# Patient Record
Sex: Female | Born: 1960 | Race: White | Hispanic: No | Marital: Married | Smoking: Never smoker
Health system: Southern US, Community
[De-identification: ages and names within clinical notes are randomized; demographics above are authoritative.]

## PROBLEM LIST (undated history)

## (undated) DIAGNOSIS — C50919 Malignant neoplasm of unspecified site of unspecified female breast: Secondary | ICD-10-CM

## (undated) DIAGNOSIS — D649 Anemia, unspecified: Secondary | ICD-10-CM

## (undated) DIAGNOSIS — U071 COVID-19: Secondary | ICD-10-CM

## (undated) DIAGNOSIS — N6001 Solitary cyst of right breast: Secondary | ICD-10-CM

## (undated) DIAGNOSIS — R7303 Prediabetes: Secondary | ICD-10-CM

## (undated) DIAGNOSIS — E785 Hyperlipidemia, unspecified: Secondary | ICD-10-CM

## (undated) DIAGNOSIS — Z9221 Personal history of antineoplastic chemotherapy: Secondary | ICD-10-CM

## (undated) DIAGNOSIS — C50911 Malignant neoplasm of unspecified site of right female breast: Secondary | ICD-10-CM

## (undated) HISTORY — PX: DILATION AND CURETTAGE OF UTERUS: SHX78

## (undated) HISTORY — DX: Malignant neoplasm of unspecified site of unspecified female breast: C50.919

## (undated) HISTORY — DX: Anemia, unspecified: D64.9

## (undated) HISTORY — PX: ENDOMETRIAL ABLATION: SHX621

## (undated) HISTORY — DX: Solitary cyst of right breast: N60.01

## (undated) HISTORY — DX: Hyperlipidemia, unspecified: E78.5

## (undated) HISTORY — DX: Prediabetes: R73.03

---
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
## 2009-06-05 HISTORY — PX: COLONOSCOPY: SHX174

## 2009-09-03 HISTORY — PX: ENDOMETRIAL BIOPSY: SHX622

## 2009-10-15 ENCOUNTER — Ambulatory Visit: Payer: Self-pay | Admitting: Unknown Physician Specialty

## 2009-11-03 HISTORY — PX: BREAST CYST ASPIRATION: SHX578

## 2009-11-16 ENCOUNTER — Ambulatory Visit: Payer: Self-pay | Admitting: Unknown Physician Specialty

## 2009-11-23 ENCOUNTER — Ambulatory Visit: Payer: Self-pay | Admitting: Unknown Physician Specialty

## 2011-06-16 ENCOUNTER — Ambulatory Visit: Payer: Self-pay | Admitting: Unknown Physician Specialty

## 2015-01-08 ENCOUNTER — Other Ambulatory Visit: Payer: Self-pay | Admitting: Obstetrics and Gynecology

## 2015-01-08 DIAGNOSIS — N63 Unspecified lump in unspecified breast: Secondary | ICD-10-CM

## 2015-01-19 ENCOUNTER — Ambulatory Visit: Admission: RE | Admit: 2015-01-19 | Payer: Self-pay | Source: Ambulatory Visit

## 2015-01-19 ENCOUNTER — Ambulatory Visit
Admission: RE | Admit: 2015-01-19 | Discharge: 2015-01-19 | Disposition: A | Discharge status: Home / Self Care | Payer: 59 | Source: Ambulatory Visit | Attending: Obstetrics and Gynecology | Admitting: Obstetrics and Gynecology

## 2015-01-19 DIAGNOSIS — N6001 Solitary cyst of right breast: Secondary | ICD-10-CM | POA: Insufficient documentation

## 2015-01-19 DIAGNOSIS — N63 Unspecified lump in unspecified breast: Secondary | ICD-10-CM

## 2017-03-14 ENCOUNTER — Ambulatory Visit (INDEPENDENT_AMBULATORY_CARE_PROVIDER_SITE_OTHER): Payer: 59 | Admitting: Obstetrics and Gynecology

## 2017-03-14 ENCOUNTER — Encounter: Payer: Self-pay | Admitting: Obstetrics and Gynecology

## 2017-03-14 VITALS — BP 100/62 | HR 72 | Ht 63.5 in | Wt 194.0 lb

## 2017-03-14 DIAGNOSIS — E7800 Pure hypercholesterolemia, unspecified: Secondary | ICD-10-CM | POA: Insufficient documentation

## 2017-03-14 DIAGNOSIS — Z713 Dietary counseling and surveillance: Secondary | ICD-10-CM | POA: Diagnosis not present

## 2017-03-14 DIAGNOSIS — N816 Rectocele: Secondary | ICD-10-CM | POA: Diagnosis not present

## 2017-03-14 DIAGNOSIS — N6311 Unspecified lump in the right breast, upper outer quadrant: Secondary | ICD-10-CM

## 2017-03-14 DIAGNOSIS — N6001 Solitary cyst of right breast: Secondary | ICD-10-CM

## 2017-03-14 DIAGNOSIS — Z01419 Encounter for gynecological examination (general) (routine) without abnormal findings: Secondary | ICD-10-CM

## 2017-03-14 DIAGNOSIS — R7303 Prediabetes: Secondary | ICD-10-CM | POA: Insufficient documentation

## 2017-03-14 DIAGNOSIS — N8110 Cystocele, unspecified: Secondary | ICD-10-CM

## 2017-03-14 DIAGNOSIS — D649 Anemia, unspecified: Secondary | ICD-10-CM | POA: Insufficient documentation

## 2017-03-14 NOTE — Progress Notes (Signed)
PCP: Patient, No Pcp Per   Chief Complaint  Patient presents with  . Gynecologic Exam    R Breast cyst has grown/Wellness Screening appeal form  . Gynecologic Exam    Last pap 02/22/16 Normal    HPI:      Ms. Ashley Molina is a 56 y.o. No obstetric history on file. who LMP was No LMP recorded. Patient is postmenopausal., presents today for her annual examination.  Her menses are absent due to menopause. She does not have intermenstrual bleeding. She does not have vasomotor sx.   Sex activity: not active. She does not have vaginal dryness.  Last Pap: February 22, 2016  Results were: no abnormalities /neg HPV DNA.   Last mammogram: January 19, 2015  Results were: normal--routine follow-up in 12 months. She has a hx of RT breast cyst that has been aspirated in the past with Dr. Bary Castilla. Cyst has gotten bigger in the past yr and sometimes aches.   There is no FH of breast cancer. There is no FH of ovarian cancer. The patient does do self-breast exams.  Colonoscopy: colonoscopy 7 years ago without abnormalities. . Repeat due after 10 years.   Tobacco use: The patient denies current or previous tobacco use. Alcohol use: none Exercise: moderately active  She does get adequate calcium and Vitamin D in her diet.  She has gained 12 # since last appt and needs [facility] BMI attestation form completed. Pt walks at least 10,000 steps daily and journals her food regularly with wt watchers. She travels all the time for work which makes it hard to eat healthy.   Labs through work.    Past Medical History:  Diagnosis Date  . Anemia   . Breast cyst, right    aspirated by Dr. Bary Castilla  . Hyperlipidemia   . Pre-diabetes     Past Surgical History:  Procedure Laterality Date  . BREAST CYST ASPIRATION Right 2011   Dr. Bary Castilla did FNA  . COLONOSCOPY  2011  . DILATION AND CURETTAGE OF UTERUS     X3  . ENDOMETRIAL ABLATION    . ENDOMETRIAL BIOPSY  09/2009    Family History    Problem Relation Age of Onset  . Melanoma Maternal Grandmother 30  . Brain cancer Maternal Grandfather 65  . Melanoma Other 82    Social History   Social History  . Marital status: Married    Spouse name: N/A  . Number of children: N/A  . Years of education: N/A   Occupational History  . Not on file.   Social History Main Topics  . Smoking status: Never Smoker  . Smokeless tobacco: Never Used  . Alcohol use 1.2 oz/week    2 Glasses of wine per week  . Drug use: No  . Sexual activity: Not Currently   Other Topics Concern  . Not on file   Social History Narrative  . No narrative on file    Current Meds  Medication Sig  . Cholecalciferol (VITAMIN D3) 10000 units TABS Take by mouth 1 day or 1 dose.  . Multiple Vitamin (MULTIVITAMIN) tablet Take 1 tablet by mouth daily.  Marland Kitchen omega-3 acid ethyl esters (LOVAZA) 1 g capsule Take by mouth 1 day or 1 dose.      ROS:  Review of Systems  Constitutional: Negative for fatigue, fever and unexpected weight change.  Respiratory: Negative for cough, shortness of breath and wheezing.   Cardiovascular: Negative for chest pain, palpitations and leg swelling.  Gastrointestinal: Negative for blood in stool, constipation, diarrhea, nausea and vomiting.  Endocrine: Negative for cold intolerance, heat intolerance and polyuria.  Genitourinary: Negative for dyspareunia, dysuria, flank pain, frequency, genital sores, hematuria, menstrual problem, pelvic pain, urgency, vaginal bleeding, vaginal discharge and vaginal pain.  Musculoskeletal: Negative for back pain, joint swelling and myalgias.  Skin: Negative for rash.  Neurological: Negative for dizziness, syncope, light-headedness, numbness and headaches.  Hematological: Negative for adenopathy.  Psychiatric/Behavioral: Negative for agitation, confusion, sleep disturbance and suicidal ideas. The patient is not nervous/anxious.      Objective: BP 100/62 (BP Location: Left Arm, Patient  Position: Sitting, Cuff Size: Normal)   Pulse 72   Ht 5' 3.5" (1.613 m)   Wt 194 lb (88 kg)   BMI 33.83 kg/m    Physical Exam  Constitutional: She is oriented to person, place, and time. She appears well-developed and well-nourished.  Genitourinary: Vagina normal and uterus normal. There is no rash or tenderness on the right labia. There is no rash or tenderness on the left labia. No erythema or tenderness in the vagina. No vaginal discharge found. Right adnexum does not display mass and does not display tenderness. Left adnexum does not display mass and does not display tenderness. Cervix does not exhibit motion tenderness or polyp. Uterus is not enlarged or tender.  Genitourinary Comments: GRADE 2 CYSTOCELE VALSALVA; GRADE 1 WITHOUT VALSALVA; GRADE 1 RECTOCELE  Neck: Normal range of motion. No thyromegaly present.  Cardiovascular: Normal rate, regular rhythm and normal heart sounds.   No murmur heard. Pulmonary/Chest: Effort normal and breath sounds normal. Right breast exhibits mass. Right breast exhibits no nipple discharge, no skin change and no tenderness. Left breast exhibits no mass, no nipple discharge, no skin change and no tenderness.    RT BREAST 9:00-1:00 WITH LARGE, FIRM, NT MASS (AREA OF BREAST CYST PER PT REPORT); ~6 X 8 CM  Abdominal: Soft. There is no tenderness. There is no guarding.  Musculoskeletal: Normal range of motion.  Neurological: She is alert and oriented to person, place, and time. No cranial nerve deficit.  Psychiatric: She has a normal mood and affect. Her behavior is normal.  Vitals reviewed.   Assessment/Plan:  Encounter for annual routine gynecological examination  Solitary cyst of breast, right - Given size and change per pt, check dx mammo and u/s. REfer back to Dr. Bary Castilla for further eval/mgmt. - Plan: US BREAST LTD UNI RIGHT INC AXILLA, MM DIAG BREAST TOMO BILATERAL, Ambulatory referral to General Surgery  Mass of upper outer quadrant of right  breast - Plan: US BREAST LTD UNI RIGHT INC AXILLA, MM DIAG BREAST TOMO BILATERAL, Ambulatory referral to General Surgery  Weight loss counseling, encounter for - Increase exercise intensity/MyFitness Pal/40 g carbs daily. [facility] form completed. F/u prn.   Cystocele with rectocele - No urin/rectal sx. Not bothersome to pt. F/u prn.          GYN counsel breast self exam, menopause, adequate intake of calcium and vitamin D, diet and exercise    F/U  Return in about 1 year (around 03/14/2018).  Alicia B. Copland, PA-C 03/14/2017 3:02 PM

## 2017-03-15 ENCOUNTER — Encounter: Payer: Self-pay | Admitting: *Deleted

## 2017-03-15 ENCOUNTER — Telehealth: Payer: Self-pay | Admitting: General Surgery

## 2017-03-15 ENCOUNTER — Telehealth: Payer: Self-pay | Admitting: Obstetrics and Gynecology

## 2017-03-15 NOTE — Telephone Encounter (Signed)
Patient is aware of appointment at [facility] on Tuesday, 03/20/17 @ 2:20pm but will be out of town and was given [place]'s phone# to reschedule. Patient is aware that Dr Dwyane Luo office will contact her directly to schedule the referral appointment.

## 2017-03-15 NOTE — Telephone Encounter (Signed)
I SPOKE TO PATIENT ABOUT SCHEDULING AN APPOINTMENT WITH DR BYRNETT (OLD PT 11-29-09)FOR RT BR CYST UOQ.CURRENTLY SCHEDULED FOR MAMMO& U/S [id number].(FRT'M BY ALICIA COPLAND/DR ROBERT HARRIS) THE PATIENT IS GOING OUT OF TOWN & NEEDS TO RESCHEDULE MAMMO.SHE WILL CALL us BACK TO SCHEDULE APPOINTMENT WITH DR BYRNETT.

## 2017-03-20 ENCOUNTER — Other Ambulatory Visit: Payer: 59

## 2017-03-23 ENCOUNTER — Ambulatory Visit
Admission: RE | Admit: 2017-03-23 | Discharge: 2017-03-23 | Disposition: A | Discharge status: Home / Self Care | Payer: 59 | Source: Ambulatory Visit | Attending: Obstetrics and Gynecology | Admitting: Obstetrics and Gynecology

## 2017-03-23 ENCOUNTER — Other Ambulatory Visit: Payer: Self-pay | Admitting: Obstetrics and Gynecology

## 2017-03-23 DIAGNOSIS — N6001 Solitary cyst of right breast: Secondary | ICD-10-CM

## 2017-03-23 DIAGNOSIS — N6311 Unspecified lump in the right breast, upper outer quadrant: Secondary | ICD-10-CM

## 2017-03-26 ENCOUNTER — Telehealth: Payer: Self-pay | Admitting: Obstetrics and Gynecology

## 2017-03-26 ENCOUNTER — Other Ambulatory Visit: Payer: Self-pay | Admitting: Obstetrics and Gynecology

## 2017-03-26 DIAGNOSIS — N6310 Unspecified lump in the right breast, unspecified quadrant: Secondary | ICD-10-CM

## 2017-03-26 DIAGNOSIS — R928 Other abnormal and inconclusive findings on diagnostic imaging of breast: Secondary | ICD-10-CM

## 2017-03-26 NOTE — Telephone Encounter (Signed)
Ok

## 2017-03-26 NOTE — Telephone Encounter (Signed)
Pt is calling back to confirm appointment with Dr. Bary Castilla tomorrow 03/27/17

## 2017-03-26 NOTE — Telephone Encounter (Signed)
[place]. Pt has Birads 5 mammo. Originally had appt with Dr. Bary Castilla 10/29 but can be seen 10/23 instead. Radiologist already told pt of need for bx.

## 2017-03-27 ENCOUNTER — Other Ambulatory Visit: Payer: Self-pay | Admitting: General Surgery

## 2017-03-27 ENCOUNTER — Inpatient Hospital Stay: Payer: Self-pay

## 2017-03-27 ENCOUNTER — Encounter: Payer: Self-pay | Admitting: General Surgery

## 2017-03-27 ENCOUNTER — Ambulatory Visit (INDEPENDENT_AMBULATORY_CARE_PROVIDER_SITE_OTHER): Payer: 59 | Admitting: General Surgery

## 2017-03-27 VITALS — BP 104/60 | HR 65 | Resp 12 | Ht 63.0 in | Wt 197.0 lb

## 2017-03-27 DIAGNOSIS — N6311 Unspecified lump in the right breast, upper outer quadrant: Secondary | ICD-10-CM

## 2017-03-27 HISTORY — PX: BREAST BIOPSY: SHX20

## 2017-03-27 NOTE — Progress Notes (Signed)
Patient ID: [name], female   DOB: 1960-07-14, 56 y.o.   MRN: [id number]  Chief Complaint  Patient presents with  . Other    HPI Ashley Molina is a 56 y.o. female who presents for a breast evaluation. The most recent mammogram was done on 03/23/2017 . She had bilateral breast ultrasound 03-23-17. Patient does perform regular self breast checks and gets regular mammograms done. She did not have her mammogram last year.  She states she had a known cyst in the right breast there for several years that Ashley Molina has been watching. She states it has gotten larger over the summer.Denies  discomfort. She states she can see the area where the lump is for about 2 months. Cyst aspiration right breast was June 2011. She works for [employer].  HPI  Past Medical History:  Diagnosis Date  . Anemia   . Breast cyst, right    aspirated by Dr. Bary Castilla  . Hyperlipidemia   . Pre-diabetes     Past Surgical History:  Procedure Laterality Date  . BREAST CYST ASPIRATION Right 11/2009   Dr. Bary Castilla did FNA  . COLONOSCOPY  2011  . DILATION AND CURETTAGE OF UTERUS     X3  . ENDOMETRIAL ABLATION    . ENDOMETRIAL BIOPSY  09/2009    Family History  Problem Relation Age of Onset  . Melanoma Maternal Grandmother 13  . Brain cancer Maternal Grandfather 39  . Melanoma Other 58    Social History Social History  Substance Use Topics  . Smoking status: Never Smoker  . Smokeless tobacco: Never Used  . Alcohol use 1.2 oz/week    2 Glasses of wine per week    Allergies  Allergen Reactions  . Penicillins Rash    Current Outpatient Prescriptions  Medication Sig Dispense Refill  . Cholecalciferol (VITAMIN D3) 10000 units TABS Take by mouth 1 day or 1 dose.    . omega-3 acid ethyl esters (LOVAZA) 1 g capsule Take by mouth 1 day or 1 dose.    . vitamin E 400 UNIT capsule Take 400 Units by mouth daily.     No current facility-administered medications for this visit.     Review  of Systems Review of Systems  Constitutional: Negative.   Cardiovascular: Negative.     Blood pressure 104/60, pulse 65, resp. rate 12, height 5' 3"  (1.6 m), weight 197 lb (89.4 kg).  Physical Exam Physical Exam  Constitutional: She is oriented to person, place, and time. She appears well-developed and well-nourished.  HENT:  Mouth/Throat: Oropharynx is clear and moist.  Eyes: Conjunctivae are normal. No scleral icterus.  Neck: Neck supple.  Cardiovascular: Normal rate, regular rhythm and normal heart sounds.   Pulmonary/Chest: Effort normal and breath sounds normal. Right breast exhibits mass. Right breast exhibits no inverted nipple, no nipple discharge, no skin change and no tenderness. Left breast exhibits no inverted nipple, no mass, no nipple discharge, no skin change and no tenderness.    6-7 cm mass right breast UOQ  Lymphadenopathy:    She has no cervical adenopathy.    She has axillary adenopathy.       Right: No supraclavicular adenopathy present.  Right axillary node  Neurological: She is alert and oriented to person, place, and time.  Skin: Skin is warm and dry.  Psychiatric: Her behavior is normal.    Data Reviewed 03/23/2017 mammogram and ultrasound reviewed. Comparison to 01/19/2015 studies completed.  New dominant mass in the upper-outer quadrant of  the right breast with a stable breast cyst. Lymphadenopathy. BIRAD-5.  Indications for biopsy of both the dominant mass in the right breast as well as the enlarged axillary lymph node were reviewed with the patient and she was amenable to proceed.  Examination of the axilla showed at least 1 dominant lymph node measuring up to 2.16 cm in diameter. The area was infiltrated with 10 mL of 0.5% Xylocaine with 0.25% Marcaine with 1 200,000 of epinephrine. A 14-gauge spring-loaded core biopsy device was used and 3 core samples were obtained. A postbiopsy clip was placed. This was completed in a transverse orientation from  posterior to anterior. No bleeding noted.  The right breast mass is a spiculated hypoechoic mass measuring at least 3.69 x 3.74 x 7.2 cm.  The patient received an additional 10 mL of 0.5% Xylocaine with 0.25% Marcaine with 1-200,000 epinephrine. Again using a 14-gauge core biopsy device, and using a lateral to medial approach 4 core samples were obtained after skin incision with an 11-gauge blade through various locations in the lesion. A postbiopsy clip was placed. There was about 10 mL of bleeding that was controlled with direct pressure. Both skin defects were closed with benzoin and Steri-Strips. Telfa and Tegaderm dressings applied.  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 Likely nodal disease.    Plan    Indication for medical oncology evaluation and likely recommendation for neoadjuvant chemotherapy reviewed.  Baseline laboratories will be obtained first thing in the morning.  The patient will be contacted by phone when pathology results are available.  Postbiopsy wound care was reviewed.     Follow up pending pathology. Appointment with medical oncology  CBC, Comp Met Panel and Ca 27.29 to be drawn today at [facility]   HPI, Physical Exam, Assessment and Plan have been scribed under the direction and in the presence of Robert Bellow, MD. Karie Fetch, RN  I have completed the exam and reviewed the above documentation for accuracy and completeness.  I agree with the above.  Haematologist has been used and any errors in dictation or transcription are unintentional.  Hervey Ard, M.D., F.A.C.S.  Robert Bellow 03/27/2017, 7:29 PM

## 2017-03-27 NOTE — Patient Instructions (Signed)

## 2017-03-29 LAB — PATHOLOGY

## 2017-03-29 LAB — CBC WITH DIFFERENTIAL/PLATELET
BASOS: 1 %
Basophils Absolute: 0.1 10*3/uL (ref 0.0–0.2)
EOS (ABSOLUTE): 0.1 10*3/uL (ref 0.0–0.4)
EOS: 2 %
HEMATOCRIT: 38.2 % (ref 34.0–46.6)
Hemoglobin: 12.9 g/dL (ref 11.1–15.9)
Immature Grans (Abs): 0 10*3/uL (ref 0.0–0.1)
Immature Granulocytes: 0 %
LYMPHS ABS: 1.1 10*3/uL (ref 0.7–3.1)
Lymphs: 24 %
MCH: 27.3 pg (ref 26.6–33.0)
MCHC: 33.8 g/dL (ref 31.5–35.7)
MCV: 81 fL (ref 79–97)
MONOS ABS: 0.4 10*3/uL (ref 0.1–0.9)
Monocytes: 10 %
Neutrophils Absolute: 2.7 10*3/uL (ref 1.4–7.0)
Neutrophils: 63 %
Platelets: 245 10*3/uL (ref 150–379)
RBC: 4.72 x10E6/uL (ref 3.77–5.28)
RDW: 14.3 % (ref 12.3–15.4)
WBC: 4.3 10*3/uL (ref 3.4–10.8)

## 2017-03-29 LAB — COMPREHENSIVE METABOLIC PANEL
A/G RATIO: 1.7 (ref 1.2–2.2)
ALK PHOS: 89 IU/L (ref 39–117)
ALT: 22 IU/L (ref 0–32)
AST: 19 IU/L (ref 0–40)
Albumin: 4.3 g/dL (ref 3.5–5.5)
BUN/Creatinine Ratio: 18 (ref 9–23)
BUN: 15 mg/dL (ref 6–24)
Bilirubin Total: 0.4 mg/dL (ref 0.0–1.2)
CO2: 22 mmol/L (ref 20–29)
Calcium: 9.3 mg/dL (ref 8.7–10.2)
Chloride: 106 mmol/L (ref 96–106)
Creatinine, Ser: 0.82 mg/dL (ref 0.57–1.00)
GFR calc Af Amer: 92 mL/min/{1.73_m2} (ref >59)
GFR calc non Af Amer: 80 mL/min/{1.73_m2} (ref >59)
GLOBULIN, TOTAL: 2.6 g/dL (ref 1.5–4.5)
Glucose: 102 mg/dL — ABNORMAL HIGH (ref 65–99)
POTASSIUM: 4.1 mmol/L (ref 3.5–5.2)
SODIUM: 140 mmol/L (ref 134–144)
Total Protein: 6.9 g/dL (ref 6.0–8.5)

## 2017-03-29 LAB — CANCER ANTIGEN 27.29: CAN 27.29: 15.8 U/mL (ref 0.0–38.6)

## 2017-03-30 ENCOUNTER — Telehealth: Payer: Self-pay | Admitting: *Deleted

## 2017-03-30 NOTE — Telephone Encounter (Signed)
Returning your phone call.

## 2017-04-01 ENCOUNTER — Telehealth: Payer: Self-pay | Admitting: General Surgery

## 2017-04-02 ENCOUNTER — Ambulatory Visit: Payer: 59 | Admitting: General Surgery

## 2017-04-02 NOTE — Telephone Encounter (Signed)
Message left with call back number.

## 2017-04-02 NOTE — Progress Notes (Signed)
Hematology/Oncology Consult note [facility] Telephone:([phone] [phone]  Patient Care Team: Patient, No Pcp Per as PCP - General (General Practice) Byrnett, Forest Gleason, MD (General Surgery) Gae Dry, MD as Referring Physician (Obstetrics and Gynecology)   Name of the patient: Ashley Molina  [id number]  10/28/1960    Reason for referral- breast mass   Referring physician- Dr. Bary Castilla  Date of visit: 04/02/17   History of presenting illness- 1.  Patient is a 56 year old postmenopausal female who had a routine screening mammogram on 03/23/2017 which showed a highly suspicious palpable solid mass in the 10 o'clock position.  Mass measures at least 5 cm in maximum diameter for right axillary lymph nodes have sonographic features suspicious for metastatic disease.  No evidence of malignancy was noted in the left breast.  This was followed by an ultrasound of the right breast including the right axilla which confirmed the same findings.Targeted ultrasound is performed, showing a markedly irregular and hypoechoic mass centered at 10 o'clock position approximately 7 cm from the nipple estimated to be at least 5.2 x 4.4 x 3.4 cm. Small areas of vascular flow are detected within the mass. At 10 o'clock position 9 cm from the nipple again noted is a simple cyst measuring approximately 2.4 cm greatest diameter. Skin thickness of the medial and lateral of right breast measures approximately 4 mm.  Ultrasound of the right axilla demonstrates 4 suspicious lymph nodes with hypoechoic thickened cortices. The largest of these lymph nodes measures 2.2 x 1.2 x 1.0 cm and has a cortical thickness of 4-5 mm.  Patient underwent core biopsy of the right breast mass.  2.  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  Right axillary lymph node biopsy was also consistent with metastatic carcinoma.  ER PR and HER-2 status is currently pending.  Patient has  been seen by Dr. Bary Castilla and has been referred to [place] for consideration of adjuvant chemotherapy  3. Patient is G3P3L3. Menarche at the age of 23. Menopause at 30. No priro h/o breast biopsies. No family h/o breast cancer. She was known to have right breast cyst being followed by Dr. Bary Castilla. She noticed this amss sometime over the summer and feels this has grown since then. She is healthy and has no significant comorbidities  ECOG PS- 0  Pain scale- 0   Review of systems- Review of Systems  Constitutional: Negative for chills, fever, malaise/fatigue and weight loss.  HENT: Negative for congestion, ear discharge and nosebleeds.   Eyes: Negative for blurred vision.  Respiratory: Negative for cough, hemoptysis, sputum production, shortness of breath and wheezing.   Cardiovascular: Negative for chest pain, palpitations, orthopnea and claudication.  Gastrointestinal: Negative for abdominal pain, blood in stool, constipation, diarrhea, heartburn, melena, nausea and vomiting.  Genitourinary: Negative for dysuria, flank pain, frequency, hematuria and urgency.  Musculoskeletal: Negative for back pain, joint pain and myalgias.  Skin: Negative for rash.  Neurological: Negative for dizziness, tingling, focal weakness, seizures, weakness and headaches.  Endo/Heme/Allergies: Does not bruise/bleed easily.  Psychiatric/Behavioral: Negative for depression and suicidal ideas. The patient does not have insomnia.     Allergies  Allergen Reactions  . Penicillins Rash    Patient Active Problem List   Diagnosis Date Noted  . Anemia 03/14/2017  . Solitary cyst of breast, right 03/14/2017  . Pre-diabetes 03/14/2017  . Hypercholesterolemia 03/14/2017  . Mass of upper outer quadrant of right breast 03/14/2017     Past Medical History:  Diagnosis  Date  . Anemia   . Breast cyst, right    aspirated by Dr. Bary Castilla  . Hyperlipidemia   . Pre-diabetes      Past Surgical History:  Procedure Laterality  Date  . BREAST CYST ASPIRATION Right 11/2009   Dr. Bary Castilla did FNA  . COLONOSCOPY  2011  . DILATION AND CURETTAGE OF UTERUS     X3  . ENDOMETRIAL ABLATION    . ENDOMETRIAL BIOPSY  09/2009    Social History   Social History  . Marital status: Married    Spouse name: N/A  . Number of children: N/A  . Years of education: N/A   Occupational History  . Not on file.   Social History Main Topics  . Smoking status: Never Smoker  . Smokeless tobacco: Never Used  . Alcohol use 1.2 oz/week    2 Glasses of wine per week  . Drug use: No  . Sexual activity: Not Currently   Other Topics Concern  . Not on file   Social History Narrative  . No narrative on file     Family History  Problem Relation Age of Onset  . Melanoma Maternal Grandmother 14  . Brain cancer Maternal Grandfather 27  . Melanoma Other 90     Current Outpatient Prescriptions:  .  Biotin 2500 MCG CAPS, Take 5,000 mcg/day by mouth daily., Disp: , Rfl:  .  Cholecalciferol (VITAMIN D3) 10000 units TABS, Take by mouth 1 day or 1 dose., Disp: , Rfl:  .  Multiple Vitamins-Minerals (EMERGEN-C IMMUNE PLUS PO), Take 1 packet by mouth 1 day or 1 dose. With energy, Disp: , Rfl:  .  omega-3 acid ethyl esters (LOVAZA) 1 g capsule, Take by mouth 1 day or 1 dose., Disp: , Rfl:  .  vitamin E 400 UNIT capsule, Take 400 Units by mouth daily., Disp: , Rfl:    Physical exam:  Vitals:   04/03/17 1435  BP: (!) 139/91  Pulse: 74  Resp: 18  Temp: 98.8 F (37.1 C)  TempSrc: Tympanic  Weight: 193 lb 3 oz (87.6 kg)   Physical Exam  Constitutional: She is oriented to person, place, and time and well-developed, well-nourished, and in no distress.  HENT:  Head: Normocephalic and atraumatic.  Eyes: Pupils are equal, round, and reactive to light. EOM are normal.  Neck: Normal range of motion.  Cardiovascular: Normal rate, regular rhythm and normal heart sounds.   Pulmonary/Chest: Effort normal and breath sounds normal.    Abdominal: Soft. Bowel sounds are normal.  Neurological: She is alert and oriented to person, place, and time.  Skin: Skin is warm and dry.   no palpable b/l axillary adenopathy. Palpable right breast mass in the upper outer quadrant about 5 cm in size    CMP Latest Ref Rng & Units 03/28/2017  Glucose 65 - 99 mg/dL 102(H)  BUN 6 - 24 mg/dL 15  Creatinine 0.57 - 1.00 mg/dL 0.82  Sodium 134 - 144 mmol/L 140  Potassium 3.5 - 5.2 mmol/L 4.1  Chloride 96 - 106 mmol/L 106  CO2 20 - 29 mmol/L 22  Calcium 8.7 - 10.2 mg/dL 9.3  Total Protein 6.0 - 8.5 g/dL 6.9  Total Bilirubin 0.0 - 1.2 mg/dL 0.4  Alkaline Phos 39 - 117 IU/L 89  AST 0 - 40 IU/L 19  ALT 0 - 32 IU/L 22   CBC Latest Ref Rng & Units 03/28/2017  WBC 3.4 - 10.8 x10E3/uL 4.3  Hemoglobin 11.1 - 15.9 g/dL  12.9  Hematocrit 34.0 - 46.6 % 38.2  Platelets 150 - 379 x10E3/uL 245    No images are attached to the encounter.  US Breast Complete Uni Right Inc Axilla  Result Date: 03/27/2017 Indications for biopsy of both the dominant mass in the right breast as well as the enlarged axillary lymph node were reviewed with the patient and she was amenable to proceed. Examination of the axilla showed at least 1 dominant lymph node measuring up to 2.16 cm in diameter. The area was infiltrated with 10 mL of 0.5% Xylocaine with 0.25% Marcaine with 1 200,000 of epinephrine. A 14-gauge spring-loaded core biopsy device was used and 3 core samples were obtained. A postbiopsy clip was placed. This was completed in a transverse orientation from posterior to anterior. No bleeding noted. The right breast mass is a spiculated hypoechoic mass measuring at least 3.69 x 3.74 x 7.2 cm. The patient received an additional 10 mL of 0.5% Xylocaine with 0.25% Marcaine with 1-200,000 epinephrine. Again using a 14-gauge core biopsy device, and using a lateral to medial approach 4 core samples were obtained after skin incision with an 11-gauge blade through various  locations in the lesion. A postbiopsy clip was placed. There was about 10 mL of bleeding that was controlled with direct pressure. Both skin defects were closed with benzoin and Steri-Strips. Telfa and Tegaderm dressings applied.   US Breast Ltd Uni Left Inc Axilla  Result Date: 03/23/2017 CLINICAL DATA:  56 year old patient presents for evaluation of an enlarging mass in the upper-outer quadrant of the right breast that she noticed began increasing in size this summer. She has a known palpable simple benign cyst in the upper-outer quadrant of the right breast previously evaluated by mammogram and ultrasound. This cyst was last evaluated in August of 2016. EXAM: 2D DIGITAL DIAGNOSTIC BILATERAL MAMMOGRAM WITH CAD AND ADJUNCT TOMO ULTRASOUND BILATERAL BREAST COMPARISON:  Previous exam(s). ACR Breast Density Category b: There are scattered areas of fibroglandular density. FINDINGS: Metallic skin marker was placed over the palpable area of concern in the right breast. There is a dominant spiculated mass in the upper-outer quadrant of the right breast that is adjacent to but separate from the stable circumscribed previously documented cyst in this region of the right breast. The spiculated mass measures at least 5 cm maximum transverse diameter and craniocaudal span. Anterior to posterior, the mass measures approximately 4.5 cm. There is new mild skin thickening of the right breast. No suspicious microcalcifications on the right. Question cortical thickening of a right axillary lymph node. A small asymmetry in the upper-outer quadrant of the left breast was evaluated with additional spot compression and 90 degree lateral view and has appearances consistent with normal fibroglandular tissue. No mass, distortion, or suspicious microcalcification is identified in the left breast. Mammographic images were processed with CAD. On physical exam, there is a mildly protuberant and very firm palpable mass in the upper-outer  quadrant of the right breast centered at 10 o'clock position approximately 7 cm from the nipple. On physical exam the mass measures approximately 6-7 cm. There is evidence of slight skin thickening in the medial and lateral aspects of the right breast. The nipple appears normal. I do not palpate any discrete lymphadenopathy in the right axilla. Targeted ultrasound is performed, showing a markedly irregular and hypoechoic mass centered at 10 o'clock position approximately 7 cm from the nipple estimated to be at least 5.2 x 4.4 x 3.4 cm. Small areas of vascular flow are detected within  the mass. At 10 o'clock position 9 cm from the nipple again noted is a simple cyst measuring approximately 2.4 cm greatest diameter. Skin thickness of the medial and lateral of right breast measures approximately 4 mm. Ultrasound of the right axilla demonstrates 4 suspicious lymph nodes with hypoechoic thickened cortices. The largest of these lymph nodes measures 2.2 x 1.2 x 1.0 cm and has a cortical thickness of 4-5 mm. IMPRESSION: 1. Highly suspicious palpable solid mass centered in the 10 o'clock position of the right breast 7 cm from the nipple. The size of the mass is likely best estimated by the mammogram, and measures at least 5 cm maximum diameter. There is diffuse mild skin thickening of the right breast. 2. Four right axillary lymph nodes have sonographic features suspicious for metastatic disease. 3. Chronic benign simple cyst in the upper-outer quadrant of the right breast. 4. No evidence of malignancy in the left breast. RECOMMENDATION: Ultrasound-guided biopsy is recommended of the suspicious palpable mass in the 10 o'clock position the right breast. Ultrasound-guided biopsy of 1 of the suspicious right axillary lymph nodes is recommended. I have discussed the findings and recommendations with the patient. Results were also provided in writing at the conclusion of the visit. If applicable, a reminder letter will be sent to  the patient regarding the next appointment. BI-RADS CATEGORY  5: Highly suggestive of malignancy. Electronically Signed   By: Curlene Dolphin M.D.   On: 03/23/2017 15:57   US Breast Ltd Uni Right Inc Axilla  Result Date: 03/23/2017 CLINICAL DATA:  56 year old patient presents for evaluation of an enlarging mass in the upper-outer quadrant of the right breast that she noticed began increasing in size this summer. She has a known palpable simple benign cyst in the upper-outer quadrant of the right breast previously evaluated by mammogram and ultrasound. This cyst was last evaluated in August of 2016. EXAM: 2D DIGITAL DIAGNOSTIC BILATERAL MAMMOGRAM WITH CAD AND ADJUNCT TOMO ULTRASOUND BILATERAL BREAST COMPARISON:  Previous exam(s). ACR Breast Density Category b: There are scattered areas of fibroglandular density. FINDINGS: Metallic skin marker was placed over the palpable area of concern in the right breast. There is a dominant spiculated mass in the upper-outer quadrant of the right breast that is adjacent to but separate from the stable circumscribed previously documented cyst in this region of the right breast. The spiculated mass measures at least 5 cm maximum transverse diameter and craniocaudal span. Anterior to posterior, the mass measures approximately 4.5 cm. There is new mild skin thickening of the right breast. No suspicious microcalcifications on the right. Question cortical thickening of a right axillary lymph node. A small asymmetry in the upper-outer quadrant of the left breast was evaluated with additional spot compression and 90 degree lateral view and has appearances consistent with normal fibroglandular tissue. No mass, distortion, or suspicious microcalcification is identified in the left breast. Mammographic images were processed with CAD. On physical exam, there is a mildly protuberant and very firm palpable mass in the upper-outer quadrant of the right breast centered at 10 o'clock position  approximately 7 cm from the nipple. On physical exam the mass measures approximately 6-7 cm. There is evidence of slight skin thickening in the medial and lateral aspects of the right breast. The nipple appears normal. I do not palpate any discrete lymphadenopathy in the right axilla. Targeted ultrasound is performed, showing a markedly irregular and hypoechoic mass centered at 10 o'clock position approximately 7 cm from the nipple estimated to be at least  5.2 x 4.4 x 3.4 cm. Small areas of vascular flow are detected within the mass. At 10 o'clock position 9 cm from the nipple again noted is a simple cyst measuring approximately 2.4 cm greatest diameter. Skin thickness of the medial and lateral of right breast measures approximately 4 mm. Ultrasound of the right axilla demonstrates 4 suspicious lymph nodes with hypoechoic thickened cortices. The largest of these lymph nodes measures 2.2 x 1.2 x 1.0 cm and has a cortical thickness of 4-5 mm. IMPRESSION: 1. Highly suspicious palpable solid mass centered in the 10 o'clock position of the right breast 7 cm from the nipple. The size of the mass is likely best estimated by the mammogram, and measures at least 5 cm maximum diameter. There is diffuse mild skin thickening of the right breast. 2. Four right axillary lymph nodes have sonographic features suspicious for metastatic disease. 3. Chronic benign simple cyst in the upper-outer quadrant of the right breast. 4. No evidence of malignancy in the left breast. RECOMMENDATION: Ultrasound-guided biopsy is recommended of the suspicious palpable mass in the 10 o'clock position the right breast. Ultrasound-guided biopsy of 1 of the suspicious right axillary lymph nodes is recommended. I have discussed the findings and recommendations with the patient. Results were also provided in writing at the conclusion of the visit. If applicable, a reminder letter will be sent to the patient regarding the next appointment. BI-RADS CATEGORY   5: Highly suggestive of malignancy. Electronically Signed   By: Curlene Dolphin M.D.   On: 03/23/2017 15:57   Mm Diag Breast Tomo Bilateral  Result Date: 03/23/2017 CLINICAL DATA:  56 year old patient presents for evaluation of an enlarging mass in the upper-outer quadrant of the right breast that she noticed began increasing in size this summer. She has a known palpable simple benign cyst in the upper-outer quadrant of the right breast previously evaluated by mammogram and ultrasound. This cyst was last evaluated in August of 2016. EXAM: 2D DIGITAL DIAGNOSTIC BILATERAL MAMMOGRAM WITH CAD AND ADJUNCT TOMO ULTRASOUND BILATERAL BREAST COMPARISON:  Previous exam(s). ACR Breast Density Category b: There are scattered areas of fibroglandular density. FINDINGS: Metallic skin marker was placed over the palpable area of concern in the right breast. There is a dominant spiculated mass in the upper-outer quadrant of the right breast that is adjacent to but separate from the stable circumscribed previously documented cyst in this region of the right breast. The spiculated mass measures at least 5 cm maximum transverse diameter and craniocaudal span. Anterior to posterior, the mass measures approximately 4.5 cm. There is new mild skin thickening of the right breast. No suspicious microcalcifications on the right. Question cortical thickening of a right axillary lymph node. A small asymmetry in the upper-outer quadrant of the left breast was evaluated with additional spot compression and 90 degree lateral view and has appearances consistent with normal fibroglandular tissue. No mass, distortion, or suspicious microcalcification is identified in the left breast. Mammographic images were processed with CAD. On physical exam, there is a mildly protuberant and very firm palpable mass in the upper-outer quadrant of the right breast centered at 10 o'clock position approximately 7 cm from the nipple. On physical exam the mass  measures approximately 6-7 cm. There is evidence of slight skin thickening in the medial and lateral aspects of the right breast. The nipple appears normal. I do not palpate any discrete lymphadenopathy in the right axilla. Targeted ultrasound is performed, showing a markedly irregular and hypoechoic mass centered at 10 o'clock  position approximately 7 cm from the nipple estimated to be at least 5.2 x 4.4 x 3.4 cm. Small areas of vascular flow are detected within the mass. At 10 o'clock position 9 cm from the nipple again noted is a simple cyst measuring approximately 2.4 cm greatest diameter. Skin thickness of the medial and lateral of right breast measures approximately 4 mm. Ultrasound of the right axilla demonstrates 4 suspicious lymph nodes with hypoechoic thickened cortices. The largest of these lymph nodes measures 2.2 x 1.2 x 1.0 cm and has a cortical thickness of 4-5 mm. IMPRESSION: 1. Highly suspicious palpable solid mass centered in the 10 o'clock position of the right breast 7 cm from the nipple. The size of the mass is likely best estimated by the mammogram, and measures at least 5 cm maximum diameter. There is diffuse mild skin thickening of the right breast. 2. Four right axillary lymph nodes have sonographic features suspicious for metastatic disease. 3. Chronic benign simple cyst in the upper-outer quadrant of the right breast. 4. No evidence of malignancy in the left breast. RECOMMENDATION: Ultrasound-guided biopsy is recommended of the suspicious palpable mass in the 10 o'clock position the right breast. Ultrasound-guided biopsy of 1 of the suspicious right axillary lymph nodes is recommended. I have discussed the findings and recommendations with the patient. Results were also provided in writing at the conclusion of the visit. If applicable, a reminder letter will be sent to the patient regarding the next appointment. BI-RADS CATEGORY  5: Highly suggestive of malignancy. Electronically Signed    By: Curlene Dolphin M.D.   On: 03/23/2017 15:57    Assessment and plan- Patient is a 56 y.o. female with newly diagnosed right breast invasive mammary carcinoma atleast Stage III A T3N1Mx ER PR her 2 pending  I discussed the results of mammogram and pathology with the patient in detail. Patient has locally advanced stleast Stage III breast cancer. She will need PET/CT scan to rule out metastatic disease.   If she does not have evidence of distant metastatic disease, she would warrant neoadjuvant chemotherapy given the extent of disease.   For ER PR positive her 2 negative disease - I would favor doing dose dense doxorubicin and cytoxan Q2 weeks IV for 4 cycles followed by 12 weekly cycles of taxol. I explained to the patient the risks and benefits of chemotherapy including all but not limited to nausea, vomiting, low blood counts and risk of infection and hospitalization. Risk of cardiotoxicity associated with doxorubicin. Patient understands and agrees to proceed as planned.   If patient has ER PR negative her 2 negative disease- I would do same regimen as above but add carboplatin AUC 2 to 12 weekly cycles of taxol  If patient has her 2 positive disease- I would favor doing TCHP- Taxotere carboplatin Herceptin and protect IV every 3 weeks for 6 cycles to be given before surgery. I discussed the risks and benefits of Taxotere and carboplatin including all but not limited to nausea, vomiting, fatigue, risk of low blood counts, risk of peripheral neuropathy associated with docetaxel and risk of infusion reaction with both carboplatin and docetaxel. Risk of hair loss which at times can be irreversible with docetaxel. Risks and benefits of Herceptin and projectile including all but not limited to rash, diarrhea and cardiotoxicity.  Patient understands and agrees to proceed with neoadjuavnt chemotherapy which will depend on ER PR and her 2 results.  We will proceed with baseline MUGA scan. Dr. Bary Castilla  will help [place]  with port placement after imaging studies are completed.   I will see her back in 1 weeks time to discuss pathology and imaging results and definitive management   Total face to face encounter time for this patient visit was 45 min. >50% of the time was  spent in counseling and coordination of care.      Thank you for this kind referral and the opportunity to participate in the care of this patient   Visit Diagnosis 1. Malignant neoplasm of upper-outer quadrant of right female breast, unspecified estrogen receptor status ([place])     Dr. Randa Evens, MD, MPH [place] at [facility] Pager- [phone] 04/03/2017 4:31 PM

## 2017-04-03 ENCOUNTER — Telehealth: Payer: Self-pay | Admitting: Oncology

## 2017-04-03 ENCOUNTER — Telehealth: Payer: Self-pay | Admitting: *Deleted

## 2017-04-03 ENCOUNTER — Other Ambulatory Visit: Payer: Self-pay | Admitting: *Deleted

## 2017-04-03 ENCOUNTER — Inpatient Hospital Stay: Payer: 59 | Attending: Oncology | Admitting: Oncology

## 2017-04-03 ENCOUNTER — Encounter: Payer: Self-pay | Admitting: Oncology

## 2017-04-03 VITALS — BP 139/91 | HR 74 | Temp 98.8°F | Resp 18 | Wt 193.2 lb

## 2017-04-03 DIAGNOSIS — Z79899 Other long term (current) drug therapy: Secondary | ICD-10-CM

## 2017-04-03 DIAGNOSIS — E7800 Pure hypercholesterolemia, unspecified: Secondary | ICD-10-CM | POA: Insufficient documentation

## 2017-04-03 DIAGNOSIS — C50411 Malignant neoplasm of upper-outer quadrant of right female breast: Secondary | ICD-10-CM | POA: Insufficient documentation

## 2017-04-03 DIAGNOSIS — R599 Enlarged lymph nodes, unspecified: Secondary | ICD-10-CM | POA: Diagnosis not present

## 2017-04-03 DIAGNOSIS — D649 Anemia, unspecified: Secondary | ICD-10-CM

## 2017-04-03 NOTE — Progress Notes (Signed)
Pt in today for results from R breast biopsy.  Pt very anxious regarding findings.  Rates distress as a 10.  MD and SW dept. notified.

## 2017-04-03 NOTE — Telephone Encounter (Signed)
FYI   STAT PET scheduled for 04/04/17 with arrival time of 9 a.m at the [facility].  NPO after midnight.   MD follow up 1 day after PET as requested for 04/05/14 at 9 a.m.   MUGA Scan schd for 04/09/17 with arrival time of 1:45 p.m. At the [facility].    Sherry/Doni, please advise patient as discussed earlier.   msg sent via staff msg to Dr Janese Banks, Cristi Loron.

## 2017-04-03 NOTE — Telephone Encounter (Signed)
PET Scan approved via peer to peer with [employer].   dhs

## 2017-04-03 NOTE — Telephone Encounter (Signed)
Spoke with patient via telephone. Gave her appointment and instructions for PET Scan tomorrow at 9:00 at [facility]. Gave her follow up appointment details for Thursday with Dr. Janese Banks at 9:00. Gave her appointment details for the MUGA Scan on 11/5. Patient verbalized understanding.   dhs

## 2017-04-04 ENCOUNTER — Ambulatory Visit
Admission: RE | Admit: 2017-04-04 | Discharge: 2017-04-04 | Disposition: A | Discharge status: Home / Self Care | Payer: 59 | Source: Ambulatory Visit | Attending: Oncology | Admitting: Oncology

## 2017-04-04 DIAGNOSIS — C50411 Malignant neoplasm of upper-outer quadrant of right female breast: Secondary | ICD-10-CM | POA: Insufficient documentation

## 2017-04-04 LAB — HER-2 / NEU, FISH
Avg Num CEP17 probes/nucleus:: 2.1
Avg Num Her-2 signals/nucleus:: 2.6
HER-2/CEP17 RATIO: 1.25
NUMBER OF OBSERVERS: 2
Number of Tumor Cells Counted:: 40

## 2017-04-04 LAB — ER/PR,IMMUNOHISTOCHEM,PARAFFIN
ESTROGEN RECEPTOR IHC: 0 %
Progesterone Recp IP: 5 %

## 2017-04-04 LAB — GLUCOSE, CAPILLARY: GLUCOSE-CAPILLARY: 96 mg/dL (ref 65–99)

## 2017-04-04 MED ORDER — FLUDEOXYGLUCOSE F - 18 (FDG) INJECTION
12.6900 | Freq: Once | INTRAVENOUS | Status: AC | PRN
  Administered 2017-04-04: 12.69 via INTRAVENOUS

## 2017-04-04 NOTE — H&P (View-Only) (Signed)
[facility] Consultation      Assessment and Plan:  Newly diagnosed breast cancer with mediastinal lymphadenopathy, positive PET scan with increased uptake in the right subcarinal, and right 11 R hilar node area.  This may represent metastatic malignancy, versus inflammation, versus primary malignancy.    -Risks and benefits were discussed with patient in depth. -We will plan for EBUS bronchoscopy. - Patient notes that she has a port placement planned on November 5th and would like to have the EBUS performed at that same time, we will see if we can coordinate the procedures if possible but explained that this will be at the discretion of anesthesia service.     Date: 04/05/2017  MRN# [id number] Ashley [place] 58-29-1962   Ashley [facility] is a 56 y.o. old female seen in consultation for chief complaint of:    Chief Complaint  Patient presents with  . Advice Only    ref by Janese Banks: no symptoms    HPI:   She was recently diagnosed with breast cancer, she had a staging PET scan which showed mediastinal lymphadenopathy.  She is here for a biopsy. She is a never smoker, parents smoked. 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 She feels that her breathing is "fine". She gets in about 8-10k steps per day. She does not exercise per se.  Husband is present and gives some of the history. She snores at night, uncertain of gasping.   Last surgery was several years ago >30 yrs ago.   Imaging personally reviewed, PET scan 04/04/17, there is mild right hilar and subcarinal lymphadenopathy which is positive on PET scan.   PMHX:   Past Medical History:  Diagnosis Date  . Anemia   . Breast cyst, right    aspirated by Dr. Bary Castilla  . Hyperlipidemia   . Pre-diabetes    Surgical Hx:  Past Surgical History:  Procedure Laterality Date  . BREAST CYST ASPIRATION Right 11/2009   Dr. Bary Castilla did FNA  . COLONOSCOPY  2011  . DILATION  AND CURETTAGE OF UTERUS     X3  . ENDOMETRIAL ABLATION    . ENDOMETRIAL BIOPSY  09/2009   Family Hx:  Family History  Problem Relation Age of Onset  . Melanoma Maternal Grandmother 55  . Brain cancer Maternal Grandfather 33  . Melanoma Other 32   Social Hx:   Social History  Substance Use Topics  . Smoking status: Never Smoker  . Smokeless tobacco: Never Used  . Alcohol use 1.2 oz/week    2 Glasses of wine per week   Medication:    Current Outpatient Prescriptions:  .  Biotin 2500 MCG CAPS, Take 5,000 mcg/day by mouth daily., Disp: , Rfl:  .  Cholecalciferol (VITAMIN D3) 10000 units TABS, Take by mouth 1 day or 1 dose., Disp: , Rfl:  .  Multiple Vitamins-Minerals (EMERGEN-C IMMUNE PLUS PO), Take 1 packet by mouth 1 day or 1 dose. With energy, Disp: , Rfl:  .  omega-3 acid ethyl esters (LOVAZA) 1 g capsule, Take by mouth 1 day or 1 dose., Disp: , Rfl:  .  vitamin E 400 UNIT capsule, Take 400 Units by mouth daily., Disp: , Rfl:    Allergies:  Penicillins  Review of Systems: Gen:  Denies  fever, sweats, chills HEENT: Denies blurred vision, double vision. bleeds, sore throat Cvc:  No dizziness, chest pain. Resp:   Denies cough or sputum production, shortness of breath Gi: Denies  swallowing difficulty, stomach pain. Gu:  Denies bladder incontinence, burning urine Ext:   No Joint pain, stiffness. Skin: No skin rash,  hives  Endoc:  No polyuria, polydipsia. Psych: No depression, insomnia. Other:  All other systems were reviewed with the patient and were negative other that what is mentioned in the HPI.   Physical Examination:   VS: BP 110/74 (BP Location: Left Arm, Cuff Size: Normal)   Pulse 66   Ht 5\' 3"  (1.6 m)   Wt 191 lb (86.6 kg)   LMP 04/04/2012 (Approximate)   SpO2 98%   BMI 33.83 kg/m   General Appearance: No distress  Neuro:without focal findings,  speech normal,  HEENT: PERRLA, EOM intact.   Pulmonary: normal breath sounds, No wheezing.    CardiovascularNormal S1,S2.  No m/r/g.   Abdomen: Benign, Soft, non-tender. Renal:  No costovertebral tenderness  GU:  No performed at this time. Endoc: No evident thyromegaly, no signs of acromegaly. Skin:   warm, no rashes, no ecchymosis  Extremities: normal, no cyanosis, clubbing.  Other findings:    LABORATORY PANEL:   CBC No results for input(s): WBC, HGB, HCT, PLT in the last 168 hours. ------------------------------------------------------------------------------------------------------------------  Chemistries  No results for input(s): NA, K, CL, CO2, GLUCOSE, BUN, CREATININE, CALCIUM, MG, AST, ALT, ALKPHOS, BILITOT in the last 168 hours.  Invalid input(s): GFRCGP ------------------------------------------------------------------------------------------------------------------  Cardiac Enzymes No results for input(s): TROPONINI in the last 168 hours. ------------------------------------------------------------  RADIOLOGY:  Nm Pet Image Initial (pi) Skull Base To Thigh  Result Date: 04/04/2017 CLINICAL DATA:  Initial treatment strategy for right-sided breast cancer. EXAM: NUCLEAR MEDICINE PET SKULL BASE TO THIGH TECHNIQUE: 12.7 mCi F-18 FDG was injected intravenously. Full-ring PET imaging was performed from the skull base to thigh after the radiotracer. CT data was obtained and used for attenuation correction and anatomic localization. FASTING BLOOD GLUCOSE:  Value: 96 mg/dl COMPARISON:  None. FINDINGS: NECK: No hypermetabolic lymph nodes in the neck. CHEST: Irregular right breast mass is hypermetabolic with SUV max = 7.2. Hypermetabolic nodal disease in the right axilla identified with SUV max = 5.3. Although not well demonstrated on noncontrast CT imaging for attenuation correction, there is hypermetabolic lymphadenopathy in both hilar regions and in the subcarinal station. Index hypermetabolism in the right hilum demonstrates SUV max = 6.9. Compressive atelectasis noted in  the lung bases on CT imaging. ABDOMEN/PELVIS: No abnormal hypermetabolic activity within the liver, pancreas, adrenal glands, or spleen. No hypermetabolic lymph nodes in the abdomen or pelvis. There is abdominal aortic atherosclerosis without aneurysm. Small umbilical and paraumbilical hernias contain only fat. SKELETON: No focal hypermetabolic activity to suggest skeletal metastasis. IMPRESSION: 1. Hypermetabolic right breast mass with hypermetabolic lymph nodes in the right axilla and hypermetabolic metastatic disease in both hilar regions and subcarinal mediastinum. 2. No evidence for hypermetabolic metastatic disease in the neck, abdomen or pelvis. Electronically Signed   By: Misty Stanley M.D.   On: 04/04/2017 11:22       Thank  you for the consultation and for allowing [facility] to assist in the care of your patient. Our recommendations are noted above.  Please contact us if we can be of further service.   Marda Stalker, MD.  Board Certified in Internal Medicine, Pulmonary Medicine, [place], and Sleep Medicine.  [facility] Office Number: [phone]  Patricia Pesa, M.D.  Merton Border, M.D  04/05/2017

## 2017-04-04 NOTE — Progress Notes (Signed)
Ashley [facility]      Assessment and Plan:  Newly diagnosed breast cancer with mediastinal lymphadenopathy, positive PET scan with increased uptake in the right subcarinal, and right 11 R hilar node area.  This may represent metastatic malignancy, versus inflammation, versus primary malignancy.    -Risks and benefits were discussed with patient in depth. -We will plan for EBUS bronchoscopy. - Patient notes that she has a port placement planned on November 5th and would like to have the EBUS performed at that same time, we will see if we can coordinate the procedures if possible but explained that this will be at the discretion of anesthesia service.     Date: 04/05/2017  MRN# [id number] Ashley [place]ina Feb 24, 1961   Ashley Molina is a 56 y.o. old female seen in [place] for chief complaint of:    Chief Complaint  Patient presents with  . Advice Only    ref by Janese Banks: no symptoms    HPI:   She was recently diagnosed with breast cancer, she had a staging PET scan which showed mediastinal lymphadenopathy.  She is here for a biopsy. She is a never smoker, parents smoked. 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 She feels that her breathing is "fine". She gets in about 8-10k steps per day. She does not exercise per se.  Husband is present and gives some of the history. She snores at night, uncertain of gasping.   Last surgery was several years ago >30 yrs ago.   Imaging personally reviewed, PET scan 04/04/17, there is mild right hilar and subcarinal lymphadenopathy which is positive on PET scan.   PMHX:   Past Medical History:  Diagnosis Date  . Anemia   . Breast cyst, right    aspirated by Dr. Bary Castilla  . Hyperlipidemia   . Pre-diabetes    Surgical Hx:  Past Surgical History:  Procedure Laterality Date  . BREAST CYST ASPIRATION Right 11/2009   Dr. Bary Castilla did FNA  . COLONOSCOPY  2011  . DILATION  AND CURETTAGE OF UTERUS     X3  . ENDOMETRIAL ABLATION    . ENDOMETRIAL BIOPSY  09/2009   Family Hx:  Family History  Problem Relation Age of Onset  . Melanoma Maternal Grandmother 63  . Brain cancer Maternal Grandfather 39  . Melanoma Other 6   Social Hx:   Social History  Substance Use Topics  . Smoking status: Never Smoker  . Smokeless tobacco: Never Used  . Alcohol use 1.2 oz/week    2 Glasses of wine per week   Medication:    Current Outpatient Prescriptions:  .  Biotin 2500 MCG CAPS, Take 5,000 mcg/day by mouth daily., Disp: , Rfl:  .  Cholecalciferol (VITAMIN D3) 10000 units TABS, Take by mouth 1 day or 1 dose., Disp: , Rfl:  .  Multiple Vitamins-Minerals (EMERGEN-C IMMUNE PLUS PO), Take 1 packet by mouth 1 day or 1 dose. With energy, Disp: , Rfl:  .  omega-3 acid ethyl esters (LOVAZA) 1 g capsule, Take by mouth 1 day or 1 dose., Disp: , Rfl:  .  vitamin E 400 UNIT capsule, Take 400 Units by mouth daily., Disp: , Rfl:    Allergies:  Penicillins  Review of Systems: Gen:  Denies  fever, sweats, chills HEENT: Denies blurred vision, double vision. bleeds, sore throat Cvc:  No dizziness, chest pain. Resp:   Denies cough or sputum production, shortness of breath Gi: Denies  swallowing difficulty, stomach pain. Gu:  Denies bladder incontinence, burning urine Ext:   No Joint pain, stiffness. Skin: No skin rash,  hives  Endoc:  No polyuria, polydipsia. Psych: No depression, insomnia. Other:  All other systems were reviewed with the patient and were negative other that what is mentioned in the HPI.   Physical Examination:   VS: BP 110/74 (BP Location: Left Arm, Cuff Size: Normal)   Pulse 66   Ht 5\' 3"  (1.6 m)   Wt 191 lb (86.6 kg)   LMP 04/04/2012 (Approximate)   SpO2 98%   BMI 33.83 kg/m   General Appearance: No distress  Neuro:without focal findings,  speech normal,  HEENT: PERRLA, EOM intact.   Pulmonary: normal breath sounds, No wheezing.    CardiovascularNormal S1,S2.  No m/r/g.   Abdomen: Benign, Soft, non-tender. Renal:  No costovertebral tenderness  GU:  No performed at this time. Endoc: No evident thyromegaly, no signs of acromegaly. Skin:   warm, no rashes, no ecchymosis  Extremities: normal, no cyanosis, clubbing.  Other findings:    LABORATORY PANEL:   CBC No results for input(s): WBC, HGB, HCT, PLT in the last 168 hours. ------------------------------------------------------------------------------------------------------------------  Chemistries  No results for input(s): NA, K, CL, CO2, GLUCOSE, BUN, CREATININE, CALCIUM, MG, AST, ALT, ALKPHOS, BILITOT in the last 168 hours.  Invalid input(s): GFRCGP ------------------------------------------------------------------------------------------------------------------  Cardiac Enzymes No results for input(s): TROPONINI in the last 168 hours. ------------------------------------------------------------  RADIOLOGY:  Nm Pet Image Initial (pi) Skull Base To Thigh  Result Date: 04/04/2017 CLINICAL DATA:  Initial treatment strategy for right-sided breast cancer. EXAM: NUCLEAR MEDICINE PET SKULL BASE TO THIGH TECHNIQUE: 12.7 mCi F-18 FDG was injected intravenously. Full-ring PET imaging was performed from the skull base to thigh after the radiotracer. CT data was obtained and used for attenuation correction and anatomic localization. FASTING BLOOD GLUCOSE:  Value: 96 mg/dl COMPARISON:  None. FINDINGS: NECK: No hypermetabolic lymph nodes in the neck. CHEST: Irregular right breast mass is hypermetabolic with SUV max = 7.2. Hypermetabolic nodal disease in the right axilla identified with SUV max = 5.3. Although not well demonstrated on noncontrast CT imaging for attenuation correction, there is hypermetabolic lymphadenopathy in both hilar regions and in the subcarinal station. Index hypermetabolism in the right hilum demonstrates SUV max = 6.9. Compressive atelectasis noted in  the lung bases on CT imaging. ABDOMEN/PELVIS: No abnormal hypermetabolic activity within the liver, pancreas, adrenal glands, or spleen. No hypermetabolic lymph nodes in the abdomen or pelvis. There is abdominal aortic atherosclerosis without aneurysm. Small umbilical and paraumbilical hernias contain only fat. SKELETON: No focal hypermetabolic activity to suggest skeletal metastasis. IMPRESSION: 1. Hypermetabolic right breast mass with hypermetabolic lymph nodes in the right axilla and hypermetabolic metastatic disease in both hilar regions and subcarinal mediastinum. 2. No evidence for hypermetabolic metastatic disease in the neck, abdomen or pelvis. Electronically Signed   By: Misty Stanley M.D.   On: 04/04/2017 11:22       Thank  you for the [place] and for allowing [facility] to assist in the care of your patient. Our recommendations are noted above.  Please contact us if we can be of further service.   Marda Stalker, MD.  Board Certified in Internal Medicine, Pulmonary Medicine, [place], and Sleep Medicine.  [facility] Office Number: [phone]  Patricia Pesa, M.D.  Merton Border, M.D  04/05/2017

## 2017-04-05 ENCOUNTER — Encounter: Payer: Self-pay | Admitting: *Deleted

## 2017-04-05 ENCOUNTER — Ambulatory Visit (INDEPENDENT_AMBULATORY_CARE_PROVIDER_SITE_OTHER): Payer: 59 | Admitting: Internal Medicine

## 2017-04-05 ENCOUNTER — Encounter: Payer: Self-pay | Admitting: Internal Medicine

## 2017-04-05 ENCOUNTER — Telehealth: Payer: Self-pay | Admitting: *Deleted

## 2017-04-05 ENCOUNTER — Inpatient Hospital Stay: Payer: 59 | Attending: Oncology | Admitting: Oncology

## 2017-04-05 ENCOUNTER — Encounter
Admission: RE | Admit: 2017-04-05 | Discharge: 2017-04-05 | Disposition: A | Discharge status: Home / Self Care | Payer: 59 | Source: Ambulatory Visit | Attending: General Surgery | Admitting: General Surgery

## 2017-04-05 ENCOUNTER — Encounter: Payer: Self-pay | Admitting: Oncology

## 2017-04-05 ENCOUNTER — Other Ambulatory Visit: Payer: Self-pay | Admitting: General Surgery

## 2017-04-05 ENCOUNTER — Other Ambulatory Visit: Payer: Self-pay | Admitting: *Deleted

## 2017-04-05 ENCOUNTER — Telehealth: Payer: Self-pay | Admitting: Oncology

## 2017-04-05 ENCOUNTER — Telehealth: Payer: Self-pay | Admitting: Internal Medicine

## 2017-04-05 VITALS — BP 110/74 | HR 66 | Ht 63.0 in | Wt 191.0 lb

## 2017-04-05 VITALS — BP 108/73 | HR 79 | Resp 14 | Wt 194.0 lb

## 2017-04-05 DIAGNOSIS — C50411 Malignant neoplasm of upper-outer quadrant of right female breast: Secondary | ICD-10-CM | POA: Diagnosis not present

## 2017-04-05 DIAGNOSIS — Z5111 Encounter for antineoplastic chemotherapy: Secondary | ICD-10-CM | POA: Diagnosis not present

## 2017-04-05 DIAGNOSIS — Z7689 Persons encountering health services in other specified circumstances: Secondary | ICD-10-CM | POA: Diagnosis not present

## 2017-04-05 DIAGNOSIS — R590 Localized enlarged lymph nodes: Secondary | ICD-10-CM | POA: Diagnosis not present

## 2017-04-05 DIAGNOSIS — Z79899 Other long term (current) drug therapy: Secondary | ICD-10-CM | POA: Insufficient documentation

## 2017-04-05 DIAGNOSIS — E785 Hyperlipidemia, unspecified: Secondary | ICD-10-CM | POA: Diagnosis not present

## 2017-04-05 DIAGNOSIS — Z7189 Other specified counseling: Secondary | ICD-10-CM

## 2017-04-05 DIAGNOSIS — D649 Anemia, unspecified: Secondary | ICD-10-CM | POA: Diagnosis not present

## 2017-04-05 DIAGNOSIS — Z171 Estrogen receptor negative status [ER-]: Secondary | ICD-10-CM | POA: Diagnosis not present

## 2017-04-05 DIAGNOSIS — C50011 Malignant neoplasm of nipple and areola, right female breast: Secondary | ICD-10-CM

## 2017-04-05 DIAGNOSIS — C771 Secondary and unspecified malignant neoplasm of intrathoracic lymph nodes: Secondary | ICD-10-CM | POA: Diagnosis not present

## 2017-04-05 DIAGNOSIS — C50911 Malignant neoplasm of unspecified site of right female breast: Secondary | ICD-10-CM

## 2017-04-05 HISTORY — DX: Malignant neoplasm of unspecified site of right female breast: C50.911

## 2017-04-05 MED ORDER — LIDOCAINE-PRILOCAINE 2.5-2.5 % EX CREA: TOPICAL_CREAM | CUTANEOUS | 3 refills | Status: DC

## 2017-04-05 MED ORDER — PROCHLORPERAZINE MALEATE 10 MG PO TABS: 10.0000 mg | ORAL_TABLET | Freq: Four times a day (QID) | ORAL | 1 refills | Status: DC | PRN

## 2017-04-05 MED ORDER — LORAZEPAM 0.5 MG PO TABS: 0.5000 mg | ORAL_TABLET | Freq: Four times a day (QID) | ORAL | 0 refills | Status: DC | PRN

## 2017-04-05 MED ORDER — ONDANSETRON HCL 8 MG PO TABS: 8.0000 mg | ORAL_TABLET | Freq: Two times a day (BID) | ORAL | 1 refills | Status: DC | PRN

## 2017-04-05 MED ORDER — DEXAMETHASONE 4 MG PO TABS: 8.0000 mg | ORAL_TABLET | Freq: Every day | ORAL | 1 refills | Status: DC

## 2017-04-05 NOTE — Patient Instructions (Signed)

## 2017-04-05 NOTE — Telephone Encounter (Signed)
Returned call to [place] but she was on the phone with another patient. Orders for patient have already been entered. Pre-admit notified. Nothing further needed.

## 2017-04-05 NOTE — Patient Instructions (Signed)
Your procedure is scheduled on: 04/09/17 Report to Day Surgery. [facility] SECOND FLOOR To find out your arrival time please call [phone] between 1PM - 3PM on   04/06/17.  Remember: Instructions that are not followed completely may result in serious medical risk, up to and including death, or upon the discretion of your surgeon and anesthesiologist your surgery may need to be rescheduled.     _X__ 1. Do not eat food after midnight the night before your procedure.                 No gum chewing or hard candies. You may drink clear liquids up to 2 hours                 before you are scheduled to arrive for your surgery- DO not drink clear                 liquids within 2 hours of the start of your surgery.                 Clear Liquids include:  water, apple juice without pulp, clear carbohydrate                 drink such as Clearfast of Gartorade, Black Coffee or Tea (Do not add                 anything to coffee or tea).     _X__ 2.  No Alcohol for 24 hours before or after surgery.   _X__ 3.  Do Not Smoke or use e-cigarettes For 24 Hours Prior to Your Surgery.                 Do not use any chewable tobacco products for at least 6 hours prior to                 surgery.  ____  4.  Bring all medications with you on the day of surgery if instructed.   __X__  5.  Notify your doctor if there is any change in your medical condition      (cold, fever, infections).     Do not wear jewelry, make-up, hairpins, clips or nail polish. Do not wear lotions, powders, or perfumes. You may wear deodorant. Do not shave 48 hours prior to surgery. Men may shave face and neck. Do not bring valuables to the hospital.    [facility] is not responsible for any belongings or valuables.  Contacts, dentures or bridgework may not be worn into surgery. Leave your suitcase in the car. After surgery it may be brought to your room. For patients admitted to the hospital, discharge  time is determined by your treatment team.   Patients discharged the day of surgery will not be allowed to drive home.   ____ Take these medicines the morning of surgery with A SIP OF WATER:    1. NONE  2.   3.   4.  5.  6.  ____ Fleet Enema (as directed)   ____ Use CHG Soap as directed  ____ Use inhalers on the day of surgery  ____ Stop metformin 2 days prior to surgery    ____ Take 1/2 of usual insulin dose the night before surgery. No insulin the morning          of surgery.   ____ Stop Coumadin/Plavix/aspirin on   ____ Stop Anti-inflammatories on   __X__ Stop supplements until after surgery.  ____ Bring C-Pap to the hospital.

## 2017-04-05 NOTE — Progress Notes (Signed)
  Oncology Nurse Navigator Documentation  Navigator Location: [facility] (04/05/17 1200)   )Navigator Encounter Type: Introductory phone call (04/05/17 1200)   Abnormal Finding Date: 03/27/17 (04/05/17 1200) Confirmed Diagnosis Date: 03/27/17 (04/05/17 1200)             Treatment Initiated Date: 04/12/17 (04/05/17 1200) Patient Visit Type: Initial (04/05/17 1200) Treatment Phase: Pre-Tx/Tx Discussion (04/05/17 1200) Barriers/Navigation Needs: Education (Counseling Service) (04/05/17 1200)   Interventions: Psycho-social support (Counselor) (04/05/17 1200)        Support Groups/Services: Other (Counselor) (04/05/17 1200)             Time Spent with Patient: 30 (04/05/17 1200)   Phoned patient to introduce Navigation.  States she is for port placement, and possible biopsy on Monday 04/09/17.  Plant to meet on 04/10/17 at chemo class to take Breast Cancer Treatment Handbook/folder with hospital services.  Patient asks about counseling, and Support Group.  Given number for [employer], and inform ation about Breast Cancer Suppor Group meetings.  She is to begin chemotherapy on 04/12/17.

## 2017-04-05 NOTE — Patient Instructions (Signed)
--  Will schedule bronchoscopic biopsy of lymph nodes.

## 2017-04-05 NOTE — Telephone Encounter (Signed)
MD/ NEW** Carbo/Taxol, per 04/05/17 los. Per patient/Sherry, Labs will be drawn at [facility] 1 day prior.  Judeen Hammans will update LOS.

## 2017-04-05 NOTE — Progress Notes (Signed)
Patient here for follow up with PET Scan results today. She is feeling very anxious and nervous about everything, and she reports having a burning sensation in her right breast this morning, which is still there now.

## 2017-04-05 NOTE — Telephone Encounter (Signed)
Spoke with Otila Kluver at [facility]; arranged appointment for patient to be seen on November 14th @1 :00 by Dr. Alinda Money (OK'd by Dr. Janese Banks).   dhs

## 2017-04-05 NOTE — Progress Notes (Signed)
Hematology/Oncology Consult note [facility]  Telephone:([phone] [phone]  Patient Care Team: Patient, No Pcp Per as PCP - General (General Practice) Byrnett, Forest Gleason, MD (General Surgery) Gae Dry, MD as Referring Physician (Obstetrics and Gynecology)   Name of the patient: Ashley Molina  [id number]  31-Mar-1961   Date of visit: 04/05/17   Diagnosis-at least stage III invasive mammary carcinoma of the right breast [id number] ER negative, PR 5% positive and HER-2/neu negative  Chief complaint/ Reason for visit- discuss PET/CT results and further management  Heme/Onc history: 1.  Patient is a 56 year old postmenopausal female who had a routine screening mammogram on 03/23/2017 which showed a highly suspicious palpable solid mass in the 10 o'clock position.  Mass measures at least 5 cm in maximum diameter for right axillary lymph nodes have sonographic features suspicious for metastatic disease.  No evidence of malignancy was noted in the left breast.  This was followed by an ultrasound of the right breast including the right axilla which confirmed the same findings.Targeted ultrasound is performed, showing a markedly irregular and hypoechoic mass centered at 10 o'clock position approximately 7 cm from the nipple estimated to be at least 5.2 x 4.4 x 3.4 cm. Small areas of vascular flow are detected within the mass. At 10 o'clock position 9 cm from the nipple again noted is a simple cyst measuring approximately 2.4 cm greatest diameter. Skin thickness of the medial and lateral of right breast measures approximately 4 mm.  Ultrasound of the right axilla demonstrates 4 suspicious lymph nodes with hypoechoic thickened cortices. The largest of these lymph nodes measures 2.2 x 1.2 x 1.0 cm and has a cortical thickness of 4-5 mm.  Patient underwent core biopsy of the right breast mass.  2.  Biopsy showed 15 mm invasive carcinoma, grade  3.  Right axillary lymph node biopsy was also consistent with metastatic carcinoma.  ER negative PR 5% positive,and HER-2 negative by FISH  3. Patient is G3P3L3. Menarche at the age of 17. Menopause at 51. No priro h/o breast biopsies. No family h/o breast cancer. She was known to have right breast cyst being followed by Dr. Bary Castilla. She noticed this amss sometime over the summer and feels this has grown since then. She is healthy and has no significant co-morbidities  4. PET/CT scan on 04/04/17 showed: IMPRESSION: 1. Hypermetabolic right breast mass with hypermetabolic lymph nodes in the right axilla and hypermetabolic metastatic disease in both hilar regions and subcarinal mediastinum. 2. No evidence for hypermetabolic metastatic disease in the neck, abdomen or pelvis  Interval history- no changes since last visit 2 days ago  ECOG PS- 0 Pain scale- 0   Review of systems- Review of Systems  Constitutional: Negative for chills, fever, malaise/fatigue and weight loss.  HENT: Negative for congestion, ear discharge and nosebleeds.   Eyes: Negative for blurred vision.  Respiratory: Negative for cough, hemoptysis, sputum production, shortness of breath and wheezing.   Cardiovascular: Negative for chest pain, palpitations, orthopnea and claudication.  Gastrointestinal: Negative for abdominal pain, blood in stool, constipation, diarrhea, heartburn, melena, nausea and vomiting.  Genitourinary: Negative for dysuria, flank pain, frequency, hematuria and urgency.  Musculoskeletal: Negative for back pain, joint pain and myalgias.  Skin: Negative for rash.  Neurological: Negative for dizziness, tingling, focal weakness, seizures, weakness and headaches.  Endo/Heme/Allergies: Does not bruise/bleed easily.  Psychiatric/Behavioral: Negative for depression and suicidal ideas. The patient does not have insomnia.  Allergies  Allergen Reactions  . Penicillins Rash     Past Medical History:    Diagnosis Date  . Anemia   . Breast cyst, right    aspirated by Dr. Bary Castilla  . Hyperlipidemia   . Pre-diabetes      Past Surgical History:  Procedure Laterality Date  . BREAST CYST ASPIRATION Right 11/2009   Dr. Bary Castilla did FNA  . COLONOSCOPY  2011  . DILATION AND CURETTAGE OF UTERUS     X3  . ENDOMETRIAL ABLATION    . ENDOMETRIAL BIOPSY  09/2009    Social History   Social History  . Marital status: Married    Spouse name: N/A  . Number of children: N/A  . Years of education: N/A   Occupational History  . Not on file.   Social History Main Topics  . Smoking status: Never Smoker  . Smokeless tobacco: Never Used  . Alcohol use 1.2 oz/week    2 Glasses of wine per week  . Drug use: No  . Sexual activity: Not Currently   Other Topics Concern  . Not on file   Social History Narrative  . No narrative on file    Family History  Problem Relation Age of Onset  . Melanoma Maternal Grandmother 34  . Brain cancer Maternal Grandfather 71  . Melanoma Other 90     Current Outpatient Prescriptions:  .  Biotin 2500 MCG CAPS, Take 5,000 mcg/day by mouth daily., Disp: , Rfl:  .  Cholecalciferol (VITAMIN D3) 10000 units TABS, Take by mouth 1 day or 1 dose., Disp: , Rfl:  .  Multiple Vitamins-Minerals (EMERGEN-C IMMUNE PLUS PO), Take 1 packet by mouth 1 day or 1 dose. With energy, Disp: , Rfl:  .  omega-3 acid ethyl esters (LOVAZA) 1 g capsule, Take by mouth 1 day or 1 dose., Disp: , Rfl:  .  vitamin E 400 UNIT capsule, Take 400 Units by mouth daily., Disp: , Rfl:   Physical exam:  Vitals:   04/05/17 0907  BP: 108/73  Pulse: 79  Resp: 14  Weight: 194 lb (88 kg)   Physical Exam  Constitutional: She is oriented to person, place, and time and well-developed, well-nourished, and in no distress.  HENT:  Head: Normocephalic and atraumatic.  Eyes: Pupils are equal, round, and reactive to light. EOM are normal.  Neck: Normal range of motion.  Cardiovascular: Normal  rate, regular rhythm and normal heart sounds.   Pulmonary/Chest: Effort normal and breath sounds normal.  Abdominal: Soft. Bowel sounds are normal.  Neurological: She is alert and oriented to person, place, and time.  Skin: Skin is warm and dry.     CMP Latest Ref Rng & Units 03/28/2017  Glucose 65 - 99 mg/dL 102(H)  BUN 6 - 24 mg/dL 15  Creatinine 0.57 - 1.00 mg/dL 0.82  Sodium 134 - 144 mmol/L 140  Potassium 3.5 - 5.2 mmol/L 4.1  Chloride 96 - 106 mmol/L 106  CO2 20 - 29 mmol/L 22  Calcium 8.7 - 10.2 mg/dL 9.3  Total Protein 6.0 - 8.5 g/dL 6.9  Total Bilirubin 0.0 - 1.2 mg/dL 0.4  Alkaline Phos 39 - 117 IU/L 89  AST 0 - 40 IU/L 19  ALT 0 - 32 IU/L 22   CBC Latest Ref Rng & Units 03/28/2017  WBC 3.4 - 10.8 x10E3/uL 4.3  Hemoglobin 11.1 - 15.9 g/dL 12.9  Hematocrit 34.0 - 46.6 % 38.2  Platelets 150 - 379 x10E3/uL 245  No images are attached to the encounter.  Nm Pet Image Initial (pi) Skull Base To Thigh  Result Date: 04/04/2017 CLINICAL DATA:  Initial treatment strategy for right-sided breast cancer. EXAM: NUCLEAR MEDICINE PET SKULL BASE TO THIGH TECHNIQUE: 12.7 mCi F-18 FDG was injected intravenously. Full-ring PET imaging was performed from the skull base to thigh after the radiotracer. CT data was obtained and used for attenuation correction and anatomic localization. FASTING BLOOD GLUCOSE:  Value: 96 mg/dl COMPARISON:  None. FINDINGS: NECK: No hypermetabolic lymph nodes in the neck. CHEST: Irregular right breast mass is hypermetabolic with SUV max = 7.2. Hypermetabolic nodal disease in the right axilla identified with SUV max = 5.3. Although not well demonstrated on noncontrast CT imaging for attenuation correction, there is hypermetabolic lymphadenopathy in both hilar regions and in the subcarinal station. Index hypermetabolism in the right hilum demonstrates SUV max = 6.9. Compressive atelectasis noted in the lung bases on CT imaging. ABDOMEN/PELVIS: No abnormal  hypermetabolic activity within the liver, pancreas, adrenal glands, or spleen. No hypermetabolic lymph nodes in the abdomen or pelvis. There is abdominal aortic atherosclerosis without aneurysm. Small umbilical and paraumbilical hernias contain only fat. SKELETON: No focal hypermetabolic activity to suggest skeletal metastasis. IMPRESSION: 1. Hypermetabolic right breast mass with hypermetabolic lymph nodes in the right axilla and hypermetabolic metastatic disease in both hilar regions and subcarinal mediastinum. 2. No evidence for hypermetabolic metastatic disease in the neck, abdomen or pelvis. Electronically Signed   By: Misty Stanley M.D.   On: 04/04/2017 11:22   US Breast Complete Uni Right Inc Axilla  Result Date: 03/27/2017 Indications for biopsy of both the dominant mass in the right breast as well as the enlarged axillary lymph node were reviewed with the patient and she was amenable to proceed. Examination of the axilla showed at least 1 dominant lymph node measuring up to 2.16 cm in diameter. The area was infiltrated with 10 mL of 0.5% Xylocaine with 0.25% Marcaine with 1 200,000 of epinephrine. A 14-gauge spring-loaded core biopsy device was used and 3 core samples were obtained. A postbiopsy clip was placed. This was completed in a transverse orientation from posterior to anterior. No bleeding noted. The right breast mass is a spiculated hypoechoic mass measuring at least 3.69 x 3.74 x 7.2 cm. The patient received an additional 10 mL of 0.5% Xylocaine with 0.25% Marcaine with 1-200,000 epinephrine. Again using a 14-gauge core biopsy device, and using a lateral to medial approach 4 core samples were obtained after skin incision with an 11-gauge blade through various locations in the lesion. A postbiopsy clip was placed. There was about 10 mL of bleeding that was controlled with direct pressure. Both skin defects were closed with benzoin and Steri-Strips. Telfa and Tegaderm dressings applied.   US  Breast Ltd Uni Left Inc Axilla  Result Date: 03/23/2017 CLINICAL DATA:  56 year old patient presents for evaluation of an enlarging mass in the upper-outer quadrant of the right breast that she noticed began increasing in size this summer. She has a known palpable simple benign cyst in the upper-outer quadrant of the right breast previously evaluated by mammogram and ultrasound. This cyst was last evaluated in August of 2016. EXAM: 2D DIGITAL DIAGNOSTIC BILATERAL MAMMOGRAM WITH CAD AND ADJUNCT TOMO ULTRASOUND BILATERAL BREAST COMPARISON:  Previous exam(s). ACR Breast Density Category b: There are scattered areas of fibroglandular density. FINDINGS: Metallic skin marker was placed over the palpable area of concern in the right breast. There is a dominant spiculated mass in the  upper-outer quadrant of the right breast that is adjacent to but separate from the stable circumscribed previously documented cyst in this region of the right breast. The spiculated mass measures at least 5 cm maximum transverse diameter and craniocaudal span. Anterior to posterior, the mass measures approximately 4.5 cm. There is new mild skin thickening of the right breast. No suspicious microcalcifications on the right. Question cortical thickening of a right axillary lymph node. A small asymmetry in the upper-outer quadrant of the left breast was evaluated with additional spot compression and 90 degree lateral view and has appearances consistent with normal fibroglandular tissue. No mass, distortion, or suspicious microcalcification is identified in the left breast. Mammographic images were processed with CAD. On physical exam, there is a mildly protuberant and very firm palpable mass in the upper-outer quadrant of the right breast centered at 10 o'clock position approximately 7 cm from the nipple. On physical exam the mass measures approximately 6-7 cm. There is evidence of slight skin thickening in the medial and lateral aspects of the  right breast. The nipple appears normal. I do not palpate any discrete lymphadenopathy in the right axilla. Targeted ultrasound is performed, showing a markedly irregular and hypoechoic mass centered at 10 o'clock position approximately 7 cm from the nipple estimated to be at least 5.2 x 4.4 x 3.4 cm. Small areas of vascular flow are detected within the mass. At 10 o'clock position 9 cm from the nipple again noted is a simple cyst measuring approximately 2.4 cm greatest diameter. Skin thickness of the medial and lateral of right breast measures approximately 4 mm. Ultrasound of the right axilla demonstrates 4 suspicious lymph nodes with hypoechoic thickened cortices. The largest of these lymph nodes measures 2.2 x 1.2 x 1.0 cm and has a cortical thickness of 4-5 mm. IMPRESSION: 1. Highly suspicious palpable solid mass centered in the 10 o'clock position of the right breast 7 cm from the nipple. The size of the mass is likely best estimated by the mammogram, and measures at least 5 cm maximum diameter. There is diffuse mild skin thickening of the right breast. 2. Four right axillary lymph nodes have sonographic features suspicious for metastatic disease. 3. Chronic benign simple cyst in the upper-outer quadrant of the right breast. 4. No evidence of malignancy in the left breast. RECOMMENDATION: Ultrasound-guided biopsy is recommended of the suspicious palpable mass in the 10 o'clock position the right breast. Ultrasound-guided biopsy of 1 of the suspicious right axillary lymph nodes is recommended. I have discussed the findings and recommendations with the patient. Results were also provided in writing at the conclusion of the visit. If applicable, a reminder letter will be sent to the patient regarding the next appointment. BI-RADS CATEGORY  5: Highly suggestive of malignancy. Electronically Signed   By: Curlene Dolphin M.D.   On: 03/23/2017 15:57   US Breast Ltd Uni Right Inc Axilla  Result Date:  03/23/2017 CLINICAL DATA:  55 year old patient presents for evaluation of an enlarging mass in the upper-outer quadrant of the right breast that she noticed began increasing in size this summer. She has a known palpable simple benign cyst in the upper-outer quadrant of the right breast previously evaluated by mammogram and ultrasound. This cyst was last evaluated in August of 2016. EXAM: 2D DIGITAL DIAGNOSTIC BILATERAL MAMMOGRAM WITH CAD AND ADJUNCT TOMO ULTRASOUND BILATERAL BREAST COMPARISON:  Previous exam(s). ACR Breast Density Category b: There are scattered areas of fibroglandular density. FINDINGS: Metallic skin marker was placed over the palpable area  of concern in the right breast. There is a dominant spiculated mass in the upper-outer quadrant of the right breast that is adjacent to but separate from the stable circumscribed previously documented cyst in this region of the right breast. The spiculated mass measures at least 5 cm maximum transverse diameter and craniocaudal span. Anterior to posterior, the mass measures approximately 4.5 cm. There is new mild skin thickening of the right breast. No suspicious microcalcifications on the right. Question cortical thickening of a right axillary lymph node. A small asymmetry in the upper-outer quadrant of the left breast was evaluated with additional spot compression and 90 degree lateral view and has appearances consistent with normal fibroglandular tissue. No mass, distortion, or suspicious microcalcification is identified in the left breast. Mammographic images were processed with CAD. On physical exam, there is a mildly protuberant and very firm palpable mass in the upper-outer quadrant of the right breast centered at 10 o'clock position approximately 7 cm from the nipple. On physical exam the mass measures approximately 6-7 cm. There is evidence of slight skin thickening in the medial and lateral aspects of the right breast. The nipple appears normal. I do  not palpate any discrete lymphadenopathy in the right axilla. Targeted ultrasound is performed, showing a markedly irregular and hypoechoic mass centered at 10 o'clock position approximately 7 cm from the nipple estimated to be at least 5.2 x 4.4 x 3.4 cm. Small areas of vascular flow are detected within the mass. At 10 o'clock position 9 cm from the nipple again noted is a simple cyst measuring approximately 2.4 cm greatest diameter. Skin thickness of the medial and lateral of right breast measures approximately 4 mm. Ultrasound of the right axilla demonstrates 4 suspicious lymph nodes with hypoechoic thickened cortices. The largest of these lymph nodes measures 2.2 x 1.2 x 1.0 cm and has a cortical thickness of 4-5 mm. IMPRESSION: 1. Highly suspicious palpable solid mass centered in the 10 o'clock position of the right breast 7 cm from the nipple. The size of the mass is likely best estimated by the mammogram, and measures at least 5 cm maximum diameter. There is diffuse mild skin thickening of the right breast. 2. Four right axillary lymph nodes have sonographic features suspicious for metastatic disease. 3. Chronic benign simple cyst in the upper-outer quadrant of the right breast. 4. No evidence of malignancy in the left breast. RECOMMENDATION: Ultrasound-guided biopsy is recommended of the suspicious palpable mass in the 10 o'clock position the right breast. Ultrasound-guided biopsy of 1 of the suspicious right axillary lymph nodes is recommended. I have discussed the findings and recommendations with the patient. Results were also provided in writing at the conclusion of the visit. If applicable, a reminder letter will be sent to the patient regarding the next appointment. BI-RADS CATEGORY  5: Highly suggestive of malignancy. Electronically Signed   By: Curlene Dolphin M.D.   On: 03/23/2017 15:57   Mm Diag Breast Tomo Bilateral  Result Date: 03/23/2017 CLINICAL DATA:  56 year old patient presents for  evaluation of an enlarging mass in the upper-outer quadrant of the right breast that she noticed began increasing in size this summer. She has a known palpable simple benign cyst in the upper-outer quadrant of the right breast previously evaluated by mammogram and ultrasound. This cyst was last evaluated in August of 2016. EXAM: 2D DIGITAL DIAGNOSTIC BILATERAL MAMMOGRAM WITH CAD AND ADJUNCT TOMO ULTRASOUND BILATERAL BREAST COMPARISON:  Previous exam(s). ACR Breast Density Category b: There are scattered areas of  fibroglandular density. FINDINGS: Metallic skin marker was placed over the palpable area of concern in the right breast. There is a dominant spiculated mass in the upper-outer quadrant of the right breast that is adjacent to but separate from the stable circumscribed previously documented cyst in this region of the right breast. The spiculated mass measures at least 5 cm maximum transverse diameter and craniocaudal span. Anterior to posterior, the mass measures approximately 4.5 cm. There is new mild skin thickening of the right breast. No suspicious microcalcifications on the right. Question cortical thickening of a right axillary lymph node. A small asymmetry in the upper-outer quadrant of the left breast was evaluated with additional spot compression and 90 degree lateral view and has appearances consistent with normal fibroglandular tissue. No mass, distortion, or suspicious microcalcification is identified in the left breast. Mammographic images were processed with CAD. On physical exam, there is a mildly protuberant and very firm palpable mass in the upper-outer quadrant of the right breast centered at 10 o'clock position approximately 7 cm from the nipple. On physical exam the mass measures approximately 6-7 cm. There is evidence of slight skin thickening in the medial and lateral aspects of the right breast. The nipple appears normal. I do not palpate any discrete lymphadenopathy in the right axilla.  Targeted ultrasound is performed, showing a markedly irregular and hypoechoic mass centered at 10 o'clock position approximately 7 cm from the nipple estimated to be at least 5.2 x 4.4 x 3.4 cm. Small areas of vascular flow are detected within the mass. At 10 o'clock position 9 cm from the nipple again noted is a simple cyst measuring approximately 2.4 cm greatest diameter. Skin thickness of the medial and lateral of right breast measures approximately 4 mm. Ultrasound of the right axilla demonstrates 4 suspicious lymph nodes with hypoechoic thickened cortices. The largest of these lymph nodes measures 2.2 x 1.2 x 1.0 cm and has a cortical thickness of 4-5 mm. IMPRESSION: 1. Highly suspicious palpable solid mass centered in the 10 o'clock position of the right breast 7 cm from the nipple. The size of the mass is likely best estimated by the mammogram, and measures at least 5 cm maximum diameter. There is diffuse mild skin thickening of the right breast. 2. Four right axillary lymph nodes have sonographic features suspicious for metastatic disease. 3. Chronic benign simple cyst in the upper-outer quadrant of the right breast. 4. No evidence of malignancy in the left breast. RECOMMENDATION: Ultrasound-guided biopsy is recommended of the suspicious palpable mass in the 10 o'clock position the right breast. Ultrasound-guided biopsy of 1 of the suspicious right axillary lymph nodes is recommended. I have discussed the findings and recommendations with the patient. Results were also provided in writing at the conclusion of the visit. If applicable, a reminder letter will be sent to the patient regarding the next appointment. BI-RADS CATEGORY  5: Highly suggestive of malignancy. Electronically Signed   By: Curlene Dolphin M.D.   On: 03/23/2017 15:57     Assessment and plan- Patient is a 56 y.o. female with atleast Stage IIIA cT3N1Mx invasive mammary carcinoma Er negative PR 5% positive and her 2 negative  Discussed  results of the ER PR and HER-2 testing.  She is ER negative, 5% PR positive and HER-2 negative by FISH.  Biologically her tumor is essentially behaving like a triple negative breast cancer.  I also personally reviewed PET/CT scan images and discussed findings of PET/CT scan with her.  In addition to the  hypermetabolic right breast mass and axillary adenopathy, patient also found to have hypermetabolic hilar and subcarinal adenopathy concerning for metastatic disease.  Patient does not have any known baseline lung disease.  I would therefore recommend he was guided biopsy of the hilar lymph nodes by pulmonary.  I have already discussed this with them and Dr. Ashby Dawes will be seen the patient later today and she will hopefully get her bronchoscopy ASAP.  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  In that scenario I would favor palliative first-line chemotherapy with weekly carboplatin AUC 2 IV along with Taxol at 60 mg/m IV also given weekly x12 cycles.  If she is able to tolerate chemotherapy without significant cytopenia and side effects, I will continue single agent Taxol chemotherapy and drop carboplatin after 12 cycles.    If she has stage IV triple negative breast cancer-even if she is oligo metastatic, I would still not favor doing AC chemotherapy followed by surgery.  I would favor continuing single agent Taxol chemotherapy until progression or toxicity.  Discussed risks and benefits of Taxol including all but not limited to nausea, vomiting, fatigue, hair loss, risk of infusion reactions and peripheral neuropathy.  Patient understands and agrees to proceed.  Baseline CA-27-29 was normal.  I will also send outs CA-15-3 at this time  If hilar lymph node biopsies negative for malignancy-I will still proceed with 12 cycles of weekly carbotaxol as above and repeat her PET scan  and decide about further management at that time  Given that she is less than 66 years of age with triple negative breast cancer-I will also send out invitae genetic testing for her  If hilar lymph node is positive for triple negative breast cancer will also send out foundation 1 testing on her pathology specimen  The patient will be getting port placement by Dr. Bary Castilla next week and I will plan to start chemotherapy in 1 week's time.  Chemotherapy will be given with palliative intent if she has Stage IV disease.  I will also refer the patient to Dr. Alinda Money from [facility] for second opinion for her breast cancer.   Total face to face encounter time for this patient visit was 30 min. >50% of the time was  spent in counseling and coordination of care.       Visit Diagnosis 1. Malignant neoplasm of upper-outer quadrant of right breast in female, estrogen receptor negative ([place])   2. Goals of care, counseling/discussion      Dr. Randa Evens, MD, MPH [facility] at [facility] Pager- [phone] 04/05/2017 12:22 PM

## 2017-04-05 NOTE — Progress Notes (Signed)
Patient's surgery has been scheduled for 04-09-17 at [facility].

## 2017-04-05 NOTE — Telephone Encounter (Signed)
Called in Ativan (lorazepam) 0.5 mg tablets # 30 to [facility] per request.   dhs

## 2017-04-05 NOTE — Telephone Encounter (Signed)
Pre Admit Testing has an e-bus for this PT for Dr Ashby Dawes but they have no orders for this Please call to advise  [facility] : [phone]

## 2017-04-09 ENCOUNTER — Ambulatory Visit: Payer: 59

## 2017-04-09 ENCOUNTER — Ambulatory Visit: Payer: 59 | Admitting: Registered Nurse

## 2017-04-09 ENCOUNTER — Encounter
Admission: RE | Disposition: A | Discharge status: Home / Self Care | Payer: Self-pay | Source: Ambulatory Visit | Attending: General Surgery

## 2017-04-09 ENCOUNTER — Ambulatory Visit
Admission: RE | Admit: 2017-04-09 | Discharge: 2017-04-09 | Disposition: A | Discharge status: Home / Self Care | Payer: 59 | Source: Ambulatory Visit | Attending: General Surgery | Admitting: General Surgery

## 2017-04-09 ENCOUNTER — Encounter: Payer: Self-pay | Admitting: Emergency Medicine

## 2017-04-09 DIAGNOSIS — C50911 Malignant neoplasm of unspecified site of right female breast: Secondary | ICD-10-CM | POA: Diagnosis not present

## 2017-04-09 DIAGNOSIS — E785 Hyperlipidemia, unspecified: Secondary | ICD-10-CM | POA: Insufficient documentation

## 2017-04-09 DIAGNOSIS — Z95828 Presence of other vascular implants and grafts: Secondary | ICD-10-CM

## 2017-04-09 DIAGNOSIS — N6311 Unspecified lump in the right breast, upper outer quadrant: Secondary | ICD-10-CM

## 2017-04-09 DIAGNOSIS — C50411 Malignant neoplasm of upper-outer quadrant of right female breast: Secondary | ICD-10-CM | POA: Diagnosis not present

## 2017-04-09 DIAGNOSIS — Z880 Allergy status to penicillin: Secondary | ICD-10-CM | POA: Diagnosis not present

## 2017-04-09 DIAGNOSIS — R590 Localized enlarged lymph nodes: Secondary | ICD-10-CM | POA: Insufficient documentation

## 2017-04-09 DIAGNOSIS — R911 Solitary pulmonary nodule: Secondary | ICD-10-CM

## 2017-04-09 DIAGNOSIS — Z171 Estrogen receptor negative status [ER-]: Secondary | ICD-10-CM

## 2017-04-09 HISTORY — PX: PORTACATH PLACEMENT: SHX2246

## 2017-04-09 HISTORY — PX: ENDOBRONCHIAL ULTRASOUND: SHX5096

## 2017-04-09 HISTORY — PX: BREAST BIOPSY: SHX20

## 2017-04-09 HISTORY — PX: AXILLARY LYMPH NODE BIOPSY: SHX5737

## 2017-04-09 SURGERY — INSERTION, TUNNELED CENTRAL VENOUS DEVICE, WITH PORT
Anesthesia: General | Laterality: Right | Wound class: Clean

## 2017-04-09 MED ORDER — MIDAZOLAM HCL 2 MG/2ML IJ SOLN
INTRAMUSCULAR | Status: AC
  Filled 2017-04-09: qty 2

## 2017-04-09 MED ORDER — PROPOFOL 10 MG/ML IV BOLUS
INTRAVENOUS | Status: DC | PRN
  Administered 2017-04-09: 50 mg via INTRAVENOUS

## 2017-04-09 MED ORDER — FENTANYL CITRATE (PF) 100 MCG/2ML IJ SOLN
INTRAMUSCULAR | Status: AC
  Filled 2017-04-09: qty 2

## 2017-04-09 MED ORDER — FENTANYL CITRATE (PF) 100 MCG/2ML IJ SOLN
INTRAMUSCULAR | Status: DC | PRN
  Administered 2017-04-09 (×2): 50 ug via INTRAVENOUS

## 2017-04-09 MED ORDER — DEXAMETHASONE SODIUM PHOSPHATE 10 MG/ML IJ SOLN
INTRAMUSCULAR | Status: DC | PRN
  Administered 2017-04-09: 10 mg via INTRAVENOUS

## 2017-04-09 MED ORDER — MIDAZOLAM HCL 2 MG/2ML IJ SOLN
INTRAMUSCULAR | Status: DC | PRN
  Administered 2017-04-09: 2 mg via INTRAVENOUS

## 2017-04-09 MED ORDER — FENTANYL CITRATE (PF) 100 MCG/2ML IJ SOLN: 25.0000 ug | INTRAMUSCULAR | Status: DC | PRN

## 2017-04-09 MED ORDER — ONDANSETRON HCL 4 MG/2ML IJ SOLN: 4.0000 mg | Freq: Once | INTRAMUSCULAR | Status: DC | PRN

## 2017-04-09 MED ORDER — PHENYLEPHRINE HCL 0.25 % NA SOLN
1.0000 | Freq: Four times a day (QID) | NASAL | Status: DC | PRN
  Filled 2017-04-09: qty 15

## 2017-04-09 MED ORDER — ONDANSETRON HCL 4 MG/2ML IJ SOLN
INTRAMUSCULAR | Status: DC | PRN
  Administered 2017-04-09: 4 mg via INTRAVENOUS

## 2017-04-09 MED ORDER — LIDOCAINE HCL 2 % EX GEL: 1.0000 "application " | Freq: Once | CUTANEOUS | Status: DC

## 2017-04-09 MED ORDER — LIDOCAINE HCL (CARDIAC) 20 MG/ML IV SOLN
INTRAVENOUS | Status: DC | PRN
  Administered 2017-04-09: 50 mg via INTRAVENOUS

## 2017-04-09 MED ORDER — BUTAMBEN-TETRACAINE-BENZOCAINE 2-2-14 % EX AERO
1.0000 | INHALATION_SPRAY | Freq: Once | CUTANEOUS | Status: DC
  Filled 2017-04-09: qty 20

## 2017-04-09 MED ORDER — SEVOFLURANE IN SOLN
RESPIRATORY_TRACT | Status: AC
  Filled 2017-04-09: qty 250

## 2017-04-09 MED ORDER — LIDOCAINE HCL (PF) 1 % IJ SOLN
INTRAMUSCULAR | Status: AC
  Filled 2017-04-09: qty 30

## 2017-04-09 MED ORDER — FAMOTIDINE 20 MG PO TABS
20.0000 mg | ORAL_TABLET | Freq: Once | ORAL | Status: AC
  Administered 2017-04-09: 20 mg via ORAL

## 2017-04-09 MED ORDER — FAMOTIDINE 20 MG PO TABS
ORAL_TABLET | ORAL | Status: AC
  Filled 2017-04-09: qty 1

## 2017-04-09 MED ORDER — PROPOFOL 500 MG/50ML IV EMUL
INTRAVENOUS | Status: AC
  Filled 2017-04-09: qty 50

## 2017-04-09 MED ORDER — HYDROCODONE-ACETAMINOPHEN 5-325 MG PO TABS: 1.0000 | ORAL_TABLET | ORAL | 0 refills | Status: DC | PRN

## 2017-04-09 MED ORDER — SODIUM CHLORIDE 0.9 % IJ SOLN
INTRAMUSCULAR | Status: AC
  Filled 2017-04-09: qty 50

## 2017-04-09 MED ORDER — LIDOCAINE HCL (PF) 2 % IJ SOLN
INTRAMUSCULAR | Status: AC
  Filled 2017-04-09: qty 10

## 2017-04-09 MED ORDER — LACTATED RINGERS IV SOLN
INTRAVENOUS | Status: DC
  Administered 2017-04-09: 12:00:00 via INTRAVENOUS

## 2017-04-09 MED ORDER — ROCURONIUM BROMIDE 100 MG/10ML IV SOLN
INTRAVENOUS | Status: DC | PRN
  Administered 2017-04-09: 30 mg via INTRAVENOUS
  Administered 2017-04-09: 10 mg via INTRAVENOUS

## 2017-04-09 MED ORDER — SUGAMMADEX SODIUM 500 MG/5ML IV SOLN
INTRAVENOUS | Status: DC | PRN
  Administered 2017-04-09: 200 mg via INTRAVENOUS

## 2017-04-09 MED ORDER — SUCCINYLCHOLINE CHLORIDE 20 MG/ML IJ SOLN
INTRAMUSCULAR | Status: DC | PRN
  Administered 2017-04-09: 100 mg via INTRAVENOUS

## 2017-04-09 MED ORDER — BUPIVACAINE HCL (PF) 0.5 % IJ SOLN
INTRAMUSCULAR | Status: AC
  Filled 2017-04-09: qty 30

## 2017-04-09 MED ORDER — CEFAZOLIN SODIUM-DEXTROSE 2-3 GM-%(50ML) IV SOLR
INTRAVENOUS | Status: DC | PRN
  Administered 2017-04-09: 2 g via INTRAVENOUS

## 2017-04-09 MED ORDER — BUPIVACAINE HCL (PF) 0.5 % IJ SOLN
INTRAMUSCULAR | Status: DC | PRN
  Administered 2017-04-09: 20 mL

## 2017-04-09 MED ORDER — CEFAZOLIN SODIUM-DEXTROSE 2-4 GM/100ML-% IV SOLN
INTRAVENOUS | Status: AC
  Filled 2017-04-09: qty 100

## 2017-04-09 MED ORDER — HYDROCODONE-ACETAMINOPHEN 5-325 MG PO TABS: 1.0000 | ORAL_TABLET | ORAL | Status: DC | PRN

## 2017-04-09 SURGICAL SUPPLY — 29 items
BLADE SURG 15 STRL SS SAFETY (BLADE) ×4 IMPLANT
CHLORAPREP W/TINT 26ML (MISCELLANEOUS) ×4 IMPLANT
COVER LIGHT HANDLE STERIS (MISCELLANEOUS) ×8 IMPLANT
DECANTER SPIKE VIAL GLASS SM (MISCELLANEOUS) ×8 IMPLANT
DRAPE C-ARM XRAY 36X54 (DRAPES) ×4 IMPLANT
DRAPE LAPAROTOMY TRNSV 106X77 (MISCELLANEOUS) ×4 IMPLANT
DRSG TEGADERM 2-3/8X2-3/4 SM (GAUZE/BANDAGES/DRESSINGS) ×4 IMPLANT
DRSG TEGADERM 4X4.75 (GAUZE/BANDAGES/DRESSINGS) ×4 IMPLANT
DRSG TELFA 4X3 1S NADH ST (GAUZE/BANDAGES/DRESSINGS) ×4 IMPLANT
ELECT REM PT RETURN 9FT ADLT (ELECTROSURGICAL) ×4
ELECTRODE REM PT RTRN 9FT ADLT (ELECTROSURGICAL) ×3 IMPLANT
GLOVE BIO SURGEON STRL SZ7.5 (GLOVE) ×4 IMPLANT
GLOVE INDICATOR 8.0 STRL GRN (GLOVE) ×4 IMPLANT
GOWN STRL REUS W/ TWL LRG LVL3 (GOWN DISPOSABLE) ×6 IMPLANT
GOWN STRL REUS W/TWL LRG LVL3 (GOWN DISPOSABLE) ×2
KIT PORT POWER 8FR ISP CVUE (Miscellaneous) ×4 IMPLANT
KIT RM TURNOVER STRD PROC AR (KITS) ×4 IMPLANT
LABEL OR SOLS (LABEL) ×4 IMPLANT
NS IRRIG 500ML POUR BTL (IV SOLUTION) ×4 IMPLANT
PACK PORT-A-CATH (MISCELLANEOUS) ×4 IMPLANT
STRIP CLOSURE SKIN 1/2X4 (GAUZE/BANDAGES/DRESSINGS) ×4 IMPLANT
SUT PROLENE 3 0 SH DA (SUTURE) ×4 IMPLANT
SUT VIC AB 3-0 SH 27 (SUTURE) ×1
SUT VIC AB 3-0 SH 27X BRD (SUTURE) ×3 IMPLANT
SUT VIC AB 4-0 FS2 27 (SUTURE) ×4 IMPLANT
SWABSTK COMLB BENZOIN TINCTURE (MISCELLANEOUS) ×4 IMPLANT
SYR 10ML SLIP (SYRINGE) ×4 IMPLANT
SYRINGE 10CC LL (SYRINGE) ×4 IMPLANT
SYS BIOPSY MAX CORE 14GX10 (NEEDLE) ×4 IMPLANT

## 2017-04-09 NOTE — Anesthesia Post-op Follow-up Note (Signed)
Anesthesia QCDR form completed.        

## 2017-04-09 NOTE — Anesthesia Preprocedure Evaluation (Signed)
Anesthesia Evaluation  Patient identified by MRN, date of birth, ID band Patient awake    Reviewed: Allergy & Precautions, H&P , NPO status , Patient's Chart, lab work & pertinent test results, reviewed documented beta blocker date and time   Airway Mallampati: II  TM Distance: >3 FB Neck ROM: full    Dental  (+) Teeth Intact   Pulmonary neg shortness of breath,    Pulmonary exam normal        Cardiovascular Exercise Tolerance: Good negative cardio ROS Normal cardiovascular exam Rhythm:regular Rate:Normal     Neuro/Psych negative neurological ROS  negative psych ROS   GI/Hepatic negative GI ROS, Neg liver ROS,   Endo/Other  negative endocrine ROS  Renal/GU negative Renal ROS  negative genitourinary   Musculoskeletal   Abdominal   Peds  Hematology negative hematology ROS (+) anemia ,   Anesthesia Other Findings Past Medical History: No date: Anemia No date: Breast cyst, right     Comment:  aspirated by Dr. Bary Castilla No date: Hyperlipidemia No date: Pre-diabetes Past Surgical History: 11/2009: BREAST CYST ASPIRATION; Right     Comment:  Dr. Bary Castilla did FNA 2011: COLONOSCOPY No date: DILATION AND CURETTAGE OF UTERUS     Comment:  X3 No date: ENDOMETRIAL ABLATION 09/2009: ENDOMETRIAL BIOPSY BMI    Body Mass Index:  33.83 kg/m     Reproductive/Obstetrics negative OB ROS                             Anesthesia Physical Anesthesia Plan  ASA: II  Anesthesia Plan:    Post-op Pain Management:    Induction:   PONV Risk Score and Plan: 3 and Dexamethasone, Midazolam and Ondansetron  Airway Management Planned:   Additional Equipment:   Intra-op Plan:   Post-operative Plan:   Informed Consent: I have reviewed the patients History and Physical, chart, labs and discussed the procedure including the risks, benefits and alternatives for the proposed anesthesia with the patient or  authorized representative who has indicated his/her understanding and acceptance.   Dental Advisory Given  Plan Discussed with: CRNA  Anesthesia Plan Comments:         Anesthesia Quick Evaluation

## 2017-04-09 NOTE — Procedures (Signed)
  [facility]           EBUS  Bronchoscopy Note   FINDINGS/SUMMARY:   -Multiple endobronchial lesions seen in the right upper lobe bronchus, endobronchial forceps biopsies were taken of these lesions, in addition right upper lobe brushing was taken in this area, bronchoalveolar lavage was also taken of the right upper lobe. -EBUS guided needle biopsy taken of enlarged subcarinal lymph nodes, as well as a small right hilar lymph node, with good returns. -No other endobronchial lesions are endobronchial abnormalities were noted, bronchoalveolar lavage was performed in the lingula.   Indication: Mediastinal lymphadenopathy. The patient (or their representative) was informed of the risks (including but not limited to bleeding, infection, respiratory failure, lung injury, tooth/oral injury) and benefits of the procedure and gave consent, see chart.   Pre-op diagnosis: Mediastinal lymphadenopathy Post-op diagnosis: Same, with right upper lobe endobronchial lesions. Estimated blood loss: 10 cc  Medications for procedure: By anesthesia  Procedure description: After obtaining informed consent, a timeout was called to confirm the patient and the procedure.  Patient was intubated by anesthesia services please see their note for further details.  The EBUS scope was taken to the trachea, the right 4R area was scanned there was minimal lymphadenopathy here that was accessible by EBUS biopsy.  Scope was then taken to the right mainstem, there was enlarged subcarinal lymphadenopathy that was noted.  3 passes were taken with good returns.  The scope was then taken to the right hilar area there was narrowing lymph node seen in this area with a small window.  One pass was taken in this area, as it was a small node with a narrow window and very adjacent to a vessel and lung.  The EBUS scope was then removed, the white light bronchoscope was then passed, an anatomical tunnel was undertaken there  was moderate mucosal secretions throughout both lungs which were suctioned without too much difficulty.  There was moderate mucosal edema and erythema throughout both lungs.  There was a large mucous glands that were consistent with a diagnosis of chronic bronchitis. On entering the right upper lobe bronchus there were several small rounded, pearlescent lesions which were seen, attached to the bronchial mucosal walls with a narrow base.  They were at the entry of the right upper lobe bronchus, with approximately 20% obstruction, and continue to the entrance of the anterior segment of the right upper lobe bronchus.  They appeared cystic in nature and were somewhat hard. Several endobronchial forceps biopsies were taken of these lesions with good returns, then brushings were taken, followed by bronchoalveolar lavage.  The bronchoscope was then taken to the left lingula, where bronchoalveolar lavage was again performed.  Is adequate samples have been obtained at that time, the endoscope was removed. The patient's family was updated.  Postoperative chest x-ray is pending.   Condition post procedure: stable.    Complications: none noted     Deep Ashby Dawes, MD.  Board Certified in Internal Medicine, Pulmonary Medicine, [place], and Sleep Medicine.  [facility] Office Number: [phone]  Patricia Pesa, M.D.  Cheral Marker, M.D  04/09/2017

## 2017-04-09 NOTE — Interval H&P Note (Signed)
History and Physical Interval Note:  04/09/2017 11:21 AM  Ashley Molina  has presented today for surgery, with the diagnosis of BREAST CANCER  The various methods of treatment have been discussed with the patient and family. After consideration of risks, benefits and other options for treatment, the patient has consented to  Procedure(s): INSERTION PORT-A-CATH (Left) ENDOBRONCHIAL ULTRASOUND (N/A) as a surgical intervention .  The patient's history has been reviewed, patient examined, no change in status, stable for surgery.  I have reviewed the patient's chart and labs.  Questions were answered to the patient's satisfaction.     Laverle Hobby

## 2017-04-09 NOTE — Transfer of Care (Signed)
Immediate Anesthesia Transfer of Care Note  Patient: Ashley Molina  Procedure(s) Performed: INSERTION PORT-A-CATH (Left ) AXILLARY LYMPH NODE BIOPSY (Right ) ENDOBRONCHIAL ULTRASOUND (N/A )  Patient Location: PACU  Anesthesia Type:General  Level of Consciousness: awake and alert   Airway & Oxygen Therapy: Patient Spontanous Breathing  Post-op Assessment: Report given to RN  Post vital signs: Reviewed and stable  Last Vitals:  Vitals:   04/09/17 1500 04/09/17 1501  BP:  128/72  Pulse:  88  Resp: (P) 14   Temp: (!) (P) 36.4 C 37 C  SpO2:  98%    Last Pain:  Vitals:   04/09/17 1123  TempSrc: Oral         Complications: No apparent anesthesia complications

## 2017-04-09 NOTE — Op Note (Addendum)
Preoperative diagnosis: Right breast cancer.  Need for central venous access.  Postoperative diagnosis: Same.  Operative procedure: 1) left subclavian PowerPort placement with ultrasound and fluoroscopic guidance; 2) right axillary node biopsy.  Operating Surgeon: Hervey Ard, MD.  Anesthesia: General endotracheal, Marcaine 0.5% with 1-200,000 units of epinephrine, 20 cc.  Estimated blood loss: Less than 5 cc.  Clinical note: This 56 year old woman was recently diagnosed with breast cancer and imaging suggested advanced disease requiring neoadjuvant chemotherapy.  She is scheduled today for port placement, node biopsy and subsequent endoscopic ultrasound examination by bronchoscopy.  That procedure will be dictated separately.  Operative note: The patient received Keflex prior to the procedure without incident.  The left chest and neck was cleansed with ChloraPrep and draped.  Ultrasound was used to confirm patency of the subclavian vessel.  The subclavian vein was cannulated and a guidewire advanced under fluoroscopy to the right atrium.  The dilator was placed followed by the catheter.  This was positioned at the junction of the right atrium and superior vena cava.  It was tunneled to a pocket on the left anterior chest where the port was anchored to the deep tissue with 3-0 Prolene simple sutures.  The port easily irrigated and aspirated in this position.  The adipose tissue was closed with a running 3-0 Vicryl suture.  The skin closed with a running 4-0 Vicryl septic with suture.  Benzoin and Steri-Strips followed by Telfa and Tegaderm dressing applied.  Attention was turned to the right axilla.  The area was cleansed with ChloraPrep and local anesthetic infiltrated.  A small stab incision was made.  The largest, lowest axillary node was identified in 3 core samples obtained with a Bard spring-loaded biopsy device.  No bleeding was noted.  The skin defect was closed with benzoin and  Steri-Strip.  A biopsy clip was not placed as it was not available in the operating room.  The patient tolerated procedure well and was turned over to the pulmonary service for her EBUS procedure.  Erect CXR obtained in the RR showed the catheter tip as described above and no evidence of pneumothorax.

## 2017-04-09 NOTE — H&P (Signed)
Patient with advanced breast cancer. For port placement and repeat node biopsy. EBUS to assess para trachea nodes to follow.

## 2017-04-09 NOTE — Anesthesia Procedure Notes (Signed)
Procedure Name: Intubation Date/Time: 04/09/2017 1:05 PM Performed by: Lesle Reek, CRNA Pre-anesthesia Checklist: Patient identified, Emergency Drugs available, Suction available and Patient being monitored Patient Re-evaluated:Patient Re-evaluated prior to induction Oxygen Delivery Method: Circle system utilized Preoxygenation: Pre-oxygenation with 100% oxygen Induction Type: IV induction Laryngoscope Size: Mac and 3 Grade View: Grade II Tube type: Oral Tube size: 7.0 mm Number of attempts: 1 Airway Equipment and Method: Stylet Placement Confirmation: ETT inserted through vocal cords under direct vision,  positive ETCO2,  CO2 detector and breath sounds checked- equal and bilateral Secured at: 22 cm Tube secured with: Tape

## 2017-04-10 ENCOUNTER — Encounter: Payer: Self-pay | Admitting: General Surgery

## 2017-04-10 ENCOUNTER — Inpatient Hospital Stay: Payer: 59

## 2017-04-10 ENCOUNTER — Telehealth: Payer: Self-pay | Admitting: Internal Medicine

## 2017-04-10 NOTE — Telephone Encounter (Signed)
Returned call to [facility] lab. They have 2 separate samples (1) Lingular and (2) RUL. They need to know which test need to to ran on which sample. This message has been brought to Dr. Mathis Fare attention to change orders. Nothing further needed.

## 2017-04-10 NOTE — Telephone Encounter (Signed)
The [facility] is calling They have 2 samples and now have 2 separate orders Needs some clarification Please call to advise and may speak with Threasa Beards or Percell Locus  [phone]

## 2017-04-10 NOTE — Telephone Encounter (Addendum)
New orders---Lingular Old orders-- RUL Lab notified of changes.

## 2017-04-10 NOTE — Progress Notes (Signed)
Thanks

## 2017-04-11 ENCOUNTER — Other Ambulatory Visit: Payer: Self-pay | Admitting: Pathology

## 2017-04-11 ENCOUNTER — Other Ambulatory Visit: Payer: Self-pay | Admitting: *Deleted

## 2017-04-11 DIAGNOSIS — C50411 Malignant neoplasm of upper-outer quadrant of right female breast: Secondary | ICD-10-CM

## 2017-04-11 DIAGNOSIS — Z171 Estrogen receptor negative status [ER-]: Principal | ICD-10-CM

## 2017-04-11 LAB — ACID FAST SMEAR (AFB, MYCOBACTERIA)

## 2017-04-11 LAB — CYTOLOGY - NON PAP

## 2017-04-11 LAB — ACID FAST SMEAR (AFB): ACID FAST SMEAR - AFSCU2: NEGATIVE

## 2017-04-11 LAB — SURGICAL PATHOLOGY

## 2017-04-12 ENCOUNTER — Other Ambulatory Visit: Payer: Self-pay

## 2017-04-12 ENCOUNTER — Inpatient Hospital Stay: Payer: 59

## 2017-04-12 ENCOUNTER — Inpatient Hospital Stay: Payer: 59 | Admitting: Oncology

## 2017-04-12 VITALS — BP 123/83 | HR 73 | Temp 97.8°F | Resp 14 | Wt 193.0 lb

## 2017-04-12 VITALS — BP 108/73 | HR 70 | Resp 20

## 2017-04-12 DIAGNOSIS — Z171 Estrogen receptor negative status [ER-]: Secondary | ICD-10-CM

## 2017-04-12 DIAGNOSIS — E785 Hyperlipidemia, unspecified: Secondary | ICD-10-CM

## 2017-04-12 DIAGNOSIS — Z5111 Encounter for antineoplastic chemotherapy: Secondary | ICD-10-CM

## 2017-04-12 DIAGNOSIS — Z79899 Other long term (current) drug therapy: Secondary | ICD-10-CM

## 2017-04-12 DIAGNOSIS — C50411 Malignant neoplasm of upper-outer quadrant of right female breast: Secondary | ICD-10-CM | POA: Diagnosis not present

## 2017-04-12 LAB — CULTURE, RESPIRATORY: CULTURE: NO GROWTH

## 2017-04-12 MED ORDER — SODIUM CHLORIDE 0.9 % IV SOLN
257.0000 mg | Freq: Once | INTRAVENOUS | Status: AC
  Administered 2017-04-12: 260 mg via INTRAVENOUS
  Filled 2017-04-12: qty 26

## 2017-04-12 MED ORDER — PACLITAXEL CHEMO INJECTION 300 MG/50ML
80.0000 mg/m2 | Freq: Once | INTRAVENOUS | Status: AC
  Administered 2017-04-12: 156 mg via INTRAVENOUS
  Filled 2017-04-12: qty 26

## 2017-04-12 MED ORDER — SODIUM CHLORIDE 0.9 % IV SOLN
Freq: Once | INTRAVENOUS | Status: AC
  Administered 2017-04-12: 11:00:00 via INTRAVENOUS
  Filled 2017-04-12: qty 1000

## 2017-04-12 MED ORDER — PALONOSETRON HCL INJECTION 0.25 MG/5ML
0.2500 mg | Freq: Once | INTRAVENOUS | Status: AC
  Administered 2017-04-12: 0.25 mg via INTRAVENOUS
  Filled 2017-04-12: qty 5

## 2017-04-12 MED ORDER — FAMOTIDINE IN NACL 20-0.9 MG/50ML-% IV SOLN
20.0000 mg | Freq: Once | INTRAVENOUS | Status: AC
  Administered 2017-04-12: 20 mg via INTRAVENOUS
  Filled 2017-04-12: qty 50

## 2017-04-12 MED ORDER — DIPHENHYDRAMINE HCL 50 MG/ML IJ SOLN
50.0000 mg | Freq: Once | INTRAMUSCULAR | Status: AC
  Administered 2017-04-12: 50 mg via INTRAVENOUS
  Filled 2017-04-12: qty 1

## 2017-04-12 MED ORDER — SODIUM CHLORIDE 0.9 % IV SOLN
20.0000 mg | Freq: Once | INTRAVENOUS | Status: AC
  Administered 2017-04-12: 20 mg via INTRAVENOUS
  Filled 2017-04-12: qty 2

## 2017-04-12 MED ORDER — HEPARIN SOD (PORK) LOCK FLUSH 100 UNIT/ML IV SOLN
500.0000 [IU] | Freq: Once | INTRAVENOUS | Status: AC | PRN
  Administered 2017-04-12: 500 [IU]
  Filled 2017-04-12: qty 5

## 2017-04-12 NOTE — Progress Notes (Signed)
Patient here for follow up and first treatment with Carboplatin and Taxol. Her labs were done at [facility] and we have the results. She had her port placed on Monday, so it is still a little sore, but she otherwise denies having any pain.

## 2017-04-12 NOTE — Progress Notes (Signed)
Hematology/Oncology Consult note [facility]  Telephone:([phone] [phone]  Patient Care Team: Patient, No Pcp Per as PCP - General (General Practice) Byrnett, Forest Gleason, MD (General Surgery) Gae Dry, MD as Referring Physician (Obstetrics and Gynecology)   Name of the patient: Ashley Molina  [id number]  [birth year]   Date of visit: 04/12/17  Diagnosis- Stage IV invasive mammary carcinoma of the right breast [id number] ER negative, PR 5% positive and HER-2/neu negative  Chief complaint/ Reason for visit- discuss PET/CT results and further management  Heme/Onc history: 1. Patient is a 56 year old postmenopausal female who had a routine screening mammogram on 03/23/2017 which showed a highly suspicious palpable solid mass in the 10 o'clock position. Mass measures at least 5 cm in maximum diameter for right axillary lymph nodes have sonographic features suspicious for metastatic disease. No evidence of malignancy was noted in the left breast. This was followed by an ultrasound of the right breast including the right axilla which confirmed the same findings.Targeted ultrasound is performed, showing a markedly irregular and hypoechoic mass centered at 10 o'clock position approximately 7 cm from the nipple estimated to be at least 5.2 x 4.4 x 3.4 cm. Small areas of vascular flow are detected within the mass. At 10 o'clock position 9 cm from the nipple again noted is a simple cyst measuring approximately 2.4 cm greatest diameter. Skin thickness of the medial and lateral of right breast measures approximately 4 mm.  Ultrasound of the right axilla demonstrates 4 suspicious lymph nodes with hypoechoic thickened cortices. The largest of these lymph nodes measures 2.2 x 1.2 x 1.0 cm and has a cortical thickness of 4-5 mm.  Patient underwent core biopsy of the right breast mass.  2. 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  Right axillary lymph node biopsy was also consistent with metastatic carcinoma. ER negative PR 5% positive,and HER-2 negative by FISH  3. Patient is G3P3L3. Menarche at the age of 28. Menopause at 71. No priro h/o breast biopsies. No family h/o breast cancer. She was known to have right breast cyst being followed by Dr. Bary Castilla. She noticed this amss sometime over the summer and feels this has grown since then. She is healthy and has no significant co-morbidities  4. PET/CT scan on 04/04/17 showed: IMPRESSION: 1. Hypermetabolic right breast mass with hypermetabolic lymph nodes in the right axilla and hypermetabolic metastatic disease in both hilar regions and subcarinal mediastinum. 2. No evidence for hypermetabolic metastatic disease in the neck, abdomen or pelvis  5. Patient underwent bronchoscopy and biopsy of hilar LN which was positive for metastatic carcinoma from the breast. She also underwent RUl biopsy and bronchial washings that were negative for malignancy  Interval history- she feels well today. Denies any complaints  ECOG PS- 0 Pain scale- 0   Review of systems- Review of Systems  Constitutional: Negative for chills, fever, malaise/fatigue and weight loss.  HENT: Negative for congestion, ear discharge and nosebleeds.   Eyes: Negative for blurred vision.  Respiratory: Negative for cough, hemoptysis, sputum production, shortness of breath and wheezing.   Cardiovascular: Negative for chest pain, palpitations, orthopnea and claudication.  Gastrointestinal: Negative for abdominal pain, blood in stool, constipation, diarrhea, heartburn, melena, nausea and vomiting.  Genitourinary: Negative for dysuria, flank pain, frequency, hematuria and urgency.  Musculoskeletal: Negative for back pain, joint pain and myalgias.  Skin: Negative for rash.  Neurological: Negative for dizziness, tingling, focal weakness, seizures, weakness and headaches.  Endo/Heme/Allergies: Does not  bruise/bleed easily.  Psychiatric/Behavioral: Negative for depression and suicidal ideas. The patient does not have insomnia.       Allergies  Allergen Reactions  . Penicillins Rash     Past Medical History:  Diagnosis Date  . Anemia   . Breast cyst, right    aspirated by Dr. Bary Castilla  . Hyperlipidemia   . Pre-diabetes      Past Surgical History:  Procedure Laterality Date  . BREAST CYST ASPIRATION Right 11/2009   Dr. Bary Castilla did FNA  . COLONOSCOPY  2011  . DILATION AND CURETTAGE OF UTERUS     X3  . ENDOMETRIAL ABLATION    . ENDOMETRIAL BIOPSY  09/2009    Social History   Socioeconomic History  . Marital status: Married    Spouse name: Not on file  . Number of children: Not on file  . Years of education: Not on file  . Highest education level: Not on file  Social Needs  . Financial resource strain: Not on file  . Food insecurity - worry: Not on file  . Food insecurity - inability: Not on file  . Transportation needs - medical: Not on file  . Transportation needs - non-medical: Not on file  Occupational History  . Not on file  Tobacco Use  . Smoking status: Never Smoker  . Smokeless tobacco: Never Used  Substance and Sexual Activity  . Alcohol use: Yes    Alcohol/week: 1.2 oz    Types: 2 Glasses of wine per week  . Drug use: No  . Sexual activity: Not Currently  Other Topics Concern  . Not on file  Social History Narrative  . Not on file    Family History  Problem Relation Age of Onset  . Melanoma Maternal Grandmother 44  . Brain cancer Maternal Grandfather 78  . Melanoma Other 90     Current Outpatient Medications:  .  lidocaine-prilocaine (EMLA) cream, Apply to affected area once, Disp: 30 g, Rfl: 3 .  Biotin 2500 MCG CAPS, Take 5,000 mcg/day by mouth daily., Disp: , Rfl:  .  Cholecalciferol (VITAMIN D3) 10000 units TABS, Take by mouth 1 day or 1 dose., Disp: , Rfl:  .  dexamethasone (DECADRON) 4 MG tablet, Take 2 tablets (8 mg total) by  mouth daily. Start the day after chemotherapy for 2 days. (Patient not taking: Reported on 04/12/2017), Disp: 30 tablet, Rfl: 1 .  HYDROcodone-acetaminophen (NORCO) 5-325 MG tablet, Take 1-2 tablets every 4 (four) hours as needed by mouth. Take no more than 10 tablets daily. (Patient not taking: Reported on 04/12/2017), Disp: 30 tablet, Rfl: 0 .  LORazepam (ATIVAN) 0.5 MG tablet, Take 1 tablet (0.5 mg total) by mouth every 6 (six) hours as needed (Nausea or vomiting). (Patient not taking: Reported on 04/12/2017), Disp: 30 tablet, Rfl: 0 .  Multiple Vitamins-Minerals (EMERGEN-C IMMUNE PLUS PO), Take 1 packet by mouth 1 day or 1 dose. With energy, Disp: , Rfl:  .  omega-3 acid ethyl esters (LOVAZA) 1 g capsule, Take by mouth 1 day or 1 dose., Disp: , Rfl:  .  ondansetron (ZOFRAN) 8 MG tablet, Take 1 tablet (8 mg total) by mouth 2 (two) times daily as needed for refractory nausea / vomiting. Start on day 3 after chemo. (Patient not taking: Reported on 04/12/2017), Disp: 30 tablet, Rfl: 1 .  prochlorperazine (COMPAZINE) 10 MG tablet, Take 1 tablet (10 mg total) by mouth every 6 (six) hours as needed (Nausea or vomiting). (Patient not taking: Reported  on 04/12/2017), Disp: 30 tablet, Rfl: 1 .  vitamin E 400 UNIT capsule, Take 400 Units by mouth daily., Disp: , Rfl:  No current facility-administered medications for this visit.   Facility-Administered Medications Ordered in Other Visits:  .  CARBOplatin (PARAPLATIN) 260 mg in sodium chloride 0.9 % 250 mL chemo infusion, 260 mg, Intravenous, Once, Sindy Guadeloupe, MD .  heparin lock flush 100 unit/mL, 500 Units, Intracatheter, Once PRN, Sindy Guadeloupe, MD  Physical exam:  Vitals:   04/12/17 0951  BP: 123/83  Pulse: 73  Resp: 14  Temp: 97.8 F (36.6 C)  TempSrc: Tympanic  Weight: 193 lb (87.5 kg)   Physical Exam  Constitutional: She is oriented to person, place, and time and well-developed, well-nourished, and in no distress.  HENT:  Head: Normocephalic  and atraumatic.  Eyes: EOM are normal. Pupils are equal, round, and reactive to light.  Neck: Normal range of motion.  Cardiovascular: Normal rate, regular rhythm and normal heart sounds.  Pulmonary/Chest: Effort normal and breath sounds normal.  Left chest wall port in place  Abdominal: Soft. Bowel sounds are normal.  Neurological: She is alert and oriented to person, place, and time.  Skin: Skin is warm and dry.     CMP Latest Ref Rng & Units 03/28/2017  Glucose 65 - 99 mg/dL 102(H)  BUN 6 - 24 mg/dL 15  Creatinine 0.57 - 1.00 mg/dL 0.82  Sodium 134 - 144 mmol/L 140  Potassium 3.5 - 5.2 mmol/L 4.1  Chloride 96 - 106 mmol/L 106  CO2 20 - 29 mmol/L 22  Calcium 8.7 - 10.2 mg/dL 9.3  Total Protein 6.0 - 8.5 g/dL 6.9  Total Bilirubin 0.0 - 1.2 mg/dL 0.4  Alkaline Phos 39 - 117 IU/L 89  AST 0 - 40 IU/L 19  ALT 0 - 32 IU/L 22   CBC Latest Ref Rng & Units 03/28/2017  WBC 3.4 - 10.8 x10E3/uL 4.3  Hemoglobin 11.1 - 15.9 g/dL 12.9  Hematocrit 34.0 - 46.6 % 38.2  Platelets 150 - 379 x10E3/uL 245    No images are attached to the encounter.  Nm Pet Image Initial (pi) Skull Base To Thigh  Result Date: 04/04/2017 CLINICAL DATA:  Initial treatment strategy for right-sided breast cancer. EXAM: NUCLEAR MEDICINE PET SKULL BASE TO THIGH TECHNIQUE: 12.7 mCi F-18 FDG was injected intravenously. Full-ring PET imaging was performed from the skull base to thigh after the radiotracer. CT data was obtained and used for attenuation correction and anatomic localization. FASTING BLOOD GLUCOSE:  Value: 96 mg/dl COMPARISON:  None. FINDINGS: NECK: No hypermetabolic lymph nodes in the neck. CHEST: Irregular right breast mass is hypermetabolic with SUV max = 7.2. Hypermetabolic nodal disease in the right axilla identified with SUV max = 5.3. Although not well demonstrated on noncontrast CT imaging for attenuation correction, there is hypermetabolic lymphadenopathy in both hilar regions and in the subcarinal  station. Index hypermetabolism in the right hilum demonstrates SUV max = 6.9. Compressive atelectasis noted in the lung bases on CT imaging. ABDOMEN/PELVIS: No abnormal hypermetabolic activity within the liver, pancreas, adrenal glands, or spleen. No hypermetabolic lymph nodes in the abdomen or pelvis. There is abdominal aortic atherosclerosis without aneurysm. Small umbilical and paraumbilical hernias contain only fat. SKELETON: No focal hypermetabolic activity to suggest skeletal metastasis. IMPRESSION: 1. Hypermetabolic right breast mass with hypermetabolic lymph nodes in the right axilla and hypermetabolic metastatic disease in both hilar regions and subcarinal mediastinum. 2. No evidence for hypermetabolic metastatic disease in the neck,  abdomen or pelvis. Electronically Signed   By: Misty Stanley M.D.   On: 04/04/2017 11:22   Dg Chest Port 1 View  Result Date: 04/10/2017 CLINICAL DATA:  Port-A-Cath insertion EXAM: PORTABLE CHEST 1 VIEW COMPARISON:  Portable exam 1532 hours without priors for comparison FINDINGS: LEFT subclavian Port-A-Cath with tip projecting over SVC. Normal heart size, mediastinal contours, and pulmonary vascularity. Peribronchial thickening with diffuse accentuation of interstitial markings. Subsegmental atelectasis at RIGHT middle lobe. No definite acute infiltrate, pleural effusion or pneumothorax. Osseous structures unremarkable. IMPRESSION: No pneumothorax following LEFT subclavian Port-A-Cath insertion. Bronchitic changes with diffuse interstitial prominence of uncertain acuity ; this could represent chronic interstitial disease, mild pulmonary edema or atypical infection. Subsegmental atelectasis at the RIGHT middle lobe. Electronically Signed   By: Lavonia Dana M.D.   On: 04/10/2017 08:57   Dg C-arm 1-60 Min-no Report  Result Date: 04/09/2017 Fluoroscopy was utilized by the requesting physician.  No radiographic interpretation.   US Breast Complete Uni Right Inc  Axilla  Result Date: 03/27/2017 Indications for biopsy of both the dominant mass in the right breast as well as the enlarged axillary lymph node were reviewed with the patient and she was amenable to proceed. Examination of the axilla showed at least 1 dominant lymph node measuring up to 2.16 cm in diameter. The area was infiltrated with 10 mL of 0.5% Xylocaine with 0.25% Marcaine with 1 200,000 of epinephrine. A 14-gauge spring-loaded core biopsy device was used and 3 core samples were obtained. A postbiopsy clip was placed. This was completed in a transverse orientation from posterior to anterior. No bleeding noted. The right breast mass is a spiculated hypoechoic mass measuring at least 3.69 x 3.74 x 7.2 cm. The patient received an additional 10 mL of 0.5% Xylocaine with 0.25% Marcaine with 1-200,000 epinephrine. Again using a 14-gauge core biopsy device, and using a lateral to medial approach 4 core samples were obtained after skin incision with an 11-gauge blade through various locations in the lesion. A postbiopsy clip was placed. There was about 10 mL of bleeding that was controlled with direct pressure. Both skin defects were closed with benzoin and Steri-Strips. Telfa and Tegaderm dressings applied.   US Breast Ltd Uni Left Inc Axilla  Result Date: 03/23/2017 CLINICAL DATA:  56 year old patient presents for evaluation of an enlarging mass in the upper-outer quadrant of the right breast that she noticed began increasing in size this summer. She has a known palpable simple benign cyst in the upper-outer quadrant of the right breast previously evaluated by mammogram and ultrasound. This cyst was last evaluated in August of 2016. EXAM: 2D DIGITAL DIAGNOSTIC BILATERAL MAMMOGRAM WITH CAD AND ADJUNCT TOMO ULTRASOUND BILATERAL BREAST COMPARISON:  Previous exam(s). ACR Breast Density Category b: There are scattered areas of fibroglandular density. FINDINGS: Metallic skin marker was placed over the palpable  area of concern in the right breast. There is a dominant spiculated mass in the upper-outer quadrant of the right breast that is adjacent to but separate from the stable circumscribed previously documented cyst in this region of the right breast. The spiculated mass measures at least 5 cm maximum transverse diameter and craniocaudal span. Anterior to posterior, the mass measures approximately 4.5 cm. There is new mild skin thickening of the right breast. No suspicious microcalcifications on the right. Question cortical thickening of a right axillary lymph node. A small asymmetry in the upper-outer quadrant of the left breast was evaluated with additional spot compression and 90 degree lateral view and  has appearances consistent with normal fibroglandular tissue. No mass, distortion, or suspicious microcalcification is identified in the left breast. Mammographic images were processed with CAD. On physical exam, there is a mildly protuberant and very firm palpable mass in the upper-outer quadrant of the right breast centered at 10 o'clock position approximately 7 cm from the nipple. On physical exam the mass measures approximately 6-7 cm. There is evidence of slight skin thickening in the medial and lateral aspects of the right breast. The nipple appears normal. I do not palpate any discrete lymphadenopathy in the right axilla. Targeted ultrasound is performed, showing a markedly irregular and hypoechoic mass centered at 10 o'clock position approximately 7 cm from the nipple estimated to be at least 5.2 x 4.4 x 3.4 cm. Small areas of vascular flow are detected within the mass. At 10 o'clock position 9 cm from the nipple again noted is a simple cyst measuring approximately 2.4 cm greatest diameter. Skin thickness of the medial and lateral of right breast measures approximately 4 mm. Ultrasound of the right axilla demonstrates 4 suspicious lymph nodes with hypoechoic thickened cortices. The largest of these lymph nodes  measures 2.2 x 1.2 x 1.0 cm and has a cortical thickness of 4-5 mm. IMPRESSION: 1. Highly suspicious palpable solid mass centered in the 10 o'clock position of the right breast 7 cm from the nipple. The size of the mass is likely best estimated by the mammogram, and measures at least 5 cm maximum diameter. There is diffuse mild skin thickening of the right breast. 2. Four right axillary lymph nodes have sonographic features suspicious for metastatic disease. 3. Chronic benign simple cyst in the upper-outer quadrant of the right breast. 4. No evidence of malignancy in the left breast. RECOMMENDATION: Ultrasound-guided biopsy is recommended of the suspicious palpable mass in the 10 o'clock position the right breast. Ultrasound-guided biopsy of 1 of the suspicious right axillary lymph nodes is recommended. I have discussed the findings and recommendations with the patient. Results were also provided in writing at the conclusion of the visit. If applicable, a reminder letter will be sent to the patient regarding the next appointment. BI-RADS CATEGORY  5: Highly suggestive of malignancy. Electronically Signed   By: Curlene Dolphin M.D.   On: 03/23/2017 15:57   US Breast Ltd Uni Right Inc Axilla  Result Date: 03/23/2017 CLINICAL DATA:  56 year old patient presents for evaluation of an enlarging mass in the upper-outer quadrant of the right breast that she noticed began increasing in size this summer. She has a known palpable simple benign cyst in the upper-outer quadrant of the right breast previously evaluated by mammogram and ultrasound. This cyst was last evaluated in August of 2016. EXAM: 2D DIGITAL DIAGNOSTIC BILATERAL MAMMOGRAM WITH CAD AND ADJUNCT TOMO ULTRASOUND BILATERAL BREAST COMPARISON:  Previous exam(s). ACR Breast Density Category b: There are scattered areas of fibroglandular density. FINDINGS: Metallic skin marker was placed over the palpable area of concern in the right breast. There is a dominant  spiculated mass in the upper-outer quadrant of the right breast that is adjacent to but separate from the stable circumscribed previously documented cyst in this region of the right breast. The spiculated mass measures at least 5 cm maximum transverse diameter and craniocaudal span. Anterior to posterior, the mass measures approximately 4.5 cm. There is new mild skin thickening of the right breast. No suspicious microcalcifications on the right. Question cortical thickening of a right axillary lymph node. A small asymmetry in the upper-outer quadrant of the  left breast was evaluated with additional spot compression and 90 degree lateral view and has appearances consistent with normal fibroglandular tissue. No mass, distortion, or suspicious microcalcification is identified in the left breast. Mammographic images were processed with CAD. On physical exam, there is a mildly protuberant and very firm palpable mass in the upper-outer quadrant of the right breast centered at 10 o'clock position approximately 7 cm from the nipple. On physical exam the mass measures approximately 6-7 cm. There is evidence of slight skin thickening in the medial and lateral aspects of the right breast. The nipple appears normal. I do not palpate any discrete lymphadenopathy in the right axilla. Targeted ultrasound is performed, showing a markedly irregular and hypoechoic mass centered at 10 o'clock position approximately 7 cm from the nipple estimated to be at least 5.2 x 4.4 x 3.4 cm. Small areas of vascular flow are detected within the mass. At 10 o'clock position 9 cm from the nipple again noted is a simple cyst measuring approximately 2.4 cm greatest diameter. Skin thickness of the medial and lateral of right breast measures approximately 4 mm. Ultrasound of the right axilla demonstrates 4 suspicious lymph nodes with hypoechoic thickened cortices. The largest of these lymph nodes measures 2.2 x 1.2 x 1.0 cm and has a cortical thickness  of 4-5 mm. IMPRESSION: 1. Highly suspicious palpable solid mass centered in the 10 o'clock position of the right breast 7 cm from the nipple. The size of the mass is likely best estimated by the mammogram, and measures at least 5 cm maximum diameter. There is diffuse mild skin thickening of the right breast. 2. Four right axillary lymph nodes have sonographic features suspicious for metastatic disease. 3. Chronic benign simple cyst in the upper-outer quadrant of the right breast. 4. No evidence of malignancy in the left breast. RECOMMENDATION: Ultrasound-guided biopsy is recommended of the suspicious palpable mass in the 10 o'clock position the right breast. Ultrasound-guided biopsy of 1 of the suspicious right axillary lymph nodes is recommended. I have discussed the findings and recommendations with the patient. Results were also provided in writing at the conclusion of the visit. If applicable, a reminder letter will be sent to the patient regarding the next appointment. BI-RADS CATEGORY  5: Highly suggestive of malignancy. Electronically Signed   By: Curlene Dolphin M.D.   On: 03/23/2017 15:57   Mm Diag Breast Tomo Bilateral  Result Date: 03/23/2017 CLINICAL DATA:  56 year old patient presents for evaluation of an enlarging mass in the upper-outer quadrant of the right breast that she noticed began increasing in size this summer. She has a known palpable simple benign cyst in the upper-outer quadrant of the right breast previously evaluated by mammogram and ultrasound. This cyst was last evaluated in August of 2016. EXAM: 2D DIGITAL DIAGNOSTIC BILATERAL MAMMOGRAM WITH CAD AND ADJUNCT TOMO ULTRASOUND BILATERAL BREAST COMPARISON:  Previous exam(s). ACR Breast Density Category b: There are scattered areas of fibroglandular density. FINDINGS: Metallic skin marker was placed over the palpable area of concern in the right breast. There is a dominant spiculated mass in the upper-outer quadrant of the right breast  that is adjacent to but separate from the stable circumscribed previously documented cyst in this region of the right breast. The spiculated mass measures at least 5 cm maximum transverse diameter and craniocaudal span. Anterior to posterior, the mass measures approximately 4.5 cm. There is new mild skin thickening of the right breast. No suspicious microcalcifications on the right. Question cortical thickening of a right  axillary lymph node. A small asymmetry in the upper-outer quadrant of the left breast was evaluated with additional spot compression and 90 degree lateral view and has appearances consistent with normal fibroglandular tissue. No mass, distortion, or suspicious microcalcification is identified in the left breast. Mammographic images were processed with CAD. On physical exam, there is a mildly protuberant and very firm palpable mass in the upper-outer quadrant of the right breast centered at 10 o'clock position approximately 7 cm from the nipple. On physical exam the mass measures approximately 6-7 cm. There is evidence of slight skin thickening in the medial and lateral aspects of the right breast. The nipple appears normal. I do not palpate any discrete lymphadenopathy in the right axilla. Targeted ultrasound is performed, showing a markedly irregular and hypoechoic mass centered at 10 o'clock position approximately 7 cm from the nipple estimated to be at least 5.2 x 4.4 x 3.4 cm. Small areas of vascular flow are detected within the mass. At 10 o'clock position 9 cm from the nipple again noted is a simple cyst measuring approximately 2.4 cm greatest diameter. Skin thickness of the medial and lateral of right breast measures approximately 4 mm. Ultrasound of the right axilla demonstrates 4 suspicious lymph nodes with hypoechoic thickened cortices. The largest of these lymph nodes measures 2.2 x 1.2 x 1.0 cm and has a cortical thickness of 4-5 mm. IMPRESSION: 1. Highly suspicious palpable solid mass  centered in the 10 o'clock position of the right breast 7 cm from the nipple. The size of the mass is likely best estimated by the mammogram, and measures at least 5 cm maximum diameter. There is diffuse mild skin thickening of the right breast. 2. Four right axillary lymph nodes have sonographic features suspicious for metastatic disease. 3. Chronic benign simple cyst in the upper-outer quadrant of the right breast. 4. No evidence of malignancy in the left breast. RECOMMENDATION: Ultrasound-guided biopsy is recommended of the suspicious palpable mass in the 10 o'clock position the right breast. Ultrasound-guided biopsy of 1 of the suspicious right axillary lymph nodes is recommended. I have discussed the findings and recommendations with the patient. Results were also provided in writing at the conclusion of the visit. If applicable, a reminder letter will be sent to the patient regarding the next appointment. BI-RADS CATEGORY  5: Highly suggestive of malignancy. Electronically Signed   By: Curlene Dolphin M.D.   On: 03/23/2017 15:57     Assessment and plan- Patient is a 56 y.o. 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 She therefore has Stage IV triple negative breast cancer. She will be seeing Dr. Alinda Money at [facility] for second opinion next week. Options at this time would be to proceed with palliative chemotherapy with carbo/taxol here and discuss potential clinical trials with Dr. Alinda Money down the line versus waiting to consider clinical trials at [facility] line. Patient would like to start treatment at this time. I will therefore proceed with palliative chemotherapy with carboplatin AUC 2 along with taxol at 80 mg/ meter square. I will plan to continue carboplatin for 12 cycles if she is able to  tolerate. Discussed that chemotherapy will only be palliative and not curative. 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 [facility] and baseline CBC, CMp WNL. Marland Kitchen CA 27.29 was normal at 15.8. Will check CA 15-3  We are also working on Government social research officer for genetic testing. We are also sending [facility] one testing on her tumor specimen  I will see her back in 1 week for cycle 2 of carbo/taxol. I will hold off on getting MRI brain since she has no concerning signs/ symptoms of CNS involvement   Total face to face encounter time for this patient visit was 30 min. >50% of the time was  spent in counseling and coordination of care.     Visit Diagnosis 1. Malignant neoplasm of upper-outer quadrant of right breast in female, estrogen receptor negative ([place])   2. Encounter for antineoplastic chemotherapy      Dr. Randa Evens, MD, MPH [facility] Pager- [phone] 04/12/2017 1:35 PM

## 2017-04-13 ENCOUNTER — Encounter: Payer: Self-pay | Admitting: Oncology

## 2017-04-14 LAB — CULTURE, RESPIRATORY W GRAM STAIN

## 2017-04-14 LAB — CULTURE, RESPIRATORY

## 2017-04-15 DIAGNOSIS — C7800 Secondary malignant neoplasm of unspecified lung: Secondary | ICD-10-CM

## 2017-04-15 DIAGNOSIS — C50911 Malignant neoplasm of unspecified site of right female breast: Secondary | ICD-10-CM | POA: Insufficient documentation

## 2017-04-16 NOTE — Progress Notes (Signed)
  Oncology Nurse Navigator Documentation  Navigator Location: [facility] (04/16/17 0800)   )Navigator Encounter Type: Telephone;Clinic/MDC (04/16/17 0800) Telephone: Ashley Molina Call (04/16/17 0800)                     Treatment Phase: First Chemo Tx (04/16/17 0800)                            Time Spent with Patient: 30 (04/16/17 0800)   Met patient and husband on 04/12/17 at initial chemotherapy.  Phoned today to follow-up on treatment, and how she tolerated.  Left message.

## 2017-04-18 ENCOUNTER — Other Ambulatory Visit: Payer: Self-pay | Admitting: *Deleted

## 2017-04-18 LAB — VIRUS CULTURE

## 2017-04-19 ENCOUNTER — Inpatient Hospital Stay: Payer: 59 | Admitting: Oncology

## 2017-04-19 ENCOUNTER — Encounter: Payer: Self-pay | Admitting: Oncology

## 2017-04-19 ENCOUNTER — Telehealth: Payer: Self-pay | Admitting: *Deleted

## 2017-04-19 ENCOUNTER — Inpatient Hospital Stay: Payer: 59

## 2017-04-19 VITALS — BP 108/69 | HR 74 | Temp 98.6°F | Resp 20

## 2017-04-19 VITALS — BP 122/80 | HR 94 | Temp 98.2°F | Wt 193.0 lb

## 2017-04-19 DIAGNOSIS — E785 Hyperlipidemia, unspecified: Secondary | ICD-10-CM

## 2017-04-19 DIAGNOSIS — Z5111 Encounter for antineoplastic chemotherapy: Secondary | ICD-10-CM

## 2017-04-19 DIAGNOSIS — C771 Secondary and unspecified malignant neoplasm of intrathoracic lymph nodes: Secondary | ICD-10-CM

## 2017-04-19 DIAGNOSIS — C50411 Malignant neoplasm of upper-outer quadrant of right female breast: Secondary | ICD-10-CM | POA: Diagnosis not present

## 2017-04-19 DIAGNOSIS — Z171 Estrogen receptor negative status [ER-]: Secondary | ICD-10-CM | POA: Diagnosis not present

## 2017-04-19 DIAGNOSIS — Z79899 Other long term (current) drug therapy: Secondary | ICD-10-CM | POA: Diagnosis not present

## 2017-04-19 MED ORDER — PACLITAXEL CHEMO INJECTION 300 MG/50ML
80.0000 mg/m2 | Freq: Once | INTRAVENOUS | Status: AC
  Administered 2017-04-19: 156 mg via INTRAVENOUS
  Filled 2017-04-19: qty 26

## 2017-04-19 MED ORDER — HEPARIN SOD (PORK) LOCK FLUSH 100 UNIT/ML IV SOLN
500.0000 [IU] | Freq: Once | INTRAVENOUS | Status: AC | PRN
  Administered 2017-04-19: 500 [IU]
  Filled 2017-04-19: qty 5

## 2017-04-19 MED ORDER — SODIUM CHLORIDE 0.9 % IV SOLN
Freq: Once | INTRAVENOUS | Status: AC
  Administered 2017-04-19: 12:00:00 via INTRAVENOUS
  Filled 2017-04-19: qty 1000

## 2017-04-19 MED ORDER — FAMOTIDINE IN NACL 20-0.9 MG/50ML-% IV SOLN
20.0000 mg | Freq: Once | INTRAVENOUS | Status: AC
  Administered 2017-04-19: 20 mg via INTRAVENOUS
  Filled 2017-04-19: qty 50

## 2017-04-19 MED ORDER — PALONOSETRON HCL INJECTION 0.25 MG/5ML
0.2500 mg | Freq: Once | INTRAVENOUS | Status: AC
  Administered 2017-04-19: 0.25 mg via INTRAVENOUS
  Filled 2017-04-19: qty 5

## 2017-04-19 MED ORDER — DIPHENHYDRAMINE HCL 50 MG/ML IJ SOLN
25.0000 mg | Freq: Once | INTRAMUSCULAR | Status: AC | PRN
  Administered 2017-04-19: 25 mg via INTRAVENOUS

## 2017-04-19 MED ORDER — METHYLPREDNISOLONE SODIUM SUCC 125 MG IJ SOLR
125.0000 mg | Freq: Once | INTRAMUSCULAR | Status: AC | PRN
  Administered 2017-04-19: 125 mg via INTRAVENOUS

## 2017-04-19 MED ORDER — CARBOPLATIN CHEMO INJECTION 450 MG/45ML
260.0000 mg | Freq: Once | INTRAVENOUS | Status: AC
  Administered 2017-04-19: 260 mg via INTRAVENOUS
  Filled 2017-04-19: qty 26

## 2017-04-19 MED ORDER — DIPHENHYDRAMINE HCL 50 MG/ML IJ SOLN
50.0000 mg | Freq: Once | INTRAMUSCULAR | Status: AC
  Administered 2017-04-19: 50 mg via INTRAVENOUS
  Filled 2017-04-19: qty 1

## 2017-04-19 MED ORDER — SODIUM CHLORIDE 0.9 % IV SOLN
20.0000 mg | Freq: Once | INTRAVENOUS | Status: AC
  Administered 2017-04-19: 20 mg via INTRAVENOUS
  Filled 2017-04-19: qty 2

## 2017-04-19 NOTE — Progress Notes (Signed)
13:10 - patient getting Taxol, complains of feeling hot, chest pain, nausea, facial flushing.  Stopped Taxol, increased fluids, checked vital signs, subsequently called Dr. Janese Banks, gave patient SoluMedrol and Benadryl (see MAR). Dr. Janese Banks examined patient, wants to restart Taxol after 20 minutes.    13:15 - Symptoms resolved, per patient, no chest tightness, no nausea, feeling much better patient states.

## 2017-04-19 NOTE — Progress Notes (Signed)
Patient here today for follow up with treatment. She states that she is feeling well today and denies having any pain.

## 2017-04-19 NOTE — Telephone Encounter (Signed)
Made a total of 7 phone calls to [facility] and [facility] to get a prior authorization for the VistaSeq Hereditary Cancer Panel. On the 7th call, spoke to Harmon Pier at [facility] and was able to initiate the process for prior auth. Sent clinicals to the clinical review team @ [phone]. Waiting for approval; according to [facility] can take up to 14 days. Patient notified.   dhs

## 2017-04-19 NOTE — Progress Notes (Signed)
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 Patient is a 56 year old postmenopausal female who had a routine screening mammogram on 03/23/2017 which showed a highly suspicious palpable solid mass in the 10 o'clock position. Mass measures at least 5 cm in maximum diameter for right axillary lymph nodes have sonographic features suspicious for metastatic disease. No evidence of malignancy was noted in the left breast. This was followed by an ultrasound of the right breast including the right axilla which confirmed the same findings.Targeted ultrasound is performed, showing a markedly irregular and hypoechoic mass centered at 10 o'clock position approximately 7 cm from the nipple estimated to be at least 5.2 x 4.4 x 3.4 cm. Small areas of vascular flow are detected within the mass. At 10 o'clock position 9 cm from the nipple again noted is a simple cyst measuring approximately 2.4 cm greatest diameter. Skin thickness of the medial and lateral of right breast measures approximately 4 mm.  Ultrasound of the right axilla demonstrates 4 suspicious lymph nodes with hypoechoic thickened cortices. The largest of these lymph nodes measures 2.2 x 1.2 x 1.0 cm and has a cortical thickness of 4-5 mm.  Patient underwent core biopsy of the right breast mass.  2. 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  Right axillary lymph node biopsy was also consistent with metastatic carcinoma. ERnegativePR5% positive,and HER-2negative by FISH  3. Patient is G3P3L3. Menarche at the age of 56. Menopause at 35. No priro h/o breast biopsies. No family h/o breast cancer. She was known to have right breast cyst being followed by Dr. Bary Castilla. She noticed this amss sometime over the summer and feels this has grown since then. She is healthy and has no significant co-morbidities  4. PET/CT scan on 04/04/17 showed:IMPRESSION: 1. Hypermetabolic right breast mass with hypermetabolic lymph nodes in the right axilla and hypermetabolic metastatic disease in both hilar regions and subcarinal mediastinum. 2. No evidence for hypermetabolic metastatic disease in the neck, abdomen or pelvis  5. Patient underwent bronchoscopy and biopsy of hilar LN which was positive for metastatic carcinoma from the breast. She also underwent RUl biopsy and bronchial washings that were negative for malignancy    Interval history-patient was seen by Dr. Alinda Money at [facility] for a second opinion.  I am awaiting his final note at this time.  Patient tells me that he had suggested aggressive treatment at this time and consideration for possible surgery down the line.  She is tolerating her cycle 1 of chemotherapy well.  Reports sharp pain around her chest wall which lasted for a few hours on one day and then resolved  ECOG PS- 0 Pain scale- 0   Review of systems- Review of Systems  Constitutional: Negative for chills, fever, malaise/fatigue and weight loss.  HENT: Negative for congestion, ear discharge and nosebleeds.   Eyes: Negative for blurred vision.  Respiratory: Negative for cough, hemoptysis, sputum production, shortness of breath and wheezing.   Cardiovascular: Negative for chest pain, palpitations, orthopnea and claudication.  Gastrointestinal: Negative for abdominal pain, blood in stool, constipation, diarrhea, heartburn,  melena, nausea and vomiting.  Genitourinary: Negative for dysuria, flank pain, frequency, hematuria and urgency.  Musculoskeletal: Negative for back pain, joint pain and myalgias.  Skin: Negative for rash.  Neurological: Negative for dizziness, tingling, focal weakness, seizures, weakness and headaches.  Endo/Heme/Allergies: Does not bruise/bleed easily.  Psychiatric/Behavioral: Negative for depression and suicidal ideas. The patient does not have insomnia.        Allergies  Allergen Reactions  . Penicillins Rash     Past Medical History:  Diagnosis Date  . Anemia   . Breast cyst, right    aspirated by Dr. Bary Castilla  . Hyperlipidemia   . Pre-diabetes      Past Surgical History:  Procedure Laterality Date  . AXILLARY LYMPH NODE BIOPSY Right 04/09/2017   Procedure: AXILLARY LYMPH NODE BIOPSY;  Surgeon: Robert Bellow, MD;  Location: [facility] ORS;  Service: General;  Laterality: Right;  . BREAST CYST ASPIRATION Right 11/2009   Dr. Bary Castilla did FNA  . COLONOSCOPY  2011  . DILATION AND CURETTAGE OF UTERUS     X3  . ENDOBRONCHIAL ULTRASOUND N/A 04/09/2017   Procedure: ENDOBRONCHIAL ULTRASOUND;  Surgeon: Laverle Hobby, MD;  Location: [facility] ORS;  Service: Pulmonary;  Laterality: N/A;  . ENDOMETRIAL ABLATION    . ENDOMETRIAL BIOPSY  09/2009  . PORTACATH PLACEMENT Left 04/09/2017   Procedure: INSERTION PORT-A-CATH;  Surgeon: Robert Bellow, MD;  Location: [facility] ORS;  Service: General;  Laterality: Left;    Social History   Socioeconomic History  . Marital status: Married    Spouse name: Not on file  . Number of children: Not on file  . Years of education: Not on file  . Highest education level: Not on file  Social Needs  . Financial resource strain: Not on file  . Food insecurity - worry: Not on file  . Food insecurity - inability: Not on file  . Transportation needs - medical: Not on file  . Transportation needs - non-medical: Not on file  Occupational History  .  Not on file  Tobacco Use  . Smoking status: Never Smoker  . Smokeless tobacco: Never Used  Substance and Sexual Activity  . Alcohol use: Yes    Alcohol/week: 1.2 oz    Types: 2 Glasses of wine per week  . Drug use: No  . Sexual activity: Not Currently  Other Topics Concern  . Not on file  Social History Narrative  . Not on file    Family History  Problem Relation Age of Onset  . Melanoma Maternal Grandmother 80  . Brain cancer Maternal Grandfather 51  . Melanoma Other 90     Current Outpatient Medications:  .  Biotin 2500 MCG CAPS, Take 5,000 mcg/day by mouth daily., Disp: , Rfl:  .  Cholecalciferol (VITAMIN D3) 10000 units TABS, Take by mouth 1 day or 1 dose., Disp: , Rfl:  .  dexamethasone (DECADRON) 4 MG tablet, Take 2 tablets (8 mg total) by mouth daily. Start the day after chemotherapy for 2 days., Disp: 30 tablet, Rfl: 1 .  lidocaine-prilocaine (EMLA) cream, Apply to affected area once, Disp: 30 g, Rfl: 3 .  Multiple Vitamins-Minerals (EMERGEN-C IMMUNE PLUS PO), Take 1 packet by mouth 1 day or 1 dose. With energy, Disp: , Rfl:  .  omega-3 acid ethyl esters (LOVAZA) 1 g capsule, Take by mouth 1 day or 1 dose., Disp: , Rfl:  .  vitamin E 400  UNIT capsule, Take 400 Units by mouth daily., Disp: , Rfl:  .  HYDROcodone-acetaminophen (NORCO) 5-325 MG tablet, Take 1-2 tablets every 4 (four) hours as needed by mouth. Take no more than 10 tablets daily. (Patient not taking: Reported on 04/19/2017), Disp: 30 tablet, Rfl: 0 .  LORazepam (ATIVAN) 0.5 MG tablet, Take 1 tablet (0.5 mg total) by mouth every 6 (six) hours as needed (Nausea or vomiting). (Patient not taking: Reported on 04/12/2017), Disp: 30 tablet, Rfl: 0 .  ondansetron (ZOFRAN) 8 MG tablet, Take 1 tablet (8 mg total) by mouth 2 (two) times daily as needed for refractory nausea / vomiting. Start on day 3 after chemo. (Patient not taking: Reported on 04/12/2017), Disp: 30 tablet, Rfl: 1 .  prochlorperazine (COMPAZINE) 10 MG  tablet, Take 1 tablet (10 mg total) by mouth every 6 (six) hours as needed (Nausea or vomiting). (Patient not taking: Reported on 04/12/2017), Disp: 30 tablet, Rfl: 1 No current facility-administered medications for this visit.   Facility-Administered Medications Ordered in Other Visits:  .  CARBOplatin (PARAPLATIN) 260 mg in sodium chloride 0.9 % 250 mL chemo infusion, 260 mg, Intravenous, Once, Sindy Guadeloupe, MD .  dexamethasone (DECADRON) 20 mg in sodium chloride 0.9 % 50 mL IVPB, 20 mg, Intravenous, Once, Sindy Guadeloupe, MD, 20 mg at 04/19/17 1217 .  heparin lock flush 100 unit/mL, 500 Units, Intracatheter, Once PRN, Sindy Guadeloupe, MD .  PACLitaxel (TAXOL) 156 mg in dextrose 5 % 250 mL chemo infusion (</= 40m/m2), 80 mg/m2 (Treatment Plan Recorded), Intravenous, Once, RSindy Guadeloupe MD  Physical exam:  Vitals:   04/19/17 1024  BP: 122/80  Pulse: 94  Temp: 98.2 F (36.8 C)  TempSrc: Oral  Weight: 193 lb (87.5 kg)   Physical Exam  Constitutional: She is oriented to person, place, and time and well-developed, well-nourished, and in no distress.  HENT:  Head: Normocephalic and atraumatic.  Eyes: EOM are normal. Pupils are equal, round, and reactive to light.  Neck: Normal range of motion.  Cardiovascular: Normal rate, regular rhythm and normal heart sounds.  Pulmonary/Chest: Effort normal and breath sounds normal.  Abdominal: Soft. Bowel sounds are normal.  Neurological: She is alert and oriented to person, place, and time.  Skin: Skin is warm and dry.     CMP Latest Ref Rng & Units 03/28/2017  Glucose 65 - 99 mg/dL 102(H)  BUN 6 - 24 mg/dL 15  Creatinine 0.57 - 1.00 mg/dL 0.82  Sodium 134 - 144 mmol/L 140  Potassium 3.5 - 5.2 mmol/L 4.1  Chloride 96 - 106 mmol/L 106  CO2 20 - 29 mmol/L 22  Calcium 8.7 - 10.2 mg/dL 9.3  Total Protein 6.0 - 8.5 g/dL 6.9  Total Bilirubin 0.0 - 1.2 mg/dL 0.4  Alkaline Phos 39 - 117 IU/L 89  AST 0 - 40 IU/L 19  ALT 0 - 32 IU/L 22   CBC  Latest Ref Rng & Units 03/28/2017  WBC 3.4 - 10.8 x10E3/uL 4.3  Hemoglobin 11.1 - 15.9 g/dL 12.9  Hematocrit 34.0 - 46.6 % 38.2  Platelets 150 - 379 x10E3/uL 245    No images are attached to the encounter.  Nm Pet Image Initial (pi) Skull Base To Thigh  Result Date: 04/04/2017 CLINICAL DATA:  Initial treatment strategy for right-sided breast cancer. EXAM: NUCLEAR MEDICINE PET SKULL BASE TO THIGH TECHNIQUE: 12.7 mCi F-18 FDG was injected intravenously. Full-ring PET imaging was performed from the skull base to thigh after the radiotracer. CT  data was obtained and used for attenuation correction and anatomic localization. FASTING BLOOD GLUCOSE:  Value: 96 mg/dl COMPARISON:  None. FINDINGS: NECK: No hypermetabolic lymph nodes in the neck. CHEST: Irregular right breast mass is hypermetabolic with SUV max = 7.2. Hypermetabolic nodal disease in the right axilla identified with SUV max = 5.3. Although not well demonstrated on noncontrast CT imaging for attenuation correction, there is hypermetabolic lymphadenopathy in both hilar regions and in the subcarinal station. Index hypermetabolism in the right hilum demonstrates SUV max = 6.9. Compressive atelectasis noted in the lung bases on CT imaging. ABDOMEN/PELVIS: No abnormal hypermetabolic activity within the liver, pancreas, adrenal glands, or spleen. No hypermetabolic lymph nodes in the abdomen or pelvis. There is abdominal aortic atherosclerosis without aneurysm. Small umbilical and paraumbilical hernias contain only fat. SKELETON: No focal hypermetabolic activity to suggest skeletal metastasis. IMPRESSION: 1. Hypermetabolic right breast mass with hypermetabolic lymph nodes in the right axilla and hypermetabolic metastatic disease in both hilar regions and subcarinal mediastinum. 2. No evidence for hypermetabolic metastatic disease in the neck, abdomen or pelvis. Electronically Signed   By: Misty Stanley M.D.   On: 04/04/2017 11:22   Dg Chest Port 1  View  Result Date: 04/10/2017 CLINICAL DATA:  Port-A-Cath insertion EXAM: PORTABLE CHEST 1 VIEW COMPARISON:  Portable exam 1532 hours without priors for comparison FINDINGS: LEFT subclavian Port-A-Cath with tip projecting over SVC. Normal heart size, mediastinal contours, and pulmonary vascularity. Peribronchial thickening with diffuse accentuation of interstitial markings. Subsegmental atelectasis at RIGHT middle lobe. No definite acute infiltrate, pleural effusion or pneumothorax. Osseous structures unremarkable. IMPRESSION: No pneumothorax following LEFT subclavian Port-A-Cath insertion. Bronchitic changes with diffuse interstitial prominence of uncertain acuity ; this could represent chronic interstitial disease, mild pulmonary edema or atypical infection. Subsegmental atelectasis at the RIGHT middle lobe. Electronically Signed   By: Lavonia Dana M.D.   On: 04/10/2017 08:57   Dg C-arm 1-60 Min-no Report  Result Date: 04/09/2017 Fluoroscopy was utilized by the requesting physician.  No radiographic interpretation.   US Breast Complete Uni Right Inc Axilla  Result Date: 03/27/2017 Indications for biopsy of both the dominant mass in the right breast as well as the enlarged axillary lymph node were reviewed with the patient and she was amenable to proceed. Examination of the axilla showed at least 1 dominant lymph node measuring up to 2.16 cm in diameter. The area was infiltrated with 10 mL of 0.5% Xylocaine with 0.25% Marcaine with 1 200,000 of epinephrine. A 14-gauge spring-loaded core biopsy device was used and 3 core samples were obtained. A postbiopsy clip was placed. This was completed in a transverse orientation from posterior to anterior. No bleeding noted. The right breast mass is a spiculated hypoechoic mass measuring at least 3.69 x 3.74 x 7.2 cm. The patient received an additional 10 mL of 0.5% Xylocaine with 0.25% Marcaine with 1-200,000 epinephrine. Again using a 14-gauge core biopsy device,  and using a lateral to medial approach 4 core samples were obtained after skin incision with an 11-gauge blade through various locations in the lesion. A postbiopsy clip was placed. There was about 10 mL of bleeding that was controlled with direct pressure. Both skin defects were closed with benzoin and Steri-Strips. Telfa and Tegaderm dressings applied.   US Breast Ltd Uni Left Inc Axilla  Result Date: 03/23/2017 CLINICAL DATA:  56 year old patient presents for evaluation of an enlarging mass in the upper-outer quadrant of the right breast that she noticed began increasing in size this summer. She  has a known palpable simple benign cyst in the upper-outer quadrant of the right breast previously evaluated by mammogram and ultrasound. This cyst was last evaluated in August of 2016. EXAM: 2D DIGITAL DIAGNOSTIC BILATERAL MAMMOGRAM WITH CAD AND ADJUNCT TOMO ULTRASOUND BILATERAL BREAST COMPARISON:  Previous exam(s). ACR Breast Density Category b: There are scattered areas of fibroglandular density. FINDINGS: Metallic skin marker was placed over the palpable area of concern in the right breast. There is a dominant spiculated mass in the upper-outer quadrant of the right breast that is adjacent to but separate from the stable circumscribed previously documented cyst in this region of the right breast. The spiculated mass measures at least 5 cm maximum transverse diameter and craniocaudal span. Anterior to posterior, the mass measures approximately 4.5 cm. There is new mild skin thickening of the right breast. No suspicious microcalcifications on the right. Question cortical thickening of a right axillary lymph node. A small asymmetry in the upper-outer quadrant of the left breast was evaluated with additional spot compression and 90 degree lateral view and has appearances consistent with normal fibroglandular tissue. No mass, distortion, or suspicious microcalcification is identified in the left breast. Mammographic  images were processed with CAD. On physical exam, there is a mildly protuberant and very firm palpable mass in the upper-outer quadrant of the right breast centered at 10 o'clock position approximately 7 cm from the nipple. On physical exam the mass measures approximately 6-7 cm. There is evidence of slight skin thickening in the medial and lateral aspects of the right breast. The nipple appears normal. I do not palpate any discrete lymphadenopathy in the right axilla. Targeted ultrasound is performed, showing a markedly irregular and hypoechoic mass centered at 10 o'clock position approximately 7 cm from the nipple estimated to be at least 5.2 x 4.4 x 3.4 cm. Small areas of vascular flow are detected within the mass. At 10 o'clock position 9 cm from the nipple again noted is a simple cyst measuring approximately 2.4 cm greatest diameter. Skin thickness of the medial and lateral of right breast measures approximately 4 mm. Ultrasound of the right axilla demonstrates 4 suspicious lymph nodes with hypoechoic thickened cortices. The largest of these lymph nodes measures 2.2 x 1.2 x 1.0 cm and has a cortical thickness of 4-5 mm. IMPRESSION: 1. Highly suspicious palpable solid mass centered in the 10 o'clock position of the right breast 7 cm from the nipple. The size of the mass is likely best estimated by the mammogram, and measures at least 5 cm maximum diameter. There is diffuse mild skin thickening of the right breast. 2. Four right axillary lymph nodes have sonographic features suspicious for metastatic disease. 3. Chronic benign simple cyst in the upper-outer quadrant of the right breast. 4. No evidence of malignancy in the left breast. RECOMMENDATION: Ultrasound-guided biopsy is recommended of the suspicious palpable mass in the 10 o'clock position the right breast. Ultrasound-guided biopsy of 1 of the suspicious right axillary lymph nodes is recommended. I have discussed the findings and recommendations with the  patient. Results were also provided in writing at the conclusion of the visit. If applicable, a reminder letter will be sent to the patient regarding the next appointment. BI-RADS CATEGORY  5: Highly suggestive of malignancy. Electronically Signed   By: Curlene Dolphin M.D.   On: 03/23/2017 15:57   US Breast Ltd Uni Right Inc Axilla  Result Date: 03/23/2017 CLINICAL DATA:  56 year old patient presents for evaluation of an enlarging mass in the upper-outer quadrant  of the right breast that she noticed began increasing in size this summer. She has a known palpable simple benign cyst in the upper-outer quadrant of the right breast previously evaluated by mammogram and ultrasound. This cyst was last evaluated in August of 2016. EXAM: 2D DIGITAL DIAGNOSTIC BILATERAL MAMMOGRAM WITH CAD AND ADJUNCT TOMO ULTRASOUND BILATERAL BREAST COMPARISON:  Previous exam(s). ACR Breast Density Category b: There are scattered areas of fibroglandular density. FINDINGS: Metallic skin marker was placed over the palpable area of concern in the right breast. There is a dominant spiculated mass in the upper-outer quadrant of the right breast that is adjacent to but separate from the stable circumscribed previously documented cyst in this region of the right breast. The spiculated mass measures at least 5 cm maximum transverse diameter and craniocaudal span. Anterior to posterior, the mass measures approximately 4.5 cm. There is new mild skin thickening of the right breast. No suspicious microcalcifications on the right. Question cortical thickening of a right axillary lymph node. A small asymmetry in the upper-outer quadrant of the left breast was evaluated with additional spot compression and 90 degree lateral view and has appearances consistent with normal fibroglandular tissue. No mass, distortion, or suspicious microcalcification is identified in the left breast. Mammographic images were processed with CAD. On physical exam, there is a  mildly protuberant and very firm palpable mass in the upper-outer quadrant of the right breast centered at 10 o'clock position approximately 7 cm from the nipple. On physical exam the mass measures approximately 6-7 cm. There is evidence of slight skin thickening in the medial and lateral aspects of the right breast. The nipple appears normal. I do not palpate any discrete lymphadenopathy in the right axilla. Targeted ultrasound is performed, showing a markedly irregular and hypoechoic mass centered at 10 o'clock position approximately 7 cm from the nipple estimated to be at least 5.2 x 4.4 x 3.4 cm. Small areas of vascular flow are detected within the mass. At 10 o'clock position 9 cm from the nipple again noted is a simple cyst measuring approximately 2.4 cm greatest diameter. Skin thickness of the medial and lateral of right breast measures approximately 4 mm. Ultrasound of the right axilla demonstrates 4 suspicious lymph nodes with hypoechoic thickened cortices. The largest of these lymph nodes measures 2.2 x 1.2 x 1.0 cm and has a cortical thickness of 4-5 mm. IMPRESSION: 1. Highly suspicious palpable solid mass centered in the 10 o'clock position of the right breast 7 cm from the nipple. The size of the mass is likely best estimated by the mammogram, and measures at least 5 cm maximum diameter. There is diffuse mild skin thickening of the right breast. 2. Four right axillary lymph nodes have sonographic features suspicious for metastatic disease. 3. Chronic benign simple cyst in the upper-outer quadrant of the right breast. 4. No evidence of malignancy in the left breast. RECOMMENDATION: Ultrasound-guided biopsy is recommended of the suspicious palpable mass in the 10 o'clock position the right breast. Ultrasound-guided biopsy of 1 of the suspicious right axillary lymph nodes is recommended. I have discussed the findings and recommendations with the patient. Results were also provided in writing at the  conclusion of the visit. If applicable, a reminder letter will be sent to the patient regarding the next appointment. BI-RADS CATEGORY  5: Highly suggestive of malignancy. Electronically Signed   By: Curlene Dolphin M.D.   On: 03/23/2017 15:57   Mm Diag Breast Tomo Bilateral  Result Date: 03/23/2017 CLINICAL DATA:  56 year old  patient presents for evaluation of an enlarging mass in the upper-outer quadrant of the right breast that she noticed began increasing in size this summer. She has a known palpable simple benign cyst in the upper-outer quadrant of the right breast previously evaluated by mammogram and ultrasound. This cyst was last evaluated in August of 2016. EXAM: 2D DIGITAL DIAGNOSTIC BILATERAL MAMMOGRAM WITH CAD AND ADJUNCT TOMO ULTRASOUND BILATERAL BREAST COMPARISON:  Previous exam(s). ACR Breast Density Category b: There are scattered areas of fibroglandular density. FINDINGS: Metallic skin marker was placed over the palpable area of concern in the right breast. There is a dominant spiculated mass in the upper-outer quadrant of the right breast that is adjacent to but separate from the stable circumscribed previously documented cyst in this region of the right breast. The spiculated mass measures at least 5 cm maximum transverse diameter and craniocaudal span. Anterior to posterior, the mass measures approximately 4.5 cm. There is new mild skin thickening of the right breast. No suspicious microcalcifications on the right. Question cortical thickening of a right axillary lymph node. A small asymmetry in the upper-outer quadrant of the left breast was evaluated with additional spot compression and 90 degree lateral view and has appearances consistent with normal fibroglandular tissue. No mass, distortion, or suspicious microcalcification is identified in the left breast. Mammographic images were processed with CAD. On physical exam, there is a mildly protuberant and very firm palpable mass in the  upper-outer quadrant of the right breast centered at 10 o'clock position approximately 7 cm from the nipple. On physical exam the mass measures approximately 6-7 cm. There is evidence of slight skin thickening in the medial and lateral aspects of the right breast. The nipple appears normal. I do not palpate any discrete lymphadenopathy in the right axilla. Targeted ultrasound is performed, showing a markedly irregular and hypoechoic mass centered at 10 o'clock position approximately 7 cm from the nipple estimated to be at least 5.2 x 4.4 x 3.4 cm. Small areas of vascular flow are detected within the mass. At 10 o'clock position 9 cm from the nipple again noted is a simple cyst measuring approximately 2.4 cm greatest diameter. Skin thickness of the medial and lateral of right breast measures approximately 4 mm. Ultrasound of the right axilla demonstrates 4 suspicious lymph nodes with hypoechoic thickened cortices. The largest of these lymph nodes measures 2.2 x 1.2 x 1.0 cm and has a cortical thickness of 4-5 mm. IMPRESSION: 1. Highly suspicious palpable solid mass centered in the 10 o'clock position of the right breast 7 cm from the nipple. The size of the mass is likely best estimated by the mammogram, and measures at least 5 cm maximum diameter. There is diffuse mild skin thickening of the right breast. 2. Four right axillary lymph nodes have sonographic features suspicious for metastatic disease. 3. Chronic benign simple cyst in the upper-outer quadrant of the right breast. 4. No evidence of malignancy in the left breast. RECOMMENDATION: Ultrasound-guided biopsy is recommended of the suspicious palpable mass in the 10 o'clock position the right breast. Ultrasound-guided biopsy of 1 of the suspicious right axillary lymph nodes is recommended. I have discussed the findings and recommendations with the patient. Results were also provided in writing at the conclusion of the visit. If applicable, a reminder letter  will be sent to the patient regarding the next appointment. BI-RADS CATEGORY  5: Highly suggestive of malignancy. Electronically Signed   By: Curlene Dolphin M.D.   On: 03/23/2017 15:57  Assessment and plan- Patient is a 56 y.o. 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 She will proceed with cycle 3 on 04/25/17 (1 day sooner) due to thanksgiving. I will see her back in 2 weeks with cbc, cmp for cycle 3 of carbo/taxol  Foundation one testing has been sent out and pending  Still awaiting insurance auth for genetic testing  Strata testing ordered at [facility].  Appreciate recommendations from Dr. Alinda Money and I will be in touch with him as well. For now plan is to do 12 cycles of carbo/taxol weekly followed by repeat PET/CT  Prophylaxis for taxol induced neuropathy- patient will be using ice cubes in ziplock bags on her toes and fingers during chemotherapy   Visit Diagnosis 1. Malignant neoplasm of upper-outer quadrant of right breast in female, estrogen receptor negative ([place])   2. Encounter for antineoplastic chemotherapy      Dr. Randa Evens, MD, MPH [facility] Pager- [phone] 04/19/2017 12:22 PM

## 2017-04-23 NOTE — Anesthesia Postprocedure Evaluation (Signed)
Anesthesia Post Note  Patient: Ashley Molina  Procedure(s) Performed: INSERTION PORT-A-CATH (Left ) AXILLARY LYMPH NODE BIOPSY (Right ) ENDOBRONCHIAL ULTRASOUND (N/A )  Patient location during evaluation: PACU Anesthesia Type: General Level of consciousness: awake and alert Pain management: pain level controlled Vital Signs Assessment: post-procedure vital signs reviewed and stable Respiratory status: spontaneous breathing, nonlabored ventilation, respiratory function stable and patient connected to nasal cannula oxygen Cardiovascular status: blood pressure returned to baseline and stable Postop Assessment: no apparent nausea or vomiting Anesthetic complications: no     Last Vitals:  Vitals:   04/09/17 1552 04/09/17 1632  BP: (!) 108/51 116/67  Pulse: 73 72  Resp: 12   Temp: (!) 36.3 C   SpO2: 100% 97%    Last Pain:  Vitals:   04/09/17 1632  TempSrc:   PainSc: 2                  Molli Barrows

## 2017-04-24 ENCOUNTER — Other Ambulatory Visit: Payer: Self-pay | Admitting: *Deleted

## 2017-04-25 ENCOUNTER — Inpatient Hospital Stay: Payer: 59

## 2017-04-25 ENCOUNTER — Ambulatory Visit: Payer: 59 | Admitting: Oncology

## 2017-04-25 VITALS — BP 100/68 | HR 68 | Temp 97.2°F | Resp 20

## 2017-04-25 DIAGNOSIS — Z171 Estrogen receptor negative status [ER-]: Principal | ICD-10-CM

## 2017-04-25 DIAGNOSIS — C50411 Malignant neoplasm of upper-outer quadrant of right female breast: Secondary | ICD-10-CM | POA: Diagnosis not present

## 2017-04-25 MED ORDER — DIPHENHYDRAMINE HCL 50 MG/ML IJ SOLN
50.0000 mg | Freq: Once | INTRAMUSCULAR | Status: AC
  Administered 2017-04-25: 50 mg via INTRAVENOUS
  Filled 2017-04-25: qty 1

## 2017-04-25 MED ORDER — SODIUM CHLORIDE 0.9% FLUSH
10.0000 mL | INTRAVENOUS | Status: DC | PRN
  Filled 2017-04-25: qty 10

## 2017-04-25 MED ORDER — PACLITAXEL CHEMO INJECTION 300 MG/50ML
80.0000 mg/m2 | Freq: Once | INTRAVENOUS | Status: AC
  Administered 2017-04-25: 156 mg via INTRAVENOUS
  Filled 2017-04-25: qty 26

## 2017-04-25 MED ORDER — CARBOPLATIN CHEMO INJECTION 450 MG/45ML
260.0000 mg | Freq: Once | INTRAVENOUS | Status: AC
  Administered 2017-04-25: 260 mg via INTRAVENOUS
  Filled 2017-04-25: qty 26

## 2017-04-25 MED ORDER — HEPARIN SOD (PORK) LOCK FLUSH 100 UNIT/ML IV SOLN
500.0000 [IU] | Freq: Once | INTRAVENOUS | Status: AC
  Administered 2017-04-25: 500 [IU] via INTRAVENOUS

## 2017-04-25 MED ORDER — SODIUM CHLORIDE 0.9 % IV SOLN
Freq: Once | INTRAVENOUS | Status: AC
  Administered 2017-04-25: 12:00:00 via INTRAVENOUS
  Filled 2017-04-25: qty 1000

## 2017-04-25 MED ORDER — PALONOSETRON HCL INJECTION 0.25 MG/5ML
0.2500 mg | Freq: Once | INTRAVENOUS | Status: AC
  Administered 2017-04-25: 0.25 mg via INTRAVENOUS
  Filled 2017-04-25: qty 5

## 2017-04-25 MED ORDER — FAMOTIDINE IN NACL 20-0.9 MG/50ML-% IV SOLN
20.0000 mg | Freq: Once | INTRAVENOUS | Status: AC
  Administered 2017-04-25: 20 mg via INTRAVENOUS
  Filled 2017-04-25: qty 50

## 2017-04-25 MED ORDER — HEPARIN SOD (PORK) LOCK FLUSH 100 UNIT/ML IV SOLN
INTRAVENOUS | Status: AC
  Filled 2017-04-25: qty 5

## 2017-04-25 MED ORDER — SODIUM CHLORIDE 0.9 % IV SOLN
20.0000 mg | Freq: Once | INTRAVENOUS | Status: AC
  Administered 2017-04-25: 20 mg via INTRAVENOUS
  Filled 2017-04-25: qty 2

## 2017-05-01 LAB — CULTURE, FUNGUS WITHOUT SMEAR

## 2017-05-01 LAB — SURGICAL PATHOLOGY

## 2017-05-02 ENCOUNTER — Encounter: Payer: Self-pay | Admitting: Oncology

## 2017-05-03 ENCOUNTER — Inpatient Hospital Stay: Payer: 59

## 2017-05-03 ENCOUNTER — Encounter: Payer: Self-pay | Admitting: Oncology

## 2017-05-03 ENCOUNTER — Inpatient Hospital Stay: Payer: 59 | Admitting: Oncology

## 2017-05-03 VITALS — BP 112/74 | HR 68 | Temp 97.5°F | Resp 20

## 2017-05-03 VITALS — BP 112/74 | HR 68 | Temp 97.5°F | Resp 20 | Wt 194.0 lb

## 2017-05-03 DIAGNOSIS — T451X5A Adverse effect of antineoplastic and immunosuppressive drugs, initial encounter: Secondary | ICD-10-CM

## 2017-05-03 DIAGNOSIS — C50411 Malignant neoplasm of upper-outer quadrant of right female breast: Secondary | ICD-10-CM | POA: Diagnosis not present

## 2017-05-03 DIAGNOSIS — Z79899 Other long term (current) drug therapy: Secondary | ICD-10-CM | POA: Diagnosis not present

## 2017-05-03 DIAGNOSIS — D649 Anemia, unspecified: Secondary | ICD-10-CM

## 2017-05-03 DIAGNOSIS — Z171 Estrogen receptor negative status [ER-]: Secondary | ICD-10-CM

## 2017-05-03 DIAGNOSIS — E785 Hyperlipidemia, unspecified: Secondary | ICD-10-CM | POA: Diagnosis not present

## 2017-05-03 DIAGNOSIS — Z7689 Persons encountering health services in other specified circumstances: Secondary | ICD-10-CM | POA: Diagnosis not present

## 2017-05-03 DIAGNOSIS — D701 Agranulocytosis secondary to cancer chemotherapy: Secondary | ICD-10-CM | POA: Insufficient documentation

## 2017-05-03 DIAGNOSIS — C771 Secondary and unspecified malignant neoplasm of intrathoracic lymph nodes: Secondary | ICD-10-CM | POA: Diagnosis not present

## 2017-05-03 DIAGNOSIS — Z5111 Encounter for antineoplastic chemotherapy: Secondary | ICD-10-CM

## 2017-05-03 MED ORDER — DEXTROSE 5 % IV SOLN
80.0000 mg/m2 | Freq: Once | INTRAVENOUS | Status: AC
  Administered 2017-05-03: 156 mg via INTRAVENOUS
  Filled 2017-05-03: qty 26

## 2017-05-03 MED ORDER — PALONOSETRON HCL INJECTION 0.25 MG/5ML
0.2500 mg | Freq: Once | INTRAVENOUS | Status: AC
  Administered 2017-05-03: 0.25 mg via INTRAVENOUS
  Filled 2017-05-03: qty 5

## 2017-05-03 MED ORDER — SODIUM CHLORIDE 0.9 % IV SOLN
260.0000 mg | Freq: Once | INTRAVENOUS | Status: AC
  Administered 2017-05-03: 260 mg via INTRAVENOUS
  Filled 2017-05-03: qty 26

## 2017-05-03 MED ORDER — SODIUM CHLORIDE 0.9% FLUSH
10.0000 mL | INTRAVENOUS | Status: DC | PRN
  Administered 2017-05-03: 10 mL via INTRAVENOUS
  Filled 2017-05-03: qty 10

## 2017-05-03 MED ORDER — SODIUM CHLORIDE 0.9 % IV SOLN
20.0000 mg | Freq: Once | INTRAVENOUS | Status: AC
  Administered 2017-05-03: 20 mg via INTRAVENOUS
  Filled 2017-05-03 (×2): qty 2

## 2017-05-03 MED ORDER — FAMOTIDINE IN NACL 20-0.9 MG/50ML-% IV SOLN
20.0000 mg | Freq: Once | INTRAVENOUS | Status: AC
  Administered 2017-05-03: 20 mg via INTRAVENOUS
  Filled 2017-05-03: qty 50

## 2017-05-03 MED ORDER — DIPHENHYDRAMINE HCL 50 MG/ML IJ SOLN
50.0000 mg | Freq: Once | INTRAMUSCULAR | Status: AC
  Administered 2017-05-03: 50 mg via INTRAVENOUS
  Filled 2017-05-03: qty 1

## 2017-05-03 MED ORDER — HEPARIN SOD (PORK) LOCK FLUSH 100 UNIT/ML IV SOLN
500.0000 [IU] | Freq: Once | INTRAVENOUS | Status: AC
  Administered 2017-05-03: 500 [IU] via INTRAVENOUS
  Filled 2017-05-03: qty 5

## 2017-05-03 MED ORDER — SODIUM CHLORIDE 0.9 % IV SOLN
Freq: Once | INTRAVENOUS | Status: AC
  Administered 2017-05-03: 11:00:00 via INTRAVENOUS
  Filled 2017-05-03: qty 1000

## 2017-05-03 NOTE — Progress Notes (Signed)
Labs from [facility] ok.. Scr 0.69, ANC 1.1.Marland Kitchen Per md ok to treat.

## 2017-05-03 NOTE — Addendum Note (Signed)
Addended by: Randa Evens C on: 05/03/2017 04:15 PM   Modules accepted: Orders

## 2017-05-03 NOTE — Progress Notes (Signed)
Hematology/Oncology Consult note [facility]  [phone] [phone]  Patient Care Team: Patient, No Pcp Per as PCP - General (General Practice) Byrnett, Forest Gleason, MD (General Surgery) Gae Dry, MD as Referring Physician (Obstetrics and Gynecology)   Name of the patient: Ashley Molina  [id number]  1961-01-20   Date of visit: 05/03/17  Diagnosis- Stage IVinvasive mammary carcinoma of the right breastcT3cN1cM1ER negative, PR 5% positive and HER-2/neu negative  Chief complaint/ Reason for visit-on treatment assessment prior to cycle #4 of weekly carbotaxol  Heme/Onc history: 1. Patient is a 56 year old postmenopausal female who had a routine screening mammogram on 03/23/2017 which showed a highly suspicious palpable solid mass in the 10 o'clock position. Mass measures at least 5 cm in maximum diameter for right axillary lymph nodes have sonographic features suspicious for metastatic disease. No evidence of malignancy was noted in the left breast. This was followed by an ultrasound of the right breast including the right axilla which confirmed the same findings.Targeted ultrasound is performed, showing a markedly irregular and hypoechoic mass centered at 10 o'clock position approximately 7 cm from the nipple estimated to be at least 5.2 x 4.4 x 3.4 cm. Small areas of vascular flow are detected within the mass. At 10 o'clock position 9 cm from the nipple again noted is a simple cyst measuring approximately 2.4 cm greatest diameter. Skin thickness of the medial and lateral of right breast measures approximately 4 mm.  Ultrasound of the right axilla demonstrates 4 suspicious lymph nodes with hypoechoic thickened cortices. The largest of these lymph nodes measures 2.2 x 1.2 x 1.0 cm and has a cortical thickness of 4-5 mm.  Patient underwent core biopsy of the right breast mass.  2. Biopsy showed 15 mm invasive carcinoma,  grade 3. Right axillary lymph node biopsy was also consistent with metastatic carcinoma. ERnegativePR5% positive,and HER-2negative by FISH  3. Patient is G3P3L3. Menarche at the age of 77. Menopause at 65. No priro h/o breast biopsies. No family h/o breast cancer. She was known to have right breast cyst being followed by Dr. Bary Castilla. She noticed this amss sometime over the summer and feels this has grown since then. She is healthy and has no significant co-morbidities  4. PET/CT scan on 04/04/17 showed:IMPRESSION: 1. Hypermetabolic right breast mass with hypermetabolic lymph nodes in the right axilla and hypermetabolic metastatic disease in both hilar regions and subcarinal mediastinum. 2. No evidence for hypermetabolic metastatic disease in the neck, abdomen or pelvis  5. Patient underwent bronchoscopy and biopsy of hilar LN which was positive for metastatic carcinoma from the breast. She also underwent RUl biopsy and bronchial washings that were negative for malignancy    Interval history-she has tolerated chemotherapy well so far.  Reports no symptoms of peripheral neuropathy.  ECOG PS- 0 Pain scale- 0 Opioid associated constipation- no  Review of systems- Review of Systems  Constitutional: Negative for chills, fever, malaise/fatigue and weight loss.  HENT: Negative for congestion, ear discharge and nosebleeds.   Eyes: Negative for blurred vision.  Respiratory: Negative for cough, hemoptysis, sputum production, shortness of breath and wheezing.   Cardiovascular: Negative for chest pain, palpitations, orthopnea and claudication.  Gastrointestinal: Negative for abdominal pain, blood in stool, constipation, diarrhea, heartburn, melena, nausea and vomiting.  Genitourinary: Negative for dysuria, flank pain, frequency, hematuria and urgency.  Musculoskeletal: Negative for back pain, joint pain and myalgias.  Skin: Negative for rash.  Neurological: Negative for dizziness,  tingling, focal weakness, seizures,  weakness and headaches.  Endo/Heme/Allergies: Does not bruise/bleed easily.  Psychiatric/Behavioral: Negative for depression and suicidal ideas. The patient does not have insomnia.        Allergies  Allergen Reactions  . Penicillins Rash     Past Medical History:  Diagnosis Date  . Anemia   . Breast cyst, right    aspirated by Dr. Bary Castilla  . Hyperlipidemia   . Pre-diabetes      Past Surgical History:  Procedure Laterality Date  . AXILLARY LYMPH NODE BIOPSY Right 04/09/2017   Procedure: AXILLARY LYMPH NODE BIOPSY;  Surgeon: Robert Bellow, MD;  Location: [facility] ORS;  Service: General;  Laterality: Right;  . BREAST CYST ASPIRATION Right 11/2009   Dr. Bary Castilla did FNA  . COLONOSCOPY  2011  . DILATION AND CURETTAGE OF UTERUS     X3  . ENDOBRONCHIAL ULTRASOUND N/A 04/09/2017   Procedure: ENDOBRONCHIAL ULTRASOUND;  Surgeon: Laverle Hobby, MD;  Location: [facility] ORS;  Service: Pulmonary;  Laterality: N/A;  . ENDOMETRIAL ABLATION    . ENDOMETRIAL BIOPSY  09/2009  . PORTACATH PLACEMENT Left 04/09/2017   Procedure: INSERTION PORT-A-CATH;  Surgeon: Robert Bellow, MD;  Location: [facility] ORS;  Service: General;  Laterality: Left;    Social History   Socioeconomic History  . Marital status: Married    Spouse name: Not on file  . Number of children: Not on file  . Years of education: Not on file  . Highest education level: Not on file  Social Needs  . Financial resource strain: Not on file  . Food insecurity - worry: Not on file  . Food insecurity - inability: Not on file  . Transportation needs - medical: Not on file  . Transportation needs - non-medical: Not on file  Occupational History  . Not on file  Tobacco Use  . Smoking status: Never Smoker  . Smokeless tobacco: Never Used  Substance and Sexual Activity  . Alcohol use: Yes    Alcohol/week: 1.2 oz    Types: 2 Glasses of wine per week  . Drug use: No  . Sexual activity:  Not Currently  Other Topics Concern  . Not on file  Social History Narrative  . Not on file    Family History  Problem Relation Age of Onset  . Melanoma Maternal Grandmother 54  . Brain cancer Maternal Grandfather 30  . Melanoma Other 90     Current Outpatient Medications:  .  Biotin 2500 MCG CAPS, Take 5,000 mcg/day by mouth daily., Disp: , Rfl:  .  Cholecalciferol (VITAMIN D3) 10000 units TABS, Take by mouth 1 day or 1 dose., Disp: , Rfl:  .  dexamethasone (DECADRON) 4 MG tablet, Take 2 tablets (8 mg total) by mouth daily. Start the day after chemotherapy for 2 days., Disp: 30 tablet, Rfl: 1 .  HYDROcodone-acetaminophen (NORCO) 5-325 MG tablet, Take 1-2 tablets every 4 (four) hours as needed by mouth. Take no more than 10 tablets daily. (Patient not taking: Reported on 04/19/2017), Disp: 30 tablet, Rfl: 0 .  lidocaine-prilocaine (EMLA) cream, Apply to affected area once, Disp: 30 g, Rfl: 3 .  LORazepam (ATIVAN) 0.5 MG tablet, Take 1 tablet (0.5 mg total) by mouth every 6 (six) hours as needed (Nausea or vomiting). (Patient not taking: Reported on 04/12/2017), Disp: 30 tablet, Rfl: 0 .  Multiple Vitamins-Minerals (EMERGEN-C IMMUNE PLUS PO), Take 1 packet by mouth 1 day or 1 dose. With energy, Disp: , Rfl:  .  omega-3 acid ethyl esters (LOVAZA)  1 g capsule, Take by mouth 1 day or 1 dose., Disp: , Rfl:  .  ondansetron (ZOFRAN) 8 MG tablet, Take 1 tablet (8 mg total) by mouth 2 (two) times daily as needed for refractory nausea / vomiting. Start on day 3 after chemo. (Patient not taking: Reported on 04/12/2017), Disp: 30 tablet, Rfl: 1 .  prochlorperazine (COMPAZINE) 10 MG tablet, Take 1 tablet (10 mg total) by mouth every 6 (six) hours as needed (Nausea or vomiting). (Patient not taking: Reported on 04/12/2017), Disp: 30 tablet, Rfl: 1 .  vitamin E 400 UNIT capsule, Take 400 Units by mouth daily., Disp: , Rfl:   Physical exam:  Vitals:   05/03/17 1158  BP: 112/74  Pulse: 68  Resp: 20    Temp: (!) 97.5 F (36.4 C)  TempSrc: Tympanic  Weight: 194 lb (88 kg)   Physical Exam  Constitutional: She is oriented to person, place, and time and well-developed, well-nourished, and in no distress.  HENT:  Head: Normocephalic and atraumatic.  Eyes: EOM are normal. Pupils are equal, round, and reactive to light.  Neck: Normal range of motion.  Cardiovascular: Normal rate, regular rhythm and normal heart sounds.  Pulmonary/Chest: Effort normal and breath sounds normal.  Abdominal: Soft. Bowel sounds are normal.  Neurological: She is alert and oriented to person, place, and time.  Skin: Skin is warm and dry.  Right breast mass appears smaller in size and less hard to palpate.  No palpable right axillary adenopathy  CMP Latest Ref Rng & Units 03/28/2017  Glucose 65 - 99 mg/dL 102(H)  BUN 6 - 24 mg/dL 15  Creatinine 0.57 - 1.00 mg/dL 0.82  Sodium 134 - 144 mmol/L 140  Potassium 3.5 - 5.2 mmol/L 4.1  Chloride 96 - 106 mmol/L 106  CO2 20 - 29 mmol/L 22  Calcium 8.7 - 10.2 mg/dL 9.3  Total Protein 6.0 - 8.5 g/dL 6.9  Total Bilirubin 0.0 - 1.2 mg/dL 0.4  Alkaline Phos 39 - 117 IU/L 89  AST 0 - 40 IU/L 19  ALT 0 - 32 IU/L 22   CBC Latest Ref Rng & Units 03/28/2017  WBC 3.4 - 10.8 x10E3/uL 4.3  Hemoglobin 11.1 - 15.9 g/dL 12.9  Hematocrit 34.0 - 46.6 % 38.2  Platelets 150 - 379 x10E3/uL 245   Labs done at [facility] were as follows: CBC showed white count of 2.1 with an ANC of 1.1, H&H of 11.5/33.2 with an MCV of 78 and a platelet count of 203.  CMP was within normal limits and serum creatinine was 0.69.  ALT was mildly elevated at 53 No images are attached to the encounter.  Nm Pet Image Initial (pi) Skull Base To Thigh  Result Date: 04/04/2017 CLINICAL DATA:  Initial treatment strategy for right-sided breast cancer. EXAM: NUCLEAR MEDICINE PET SKULL BASE TO THIGH TECHNIQUE: 12.7 mCi F-18 FDG was injected intravenously. Full-ring PET imaging was performed from the skull base to  thigh after the radiotracer. CT data was obtained and used for attenuation correction and anatomic localization. FASTING BLOOD GLUCOSE:  Value: 96 mg/dl COMPARISON:  None. FINDINGS: NECK: No hypermetabolic lymph nodes in the neck. CHEST: Irregular right breast mass is hypermetabolic with SUV max = 7.2. Hypermetabolic nodal disease in the right axilla identified with SUV max = 5.3. Although not well demonstrated on noncontrast CT imaging for attenuation correction, there is hypermetabolic lymphadenopathy in both hilar regions and in the subcarinal station. Index hypermetabolism in the right hilum demonstrates SUV max = 6.9.  Compressive atelectasis noted in the lung bases on CT imaging. ABDOMEN/PELVIS: No abnormal hypermetabolic activity within the liver, pancreas, adrenal glands, or spleen. No hypermetabolic lymph nodes in the abdomen or pelvis. There is abdominal aortic atherosclerosis without aneurysm. Small umbilical and paraumbilical hernias contain only fat. SKELETON: No focal hypermetabolic activity to suggest skeletal metastasis. IMPRESSION: 1. Hypermetabolic right breast mass with hypermetabolic lymph nodes in the right axilla and hypermetabolic metastatic disease in both hilar regions and subcarinal mediastinum. 2. No evidence for hypermetabolic metastatic disease in the neck, abdomen or pelvis. Electronically Signed   By: Misty Stanley M.D.   On: 04/04/2017 11:22   Dg Chest Port 1 View  Result Date: 04/10/2017 CLINICAL DATA:  Port-A-Cath insertion EXAM: PORTABLE CHEST 1 VIEW COMPARISON:  Portable exam 1532 hours without priors for comparison FINDINGS: LEFT subclavian Port-A-Cath with tip projecting over SVC. Normal heart size, mediastinal contours, and pulmonary vascularity. Peribronchial thickening with diffuse accentuation of interstitial markings. Subsegmental atelectasis at RIGHT middle lobe. No definite acute infiltrate, pleural effusion or pneumothorax. Osseous structures unremarkable. IMPRESSION:  No pneumothorax following LEFT subclavian Port-A-Cath insertion. Bronchitic changes with diffuse interstitial prominence of uncertain acuity ; this could represent chronic interstitial disease, mild pulmonary edema or atypical infection. Subsegmental atelectasis at the RIGHT middle lobe. Electronically Signed   By: Lavonia Dana M.D.   On: 04/10/2017 08:57   Dg C-arm 1-60 Min-no Report  Result Date: 04/09/2017 Fluoroscopy was utilized by the requesting physician.  No radiographic interpretation.     Assessment and plan- Patient is a 56 y.o. 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  She will proceed with cycle #4 of chemotherapy today.  I will plan to give her 1 dose of Neupogen tomorrow and repeat CBC on 05/06/2017.  I may have to dose reduce her Taxol and or eliminate her carboplatin next week based on her counts  she will proceed to cycle #5 of carbo Taxol next week and I will see her back in 2 weeks time prior to cycle #6 of carbotaxol.  She will get CBC CMP each week.  Baseline CA-27-29 was not elevated.  We will therefore check CA 15 3 today  She has evidence of microcytosis with mild anemia.  I will check her ferritin and iron studies B12 and folate today     Visit Diagnosis 1. Encounter for antineoplastic chemotherapy   2. Malignant neoplasm of upper-outer quadrant of right breast in female, estrogen receptor negative ([place])      Dr. Randa Evens, MD, MPH [facility] at [facility] Pager- [phone] 05/03/2017 11:49 AM

## 2017-05-03 NOTE — Progress Notes (Signed)
Patient here for follow up with lab results [facility]) and treatment today. She states that she is feeling well and denies having any pain. She states that she has been very hyper for the last week and has not required much sleep. She is asking whether or not to start taking vitamin B and iron supplements, as she has a friend that recommended them.

## 2017-05-04 ENCOUNTER — Inpatient Hospital Stay: Payer: 59

## 2017-05-04 DIAGNOSIS — D701 Agranulocytosis secondary to cancer chemotherapy: Secondary | ICD-10-CM

## 2017-05-04 DIAGNOSIS — T451X5A Adverse effect of antineoplastic and immunosuppressive drugs, initial encounter: Principal | ICD-10-CM

## 2017-05-04 DIAGNOSIS — C50411 Malignant neoplasm of upper-outer quadrant of right female breast: Secondary | ICD-10-CM | POA: Diagnosis not present

## 2017-05-04 MED ORDER — TBO-FILGRASTIM 300 MCG/0.5ML ~~LOC~~ SOSY
300.0000 ug | PREFILLED_SYRINGE | Freq: Every day | SUBCUTANEOUS | Status: DC
  Administered 2017-05-04: 300 ug via SUBCUTANEOUS

## 2017-05-07 ENCOUNTER — Inpatient Hospital Stay: Payer: 59 | Attending: Oncology

## 2017-05-07 ENCOUNTER — Encounter: Payer: Self-pay | Admitting: Oncology

## 2017-05-07 DIAGNOSIS — Z79899 Other long term (current) drug therapy: Secondary | ICD-10-CM | POA: Insufficient documentation

## 2017-05-07 DIAGNOSIS — Z7689 Persons encountering health services in other specified circumstances: Secondary | ICD-10-CM | POA: Insufficient documentation

## 2017-05-07 DIAGNOSIS — C50411 Malignant neoplasm of upper-outer quadrant of right female breast: Secondary | ICD-10-CM | POA: Insufficient documentation

## 2017-05-07 DIAGNOSIS — Z171 Estrogen receptor negative status [ER-]: Secondary | ICD-10-CM | POA: Insufficient documentation

## 2017-05-07 DIAGNOSIS — E785 Hyperlipidemia, unspecified: Secondary | ICD-10-CM | POA: Insufficient documentation

## 2017-05-07 DIAGNOSIS — Z5111 Encounter for antineoplastic chemotherapy: Secondary | ICD-10-CM | POA: Insufficient documentation

## 2017-05-09 ENCOUNTER — Telehealth: Payer: Self-pay | Admitting: *Deleted

## 2017-05-09 NOTE — Telephone Encounter (Signed)
Called pt and left message that the tests that Dr. Alinda Money was not a genetic testing.  Dr. Janese Banks would like her to have the genetic panel that covers several different cancers but her insurance only covers BRCA 1 and BRCA 2. If she could call me let me know.

## 2017-05-09 NOTE — Telephone Encounter (Signed)
-----   Message from Sindy Guadeloupe, MD sent at 05/04/2017  8:14 AM EST ----- Patient needs vistacheck testing. Strata testing is not genetic testing. It is like foundation one tumor testing

## 2017-05-10 ENCOUNTER — Other Ambulatory Visit: Payer: Self-pay | Admitting: Oncology

## 2017-05-10 ENCOUNTER — Encounter: Payer: Self-pay | Admitting: Oncology

## 2017-05-10 ENCOUNTER — Inpatient Hospital Stay: Payer: 59

## 2017-05-10 DIAGNOSIS — T451X5A Adverse effect of antineoplastic and immunosuppressive drugs, initial encounter: Principal | ICD-10-CM

## 2017-05-10 DIAGNOSIS — C50411 Malignant neoplasm of upper-outer quadrant of right female breast: Secondary | ICD-10-CM | POA: Diagnosis present

## 2017-05-10 DIAGNOSIS — Z5111 Encounter for antineoplastic chemotherapy: Secondary | ICD-10-CM | POA: Diagnosis not present

## 2017-05-10 DIAGNOSIS — Z171 Estrogen receptor negative status [ER-]: Secondary | ICD-10-CM | POA: Diagnosis not present

## 2017-05-10 DIAGNOSIS — Z7689 Persons encountering health services in other specified circumstances: Secondary | ICD-10-CM | POA: Diagnosis not present

## 2017-05-10 DIAGNOSIS — E785 Hyperlipidemia, unspecified: Secondary | ICD-10-CM | POA: Diagnosis not present

## 2017-05-10 DIAGNOSIS — D701 Agranulocytosis secondary to cancer chemotherapy: Secondary | ICD-10-CM

## 2017-05-10 DIAGNOSIS — Z79899 Other long term (current) drug therapy: Secondary | ICD-10-CM | POA: Diagnosis not present

## 2017-05-10 MED ORDER — TBO-FILGRASTIM 480 MCG/0.8ML ~~LOC~~ SOSY
480.0000 ug | PREFILLED_SYRINGE | Freq: Every day | SUBCUTANEOUS | Status: DC
  Administered 2017-05-10: 480 ug via SUBCUTANEOUS
  Filled 2017-05-10: qty 0.8

## 2017-05-10 NOTE — Progress Notes (Signed)
No tx today per Dr Janese Banks, pt informed.

## 2017-05-11 ENCOUNTER — Inpatient Hospital Stay: Payer: 59

## 2017-05-11 DIAGNOSIS — T451X5A Adverse effect of antineoplastic and immunosuppressive drugs, initial encounter: Principal | ICD-10-CM

## 2017-05-11 DIAGNOSIS — D701 Agranulocytosis secondary to cancer chemotherapy: Secondary | ICD-10-CM

## 2017-05-11 DIAGNOSIS — C50411 Malignant neoplasm of upper-outer quadrant of right female breast: Secondary | ICD-10-CM | POA: Diagnosis not present

## 2017-05-11 MED ORDER — TBO-FILGRASTIM 480 MCG/0.8ML ~~LOC~~ SOSY: 480.0000 ug | PREFILLED_SYRINGE | Freq: Every day | SUBCUTANEOUS | Status: DC

## 2017-05-11 MED ORDER — TBO-FILGRASTIM 480 MCG/0.8ML ~~LOC~~ SOSY
480.0000 ug | PREFILLED_SYRINGE | Freq: Every day | SUBCUTANEOUS | Status: AC
  Administered 2017-05-11: 480 ug via SUBCUTANEOUS

## 2017-05-15 ENCOUNTER — Encounter: Payer: Self-pay | Admitting: Oncology

## 2017-05-17 ENCOUNTER — Inpatient Hospital Stay (HOSPITAL_BASED_OUTPATIENT_CLINIC_OR_DEPARTMENT_OTHER): Payer: 59 | Admitting: Oncology

## 2017-05-17 ENCOUNTER — Inpatient Hospital Stay: Payer: 59

## 2017-05-17 ENCOUNTER — Encounter: Payer: Self-pay | Admitting: Oncology

## 2017-05-17 VITALS — BP 110/71 | HR 74 | Temp 97.6°F | Resp 16 | Wt 199.0 lb

## 2017-05-17 DIAGNOSIS — Z171 Estrogen receptor negative status [ER-]: Secondary | ICD-10-CM

## 2017-05-17 DIAGNOSIS — C50411 Malignant neoplasm of upper-outer quadrant of right female breast: Secondary | ICD-10-CM

## 2017-05-17 DIAGNOSIS — Z7689 Persons encountering health services in other specified circumstances: Secondary | ICD-10-CM

## 2017-05-17 DIAGNOSIS — E785 Hyperlipidemia, unspecified: Secondary | ICD-10-CM | POA: Diagnosis not present

## 2017-05-17 DIAGNOSIS — Z5111 Encounter for antineoplastic chemotherapy: Secondary | ICD-10-CM

## 2017-05-17 DIAGNOSIS — Z79899 Other long term (current) drug therapy: Secondary | ICD-10-CM

## 2017-05-17 MED ORDER — SODIUM CHLORIDE 0.9 % IV SOLN
220.0000 mg | Freq: Once | INTRAVENOUS | Status: AC
  Administered 2017-05-17: 220 mg via INTRAVENOUS
  Filled 2017-05-17: qty 22

## 2017-05-17 MED ORDER — FAMOTIDINE IN NACL 20-0.9 MG/50ML-% IV SOLN
20.0000 mg | Freq: Once | INTRAVENOUS | Status: AC
  Administered 2017-05-17: 20 mg via INTRAVENOUS
  Filled 2017-05-17: qty 50

## 2017-05-17 MED ORDER — PALONOSETRON HCL INJECTION 0.25 MG/5ML
0.2500 mg | Freq: Once | INTRAVENOUS | Status: AC
  Administered 2017-05-17: 0.25 mg via INTRAVENOUS
  Filled 2017-05-17: qty 5

## 2017-05-17 MED ORDER — SODIUM CHLORIDE 0.9 % IV SOLN
Freq: Once | INTRAVENOUS | Status: AC
  Administered 2017-05-17: 10:00:00 via INTRAVENOUS
  Filled 2017-05-17: qty 1000

## 2017-05-17 MED ORDER — SODIUM CHLORIDE 0.9 % IV SOLN
20.0000 mg | Freq: Once | INTRAVENOUS | Status: AC
  Administered 2017-05-17: 20 mg via INTRAVENOUS
  Filled 2017-05-17: qty 2

## 2017-05-17 MED ORDER — DIPHENHYDRAMINE HCL 50 MG/ML IJ SOLN
50.0000 mg | Freq: Once | INTRAMUSCULAR | Status: AC
  Administered 2017-05-17: 50 mg via INTRAVENOUS
  Filled 2017-05-17: qty 1

## 2017-05-17 MED ORDER — HEPARIN SOD (PORK) LOCK FLUSH 100 UNIT/ML IV SOLN
500.0000 [IU] | Freq: Once | INTRAVENOUS | Status: AC | PRN
  Administered 2017-05-17: 500 [IU]
  Filled 2017-05-17: qty 5

## 2017-05-17 MED ORDER — SODIUM CHLORIDE 0.9% FLUSH
10.0000 mL | INTRAVENOUS | Status: DC | PRN
  Administered 2017-05-17: 10 mL
  Filled 2017-05-17: qty 10

## 2017-05-17 MED ORDER — SODIUM CHLORIDE 0.9 % IV SOLN
80.0000 mg/m2 | Freq: Once | INTRAVENOUS | Status: AC
  Administered 2017-05-17: 156 mg via INTRAVENOUS
  Filled 2017-05-17: qty 26

## 2017-05-17 NOTE — Progress Notes (Signed)
Ok to proceed with tx today though ANC are not WNL per Judeen Hammans, Therapist, sports.

## 2017-05-17 NOTE — Progress Notes (Signed)
Patient here for follow up with chemo today. She had her labs done at [place] yesterday. She states that she is feeling well and denies having any pain. She has not had to use any pain or nausea medication.

## 2017-05-17 NOTE — Progress Notes (Signed)
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 Patient is a 55 year old postmenopausal female who had a routine screening mammogram on 03/23/2017 which showed a highly suspicious palpable solid mass in the 10 o'clock position. Mass measures at least 5 cm in maximum diameter for right axillary lymph nodes have sonographic features suspicious for metastatic disease. No evidence of malignancy was noted in the left breast. This was followed by an ultrasound of the right breast including the right axilla which confirmed the same findings.Targeted ultrasound is performed, showing a markedly irregular and hypoechoic mass centered at 10 o'clock position approximately 7 cm from the nipple estimated to be at least 5.2 x 4.4 x 3.4 cm. Small areas of vascular flow are detected within the mass. At 10 o'clock position 9 cm from the nipple again noted is a simple cyst measuring approximately 2.4 cm greatest diameter. Skin thickness of the medial and lateral of right breast measures approximately 4 mm.  Ultrasound of the right axilla demonstrates 4 suspicious lymph nodes with hypoechoic thickened cortices. The largest of these lymph nodes measures 2.2 x 1.2 x 1.0 cm and has a cortical thickness of 4-5 mm.  Patient underwent core biopsy of the right breast mass.  2. Biopsy showed 15 mm invasive  carcinoma, grade 3. Right axillary lymph node biopsy was also consistent with metastatic carcinoma. ERnegativePR5% positive,and HER-2negative by FISH  3. Patient is G3P3L3. Menarche at the age of 67. Menopause at 50. No priro h/o breast biopsies. No family h/o breast cancer. She was known to have right breast cyst being followed by Dr. Bary Castilla. She noticed this amss sometime over the summer and feels this has grown since then. She is healthy and has no significant co-morbidities  4. PET/CT scan on 04/04/17 showed:IMPRESSION: 1. Hypermetabolic right breast mass with hypermetabolic lymph nodes in the right axilla and hypermetabolic metastatic disease in both hilar regions and subcarinal mediastinum. 2. No evidence for hypermetabolic metastatic disease in the neck, abdomen or pelvis  5. Patient underwent bronchoscopy and biopsy of hilar LN which was positive for metastatic carcinoma from the breast. She also underwent RUl biopsy and bronchial washings that were negative for malignancy.  Baseline CA-15-3 normal at 16.9  And CA-27-29 normal at 15.8.   Interval history-she reports doing well.  Denies any fatigue nausea vomiting or tingling numbness in her extremities  ECOG PS- 0 Pain scale- 0   Review of systems- Review of Systems  Constitutional: Negative for chills, fever, malaise/fatigue and weight loss.  HENT: Negative for congestion, ear discharge and nosebleeds.   Eyes: Negative for blurred vision.  Respiratory: Negative for cough, hemoptysis, sputum production, shortness of breath and wheezing.   Cardiovascular: Negative for chest pain, palpitations, orthopnea and claudication.  Gastrointestinal: Negative for abdominal pain, blood in stool, constipation, diarrhea, heartburn, melena, nausea and vomiting.  Genitourinary: Negative for dysuria, flank pain, frequency, hematuria and urgency.  Musculoskeletal: Negative for back pain, joint pain and myalgias.  Skin: Negative for rash.  Neurological: Negative for dizziness, tingling, focal weakness, seizures, weakness and headaches.  Endo/Heme/Allergies: Does not bruise/bleed easily.  Psychiatric/Behavioral: Negative for depression and suicidal ideas. The patient does not have insomnia.       Allergies  Allergen Reactions  . Penicillins Rash     Past Medical History:  Diagnosis Date  . Anemia   . Breast cancer ([place])   . Breast cyst, right    aspirated by Dr. Bary Castilla  . Hyperlipidemia   . Pre-diabetes      Past Surgical History:  Procedure Laterality Date  . AXILLARY LYMPH NODE BIOPSY Right 04/09/2017   Procedure: AXILLARY LYMPH NODE BIOPSY;  Surgeon: Robert Bellow, MD;  Location: [facility] ORS;  Service: General;  Laterality: Right;  . BREAST CYST ASPIRATION Right 11/2009   Dr. Bary Castilla did FNA  . COLONOSCOPY  2011  . DILATION AND CURETTAGE OF UTERUS     X3  . ENDOBRONCHIAL ULTRASOUND N/A 04/09/2017   Procedure: ENDOBRONCHIAL ULTRASOUND;  Surgeon: Laverle Hobby, MD;  Location: [facility] ORS;  Service: Pulmonary;  Laterality: N/A;  . ENDOMETRIAL ABLATION    . ENDOMETRIAL BIOPSY  09/2009  . PORTACATH PLACEMENT Left 04/09/2017   Procedure: INSERTION PORT-A-CATH;  Surgeon: Robert Bellow, MD;  Location: [facility] ORS;  Service: General;  Laterality: Left;    Social History   Socioeconomic History  . Marital status: Married    Spouse name: Not on file  . Number of children: Not on file  . Years of education: Not on file  . Highest education level: Not on file  Social Needs  . Financial resource strain: Not on file  . Food insecurity - worry: Not on file  . Food insecurity - inability: Not on file  . Transportation needs - medical: Not on file  . Transportation needs - non-medical: Not on file  Occupational History  . Not on file  Tobacco Use  . Smoking status: Never Smoker  . Smokeless tobacco: Never Used  Substance and Sexual Activity  . Alcohol use: Yes    Alcohol/week: 1.2 oz    Types: 2  Glasses of wine per week  . Drug use: No  . Sexual activity: Not Currently  Other Topics Concern  . Not on file  Social History Narrative  . Not on file    Family History  Problem Relation Age of Onset  . Melanoma Maternal Grandmother 2  . Brain cancer Maternal Grandfather 73  . Melanoma Other 90     Current Outpatient Medications:  .  Biotin 2500 MCG CAPS, Take 5,000 mcg/day by mouth daily., Disp: , Rfl:  .  Cholecalciferol (VITAMIN D3) 10000 units TABS, Take by mouth 1 day or 1 dose., Disp: , Rfl:  .  dexamethasone (DECADRON) 4 MG tablet, Take 2 tablets (8 mg total) by mouth daily. Start the day after chemotherapy for 2 days., Disp: 30 tablet, Rfl: 1 .  HYDROcodone-acetaminophen (NORCO) 5-325 MG tablet, Take 1-2 tablets every 4 (four) hours as needed by mouth. Take no more than 10 tablets daily. (Patient not taking: Reported on 04/19/2017), Disp: 30 tablet, Rfl: 0 .  lidocaine-prilocaine (EMLA) cream, Apply to affected area once, Disp: 30 g, Rfl: 3 .  LORazepam (ATIVAN) 0.5 MG tablet, Take 1 tablet (0.5 mg total) by mouth every 6 (six) hours as needed (Nausea or vomiting). (Patient not taking: Reported on 04/12/2017), Disp: 30 tablet, Rfl: 0 .  Multiple Vitamins-Minerals (EMERGEN-C IMMUNE PLUS PO), Take 1 packet by mouth 1 day or 1 dose.  With energy, Disp: , Rfl:  .  omega-3 acid ethyl esters (LOVAZA) 1 g capsule, Take by mouth 1 day or 1 dose., Disp: , Rfl:  .  ondansetron (ZOFRAN) 8 MG tablet, Take 1 tablet (8 mg total) by mouth 2 (two) times daily as needed for refractory nausea / vomiting. Start on day 3 after chemo. (Patient not taking: Reported on 04/12/2017), Disp: 30 tablet, Rfl: 1 .  prochlorperazine (COMPAZINE) 10 MG tablet, Take 1 tablet (10 mg total) by mouth every 6 (six) hours as needed (Nausea or vomiting). (Patient not taking: Reported on 04/12/2017), Disp: 30 tablet, Rfl: 1 .  vitamin E 400 UNIT capsule, Take 400 Units by mouth daily., Disp: , Rfl:   Physical exam:    Vitals:   05/17/17 0918  BP: 110/71  Pulse: 74  Resp: 16  Temp: 97.6 F (36.4 C)  TempSrc: Tympanic  Weight: 199 lb (90.3 kg)   Physical Exam  Constitutional: She is oriented to person, place, and time and well-developed, well-nourished, and in no distress.  HENT:  Head: Normocephalic and atraumatic.  Eyes: EOM are normal. Pupils are equal, round, and reactive to light.  Neck: Normal range of motion.  Cardiovascular: Normal rate, regular rhythm and normal heart sounds.  Pulmonary/Chest: Effort normal and breath sounds normal.  Abdominal: Soft. Bowel sounds are normal.  Neurological: She is alert and oriented to person, place, and time.  Skin: Skin is warm and dry.  Right breast mass measures about 4 cm in size.  It is not as firm as before.  No palpable right axillary adenopathy  CMP Latest Ref Rng & Units 03/28/2017  Glucose 65 - 99 mg/dL 102(H)  BUN 6 - 24 mg/dL 15  Creatinine 0.57 - 1.00 mg/dL 0.82  Sodium 134 - 144 mmol/L 140  Potassium 3.5 - 5.2 mmol/L 4.1  Chloride 96 - 106 mmol/L 106  CO2 20 - 29 mmol/L 22  Calcium 8.7 - 10.2 mg/dL 9.3  Total Protein 6.0 - 8.5 g/dL 6.9  Total Bilirubin 0.0 - 1.2 mg/dL 0.4  Alkaline Phos 39 - 117 IU/L 89  AST 0 - 40 IU/L 19  ALT 0 - 32 IU/L 22   CBC Latest Ref Rng & Units 03/28/2017  WBC 3.4 - 10.8 x10E3/uL 4.3  Hemoglobin 11.1 - 15.9 g/dL 12.9  Hematocrit 34.0 - 46.6 % 38.2  Platelets 150 - 379 x10E3/uL 245    No images are attached to the encounter.  No results found.   Assessment and plan- Patient is a 56 y.o. female  withStage IV triple negative invasive breast carcinoma of the right breast T3N1M1 with metastases to hilum and subcarinal nodes here for on treatment assessment prior to cycle # 5 of weekly carbotaxol  Blood [facility] on 05/16/2017 shows white count of 2.9 with an ANC of 1.3, H&H of 11.5/35.8 and a platelet count of 134.  BUN and creatinine was 14/0.76 and LFTs were within normal limits except for a mildly  elevated ALT of 41.  Given that her Arena is greater than 1 I will proceed with cycle #5 of carbo Taxol today.  I will be dose reducing her carboplatin to AUC 1.5 starting the cycle.  She will get 1 dose of Neupogen on 1214 and 1215 and we will repeat her CBC on 1217.  Based on that we will decide if she needs another dose of Neupogen on 05/21/2017 as well.    Patient will directly proceed to cycle #6 of chemotherapy on 05/24/2017  with Neupogen on 05/25/2017 and 05/26/17  I will see her back in 2 weeks time for cycle #7 of weekly carbotaxol to be given on 05/31/2017.  She will also get her CMP checked at [facility] prior    Visit Diagnosis 1. Malignant neoplasm of upper-outer quadrant of right breast in female, estrogen receptor negative ([place])   2. Encounter for antineoplastic chemotherapy      Dr. Randa Evens, MD, MPH [facility] Pager- [phone] 05/17/2017 9:38 AM

## 2017-05-18 ENCOUNTER — Inpatient Hospital Stay: Payer: 59

## 2017-05-18 ENCOUNTER — Other Ambulatory Visit: Payer: Self-pay | Admitting: *Deleted

## 2017-05-18 DIAGNOSIS — D701 Agranulocytosis secondary to cancer chemotherapy: Secondary | ICD-10-CM

## 2017-05-18 DIAGNOSIS — C50411 Malignant neoplasm of upper-outer quadrant of right female breast: Secondary | ICD-10-CM | POA: Diagnosis not present

## 2017-05-18 DIAGNOSIS — T451X5A Adverse effect of antineoplastic and immunosuppressive drugs, initial encounter: Principal | ICD-10-CM

## 2017-05-18 MED ORDER — TBO-FILGRASTIM 480 MCG/0.8ML ~~LOC~~ SOSY
480.0000 ug | PREFILLED_SYRINGE | Freq: Every day | SUBCUTANEOUS | Status: DC
  Administered 2017-05-18: 480 ug via SUBCUTANEOUS

## 2017-05-19 ENCOUNTER — Ambulatory Visit: Payer: 59

## 2017-05-19 ENCOUNTER — Other Ambulatory Visit: Payer: Self-pay | Admitting: *Deleted

## 2017-05-19 VITALS — BP 105/70 | HR 81 | Temp 97.9°F | Resp 18

## 2017-05-19 DIAGNOSIS — C50411 Malignant neoplasm of upper-outer quadrant of right female breast: Secondary | ICD-10-CM | POA: Diagnosis not present

## 2017-05-19 DIAGNOSIS — D702 Other drug-induced agranulocytosis: Secondary | ICD-10-CM

## 2017-05-19 DIAGNOSIS — Z171 Estrogen receptor negative status [ER-]: Principal | ICD-10-CM

## 2017-05-19 MED ORDER — TBO-FILGRASTIM 480 MCG/0.8ML ~~LOC~~ SOSY
480.0000 ug | PREFILLED_SYRINGE | Freq: Every day | SUBCUTANEOUS | Status: DC
  Administered 2017-05-19: 480 ug via SUBCUTANEOUS

## 2017-05-21 ENCOUNTER — Telehealth: Payer: Self-pay | Admitting: *Deleted

## 2017-05-21 ENCOUNTER — Encounter: Payer: Self-pay | Admitting: Oncology

## 2017-05-21 ENCOUNTER — Inpatient Hospital Stay: Payer: 59

## 2017-05-21 NOTE — Telephone Encounter (Signed)
Called pt to let her know that today's labs are wbc-6.9, and anc 5.6. She does not need an injection today and she does not need additional blood work this week and she just has appt for chemo later this week and she understands that I will make copy of labs and she will come Thursday for treatment

## 2017-05-22 ENCOUNTER — Encounter: Payer: Self-pay | Admitting: Oncology

## 2017-05-22 ENCOUNTER — Other Ambulatory Visit: Payer: Self-pay | Admitting: Oncology

## 2017-05-22 NOTE — Telephone Encounter (Signed)
Erroneous encounter

## 2017-05-23 LAB — ACID FAST CULTURE WITH REFLEXED SENSITIVITIES: ACID FAST CULTURE - AFSCU3: NEGATIVE

## 2017-05-24 ENCOUNTER — Inpatient Hospital Stay: Payer: 59

## 2017-05-24 ENCOUNTER — Encounter: Payer: Self-pay | Admitting: Oncology

## 2017-05-24 VITALS — BP 114/79 | HR 82 | Temp 97.9°F | Resp 20 | Wt 196.0 lb

## 2017-05-24 DIAGNOSIS — C50411 Malignant neoplasm of upper-outer quadrant of right female breast: Secondary | ICD-10-CM

## 2017-05-24 DIAGNOSIS — Z171 Estrogen receptor negative status [ER-]: Principal | ICD-10-CM

## 2017-05-24 MED ORDER — SODIUM CHLORIDE 0.9% FLUSH
10.0000 mL | INTRAVENOUS | Status: DC | PRN
  Filled 2017-05-24: qty 10

## 2017-05-24 MED ORDER — SODIUM CHLORIDE 0.9 % IV SOLN
Freq: Once | INTRAVENOUS | Status: AC
  Administered 2017-05-24: 10:00:00 via INTRAVENOUS
  Filled 2017-05-24: qty 1000

## 2017-05-24 MED ORDER — SODIUM CHLORIDE 0.9 % IV SOLN
80.0000 mg/m2 | Freq: Once | INTRAVENOUS | Status: AC
  Administered 2017-05-24: 156 mg via INTRAVENOUS
  Filled 2017-05-24: qty 26

## 2017-05-24 MED ORDER — CARBOPLATIN CHEMO INJECTION 450 MG/45ML
219.7500 mg | Freq: Once | INTRAVENOUS | Status: AC
  Administered 2017-05-24: 220 mg via INTRAVENOUS
  Filled 2017-05-24: qty 22

## 2017-05-24 MED ORDER — HEPARIN SOD (PORK) LOCK FLUSH 100 UNIT/ML IV SOLN
500.0000 [IU] | Freq: Once | INTRAVENOUS | Status: AC | PRN
  Administered 2017-05-24: 500 [IU]
  Filled 2017-05-24: qty 5

## 2017-05-24 MED ORDER — PALONOSETRON HCL INJECTION 0.25 MG/5ML
0.2500 mg | Freq: Once | INTRAVENOUS | Status: AC
  Administered 2017-05-24: 0.25 mg via INTRAVENOUS
  Filled 2017-05-24: qty 5

## 2017-05-24 MED ORDER — FAMOTIDINE IN NACL 20-0.9 MG/50ML-% IV SOLN
20.0000 mg | Freq: Once | INTRAVENOUS | Status: AC
  Administered 2017-05-24: 20 mg via INTRAVENOUS

## 2017-05-24 MED ORDER — SODIUM CHLORIDE 0.9 % IV SOLN
20.0000 mg | Freq: Once | INTRAVENOUS | Status: AC
  Administered 2017-05-24: 20 mg via INTRAVENOUS
  Filled 2017-05-24: qty 2

## 2017-05-24 MED ORDER — DIPHENHYDRAMINE HCL 50 MG/ML IJ SOLN
50.0000 mg | Freq: Once | INTRAMUSCULAR | Status: AC
  Administered 2017-05-24: 50 mg via INTRAVENOUS
  Filled 2017-05-24: qty 1

## 2017-05-25 ENCOUNTER — Inpatient Hospital Stay: Payer: 59

## 2017-05-25 DIAGNOSIS — D701 Agranulocytosis secondary to cancer chemotherapy: Secondary | ICD-10-CM

## 2017-05-25 DIAGNOSIS — C50411 Malignant neoplasm of upper-outer quadrant of right female breast: Secondary | ICD-10-CM | POA: Diagnosis not present

## 2017-05-25 DIAGNOSIS — T451X5A Adverse effect of antineoplastic and immunosuppressive drugs, initial encounter: Principal | ICD-10-CM

## 2017-05-25 MED ORDER — TBO-FILGRASTIM 480 MCG/0.8ML ~~LOC~~ SOSY
480.0000 ug | PREFILLED_SYRINGE | Freq: Every day | SUBCUTANEOUS | Status: DC
  Administered 2017-05-25: 480 ug via SUBCUTANEOUS

## 2017-05-26 ENCOUNTER — Ambulatory Visit: Payer: 59

## 2017-05-26 VITALS — BP 99/69 | HR 78 | Temp 99.4°F | Resp 18

## 2017-05-26 DIAGNOSIS — C50411 Malignant neoplasm of upper-outer quadrant of right female breast: Secondary | ICD-10-CM | POA: Diagnosis not present

## 2017-05-26 MED ORDER — TBO-FILGRASTIM 480 MCG/0.8ML ~~LOC~~ SOSY
480.0000 ug | PREFILLED_SYRINGE | Freq: Every day | SUBCUTANEOUS | Status: DC
  Administered 2017-05-26: 480 ug via SUBCUTANEOUS

## 2017-05-28 ENCOUNTER — Inpatient Hospital Stay: Payer: 59

## 2017-05-28 DIAGNOSIS — T451X5A Adverse effect of antineoplastic and immunosuppressive drugs, initial encounter: Principal | ICD-10-CM

## 2017-05-28 DIAGNOSIS — C50411 Malignant neoplasm of upper-outer quadrant of right female breast: Secondary | ICD-10-CM | POA: Diagnosis not present

## 2017-05-28 DIAGNOSIS — D701 Agranulocytosis secondary to cancer chemotherapy: Secondary | ICD-10-CM

## 2017-05-28 LAB — CBC
HEMATOCRIT: 30.7 % — AB (ref 35.0–47.0)
HEMOGLOBIN: 10.2 g/dL — AB (ref 12.0–16.0)
MCH: 27.7 pg (ref 26.0–34.0)
MCHC: 33.2 g/dL (ref 32.0–36.0)
MCV: 83.3 fL (ref 80.0–100.0)
Platelets: 167 10*3/uL (ref 150–440)
RBC: 3.68 MIL/uL — AB (ref 3.80–5.20)
RDW: 15.8 % — ABNORMAL HIGH (ref 11.5–14.5)
WBC: 6.3 10*3/uL (ref 3.6–11.0)

## 2017-05-31 ENCOUNTER — Inpatient Hospital Stay: Payer: 59 | Admitting: Oncology

## 2017-05-31 ENCOUNTER — Inpatient Hospital Stay: Payer: 59

## 2017-05-31 ENCOUNTER — Telehealth: Payer: Self-pay | Admitting: *Deleted

## 2017-05-31 NOTE — Telephone Encounter (Signed)
Called patient to inform her that her labs that were drawn at [place] on 12/26 the white count was too low to have chemo.  We will have to cancel chemo today.  Dr. Janese Banks would like patient to return back for lab work on January 3 with [facility], the form will be downstairs and the patient is agreeable to come by and pick up the form.  Her next chemotherapy will be January 8 but I will have to work that out with the schedulers and return her call with a new appointment.  Patient is agreeable to the above plan. I called pt back and let her know the chemo appt is 1/8 arrive for md appt 11 am and then chemo.  I also reminded her that when we look at labs 1/4 we may need her to get some injections of neupogen. She is agreeable .

## 2017-06-01 ENCOUNTER — Encounter: Payer: Self-pay | Admitting: Genetic Counselor

## 2017-06-01 ENCOUNTER — Telehealth: Payer: Self-pay | Admitting: Genetic Counselor

## 2017-06-01 NOTE — Telephone Encounter (Signed)
Ashley Molina was referred for genetic counseling by Dr. 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 I spoke with her this morning and she opted to schedule this phone appointment later today 06/01/17 at 2:30pm.    Steele Berg, [place], [facility] Genetic Counselor Phone: [phone]

## 2017-06-01 NOTE — Telephone Encounter (Signed)
Cancer Genetics            Telegenetics Initial Visit   Patient Name: Ashley Molina Patient DOB: 1960-10-26 Patient Age: 56 y.o. Phone Call Date: 06/01/2017  Referring Provider: Randa Evens, MD  Reason for Visit: Discuss results of genetic testing    History of Present Illness: Ashley Molina, a 56 y.o. female, was referred for genetic counseling to discuss results of recent genetic testing she had coordinated by Dr. Janese Molina. This was a telegenetics visit via phone.  Ashley Molina was diagnosed with breast cancer at the age of 31. She is currently undergoing neoadjuvant chemotherapy. The breast tumor was ER negative, PR negative, and HER2 negative.  Given her triple negative breast cancer, Ashley Molina had genetic analysis through [facility] [employer] [facility] using their VistaSeq hereditary cancer panel, which analyzed 27 genes: APC, ATM, BARD1, BMPR1A, BRCA1, BRCA2, BRIP1, CDH1, CDK4, CDKN2A, CHEK2, EPCAM, FAM175A, MLH1, MSH2, MSH6, MUTYH, NBN, PALB2, PMS2, PRKAR1A, PTEN, RAD51C, RAD51D, SMAD4, STK11, TP53.  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 A copy of the genetic test report is already scanned into Epic under Labs tab and dated 05/21/17.  Family History: Significant diagnoses include the following:  Family History  Problem Relation Age of Onset  . Melanoma Maternal Grandmother 73       currently 64  . Brain cancer Maternal Grandfather 4       unk. type; deceased in 31s  . Melanoma Other 82       mat grandmother's father  . Melanoma Father 21       on head; currently 67  . Prostate cancer Maternal Uncle        3 maternal uncles; dx in 49s  . Breast cancer Other        mat grandfather's sister; dx 12s    Additionally, Ashley Molina has a son (age 52) and two daughters (ages 50 and 5). She has a brother (age 9) and a sister (age 76).  Her mother (age 31) is cancer-free. Her father (age  13) is noted above.  Ashley Molina ancestry is Caucasian - NOS. There is no known Jewish ancestry and no consanguinity.  Discussion of MUTYH-Related Cancer Risks: : We reviewed the characteristics, features and inheritance patterns of hereditary cancer syndromes with a focus on the MUTYH gene. We discussed 1-2% of individuals of [place] descent are carriers of a MUTYH mutation. There is conflicting data regarding whether a single MUTYH mutation confers a moderate increased risk (up to 2-fold) for colorectal cancer. If an individual inherits two pathogenic mutations, they have a recessive form of hereditary colonic polyposis called MUTYH-Associated Polyposis (MAP). We discussed that this result does not explain Ashley Molina's cancer diagnosis and should not be over interpreted.  Medical Management: We emphasized that being a carrier of a single MUTYH mutation is not thought to significantly increase cancer risk. Ashley Molina is recommended to follow routine cancer screenings for colorectal cancer. The National Comprehensive Cancer Network Genetic/Familial High Risk Assessment: Colorectal (V3.2017) recommends the following in those with a single MUTYH mutation:  - Beginning at age 2 or 93 years younger than the earliest diagnosis of colorectal cancer in a parent, sibling, or child (whichever is earlier): Colonoscopy every 5 years. If there is no family history of colorectal cancer, data are uncertain if specialized screening is warranted. Since Ashley Molina  has not personal or family history of colorectal cancer and had a negative colonoscopy at age 72, her next colonoscopy is recommended at age 89.  - These recommendations may change if an individual has polyps, colorectal cancer, inflammatory bowel disease (IBD), or family history of colorectal cancer.  Family Members: It is important that Ashley Molina informs her relatives of these results. They are recommended to speak with a genetic  counselor prior to any testing. A genetic counselor can be located at [email or web address]. Her children have a 50% chance to have inherited this mutation. We discussed that one option would be for her husband to pursue testing to see whether he is a carrier of a MUTYH mutation as well. Only then would their children be at risk of having MAP. If he is not a carrier, they are not at risk of having MAP.   We encouraged Ashley Molina to remain in contact with [place] on an annual basis so we can update her personal and family histories, and let her know of advances in cancer genetics that may benefit the family. Our contact number was provided. Ashley Molina questions were answered to her satisfaction today, and she knows she is welcome to call anytime with additional questions.    Dr. Grayland Ormond was available for questions concerning this case. Total time spent by counseling by phone was approximately 30 minutes.    Steele Berg, MS, [place] Certified Genetic Counselor phone: [phone]

## 2017-06-06 ENCOUNTER — Encounter: Payer: Self-pay | Admitting: Oncology

## 2017-06-07 ENCOUNTER — Encounter: Payer: Self-pay | Admitting: Oncology

## 2017-06-12 ENCOUNTER — Inpatient Hospital Stay: Payer: Managed Care, Other (non HMO)

## 2017-06-12 ENCOUNTER — Inpatient Hospital Stay: Payer: Managed Care, Other (non HMO) | Attending: Oncology | Admitting: Oncology

## 2017-06-12 ENCOUNTER — Encounter: Payer: Self-pay | Admitting: Oncology

## 2017-06-12 VITALS — BP 114/77 | HR 72 | Temp 97.8°F | Resp 18 | Wt 201.0 lb

## 2017-06-12 DIAGNOSIS — D701 Agranulocytosis secondary to cancer chemotherapy: Secondary | ICD-10-CM | POA: Insufficient documentation

## 2017-06-12 DIAGNOSIS — Z803 Family history of malignant neoplasm of breast: Secondary | ICD-10-CM | POA: Insufficient documentation

## 2017-06-12 DIAGNOSIS — Z808 Family history of malignant neoplasm of other organs or systems: Secondary | ICD-10-CM | POA: Insufficient documentation

## 2017-06-12 DIAGNOSIS — Z5111 Encounter for antineoplastic chemotherapy: Secondary | ICD-10-CM | POA: Diagnosis not present

## 2017-06-12 DIAGNOSIS — C50411 Malignant neoplasm of upper-outer quadrant of right female breast: Secondary | ICD-10-CM | POA: Diagnosis present

## 2017-06-12 DIAGNOSIS — F1020 Alcohol dependence, uncomplicated: Secondary | ICD-10-CM | POA: Diagnosis not present

## 2017-06-12 DIAGNOSIS — D6481 Anemia due to antineoplastic chemotherapy: Secondary | ICD-10-CM | POA: Diagnosis not present

## 2017-06-12 DIAGNOSIS — C773 Secondary and unspecified malignant neoplasm of axilla and upper limb lymph nodes: Secondary | ICD-10-CM | POA: Diagnosis not present

## 2017-06-12 DIAGNOSIS — Z171 Estrogen receptor negative status [ER-]: Secondary | ICD-10-CM

## 2017-06-12 DIAGNOSIS — T451X5A Adverse effect of antineoplastic and immunosuppressive drugs, initial encounter: Secondary | ICD-10-CM

## 2017-06-12 DIAGNOSIS — Z8042 Family history of malignant neoplasm of prostate: Secondary | ICD-10-CM | POA: Insufficient documentation

## 2017-06-12 DIAGNOSIS — N6311 Unspecified lump in the right breast, upper outer quadrant: Secondary | ICD-10-CM

## 2017-06-12 DIAGNOSIS — D649 Anemia, unspecified: Secondary | ICD-10-CM

## 2017-06-12 NOTE — Progress Notes (Signed)
Hematology/Oncology Consult note [facility]  Telephone:([phone] [phone]  Patient Care Team: Patient, No Pcp Per as PCP - General (General Practice) Byrnett, Forest Gleason, MD (General Surgery) Gae Dry, MD as Referring Physician (Obstetrics and Gynecology)   Name of the patient: Ashley Molina  [id number]  Mar 03, 1961   Date of visit: 06/12/17 Meryle Ready mammary carcinoma of the right breastcT3cN1cM1ER negative, PR 5% positive and HER-2/neu negative  Chief complaint/ Reason for visit-on treatment assessment prior to cycle # 5of weekly carbotaxol  Heme/Onc history:1. Patient is a 57 year old postmenopausal female who had a routine screening mammogram on 03/23/2017 which showed a highly suspicious palpable solid mass in the 10 o'clock position. Mass measures at least 5 cm in maximum diameter for right axillary lymph nodes have sonographic features suspicious for metastatic disease. No evidence of malignancy was noted in the left breast. This was followed by an ultrasound of the right breast including the right axilla which confirmed the same findings.Targeted ultrasound is performed, showing a markedly irregular and hypoechoic mass centered at 10 o'clock position approximately 7 cm from the nipple estimated to be at least 5.2 x 4.4 x 3.4 cm. Small areas of vascular flow are detected within the mass. At 10 o'clock position 9 cm from the nipple again noted is a simple cyst measuring approximately 2.4 cm greatest diameter. Skin thickness of the medial and lateral of right breast measures approximately 4 mm.  Ultrasound of the right axilla demonstrates 4 suspicious lymph nodes with hypoechoic thickened cortices. The largest of these lymph nodes measures 2.2 x 1.2 x 1.0 cm and has a cortical thickness of 4-5 mm.  Patient underwent core biopsy of the right breast mass.  2. Biopsy showed 15 mm invasive carcinoma,  grade 3. Right axillary lymph node biopsy was also consistent with metastatic carcinoma. ERnegativePR5% positive,and HER-2negative by FISH  3. Patient is G3P3L3. Menarche at the age of 24. Menopause at 12. No priro h/o breast biopsies. No family h/o breast cancer. She was known to have right breast cyst being followed by Dr. Bary Castilla. She noticed this amss sometime over the summer and feels this has grown since then. She is healthy and has no significant co-morbidities  4. PET/CT scan on 04/04/17 showed:IMPRESSION: 1. Hypermetabolic right breast mass with hypermetabolic lymph nodes in the right axilla and hypermetabolic metastatic disease in both hilar regions and subcarinal mediastinum. 2. No evidence for hypermetabolic metastatic disease in the neck, abdomen or pelvis  5. Patient underwent bronchoscopy and biopsy of hilar LN which was positive for metastatic carcinoma from the breast. She also underwent RUl biopsy and bronchial washings that were negative for malignancy.  Baseline CA-15-3 normal at 16.9  And CA-27-29 normal at 15.8.  Interval history- feels well. Denies any fever or tingling numbness.   ECOG PS- 0 Pain scale- 0  Review of systems- Review of Systems  Constitutional: Negative for chills, fever, malaise/fatigue and weight loss.  HENT: Negative for congestion, ear discharge and nosebleeds.   Eyes: Negative for blurred vision.  Respiratory: Negative for cough, hemoptysis, sputum production, shortness of breath and wheezing.   Cardiovascular: Negative for chest pain, palpitations, orthopnea and claudication.  Gastrointestinal: Negative for abdominal pain, blood in stool, constipation, diarrhea, heartburn, melena, nausea and vomiting.  Genitourinary: Negative for dysuria, flank pain, frequency, hematuria and urgency.  Musculoskeletal: Negative for back pain, joint pain and myalgias.  Skin: Negative for rash.  Neurological: Negative for dizziness, tingling, focal  weakness, seizures, weakness  and headaches.  Endo/Heme/Allergies: Does not bruise/bleed easily.  Psychiatric/Behavioral: Negative for depression and suicidal ideas. The patient does not have insomnia.      Allergies  Allergen Reactions  . Penicillins Rash     Past Medical History:  Diagnosis Date  . Anemia   . Breast cancer ([place])   . Breast cyst, right    aspirated by Dr. Bary Castilla  . Hyperlipidemia   . Pre-diabetes      Past Surgical History:  Procedure Laterality Date  . AXILLARY LYMPH NODE BIOPSY Right 04/09/2017   Procedure: AXILLARY LYMPH NODE BIOPSY;  Surgeon: Robert Bellow, MD;  Location: [facility] ORS;  Service: General;  Laterality: Right;  . BREAST CYST ASPIRATION Right 11/2009   Dr. Bary Castilla did FNA  . COLONOSCOPY  2011  . DILATION AND CURETTAGE OF UTERUS     X3  . ENDOBRONCHIAL ULTRASOUND N/A 04/09/2017   Procedure: ENDOBRONCHIAL ULTRASOUND;  Surgeon: Laverle Hobby, MD;  Location: [facility] ORS;  Service: Pulmonary;  Laterality: N/A;  . ENDOMETRIAL ABLATION    . ENDOMETRIAL BIOPSY  09/2009  . PORTACATH PLACEMENT Left 04/09/2017   Procedure: INSERTION PORT-A-CATH;  Surgeon: Robert Bellow, MD;  Location: [facility] ORS;  Service: General;  Laterality: Left;    Social History   Socioeconomic History  . Marital status: Married    Spouse name: Not on file  . Number of children: Not on file  . Years of education: Not on file  . Highest education level: Not on file  Social Needs  . Financial resource strain: Not on file  . Food insecurity - worry: Not on file  . Food insecurity - inability: Not on file  . Transportation needs - medical: Not on file  . Transportation needs - non-medical: Not on file  Occupational History  . Not on file  Tobacco Use  . Smoking status: Never Smoker  . Smokeless tobacco: Never Used  Substance and Sexual Activity  . Alcohol use: Yes    Alcohol/week: 1.2 oz    Types: 2 Glasses of wine per week  . Drug use: No  . Sexual  activity: Not Currently  Other Topics Concern  . Not on file  Social History Narrative  . Not on file    Family History  Problem Relation Age of Onset  . Melanoma Maternal Grandmother 38       currently 70  . Brain cancer Maternal Grandfather 59       unk. type; deceased in 62s  . Melanoma Other 44       mat grandmother's father  . Melanoma Father 67       on head; currently 73  . Prostate cancer Maternal Uncle        3 maternal uncles; dx in 39s  . Breast cancer Other        mat grandfather's sister; dx 58s     Current Outpatient Medications:  .  Biotin 2500 MCG CAPS, Take 5,000 mcg/day by mouth daily., Disp: , Rfl:  .  Cholecalciferol (VITAMIN D3) 10000 units TABS, Take by mouth 1 day or 1 dose., Disp: , Rfl:  .  dexamethasone (DECADRON) 4 MG tablet, Take 2 tablets (8 mg total) by mouth daily. Start the day after chemotherapy for 2 days., Disp: 30 tablet, Rfl: 1 .  lidocaine-prilocaine (EMLA) cream, Apply to affected area once, Disp: 30 g, Rfl: 3 .  loratadine (CLARITIN) 10 MG tablet, Take 10 mg by mouth daily., Disp: , Rfl:  .  Multiple  Vitamins-Minerals (EMERGEN-C IMMUNE PLUS PO), Take 1 packet by mouth 1 day or 1 dose. With energy, Disp: , Rfl:  .  omega-3 acid ethyl esters (LOVAZA) 1 g capsule, Take by mouth 1 day or 1 dose., Disp: , Rfl:  .  vitamin E 400 UNIT capsule, Take 400 Units by mouth daily., Disp: , Rfl:  .  HYDROcodone-acetaminophen (NORCO) 5-325 MG tablet, Take 1-2 tablets every 4 (four) hours as needed by mouth. Take no more than 10 tablets daily. (Patient not taking: Reported on 04/19/2017), Disp: 30 tablet, Rfl: 0 .  LORazepam (ATIVAN) 0.5 MG tablet, Take 1 tablet (0.5 mg total) by mouth every 6 (six) hours as needed (Nausea or vomiting). (Patient not taking: Reported on 04/12/2017), Disp: 30 tablet, Rfl: 0 .  ondansetron (ZOFRAN) 8 MG tablet, Take 1 tablet (8 mg total) by mouth 2 (two) times daily as needed for refractory nausea / vomiting. Start on day 3 after  chemo. (Patient not taking: Reported on 04/12/2017), Disp: 30 tablet, Rfl: 1 .  prochlorperazine (COMPAZINE) 10 MG tablet, Take 1 tablet (10 mg total) by mouth every 6 (six) hours as needed (Nausea or vomiting). (Patient not taking: Reported on 04/12/2017), Disp: 30 tablet, Rfl: 1 No current facility-administered medications for this visit.   Facility-Administered Medications Ordered in Other Visits:  .  Tbo-Filgrastim (GRANIX) injection 480 mcg, 480 mcg, Subcutaneous, Daily, Sindy Guadeloupe, MD, 480 mcg at 05/19/17 1412  Physical exam:  Vitals:   06/12/17 1102  BP: 114/77  Pulse: 72  Resp: 18  Temp: 97.8 F (36.6 C)  TempSrc: Tympanic  Weight: 201 lb (91.2 kg)   Physical Exam  Constitutional: She is oriented to person, place, and time and well-developed, well-nourished, and in no distress.  HENT:  Head: Normocephalic and atraumatic.  Eyes: EOM are normal. Pupils are equal, round, and reactive to light.  Neck: Normal range of motion.  Cardiovascular: Normal rate, regular rhythm and normal heart sounds.  Pulmonary/Chest: Effort normal and breath sounds normal.  Abdominal: Soft. Bowel sounds are normal.  Neurological: She is alert and oriented to person, place, and time.  Skin: Skin is warm and dry.   right breast mass can still be palpated but appears smaller and more diffuse/ softer  CMP Latest Ref Rng & Units 03/28/2017  Glucose 65 - 99 mg/dL 102(H)  BUN 6 - 24 mg/dL 15  Creatinine 0.57 - 1.00 mg/dL 0.82  Sodium 134 - 144 mmol/L 140  Potassium 3.5 - 5.2 mmol/L 4.1  Chloride 96 - 106 mmol/L 106  CO2 20 - 29 mmol/L 22  Calcium 8.7 - 10.2 mg/dL 9.3  Total Protein 6.0 - 8.5 g/dL 6.9  Total Bilirubin 0.0 - 1.2 mg/dL 0.4  Alkaline Phos 39 - 117 IU/L 89  AST 0 - 40 IU/L 19  ALT 0 - 32 IU/L 22   CBC Latest Ref Rng & Units 05/28/2017  WBC 3.6 - 11.0 K/uL 6.3  Hemoglobin 12.0 - 16.0 g/dL 10.2(L)  Hematocrit 35.0 - 47.0 % 30.7(L)  Platelets 150 - 440 K/uL 167    No images are  attached to the encounter.  No results found.   Assessment and plan- Patient is a 57 y.o. 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  Given that she is receiving weekly chemotherapy I have been unable to give her Neulasta.  Therefore at this time I will plan to switch her to dose dense carbotaxol with Neulasta every 2 weeks for the remainder of the cycles which means she would need 3 more every 2 weekly cycles.  Patient has switched to a new insurance and we have to therefore obtain insurance approval for this regimen.  I am unable to give her chemotherapy at this time.  I will tentatively start chemotherapy in 1 week's time.  Will get CBC and met C a day prior.  Patient is in understanding of the plan.  She will also proceed with ultrasound of her right breast which is scheduled with Dr. Bary Castilla in February.     Visit Diagnosis 1. Mass of upper outer quadrant of right breast   2. Malignant neoplasm of upper-outer quadrant of right breast in female, estrogen receptor negative ([place])   3. Chemotherapy induced neutropenia (HCC)      Dr. Randa Evens, MD, MPH [facility] at [facility] Pager- [phone] 06/12/2017

## 2017-06-13 ENCOUNTER — Other Ambulatory Visit: Payer: Self-pay | Admitting: Oncology

## 2017-06-13 ENCOUNTER — Telehealth: Payer: Self-pay | Admitting: *Deleted

## 2017-06-13 NOTE — Telephone Encounter (Signed)
Called pt and left message that Dr. Janese Banks wanted her to know that she is still giving her the carbo/taxol but instead of every 3 weeks to every 2 weeks. She will still get the neulasta. She will get a smaller dose than the 3 week dosing. If she has questions she can call me.

## 2017-06-19 ENCOUNTER — Encounter: Payer: Self-pay | Admitting: Oncology

## 2017-06-19 ENCOUNTER — Inpatient Hospital Stay: Payer: Managed Care, Other (non HMO)

## 2017-06-19 ENCOUNTER — Inpatient Hospital Stay (HOSPITAL_BASED_OUTPATIENT_CLINIC_OR_DEPARTMENT_OTHER): Payer: Managed Care, Other (non HMO) | Admitting: Oncology

## 2017-06-19 VITALS — BP 121/77 | HR 77 | Temp 96.8°F | Resp 16 | Wt 202.0 lb

## 2017-06-19 VITALS — BP 113/75 | HR 66 | Resp 18

## 2017-06-19 DIAGNOSIS — Z808 Family history of malignant neoplasm of other organs or systems: Secondary | ICD-10-CM | POA: Diagnosis not present

## 2017-06-19 DIAGNOSIS — Z803 Family history of malignant neoplasm of breast: Secondary | ICD-10-CM | POA: Diagnosis not present

## 2017-06-19 DIAGNOSIS — T451X5A Adverse effect of antineoplastic and immunosuppressive drugs, initial encounter: Secondary | ICD-10-CM

## 2017-06-19 DIAGNOSIS — Z171 Estrogen receptor negative status [ER-]: Principal | ICD-10-CM

## 2017-06-19 DIAGNOSIS — Z5111 Encounter for antineoplastic chemotherapy: Secondary | ICD-10-CM

## 2017-06-19 DIAGNOSIS — D701 Agranulocytosis secondary to cancer chemotherapy: Secondary | ICD-10-CM | POA: Diagnosis not present

## 2017-06-19 DIAGNOSIS — C773 Secondary and unspecified malignant neoplasm of axilla and upper limb lymph nodes: Secondary | ICD-10-CM | POA: Diagnosis not present

## 2017-06-19 DIAGNOSIS — C50411 Malignant neoplasm of upper-outer quadrant of right female breast: Secondary | ICD-10-CM

## 2017-06-19 DIAGNOSIS — Z8042 Family history of malignant neoplasm of prostate: Secondary | ICD-10-CM

## 2017-06-19 DIAGNOSIS — F1020 Alcohol dependence, uncomplicated: Secondary | ICD-10-CM

## 2017-06-19 DIAGNOSIS — D649 Anemia, unspecified: Secondary | ICD-10-CM

## 2017-06-19 MED ORDER — PALONOSETRON HCL INJECTION 0.25 MG/5ML
0.2500 mg | Freq: Once | INTRAVENOUS | Status: AC
Start: 2017-06-19 — End: 2017-06-19
  Administered 2017-06-19: 0.25 mg via INTRAVENOUS
  Filled 2017-06-19: qty 5

## 2017-06-19 MED ORDER — SODIUM CHLORIDE 0.9 % IV SOLN
Freq: Once | INTRAVENOUS | Status: AC
  Administered 2017-06-19: 09:00:00 via INTRAVENOUS
  Filled 2017-06-19: qty 1000

## 2017-06-19 MED ORDER — SODIUM CHLORIDE 0.9% FLUSH
10.0000 mL | INTRAVENOUS | Status: DC | PRN
  Administered 2017-06-19: 10 mL
  Filled 2017-06-19: qty 10

## 2017-06-19 MED ORDER — FAMOTIDINE IN NACL 20-0.9 MG/50ML-% IV SOLN
20.0000 mg | Freq: Once | INTRAVENOUS | Status: AC
  Administered 2017-06-19: 20 mg via INTRAVENOUS
  Filled 2017-06-19: qty 50

## 2017-06-19 MED ORDER — PEGFILGRASTIM 6 MG/0.6ML ~~LOC~~ PSKT
6.0000 mg | PREFILLED_SYRINGE | Freq: Once | SUBCUTANEOUS | Status: AC
  Administered 2017-06-19: 6 mg via SUBCUTANEOUS
  Filled 2017-06-19: qty 0.6

## 2017-06-19 MED ORDER — DIPHENHYDRAMINE HCL 50 MG/ML IJ SOLN
50.0000 mg | Freq: Once | INTRAMUSCULAR | Status: AC
  Administered 2017-06-19: 50 mg via INTRAVENOUS
  Filled 2017-06-19: qty 1

## 2017-06-19 MED ORDER — SODIUM CHLORIDE 0.9 % IV SOLN
20.0000 mg | Freq: Once | INTRAVENOUS | Status: AC
  Administered 2017-06-19: 20 mg via INTRAVENOUS
  Filled 2017-06-19: qty 2

## 2017-06-19 MED ORDER — SODIUM CHLORIDE 0.9 % IV SOLN
439.5000 mg | Freq: Once | INTRAVENOUS | Status: AC
  Administered 2017-06-19: 440 mg via INTRAVENOUS
  Filled 2017-06-19: qty 44

## 2017-06-19 MED ORDER — SODIUM CHLORIDE 0.9 % IV SOLN
300.0000 mg | Freq: Once | INTRAVENOUS | Status: AC
  Administered 2017-06-19: 300 mg via INTRAVENOUS
  Filled 2017-06-19: qty 50

## 2017-06-19 MED ORDER — HEPARIN SOD (PORK) LOCK FLUSH 100 UNIT/ML IV SOLN
500.0000 [IU] | Freq: Once | INTRAVENOUS | Status: AC | PRN
  Administered 2017-06-19: 500 [IU]
  Filled 2017-06-19: qty 5

## 2017-06-19 NOTE — Progress Notes (Signed)
Hematology/Oncology Consult note [facility]  Telephone:([phone] Fax:([phone]  Patient Care Team: Patient, No Pcp Per as PCP - General (General Practice) Byrnett, Forest Gleason, MD (General Surgery) Gae Dry, MD as Referring Physician (Obstetrics and Gynecology)   Name of the patient: Ashley Molina  [id number]  1960-10-15   Date of visit: 06/19/17  Meryle Ready mammary carcinoma of the right breastcT3cN1cM1ER negative, PR 5% positive and HER-2/neu negative  Chief complaint/ Reason for visit-on treatment assessment prior to cycle #of 7 of dose dense q2 weekly carbotaxol  Heme/Onc history:1. Patient is a 57 year old postmenopausal female who had a routine screening mammogram on 03/23/2017 which showed a highly suspicious palpable solid mass in the 10 o'clock position. Mass measures at least 5 cm in maximum diameter for right axillary lymph nodes have sonographic features suspicious for metastatic disease. No evidence of malignancy was noted in the left breast. This was followed by an ultrasound of the right breast including the right axilla which confirmed the same findings.Targeted ultrasound is performed, showing a markedly irregular and hypoechoic mass centered at 10 o'clock position approximately 7 cm from the nipple estimated to be at least 5.2 x 4.4 x 3.4 cm. Small areas of vascular flow are detected within the mass. At 10 o'clock position 9 cm from the nipple again noted is a simple cyst measuring approximately 2.4 cm greatest diameter. Skin thickness of the medial and lateral of right breast measures approximately 4 mm.  Ultrasound of the right axilla demonstrates 4 suspicious lymph nodes with hypoechoic thickened cortices. The largest of these lymph nodes measures 2.2 x 1.2 x 1.0 cm and has a cortical thickness of 4-5 mm.  Patient underwent core biopsy of the right breast mass.  2. Biopsy showed 15 mm  invasive carcinoma, grade 3. Right axillary lymph node biopsy was also consistent with metastatic carcinoma. ERnegativePR5% positive,and HER-2negative by FISH  3. Patient is G3P3L3. Menarche at the age of 16. Menopause at 12. No priro h/o breast biopsies. No family h/o breast cancer. She was known to have right breast cyst being followed by Dr. Bary Castilla. She noticed this amss sometime over the summer and feels this has grown since then. She is healthy and has no significant co-morbidities  4. PET/CT scan on 04/04/17 showed:IMPRESSION: 1. Hypermetabolic right breast mass with hypermetabolic lymph nodes in the right axilla and hypermetabolic metastatic disease in both hilar regions and subcarinal mediastinum. 2. No evidence for hypermetabolic metastatic disease in the neck, abdomen or pelvis  5. Patient underwent bronchoscopy and biopsy of hilar LN which was positive for metastatic carcinoma from the breast. She also underwent RUl biopsy and bronchial washings that were negative for malignancy.Baseline CA-15-3 normal at 16.9 And CA-27-29 normal at 15.8.  6.  Patient has had significant neutropenia despite dose reduction of carboplatin and adding Neupogen requiring dose interruptions.  Plan is therefore to switch her to every 2 weekly cycle of carbotaxol for the remainder of cycles with on pro-Neulasta support    Interval history- doing well. Denies any complaints. Denies any tingling numbness in her toes or fingers  ECOG PS- 0 Pain scale- 0   Review of systems- Review of Systems  Constitutional: Negative for chills, fever, malaise/fatigue and weight loss.  HENT: Negative for congestion, ear discharge and nosebleeds.   Eyes: Negative for blurred vision.  Respiratory: Negative for cough, hemoptysis, sputum production, shortness of breath and wheezing.   Cardiovascular: Negative for chest pain, palpitations, orthopnea and claudication.  Gastrointestinal: Negative for abdominal  pain, blood in stool, constipation, diarrhea, heartburn, melena, nausea and vomiting.  Genitourinary: Negative for dysuria, flank pain, frequency, hematuria and urgency.  Musculoskeletal: Negative for back pain, joint pain and myalgias.  Skin: Negative for rash.  Neurological: Negative for dizziness, tingling, focal weakness, seizures, weakness and headaches.  Endo/Heme/Allergies: Does not bruise/bleed easily.  Psychiatric/Behavioral: Negative for depression and suicidal ideas. The patient does not have insomnia.       Allergies  Allergen Reactions  . Penicillins Rash     Past Medical History:  Diagnosis Date  . Anemia   . Breast cancer ([place])   . Breast cyst, right    aspirated by Dr. Bary Castilla  . Hyperlipidemia   . Pre-diabetes      Past Surgical History:  Procedure Laterality Date  . AXILLARY LYMPH NODE BIOPSY Right 04/09/2017   Procedure: AXILLARY LYMPH NODE BIOPSY;  Surgeon: Robert Bellow, MD;  Location: [facility] ORS;  Service: General;  Laterality: Right;  . BREAST CYST ASPIRATION Right 11/2009   Dr. Bary Castilla did FNA  . COLONOSCOPY  2011  . DILATION AND CURETTAGE OF UTERUS     X3  . ENDOBRONCHIAL ULTRASOUND N/A 04/09/2017   Procedure: ENDOBRONCHIAL ULTRASOUND;  Surgeon: Laverle Hobby, MD;  Location: [facility] ORS;  Service: Pulmonary;  Laterality: N/A;  . ENDOMETRIAL ABLATION    . ENDOMETRIAL BIOPSY  09/2009  . PORTACATH PLACEMENT Left 04/09/2017   Procedure: INSERTION PORT-A-CATH;  Surgeon: Robert Bellow, MD;  Location: [facility] ORS;  Service: General;  Laterality: Left;    Social History   Socioeconomic History  . Marital status: Married    Spouse name: Not on file  . Number of children: Not on file  . Years of education: Not on file  . Highest education level: Not on file  Social Needs  . Financial resource strain: Not on file  . Food insecurity - worry: Not on file  . Food insecurity - inability: Not on file  . Transportation needs - medical: Not on  file  . Transportation needs - non-medical: Not on file  Occupational History  . Not on file  Tobacco Use  . Smoking status: Never Smoker  . Smokeless tobacco: Never Used  Substance and Sexual Activity  . Alcohol use: Yes    Alcohol/week: 1.2 oz    Types: 2 Glasses of wine per week  . Drug use: No  . Sexual activity: Not Currently  Other Topics Concern  . Not on file  Social History Narrative  . Not on file    Family History  Problem Relation Age of Onset  . Melanoma Maternal Grandmother 2       currently 74  . Brain cancer Maternal Grandfather 25       unk. type; deceased in 2s  . Melanoma Other 1       mat grandmother's father  . Melanoma Father 44       on head; currently 63  . Prostate cancer Maternal Uncle        3 maternal uncles; dx in 59s  . Breast cancer Other        mat grandfather's sister; dx 56s     Current Outpatient Medications:  .  Biotin 2500 MCG CAPS, Take 5,000 mcg/day by mouth daily., Disp: , Rfl:  .  Cholecalciferol (VITAMIN D3) 10000 units TABS, Take by mouth 1 day or 1 dose., Disp: , Rfl:  .  dexamethasone (DECADRON) 4 MG tablet, Take 2 tablets (8  mg total) by mouth daily. Start the day after chemotherapy for 2 days., Disp: 30 tablet, Rfl: 1 .  HYDROcodone-acetaminophen (NORCO) 5-325 MG tablet, Take 1-2 tablets every 4 (four) hours as needed by mouth. Take no more than 10 tablets daily. (Patient not taking: Reported on 04/19/2017), Disp: 30 tablet, Rfl: 0 .  lidocaine-prilocaine (EMLA) cream, Apply to affected area once, Disp: 30 g, Rfl: 3 .  loratadine (CLARITIN) 10 MG tablet, Take 10 mg by mouth daily., Disp: , Rfl:  .  LORazepam (ATIVAN) 0.5 MG tablet, Take 1 tablet (0.5 mg total) by mouth every 6 (six) hours as needed (Nausea or vomiting). (Patient not taking: Reported on 04/12/2017), Disp: 30 tablet, Rfl: 0 .  Multiple Vitamins-Minerals (EMERGEN-C IMMUNE PLUS PO), Take 1 packet by mouth 1 day or 1 dose. With energy, Disp: , Rfl:  .  omega-3  acid ethyl esters (LOVAZA) 1 g capsule, Take by mouth 1 day or 1 dose., Disp: , Rfl:  .  ondansetron (ZOFRAN) 8 MG tablet, Take 1 tablet (8 mg total) by mouth 2 (two) times daily as needed for refractory nausea / vomiting. Start on day 3 after chemo. (Patient not taking: Reported on 04/12/2017), Disp: 30 tablet, Rfl: 1 .  prochlorperazine (COMPAZINE) 10 MG tablet, Take 1 tablet (10 mg total) by mouth every 6 (six) hours as needed (Nausea or vomiting). (Patient not taking: Reported on 04/12/2017), Disp: 30 tablet, Rfl: 1 .  vitamin E 400 UNIT capsule, Take 400 Units by mouth daily., Disp: , Rfl:  No current facility-administered medications for this visit.   Facility-Administered Medications Ordered in Other Visits:  .  Tbo-Filgrastim (GRANIX) injection 480 mcg, 480 mcg, Subcutaneous, Daily, Sindy Guadeloupe, MD, 480 mcg at 05/19/17 1412  Physical exam:  Vitals:   06/19/17 0844  BP: 121/77  Pulse: 77  Resp: 16  Temp: (!) 96.8 F (36 C)  TempSrc: Tympanic  Weight: 202 lb (91.6 kg)   Physical Exam  Constitutional: She is oriented to person, place, and time and well-developed, well-nourished, and in no distress.  HENT:  Head: Normocephalic and atraumatic.  Eyes: EOM are normal. Pupils are equal, round, and reactive to light.  Neck: Normal range of motion.  Cardiovascular: Normal rate, regular rhythm and normal heart sounds.  Pulmonary/Chest: Effort normal and breath sounds normal.  Abdominal: Soft. Bowel sounds are normal.  Neurological: She is alert and oriented to person, place, and time.  Skin: Skin is warm and dry.   right breast mass still palpable but more diffuse and hard to measure  CMP Latest Ref Rng & Units 03/28/2017  Glucose 65 - 99 mg/dL 102(H)  BUN 6 - 24 mg/dL 15  Creatinine 0.57 - 1.00 mg/dL 0.82  Sodium 134 - 144 mmol/L 140  Potassium 3.5 - 5.2 mmol/L 4.1  Chloride 96 - 106 mmol/L 106  CO2 20 - 29 mmol/L 22  Calcium 8.7 - 10.2 mg/dL 9.3  Total Protein 6.0 - 8.5 g/dL  6.9  Total Bilirubin 0.0 - 1.2 mg/dL 0.4  Alkaline Phos 39 - 117 IU/L 89  AST 0 - 40 IU/L 19  ALT 0 - 32 IU/L 22   CBC Latest Ref Rng & Units 05/28/2017  WBC 3.6 - 11.0 K/uL 6.3  Hemoglobin 12.0 - 16.0 g/dL 10.2(L)  Hematocrit 35.0 - 47.0 % 30.7(L)  Platelets 150 - 440 K/uL 167      Assessment and plan- Patient is a 57 y.o. female withStage IV triple negative invasive breast carcinoma of  the right breast T3N1M1 with metastases to hilum and subcarinal nodeshere for on treatment assessment prior to cycle #7 (she will be getting q2 weekly carbo/taxol with onpro neulasta for 3 more cycles)  White count is 2.8 with an [place] was 1.5 when checked yesterday. counts okay to proceed with cycle #7 of carbotaxol today which will be given with on pro-Neulasta support every 2 weeks.  She will be getting carboplatin at AUC 3 along with Taxol at 150 mg/m square.  I will plan to repeat her PET scan and breast ultrasound after 6 weeks. Breast USG scheduled for 07/24/17. Patient will reschedule her appointment with Dr. Alinda Money after PET and USG  I will see her back in 2 weeks with cbc, cmp for cycle 8 of q2 weekly carbo/taxol     Visit Diagnosis 1. Malignant neoplasm of upper-outer quadrant of right breast in female, estrogen receptor negative ([place])   2. Encounter for antineoplastic chemotherapy   3. Chemotherapy induced neutropenia (HCC)      Dr. Randa Evens, MD, MPH [facility] Pager- [phone] 06/19/2017 9:45 AM

## 2017-07-02 ENCOUNTER — Encounter: Payer: Self-pay | Admitting: Oncology

## 2017-07-02 NOTE — Progress Notes (Signed)
Hematology/Oncology Consult note [facility]  Telephone:([phone] [phone]  Patient Care Team: Patient, No Pcp Per as PCP - General (General Practice) Byrnett, Forest Gleason, MD (General Surgery) Gae Dry, MD as Referring Physician (Obstetrics and Gynecology)   Name of the patient: Ashley Molina  [id number]  1961/03/26   Date of visit: 07/02/17  Meryle Ready mammary carcinoma of the right [id number] negative, PR 5% positive and HER-2/neu negative  Chief complaint/ Reason for visit-on treatment assessment prior to cycle # 8 of Q2 weekly carbotaxol  Heme/Onc history:1. Patient is a 57 year old postmenopausal female who had a routine screening mammogram on 03/23/2017 which showed a highly suspicious palpable solid mass in the 10 o'clock position. Mass measures at least 5 cm in maximum diameter for right axillary lymph nodes have sonographic features suspicious for metastatic disease. No evidence of malignancy was noted in the left breast. This was followed by an ultrasound of the right breast including the right axilla which confirmed the same findings.Targeted ultrasound is performed, showing a markedly irregular and hypoechoic mass centered at 10 o'clock position approximately 7 cm from the nipple estimated to be at least 5.2 x 4.4 x 3.4 cm. Small areas of vascular flow are detected within the mass. At 10 o'clock position 9 cm from the nipple again noted is a simple cyst measuring approximately 2.4 cm greatest diameter. Skin thickness of the medial and lateral of right breast measures approximately 4 mm.  Ultrasound of the right axilla demonstrates 4 suspicious lymph nodes with hypoechoic thickened cortices. The largest of these lymph nodes measures 2.2 x 1.2 x 1.0 cm and has a cortical thickness of 4-5 mm.  Patient underwent core biopsy of the right breast mass.  2. Biopsy showed 15 mm invasive  carcinoma, grade 3. Right axillary lymph node biopsy was also consistent with metastatic carcinoma. ERnegativePR5% positive,and HER-2negative by FISH  3. Patient is G3P3L3. Menarche at the age of 56. Menopause at 3. No priro h/o breast biopsies. No family h/o breast cancer. She was known to have right breast cyst being followed by Dr. Bary Castilla. She noticed this amss sometime over the summer and feels this has grown since then. She is healthy and has no significant co-morbidities  4. PET/CT scan on 04/04/17 showed:IMPRESSION: 1. Hypermetabolic right breast mass with hypermetabolic lymph nodes in the right axilla and hypermetabolic metastatic disease in both hilar regions and subcarinal mediastinum. 2. No evidence for hypermetabolic metastatic disease in the neck, abdomen or pelvis  5. Patient underwent bronchoscopy and biopsy of hilar LN which was positive for metastatic carcinoma from the breast. She also underwent RUl biopsy and bronchial washings that were negative for malignancy.Baseline CA-15-3 normal at 16.9 And CA-27-29 normal at 15.8.  6.  Patient has had significant neutropenia despite dose reduction of carboplatin and adding Neupogen requiring dose interruptions.  Plan is therefore to switch her to every 2 weekly cycle of carbotaxol for the remainder of cycles with on pro-Neulasta support   Interval history-she felt she had flulike illness about 3-4 days after chemotherapy when she felt achy and tired.  Symptoms lasted for about 2 days and then got better.  She denies any fever or mouth sores.  Denies any constipation diarrhea or pain.  Denies any tingling numbness in her extremities  ECOG PS- 0 Pain scale- 0   Review of systems- Review of Systems  Constitutional: Negative for chills, fever, malaise/fatigue and weight loss.  HENT: Negative for congestion, ear discharge and  nosebleeds.   Eyes: Negative for blurred vision.  Respiratory: Negative for cough, hemoptysis,  sputum production, shortness of breath and wheezing.   Cardiovascular: Negative for chest pain, palpitations, orthopnea and claudication.  Gastrointestinal: Negative for abdominal pain, blood in stool, constipation, diarrhea, heartburn, melena, nausea and vomiting.  Genitourinary: Negative for dysuria, flank pain, frequency, hematuria and urgency.  Musculoskeletal: Negative for back pain, joint pain and myalgias.  Skin: Negative for rash.  Neurological: Negative for dizziness, tingling, focal weakness, seizures, weakness and headaches.  Endo/Heme/Allergies: Does not bruise/bleed easily.  Psychiatric/Behavioral: Negative for depression and suicidal ideas. The patient does not have insomnia.       Allergies  Allergen Reactions  . Penicillins Rash     Past Medical History:  Diagnosis Date  . Anemia   . Breast cancer ([place])   . Breast cyst, right    aspirated by Dr. Bary Castilla  . Hyperlipidemia   . Pre-diabetes      Past Surgical History:  Procedure Laterality Date  . AXILLARY LYMPH NODE BIOPSY Right 04/09/2017   Procedure: AXILLARY LYMPH NODE BIOPSY;  Surgeon: Robert Bellow, MD;  Location: [facility] ORS;  Service: General;  Laterality: Right;  . BREAST CYST ASPIRATION Right 11/2009   Dr. Bary Castilla did FNA  . COLONOSCOPY  2011  . DILATION AND CURETTAGE OF UTERUS     X3  . ENDOBRONCHIAL ULTRASOUND N/A 04/09/2017   Procedure: ENDOBRONCHIAL ULTRASOUND;  Surgeon: Laverle Hobby, MD;  Location: [facility] ORS;  Service: Pulmonary;  Laterality: N/A;  . ENDOMETRIAL ABLATION    . ENDOMETRIAL BIOPSY  09/2009  . PORTACATH PLACEMENT Left 04/09/2017   Procedure: INSERTION PORT-A-CATH;  Surgeon: Robert Bellow, MD;  Location: [facility] ORS;  Service: General;  Laterality: Left;    Social History   Socioeconomic History  . Marital status: Married    Spouse name: Not on file  . Number of children: Not on file  . Years of education: Not on file  . Highest education level: Not on file  Social  Needs  . Financial resource strain: Not on file  . Food insecurity - worry: Not on file  . Food insecurity - inability: Not on file  . Transportation needs - medical: Not on file  . Transportation needs - non-medical: Not on file  Occupational History  . Not on file  Tobacco Use  . Smoking status: Never Smoker  . Smokeless tobacco: Never Used  Substance and Sexual Activity  . Alcohol use: Yes    Alcohol/week: 1.2 oz    Types: 2 Glasses of wine per week  . Drug use: No  . Sexual activity: Not Currently  Other Topics Concern  . Not on file  Social History Narrative  . Not on file    Family History  Problem Relation Age of Onset  . Melanoma Maternal Grandmother 63       currently 71  . Brain cancer Maternal Grandfather 2       unk. type; deceased in 10s  . Melanoma Other 77       mat grandmother's father  . Melanoma Father 71       on head; currently 25  . Prostate cancer Maternal Uncle        3 maternal uncles; dx in 66s  . Breast cancer Other        mat grandfather's sister; dx 68s     Current Outpatient Medications:  .  Biotin 2500 MCG CAPS, Take 5,000 mcg/day by mouth daily., Disp: ,  Rfl:  .  Cholecalciferol (VITAMIN D3) 10000 units TABS, Take by mouth 1 day or 1 dose., Disp: , Rfl:  .  dexamethasone (DECADRON) 4 MG tablet, Take 2 tablets (8 mg total) by mouth daily. Start the day after chemotherapy for 2 days., Disp: 30 tablet, Rfl: 1 .  lidocaine-prilocaine (EMLA) cream, Apply to affected area once, Disp: 30 g, Rfl: 3 .  loratadine (CLARITIN) 10 MG tablet, Take 10 mg by mouth daily., Disp: , Rfl:  .  LORazepam (ATIVAN) 0.5 MG tablet, Take 1 tablet (0.5 mg total) by mouth every 6 (six) hours as needed (Nausea or vomiting). (Patient not taking: Reported on 04/12/2017), Disp: 30 tablet, Rfl: 0 .  Multiple Vitamins-Minerals (EMERGEN-C IMMUNE PLUS PO), Take 1 packet by mouth 1 day or 1 dose. With energy, Disp: , Rfl:  .  omega-3 acid ethyl esters (LOVAZA) 1 g capsule,  Take by mouth 1 day or 1 dose., Disp: , Rfl:  .  ondansetron (ZOFRAN) 8 MG tablet, Take 1 tablet (8 mg total) by mouth 2 (two) times daily as needed for refractory nausea / vomiting. Start on day 3 after chemo. (Patient not taking: Reported on 04/12/2017), Disp: 30 tablet, Rfl: 1 .  prochlorperazine (COMPAZINE) 10 MG tablet, Take 1 tablet (10 mg total) by mouth every 6 (six) hours as needed (Nausea or vomiting). (Patient not taking: Reported on 04/12/2017), Disp: 30 tablet, Rfl: 1 No current facility-administered medications for this visit.   Facility-Administered Medications Ordered in Other Visits:  .  Tbo-Filgrastim (GRANIX) injection 480 mcg, 480 mcg, Subcutaneous, Daily, Sindy Guadeloupe, MD, 480 mcg at 05/19/17 1412  Physical exam:  Vitals:   07/03/17 0857  BP: 119/85  Pulse: 71  Resp: 18  Temp: 98.2 F (36.8 C)  TempSrc: Tympanic  Weight: 202 lb (91.6 kg)   Physical Exam  Constitutional: She is oriented to person, place, and time and well-developed, well-nourished, and in no distress.  HENT:  Head: Normocephalic and atraumatic.  Eyes: EOM are normal. Pupils are equal, round, and reactive to light.  Neck: Normal range of motion.  Cardiovascular: Normal rate, regular rhythm and normal heart sounds.  Pulmonary/Chest: Effort normal and breath sounds normal.  Abdominal: Soft. Bowel sounds are normal.  Neurological: She is alert and oriented to person, place, and time.  Skin: Skin is warm and dry.  Right breast mass does appear smaller in size but still palpable and about 2 cm in size  CMP Latest Ref Rng & Units 03/28/2017  Glucose 65 - 99 mg/dL 102(H)  BUN 6 - 24 mg/dL 15  Creatinine 0.57 - 1.00 mg/dL 0.82  Sodium 134 - 144 mmol/L 140  Potassium 3.5 - 5.2 mmol/L 4.1  Chloride 96 - 106 mmol/L 106  CO2 20 - 29 mmol/L 22  Calcium 8.7 - 10.2 mg/dL 9.3  Total Protein 6.0 - 8.5 g/dL 6.9  Total Bilirubin 0.0 - 1.2 mg/dL 0.4  Alkaline Phos 39 - 117 IU/L 89  AST 0 - 40 IU/L 19  ALT  0 - 32 IU/L 22   CBC Latest Ref Rng & Units 05/28/2017  WBC 3.6 - 11.0 K/uL 6.3  Hemoglobin 12.0 - 16.0 g/dL 10.2(L)  Hematocrit 35.0 - 47.0 % 30.7(L)  Platelets 150 - 440 K/uL 167     Assessment and plan- Patient is a 57 y.o. 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  H&H was 10.8/32.6 with a platelet count of 166.  CMP was normal except for a mildly elevated ALT of 50.  Counts are therefore okay to proceed with cycle #8 of every 2 weekly carbotaxol today.  I will see her back in 2 weeks time for her last cycle of carbotaxol with on pro-Neulasta support.  Repeat PET/CT scan on 07/19/2017 as well as ultrasound of the right breast on 07/24/2017 to assess response to treatment.  She will then see Dr. Alinda Money between 07/25/2017 to 07/27/2017   if she has excellent response to treatment including resolution of her hilar adenopathy-she would likely proceed to 4 cycles of dose dense AC chemotherapy  Chemo-induced anemia: Stable continue to monitor      Visit Diagnosis 1. Malignant neoplasm of upper-outer quadrant of right breast in female, estrogen receptor negative ([place])   2. Chemotherapy induced neutropenia ([facility])   3. Encounter for antineoplastic chemotherapy   4. Anemia due to antineoplastic chemotherapy      Dr. Randa Evens, MD, MPH [facility] Pager- [phone] 07/03/2017 9:10 AM

## 2017-07-03 ENCOUNTER — Inpatient Hospital Stay: Payer: Managed Care, Other (non HMO)

## 2017-07-03 ENCOUNTER — Encounter: Payer: Self-pay | Admitting: Oncology

## 2017-07-03 ENCOUNTER — Inpatient Hospital Stay (HOSPITAL_BASED_OUTPATIENT_CLINIC_OR_DEPARTMENT_OTHER): Payer: Managed Care, Other (non HMO) | Admitting: Oncology

## 2017-07-03 VITALS — BP 119/85 | HR 71 | Temp 98.2°F | Resp 18 | Wt 202.0 lb

## 2017-07-03 DIAGNOSIS — D6481 Anemia due to antineoplastic chemotherapy: Secondary | ICD-10-CM

## 2017-07-03 DIAGNOSIS — D701 Agranulocytosis secondary to cancer chemotherapy: Secondary | ICD-10-CM

## 2017-07-03 DIAGNOSIS — Z171 Estrogen receptor negative status [ER-]: Principal | ICD-10-CM

## 2017-07-03 DIAGNOSIS — C50411 Malignant neoplasm of upper-outer quadrant of right female breast: Secondary | ICD-10-CM

## 2017-07-03 DIAGNOSIS — C773 Secondary and unspecified malignant neoplasm of axilla and upper limb lymph nodes: Secondary | ICD-10-CM | POA: Diagnosis not present

## 2017-07-03 DIAGNOSIS — T451X5A Adverse effect of antineoplastic and immunosuppressive drugs, initial encounter: Secondary | ICD-10-CM

## 2017-07-03 DIAGNOSIS — Z5111 Encounter for antineoplastic chemotherapy: Secondary | ICD-10-CM

## 2017-07-03 MED ORDER — SODIUM CHLORIDE 0.9 % IV SOLN
20.0000 mg | Freq: Once | INTRAVENOUS | Status: AC
  Administered 2017-07-03: 20 mg via INTRAVENOUS
  Filled 2017-07-03: qty 2

## 2017-07-03 MED ORDER — PEGFILGRASTIM 6 MG/0.6ML ~~LOC~~ PSKT
6.0000 mg | PREFILLED_SYRINGE | Freq: Once | SUBCUTANEOUS | Status: AC
  Administered 2017-07-03: 6 mg via SUBCUTANEOUS
  Filled 2017-07-03: qty 0.6

## 2017-07-03 MED ORDER — HEPARIN SOD (PORK) LOCK FLUSH 100 UNIT/ML IV SOLN
500.0000 [IU] | Freq: Once | INTRAVENOUS | Status: AC | PRN
  Administered 2017-07-03: 500 [IU]
  Filled 2017-07-03: qty 5

## 2017-07-03 MED ORDER — SODIUM CHLORIDE 0.9 % IV SOLN
300.0000 mg | Freq: Once | INTRAVENOUS | Status: AC
  Administered 2017-07-03: 300 mg via INTRAVENOUS
  Filled 2017-07-03: qty 50

## 2017-07-03 MED ORDER — SODIUM CHLORIDE 0.9 % IV SOLN
Freq: Once | INTRAVENOUS | Status: AC
  Administered 2017-07-03: 09:00:00 via INTRAVENOUS
  Filled 2017-07-03: qty 1000

## 2017-07-03 MED ORDER — PALONOSETRON HCL INJECTION 0.25 MG/5ML
0.2500 mg | Freq: Once | INTRAVENOUS | Status: AC
  Administered 2017-07-03: 0.25 mg via INTRAVENOUS
  Filled 2017-07-03: qty 5

## 2017-07-03 MED ORDER — SODIUM CHLORIDE 0.9% FLUSH
10.0000 mL | INTRAVENOUS | Status: DC | PRN
  Administered 2017-07-03: 10 mL
  Filled 2017-07-03: qty 10

## 2017-07-03 MED ORDER — FAMOTIDINE IN NACL 20-0.9 MG/50ML-% IV SOLN
20.0000 mg | Freq: Once | INTRAVENOUS | Status: AC
  Administered 2017-07-03: 20 mg via INTRAVENOUS
  Filled 2017-07-03: qty 50

## 2017-07-03 MED ORDER — DIPHENHYDRAMINE HCL 50 MG/ML IJ SOLN
50.0000 mg | Freq: Once | INTRAMUSCULAR | Status: AC
  Administered 2017-07-03: 50 mg via INTRAVENOUS
  Filled 2017-07-03: qty 1

## 2017-07-03 MED ORDER — SODIUM CHLORIDE 0.9 % IV SOLN
439.5000 mg | Freq: Once | INTRAVENOUS | Status: AC
  Administered 2017-07-03: 440 mg via INTRAVENOUS
  Filled 2017-07-03: qty 44

## 2017-07-04 ENCOUNTER — Encounter: Payer: Self-pay | Admitting: Oncology

## 2017-07-05 ENCOUNTER — Encounter: Payer: Self-pay | Admitting: Oncology

## 2017-07-16 ENCOUNTER — Telehealth: Payer: Self-pay | Admitting: *Deleted

## 2017-07-16 ENCOUNTER — Other Ambulatory Visit: Payer: Self-pay

## 2017-07-16 DIAGNOSIS — Z171 Estrogen receptor negative status [ER-]: Secondary | ICD-10-CM

## 2017-07-16 DIAGNOSIS — C50411 Malignant neoplasm of upper-outer quadrant of right female breast: Secondary | ICD-10-CM

## 2017-07-16 DIAGNOSIS — N6311 Unspecified lump in the right breast, upper outer quadrant: Secondary | ICD-10-CM

## 2017-07-16 NOTE — Telephone Encounter (Signed)
-----   Message from Festus Holts sent at 07/16/2017 11:50 AM EST ----- Regarding: [facility] Patient added for 8:45 a.m. Is patient aware that she has to come earlier for labs?   Verdis Frederickson ----- Message ----- From: Luella Cook, RN Sent: 07/16/2017  10:09 AM To: Festus Holts  The patient left message that she lost [facility] form and wants to get blood drawn at [facility] tom. When she comes. Can you add her on for port labs for tom 2/12.  Her appt for md 9:15 and chemo 9:45.  Let me know and I will call her with the new time for labs thanks

## 2017-07-16 NOTE — Telephone Encounter (Signed)
Patient called and states she lost her [facility] order form and would like to have her labs drawn here tomorrow before her doctor/ inf appointment. Please add appointment and order labs and let her know that it is done.

## 2017-07-16 NOTE — Telephone Encounter (Signed)
Lab appointment added by Burna Sis RN

## 2017-07-16 NOTE — Telephone Encounter (Signed)
Called pt to let her know to come in 8;45 to get port labs and she will be there. She saw the appt on my chart

## 2017-07-17 ENCOUNTER — Encounter: Payer: Self-pay | Admitting: Oncology

## 2017-07-17 ENCOUNTER — Inpatient Hospital Stay: Payer: Managed Care, Other (non HMO) | Attending: Oncology | Admitting: Oncology

## 2017-07-17 ENCOUNTER — Inpatient Hospital Stay: Payer: Managed Care, Other (non HMO)

## 2017-07-17 ENCOUNTER — Other Ambulatory Visit: Payer: Self-pay

## 2017-07-17 ENCOUNTER — Ambulatory Visit: Payer: 59 | Admitting: General Surgery

## 2017-07-17 VITALS — BP 123/89 | HR 64 | Temp 96.3°F | Resp 18 | Wt 204.5 lb

## 2017-07-17 DIAGNOSIS — D6481 Anemia due to antineoplastic chemotherapy: Secondary | ICD-10-CM | POA: Diagnosis not present

## 2017-07-17 DIAGNOSIS — Z171 Estrogen receptor negative status [ER-]: Principal | ICD-10-CM

## 2017-07-17 DIAGNOSIS — C773 Secondary and unspecified malignant neoplasm of axilla and upper limb lymph nodes: Secondary | ICD-10-CM | POA: Insufficient documentation

## 2017-07-17 DIAGNOSIS — C50411 Malignant neoplasm of upper-outer quadrant of right female breast: Secondary | ICD-10-CM

## 2017-07-17 DIAGNOSIS — Z5111 Encounter for antineoplastic chemotherapy: Secondary | ICD-10-CM | POA: Insufficient documentation

## 2017-07-17 DIAGNOSIS — T451X5A Adverse effect of antineoplastic and immunosuppressive drugs, initial encounter: Secondary | ICD-10-CM

## 2017-07-17 DIAGNOSIS — Z808 Family history of malignant neoplasm of other organs or systems: Secondary | ICD-10-CM | POA: Diagnosis not present

## 2017-07-17 DIAGNOSIS — D701 Agranulocytosis secondary to cancer chemotherapy: Secondary | ICD-10-CM | POA: Diagnosis not present

## 2017-07-17 DIAGNOSIS — Z8042 Family history of malignant neoplasm of prostate: Secondary | ICD-10-CM | POA: Diagnosis not present

## 2017-07-17 LAB — COMPREHENSIVE METABOLIC PANEL
ALT: 83 U/L — ABNORMAL HIGH (ref 14–54)
ANION GAP: 6 (ref 5–15)
AST: 37 U/L (ref 15–41)
Albumin: 3.6 g/dL (ref 3.5–5.0)
Alkaline Phosphatase: 111 U/L (ref 38–126)
BILIRUBIN TOTAL: 0.5 mg/dL (ref 0.3–1.2)
BUN: 13 mg/dL (ref 6–20)
CO2: 23 mmol/L (ref 22–32)
Calcium: 8.6 mg/dL — ABNORMAL LOW (ref 8.9–10.3)
Chloride: 109 mmol/L (ref 101–111)
Creatinine, Ser: 0.6 mg/dL (ref 0.44–1.00)
GFR calc Af Amer: >60 mL/min (ref >60)
GFR calc non Af Amer: >60 mL/min (ref >60)
GLUCOSE: 107 mg/dL — AB (ref 65–99)
POTASSIUM: 3.9 mmol/L (ref 3.5–5.1)
SODIUM: 138 mmol/L (ref 135–145)
TOTAL PROTEIN: 6.4 g/dL — AB (ref 6.5–8.1)

## 2017-07-17 LAB — CBC WITH DIFFERENTIAL/PLATELET
BASOS ABS: 0 10*3/uL (ref 0–0.1)
Basophils Relative: 1 %
Eosinophils Absolute: 0 10*3/uL (ref 0–0.7)
Eosinophils Relative: 1 %
HEMATOCRIT: 33.3 % — AB (ref 35.0–47.0)
HEMOGLOBIN: 11 g/dL — AB (ref 12.0–16.0)
LYMPHS PCT: 21 %
Lymphs Abs: 0.7 10*3/uL — ABNORMAL LOW (ref 1.0–3.6)
MCH: 27.8 pg (ref 26.0–34.0)
MCHC: 33.1 g/dL (ref 32.0–36.0)
MCV: 83.8 fL (ref 80.0–100.0)
MONO ABS: 0.3 10*3/uL (ref 0.2–0.9)
Monocytes Relative: 8 %
NEUTROS ABS: 2.5 10*3/uL (ref 1.4–6.5)
Neutrophils Relative %: 69 %
Platelets: 186 10*3/uL (ref 150–440)
RBC: 3.98 MIL/uL (ref 3.80–5.20)
RDW: 15.8 % — AB (ref 11.5–14.5)
WBC: 3.6 10*3/uL (ref 3.6–11.0)

## 2017-07-17 MED ORDER — HEPARIN SOD (PORK) LOCK FLUSH 100 UNIT/ML IV SOLN
500.0000 [IU] | Freq: Once | INTRAVENOUS | Status: AC
  Administered 2017-07-17: 500 [IU] via INTRAVENOUS
  Filled 2017-07-17: qty 5

## 2017-07-17 MED ORDER — FAMOTIDINE IN NACL 20-0.9 MG/50ML-% IV SOLN
20.0000 mg | Freq: Once | INTRAVENOUS | Status: AC
  Administered 2017-07-17: 20 mg via INTRAVENOUS
  Filled 2017-07-17: qty 50

## 2017-07-17 MED ORDER — PALONOSETRON HCL INJECTION 0.25 MG/5ML
0.2500 mg | Freq: Once | INTRAVENOUS | Status: AC
  Administered 2017-07-17: 0.25 mg via INTRAVENOUS
  Filled 2017-07-17: qty 5

## 2017-07-17 MED ORDER — DEXTROSE 5 % IV SOLN: 300.0000 mg | Freq: Once | INTRAVENOUS | Status: DC

## 2017-07-17 MED ORDER — PEGFILGRASTIM 6 MG/0.6ML ~~LOC~~ PSKT
6.0000 mg | PREFILLED_SYRINGE | Freq: Once | SUBCUTANEOUS | Status: AC
  Administered 2017-07-17: 6 mg via SUBCUTANEOUS
  Filled 2017-07-17: qty 0.6

## 2017-07-17 MED ORDER — SODIUM CHLORIDE 0.9% FLUSH
10.0000 mL | Freq: Once | INTRAVENOUS | Status: AC
  Administered 2017-07-17: 10 mL via INTRAVENOUS
  Filled 2017-07-17: qty 10

## 2017-07-17 MED ORDER — SODIUM CHLORIDE 0.9 % IV SOLN
Freq: Once | INTRAVENOUS | Status: AC
  Administered 2017-07-17: 11:00:00 via INTRAVENOUS
  Filled 2017-07-17: qty 1000

## 2017-07-17 MED ORDER — SODIUM CHLORIDE 0.9 % IV SOLN
20.0000 mg | Freq: Once | INTRAVENOUS | Status: AC
  Administered 2017-07-17: 20 mg via INTRAVENOUS
  Filled 2017-07-17: qty 2

## 2017-07-17 MED ORDER — PACLITAXEL CHEMO INJECTION 300 MG/50ML: 300.0000 mg | Freq: Once | INTRAVENOUS | Status: DC

## 2017-07-17 MED ORDER — DIPHENHYDRAMINE HCL 50 MG/ML IJ SOLN
50.0000 mg | Freq: Once | INTRAMUSCULAR | Status: AC
  Administered 2017-07-17: 50 mg via INTRAVENOUS
  Filled 2017-07-17: qty 1

## 2017-07-17 MED ORDER — CARBOPLATIN CHEMO INJECTION 600 MG/60ML
439.5000 mg | Freq: Once | INTRAVENOUS | Status: AC
  Administered 2017-07-17: 440 mg via INTRAVENOUS
  Filled 2017-07-17: qty 44

## 2017-07-17 MED ORDER — SODIUM CHLORIDE 0.9 % IV SOLN
300.0000 mg | Freq: Once | INTRAVENOUS | Status: AC
  Administered 2017-07-17: 300 mg via INTRAVENOUS
  Filled 2017-07-17: qty 50

## 2017-07-17 NOTE — Progress Notes (Signed)
Here for follow up. Overall " doing great " per pt.

## 2017-07-17 NOTE — Progress Notes (Signed)
Hematology/Oncology Consult note [facility]  [phone] [phone]  Patient Care Team: Patient, No Pcp Per as PCP - General (General Practice) Byrnett, Forest Gleason, MD (General Surgery) Gae Dry, MD as Referring Physician (Obstetrics and Gynecology)   Name of the patient: Ashley Molina  [id number]  [birth year]   Date of visit: 07/17/17  Meryle Ready mammary carcinoma of the right [id number] negative, PR 5% positive and HER-2/neu negative  Chief complaint/ Reason for visit-on treatment assessment prior to cycle # 9 of Q2 weekly carbotaxol  Heme/Onc history:1. Patient is a 57 year old postmenopausal female who had a routine screening mammogram on 03/23/2017 which showed a highly suspicious palpable solid mass in the 10 o'clock position. Mass measures at least 5 cm in maximum diameter for right axillary lymph nodes have sonographic features suspicious for metastatic disease. No evidence of malignancy was noted in the left breast. This was followed by an ultrasound of the right breast including the right axilla which confirmed the same findings.Targeted ultrasound is performed, showing a markedly irregular and hypoechoic mass centered at 10 o'clock position approximately 7 cm from the nipple estimated to be at least 5.2 x 4.4 x 3.4 cm. Small areas of vascular flow are detected within the mass. At 10 o'clock position 9 cm from the nipple again noted is a simple cyst measuring approximately 2.4 cm greatest diameter. Skin thickness of the medial and lateral of right breast measures approximately 4 mm.  Ultrasound of the right axilla demonstrates 4 suspicious lymph nodes with hypoechoic thickened cortices. The largest of these lymph nodes measures 2.2 x 1.2 x 1.0 cm and has a cortical thickness of 4-5 mm.  Patient underwent core biopsy of the right breast mass.  2. Biopsy showed 15 mm invasive  carcinoma, grade 3. Right axillary lymph node biopsy was also consistent with metastatic carcinoma. ERnegativePR5% positive,and HER-2negative by FISH  3. Patient is G3P3L3. Menarche at the age of 74. Menopause at 20. No priro h/o breast biopsies. No family h/o breast cancer. She was known to have right breast cyst being followed by Dr. Bary Castilla. She noticed this amss sometime over the summer and feels this has grown since then. She is healthy and has no significant co-morbidities  4. PET/CT scan on 04/04/17 showed:IMPRESSION: 1. Hypermetabolic right breast mass with hypermetabolic lymph nodes in the right axilla and hypermetabolic metastatic disease in both hilar regions and subcarinal mediastinum. 2. No evidence for hypermetabolic metastatic disease in the neck, abdomen or pelvis  5. Patient underwent bronchoscopy and biopsy of hilar LN which was positive for metastatic carcinoma from the breast. She also underwent RUl biopsy and bronchial washings that were negative for malignancy.Baseline CA-15-3 normal at 16.9 And CA-27-29 normal at 15.8.  6.Patient has had significant neutropenia despite dose reduction of carboplatin and adding Neupogen requiring dose interruptions. Plan is therefore to switch her to every 2 weekly cycle of carbotaxol for the remainder of cycles with on pro-Neulasta support   Interval history-reports no symptoms of peripheral neuropathy.  Has mild fatigue but denies other complaints.  Denies any pain  ECOG PS- 0 Pain scale- 0 Opioid associated constipation- no  Review of systems- Review of Systems  Constitutional: Negative for chills, fever, malaise/fatigue and weight loss.  HENT: Negative for congestion, ear discharge and nosebleeds.   Eyes: Negative for blurred vision.  Respiratory: Negative for cough, hemoptysis, sputum production, shortness of breath and wheezing.   Cardiovascular: Negative for chest pain, palpitations, orthopnea and claudication.  Gastrointestinal: Negative for abdominal pain, blood in stool, constipation, diarrhea, heartburn, melena, nausea and vomiting.  Genitourinary: Negative for dysuria, flank pain, frequency, hematuria and urgency.  Musculoskeletal: Negative for back pain, joint pain and myalgias.  Skin: Negative for rash.  Neurological: Negative for dizziness, tingling, focal weakness, seizures, weakness and headaches.  Endo/Heme/Allergies: Does not bruise/bleed easily.  Psychiatric/Behavioral: Negative for depression and suicidal ideas. The patient does not have insomnia.       Allergies  Allergen Reactions  . Penicillins Rash     Past Medical History:  Diagnosis Date  . Anemia   . Breast cancer ([place])   . Breast cyst, right    aspirated by Dr. Bary Castilla  . Hyperlipidemia   . Pre-diabetes      Past Surgical History:  Procedure Laterality Date  . AXILLARY LYMPH NODE BIOPSY Right 04/09/2017   Procedure: AXILLARY LYMPH NODE BIOPSY;  Surgeon: Robert Bellow, MD;  Location: [facility] ORS;  Service: General;  Laterality: Right;  . BREAST CYST ASPIRATION Right 11/2009   Dr. Bary Castilla did FNA  . COLONOSCOPY  2011  . DILATION AND CURETTAGE OF UTERUS     X3  . ENDOBRONCHIAL ULTRASOUND N/A 04/09/2017   Procedure: ENDOBRONCHIAL ULTRASOUND;  Surgeon: Laverle Hobby, MD;  Location: [facility] ORS;  Service: Pulmonary;  Laterality: N/A;  . ENDOMETRIAL ABLATION    . ENDOMETRIAL BIOPSY  09/2009  . PORTACATH PLACEMENT Left 04/09/2017   Procedure: INSERTION PORT-A-CATH;  Surgeon: Robert Bellow, MD;  Location: [facility] ORS;  Service: General;  Laterality: Left;    Social History   Socioeconomic History  . Marital status: Married    Spouse name: Not on file  . Number of children: Not on file  . Years of education: Not on file  . Highest education level: Not on file  Social Needs  . Financial resource strain: Not on file  . Food insecurity - worry: Not on file  . Food insecurity - inability: Not on file  .  Transportation needs - medical: Not on file  . Transportation needs - non-medical: Not on file  Occupational History  . Not on file  Tobacco Use  . Smoking status: Never Smoker  . Smokeless tobacco: Never Used  Substance and Sexual Activity  . Alcohol use: Yes    Alcohol/week: 1.2 oz    Types: 2 Glasses of wine per week  . Drug use: No  . Sexual activity: Not Currently  Other Topics Concern  . Not on file  Social History Narrative  . Not on file    Family History  Problem Relation Age of Onset  . Melanoma Maternal Grandmother 68       currently 86  . Brain cancer Maternal Grandfather 24       unk. type; deceased in 69s  . Melanoma Other 69       mat grandmother's father  . Melanoma Father 57       on head; currently 33  . Prostate cancer Maternal Uncle        3 maternal uncles; dx in 35s  . Breast cancer Other        mat grandfather's sister; dx 4s     Current Outpatient Medications:  .  Biotin 2500 MCG CAPS, Take 5,000 mcg/day by mouth daily., Disp: , Rfl:  .  Cholecalciferol (VITAMIN D3) 10000 units TABS, Take by mouth 1 day or 1 dose., Disp: , Rfl:  .  dexamethasone (DECADRON) 4 MG tablet, Take 2 tablets (8 mg  total) by mouth daily. Start the day after chemotherapy for 2 days., Disp: 30 tablet, Rfl: 1 .  lidocaine-prilocaine (EMLA) cream, Apply to affected area once, Disp: 30 g, Rfl: 3 .  loratadine (CLARITIN) 10 MG tablet, Take 10 mg by mouth daily., Disp: , Rfl:  .  Multiple Vitamins-Minerals (EMERGEN-C IMMUNE PLUS PO), Take 1 packet by mouth 1 day or 1 dose. With energy, Disp: , Rfl:  .  omega-3 acid ethyl esters (LOVAZA) 1 g capsule, Take by mouth 1 day or 1 dose., Disp: , Rfl:  .  HYDROcodone-acetaminophen (NORCO/VICODIN) 5-325 MG tablet, Take by mouth., Disp: , Rfl:  .  LORazepam (ATIVAN) 0.5 MG tablet, Take 1 tablet (0.5 mg total) by mouth every 6 (six) hours as needed (Nausea or vomiting). (Patient not taking: Reported on 07/17/2017), Disp: 30 tablet, Rfl: 0 .   ondansetron (ZOFRAN) 8 MG tablet, Take 1 tablet (8 mg total) by mouth 2 (two) times daily as needed for refractory nausea / vomiting. Start on day 3 after chemo. (Patient not taking: Reported on 07/17/2017), Disp: 30 tablet, Rfl: 1 .  prochlorperazine (COMPAZINE) 10 MG tablet, Take 1 tablet (10 mg total) by mouth every 6 (six) hours as needed (Nausea or vomiting). (Patient not taking: Reported on 07/17/2017), Disp: 30 tablet, Rfl: 1 No current facility-administered medications for this visit.   Facility-Administered Medications Ordered in Other Visits:  .  CARBOplatin (PARAPLATIN) 440 mg in sodium chloride 0.9 % 250 mL chemo infusion, 440 mg, Intravenous, Once, Sindy Guadeloupe, MD .  heparin lock flush 100 unit/mL, 500 Units, Intravenous, Once, Sindy Guadeloupe, MD .  PACLitaxel (TAXOL) 300 mg in sodium chloride 0.9 % 500 mL chemo infusion (> 61m/m2), 300 mg, Intravenous, Once, RSindy Guadeloupe MD, Last Rate: 183 mL/hr at 07/17/17 1154, 300 mg at 07/17/17 1154 .  pegfilgrastim (NEULASTA ONPRO KIT) injection 6 mg, 6 mg, Subcutaneous, Once, RSindy Guadeloupe MD .  Tbo-Filgrastim ([facility] injection 480 mcg, 480 mcg, Subcutaneous, Daily, RSindy Guadeloupe MD, 480 mcg at 05/19/17 1412  Physical exam:  Vitals:   07/17/17 0930  BP: 123/89  Pulse: 64  Resp: 18  Temp: (!) 96.3 F (35.7 C)  TempSrc: Tympanic  Weight: 204 lb 8 oz (92.8 kg)   Physical Exam  Constitutional: She is oriented to person, place, and time and well-developed, well-nourished, and in no distress.  HENT:  Head: Normocephalic and atraumatic.  Eyes: EOM are normal. Pupils are equal, round, and reactive to light.  Neck: Normal range of motion.  Cardiovascular: Normal rate, regular rhythm and normal heart sounds.  Pulmonary/Chest: Effort normal and breath sounds normal.  Abdominal: Soft. Bowel sounds are normal.  Neurological: She is alert and oriented to person, place, and time.  Skin: Skin is warm and dry.  Right breast mass is  still palpable about 3 cm in size unchanged as compared to prior assessment 2 weeks ago  CMP Latest Ref Rng & Units 07/17/2017  Glucose 65 - 99 mg/dL 107(H)  BUN 6 - 20 mg/dL 13  Creatinine 0.44 - 1.00 mg/dL 0.60  Sodium 135 - 145 mmol/L 138  Potassium 3.5 - 5.1 mmol/L 3.9  Chloride 101 - 111 mmol/L 109  CO2 22 - 32 mmol/L 23  Calcium 8.9 - 10.3 mg/dL 8.6(L)  Total Protein 6.5 - 8.1 g/dL 6.4(L)  Total Bilirubin 0.3 - 1.2 mg/dL 0.5  Alkaline Phos 38 - 126 U/L 111  AST 15 - 41 U/L 37  ALT 14 - 54  U/L 83(H)   CBC Latest Ref Rng & Units 07/17/2017  WBC 3.6 - 11.0 K/uL 3.6  Hemoglobin 12.0 - 16.0 g/dL 11.0(L)  Hematocrit 35.0 - 47.0 % 33.3(L)  Platelets 150 - 440 K/uL 186     Assessment and plan- Patient is a 57 y.o. 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  She has been getting every 2-week carbotaxol for the last 3 cycles because of neutropenia.  She will be getting repeat PET/CT scan on 07/20/2017 followed by an ultrasound of her right breast on 07/26/2017  If there is no evidence of metastatic disease and she has good response to her adenopathy in the hilar and subcarinal region-plan is to proceed with 4 cycles of dose dense AC chemotherapy after 2 weeks with on pro-Neulasta support  I would also be touching base with Dr. Murvin Donning from [facility] after her PET/CT scan.  She  will see him on 08/13/2017  I will see her back in 2 weeks time with CBC CMP tentatively for cycle #1 of dose dense AC.  We will order MUGA scan based on her results of her PET scan  Mild chemo-induced anemia: Continue to monitor   Visit Diagnosis 1. Malignant neoplasm of upper-outer quadrant of right breast in female, estrogen receptor negative ([place])   2. Encounter for antineoplastic  chemotherapy   3. Antineoplastic chemotherapy induced anemia      Dr. Randa Evens, MD, MPH [facility] at [facility] Pager- [phone] 07/17/2017 1:27 PM

## 2017-07-20 ENCOUNTER — Other Ambulatory Visit: Payer: Self-pay | Admitting: Oncology

## 2017-07-20 ENCOUNTER — Ambulatory Visit
Admission: RE | Admit: 2017-07-20 | Discharge: 2017-07-20 | Disposition: A | Discharge status: Home / Self Care | Payer: Managed Care, Other (non HMO) | Source: Ambulatory Visit | Attending: Oncology | Admitting: Oncology

## 2017-07-20 DIAGNOSIS — Z171 Estrogen receptor negative status [ER-]: Secondary | ICD-10-CM | POA: Diagnosis present

## 2017-07-20 DIAGNOSIS — C50411 Malignant neoplasm of upper-outer quadrant of right female breast: Secondary | ICD-10-CM | POA: Diagnosis present

## 2017-07-20 LAB — GLUCOSE, CAPILLARY: Glucose-Capillary: 85 mg/dL (ref 65–99)

## 2017-07-20 MED ORDER — FLUDEOXYGLUCOSE F - 18 (FDG) INJECTION
12.8500 | Freq: Once | INTRAVENOUS | Status: AC | PRN
  Administered 2017-07-20: 12.85 via INTRAVENOUS

## 2017-07-20 NOTE — Progress Notes (Signed)
muga

## 2017-07-20 NOTE — Progress Notes (Signed)
You called pt. And spoke to her about results and the need to have muga scan prior to getting [facility]. I have sent message to scheduling to get muga set up and call pt with date

## 2017-07-23 ENCOUNTER — Telehealth: Payer: Self-pay | Admitting: Oncology

## 2017-07-23 NOTE — Telephone Encounter (Signed)
Called patient to confirm MUGA appt. Left message with appt details on patient's home phone and mobile phone.

## 2017-07-24 ENCOUNTER — Ambulatory Visit (INDEPENDENT_AMBULATORY_CARE_PROVIDER_SITE_OTHER): Payer: Managed Care, Other (non HMO) | Admitting: General Surgery

## 2017-07-24 ENCOUNTER — Inpatient Hospital Stay: Payer: Self-pay

## 2017-07-24 ENCOUNTER — Encounter: Payer: Self-pay | Admitting: General Surgery

## 2017-07-24 ENCOUNTER — Other Ambulatory Visit: Payer: Self-pay | Admitting: *Deleted

## 2017-07-24 DIAGNOSIS — Z171 Estrogen receptor negative status [ER-]: Secondary | ICD-10-CM | POA: Diagnosis not present

## 2017-07-24 DIAGNOSIS — N6311 Unspecified lump in the right breast, upper outer quadrant: Secondary | ICD-10-CM

## 2017-07-24 DIAGNOSIS — C50411 Malignant neoplasm of upper-outer quadrant of right female breast: Secondary | ICD-10-CM

## 2017-07-24 DIAGNOSIS — Z9189 Other specified personal risk factors, not elsewhere classified: Secondary | ICD-10-CM

## 2017-07-24 NOTE — Progress Notes (Signed)
Patient ID: Ashley Molina, female   DOB: 1961/02/01, 57 y.o.   MRN: [id number]  Chief Complaint  Patient presents with  . Follow-up    HPI Ashley Molina is a 57 y.o. female here today for her right breast ultrasound. Patient states she is doing well.  HPI  Past Medical History:  Diagnosis Date  . Anemia   . Breast cancer ([place])   . Breast cyst, right    aspirated by Dr. Bary Castilla  . Hyperlipidemia   . Pre-diabetes     Past Surgical History:  Procedure Laterality Date  . AXILLARY LYMPH NODE BIOPSY Right 04/09/2017   Procedure: AXILLARY LYMPH NODE BIOPSY;  Surgeon: Robert Bellow, MD;  Location: [facility] ORS;  Service: General;  Laterality: Right;  . BREAST CYST ASPIRATION Right 11/2009   Dr. Bary Castilla did FNA  . COLONOSCOPY  2011  . DILATION AND CURETTAGE OF UTERUS     X3  . ENDOBRONCHIAL ULTRASOUND N/A 04/09/2017   Procedure: ENDOBRONCHIAL ULTRASOUND;  Surgeon: Laverle Hobby, MD;  Location: [facility] ORS;  Service: Pulmonary;  Laterality: N/A;  . ENDOMETRIAL ABLATION    . ENDOMETRIAL BIOPSY  09/2009  . PORTACATH PLACEMENT Left 04/09/2017   Procedure: INSERTION PORT-A-CATH;  Surgeon: Robert Bellow, MD;  Location: [facility] ORS;  Service: General;  Laterality: Left;    Family History  Problem Relation Age of Onset  . Melanoma Maternal Grandmother 14       currently 14  . Brain cancer Maternal Grandfather 4       unk. type; deceased in 14s  . Melanoma Other 80       mat grandmother's father  . Melanoma Father 16       on head; currently 88  . Prostate cancer Maternal Uncle        3 maternal uncles; dx in 34s  . Breast cancer Other        mat grandfather's sister; dx 19s    Social History Social History   Tobacco Use  . Smoking status: Never Smoker  . Smokeless tobacco: Never Used  Substance Use Topics  . Alcohol use: Yes    Alcohol/week: 1.2 oz    Types: 2 Glasses of wine per week  . Drug use: No    Allergies  Allergen Reactions  . Penicillins  Rash    Current Outpatient Medications  Medication Sig Dispense Refill  . Biotin 2500 MCG CAPS Take 5,000 mcg/day by mouth daily.    . Cholecalciferol (VITAMIN D3) 10000 units TABS Take by mouth 1 day or 1 dose.    . lidocaine-prilocaine (EMLA) cream Apply to affected area once 30 g 3  . loratadine (CLARITIN) 10 MG tablet Take 10 mg by mouth daily.    Marland Kitchen LORazepam (ATIVAN) 0.5 MG tablet Take 1 tablet (0.5 mg total) by mouth every 6 (six) hours as needed (Nausea or vomiting). 30 tablet 0  . Multiple Vitamins-Minerals (EMERGEN-C IMMUNE PLUS PO) Take 1 packet by mouth 1 day or 1 dose. With energy    . omega-3 acid ethyl esters (LOVAZA) 1 g capsule Take by mouth 1 day or 1 dose.    . ondansetron (ZOFRAN) 8 MG tablet Take 1 tablet (8 mg total) by mouth 2 (two) times daily as needed for refractory nausea / vomiting. Start on day 3 after chemo. 30 tablet 1  . prochlorperazine (COMPAZINE) 10 MG tablet Take 1 tablet (10 mg total) by mouth every 6 (six) hours as needed (Nausea or vomiting). 30 tablet  1   No current facility-administered medications for this visit.    Facility-Administered Medications Ordered in Other Visits  Medication Dose Route Frequency Provider Last Rate Last Dose  . Tbo-Filgrastim (GRANIX) injection 480 mcg  480 mcg Subcutaneous Daily Sindy Guadeloupe, MD   480 mcg at 05/19/17 1412    Review of Systems Review of Systems  Constitutional: Negative.   Respiratory: Negative.   Cardiovascular: Negative.     Blood pressure 126/64, pulse 92, resp. rate 14, height 5\' 7"  (1.702 m), weight 202 lb (91.6 kg), last menstrual period 04/04/2012.  Physical Exam Physical Exam  Constitutional: She is oriented to person, place, and time. She appears well-developed and well-nourished.  Cardiovascular: Normal rate, regular rhythm and normal heart sounds.  Pulmonary/Chest: Effort normal and breath sounds normal. Right breast exhibits no inverted nipple, no mass, no nipple discharge, no skin  change and no tenderness. Left breast exhibits no inverted nipple, no mass, no nipple discharge, no skin change and no tenderness.  Lymphadenopathy:    She has no cervical adenopathy.    She has no axillary adenopathy.  Neurological: She is alert and oriented to person, place, and time.  Skin: Skin is warm and dry.    Data Reviewed  PET/CT of July 20, 2017 reviewed:  IMPRESSION: 1. Interval decrease in size of the right breast mass and significant reduction in hypermetabolism. 2. Resolution of right axillary adenopathy and mediastinal and hilar adenopathy. 3. No findings for metastatic disease involving the abdomen/pelvis. 4. Diffuse osseous hypermetabolism likely due to rebound from chemotherapy or marrow stimulating drugs.  Ultrasound examination of the upper outer quadrant of the right breast to reassess tumor response was completed.  A dominant cyst remains in the 10 o'clock position, 10 cm from the nipple.  Immediately adjacent to this at the 1030 o'clock position is the primary tumor mass now measuring 1.7 x 2.7 x 4.0 cm.  This area previously measured 3.7 x 3.7 x 7.2 cm.  This represents an approximately 80% decrease in volume.  A single mildly prominent right axillary node measuring 0.6 x 0.7 cm with internal vascular flow is noted.  BI-RADS-6.  Assessment    Tremendous response to neoadjuvant chemotherapy.  Triple negative disease.    Plan  The patient has had an excellent response with resolution, at least at the level of the PET scan, of known metastatic disease.  While there is soft evidence for resection of ER positive tumors, management of ER negative tumors is a little less clear.  The patient will be meeting with the [facility] consultants next month and will discuss with them their recommendations regarding resection of the primary.  Biopsy of the residual tissue could be completed to determine if viable tumor cells are still present.  The patient is likely candidate  for breast conservation therapy based on her excellent clinical response and her generous breast volume.    HPI, Physical Exam, Assessment and Plan have been scribed under the direction and in the presence of Hervey Ard, MD.  Gaspar Cola, CMA  I have completed the exam and reviewed the above documentation for accuracy and completeness.  I agree with the above.  Haematologist has been used and any errors in dictation or transcription are unintentional.  Hervey Ard, M.D., F.A.C.S.  Forest Gleason Keith Cancio 07/25/2017, 8:06 PM

## 2017-07-25 ENCOUNTER — Telehealth: Payer: Self-pay | Admitting: *Deleted

## 2017-07-25 ENCOUNTER — Ambulatory Visit: Payer: Managed Care, Other (non HMO)

## 2017-07-25 NOTE — Telephone Encounter (Signed)
Called pt to let her know that we are unable to get authorization for her MUGA so we were going to have to reschedule it. In trying to r/s it there is no date available for her to have before her 2/26 chemo visit.  I checked with the staff that gets the authorization and they checked into pt's insurance and echo does not require an [place]. I ordered ECHO per Janese Banks and Pamala Hurry called me with date 2/25 arrive at medical mall 9:45. Pt agreeable to plan and be at the appt for ECHO

## 2017-07-26 ENCOUNTER — Other Ambulatory Visit: Payer: Self-pay | Admitting: Oncology

## 2017-07-30 ENCOUNTER — Ambulatory Visit
Admission: RE | Admit: 2017-07-30 | Discharge: 2017-07-30 | Disposition: A | Discharge status: Home / Self Care | Payer: Managed Care, Other (non HMO) | Source: Ambulatory Visit | Attending: Oncology | Admitting: Oncology

## 2017-07-30 DIAGNOSIS — D649 Anemia, unspecified: Secondary | ICD-10-CM | POA: Insufficient documentation

## 2017-07-30 DIAGNOSIS — I517 Cardiomegaly: Secondary | ICD-10-CM | POA: Insufficient documentation

## 2017-07-30 DIAGNOSIS — Z171 Estrogen receptor negative status [ER-]: Secondary | ICD-10-CM | POA: Insufficient documentation

## 2017-07-30 DIAGNOSIS — R7303 Prediabetes: Secondary | ICD-10-CM | POA: Insufficient documentation

## 2017-07-30 DIAGNOSIS — E785 Hyperlipidemia, unspecified: Secondary | ICD-10-CM | POA: Diagnosis not present

## 2017-07-30 DIAGNOSIS — Z9189 Other specified personal risk factors, not elsewhere classified: Secondary | ICD-10-CM | POA: Diagnosis not present

## 2017-07-30 DIAGNOSIS — C50411 Malignant neoplasm of upper-outer quadrant of right female breast: Secondary | ICD-10-CM | POA: Diagnosis not present

## 2017-07-30 NOTE — Progress Notes (Signed)
*  PRELIMINARY RESULTS* Echocardiogram 2D Echocardiogram has been performed.  [name] 07/30/2017, 10:40 AM

## 2017-07-31 ENCOUNTER — Encounter: Payer: Self-pay | Admitting: Oncology

## 2017-07-31 ENCOUNTER — Inpatient Hospital Stay (HOSPITAL_BASED_OUTPATIENT_CLINIC_OR_DEPARTMENT_OTHER): Payer: Managed Care, Other (non HMO) | Admitting: Oncology

## 2017-07-31 ENCOUNTER — Inpatient Hospital Stay: Payer: Managed Care, Other (non HMO)

## 2017-07-31 VITALS — BP 120/82 | HR 73 | Temp 97.9°F | Resp 18 | Ht 67.0 in | Wt 205.3 lb

## 2017-07-31 DIAGNOSIS — Z5111 Encounter for antineoplastic chemotherapy: Secondary | ICD-10-CM | POA: Diagnosis not present

## 2017-07-31 DIAGNOSIS — Z171 Estrogen receptor negative status [ER-]: Secondary | ICD-10-CM

## 2017-07-31 DIAGNOSIS — C50411 Malignant neoplasm of upper-outer quadrant of right female breast: Secondary | ICD-10-CM

## 2017-07-31 DIAGNOSIS — D6481 Anemia due to antineoplastic chemotherapy: Secondary | ICD-10-CM

## 2017-07-31 DIAGNOSIS — T451X5A Adverse effect of antineoplastic and immunosuppressive drugs, initial encounter: Secondary | ICD-10-CM

## 2017-07-31 MED ORDER — SODIUM CHLORIDE 0.9 % IV SOLN
Freq: Once | INTRAVENOUS | Status: AC
  Administered 2017-07-31: 11:00:00 via INTRAVENOUS
  Filled 2017-07-31: qty 5

## 2017-07-31 MED ORDER — SODIUM CHLORIDE 0.9 % IV SOLN
Freq: Once | INTRAVENOUS | Status: AC
  Administered 2017-07-31: 11:00:00 via INTRAVENOUS
  Filled 2017-07-31: qty 1000

## 2017-07-31 MED ORDER — HEPARIN SOD (PORK) LOCK FLUSH 100 UNIT/ML IV SOLN
500.0000 [IU] | Freq: Once | INTRAVENOUS | Status: AC | PRN
  Administered 2017-07-31: 500 [IU]
  Filled 2017-07-31: qty 5

## 2017-07-31 MED ORDER — PALONOSETRON HCL INJECTION 0.25 MG/5ML
0.2500 mg | Freq: Once | INTRAVENOUS | Status: AC
  Administered 2017-07-31: 0.25 mg via INTRAVENOUS
  Filled 2017-07-31: qty 5

## 2017-07-31 MED ORDER — SODIUM CHLORIDE 0.9 % IV SOLN
600.0000 mg/m2 | Freq: Once | INTRAVENOUS | Status: AC
  Administered 2017-07-31: 1240 mg via INTRAVENOUS
  Filled 2017-07-31: qty 62

## 2017-07-31 MED ORDER — DOXORUBICIN HCL CHEMO IV INJECTION 2 MG/ML
60.0000 mg/m2 | Freq: Once | INTRAVENOUS | Status: AC
  Administered 2017-07-31: 124 mg via INTRAVENOUS
  Filled 2017-07-31: qty 62

## 2017-07-31 MED ORDER — PEGFILGRASTIM 6 MG/0.6ML ~~LOC~~ PSKT
6.0000 mg | PREFILLED_SYRINGE | Freq: Once | SUBCUTANEOUS | Status: AC
  Administered 2017-07-31: 6 mg via SUBCUTANEOUS
  Filled 2017-07-31: qty 0.6

## 2017-07-31 NOTE — Progress Notes (Signed)
Hematology/Oncology Consult note [facility]  [phone] [phone]  Patient Care Team: Patient, No Pcp Per as PCP - General (General Practice) Byrnett, Forest Gleason, MD (General Surgery) Gae Dry, MD as Referring Physician (Obstetrics and Gynecology)   Name of the patient: Ashley Molina  [id number]  04-Jan-1961   Date of visit: 07/31/17  Meryle Ready mammary carcinoma of the right [id number] negative, PR 5% positive and HER-2/neu negative  Chief complaint/ Reason for visit-on treatment assessment prior to cycle #1 of dose dense AC chemotherapy  Heme/Onc history:1. Patient is a 57 year old postmenopausal female who had a routine screening mammogram on 03/23/2017 which showed a highly suspicious palpable solid mass in the 10 o'clock position. Mass measures at least 5 cm in maximum diameter for right axillary lymph nodes have sonographic features suspicious for metastatic disease. No evidence of malignancy was noted in the left breast. This was followed by an ultrasound of the right breast including the right axilla which confirmed the same findings.Targeted ultrasound is performed, showing a markedly irregular and hypoechoic mass centered at 10 o'clock position approximately 7 cm from the nipple estimated to be at least 5.2 x 4.4 x 3.4 cm. Small areas of vascular flow are detected within the mass. At 10 o'clock position 9 cm from the nipple again noted is a simple cyst measuring approximately 2.4 cm greatest diameter. Skin thickness of the medial and lateral of right breast measures approximately 4 mm.  Ultrasound of the right axilla demonstrates 4 suspicious lymph nodes with hypoechoic thickened cortices. The largest of these lymph nodes measures 2.2 x 1.2 x 1.0 cm and has a cortical thickness of 4-5 mm.  Patient underwent core biopsy of the right breast mass.  2. Biopsy showed 15 mm invasive  carcinoma, grade 3. Right axillary lymph node biopsy was also consistent with metastatic carcinoma. ERnegativePR5% positive,and HER-2negative by FISH  3. Patient is G3P3L3. Menarche at the age of 22. Menopause at 47. No priro h/o breast biopsies. No family h/o breast cancer. She was known to have right breast cyst being followed by Dr. Bary Castilla. She noticed this amss sometime over the summer and feels this has grown since then. She is healthy and has no significant co-morbidities  4. PET/CT scan on 04/04/17 showed:IMPRESSION: 1. Hypermetabolic right breast mass with hypermetabolic lymph nodes in the right axilla and hypermetabolic metastatic disease in both hilar regions and subcarinal mediastinum. 2. No evidence for hypermetabolic metastatic disease in the neck, abdomen or pelvis  5. Patient underwent bronchoscopy and biopsy of hilar LN which was positive for metastatic carcinoma from the breast. She also underwent RUl biopsy and bronchial washings that were negative for malignancy.Baseline CA-15-3 normal at 16.9 And CA-27-29 normal at 15.8. Patient seen by Dr. Alinda Money at [facility] and plan was to proceed with carbo/taxol followed by pet and if she has a good response proceed to dd [facility] X4 and possible surgery and RT down the line  6.Patient has had significant neutropenia despite dose reduction of carboplatin and adding Neupogen requiring dose interruptions. Plan was therefore to switch her to every 2 weekly cycle of carbotaxol for the remainder of cycles with on pro-Neulasta support which she completed on 07/17/17  7. PET scan after carbo/taxol showed:Marland Kitchen Interval decrease in size of the right breast mass and significant reduction in hypermetabolism. 2. Resolution of right axillary adenopathy and mediastinal and hilar adenopathy. 3. No findings for metastatic disease involving the abdomen/pelvis. 4. Diffuse osseous hypermetabolism likely due to  rebound from chemotherapy or marrow  stimulating drugs.  8. Right breast US showed: Ultrasound examination of the upper outer quadrant of the right breast to reassess tumor response was completed.  A dominant cyst remains in the 10 o'clock position, 10 cm from the nipple.  Immediately adjacent to this at the 1030 o'clock position is the primary tumor mass now measuring 1.7 x 2.7 x 4.0 cm.  This area previously measured 3.7 x 3.7 x 7.2 cm.  This represents an approximately 80% decrease in volume.  A single mildly prominent right axillary node measuring 0.6 x 0.7 cm with internal vascular flow is noted.  BI-RADS-6.   Interval history- she is doing well. Denies any fatigue, tingling numbness or weakness  ECOG PS- 0 Pain scale- 0 Opioid associated constipation- no  Review of systems- Review of Systems  Constitutional: Negative for chills, fever, malaise/fatigue and weight loss.  HENT: Negative for congestion, ear discharge and nosebleeds.   Eyes: Negative for blurred vision.  Respiratory: Negative for cough, hemoptysis, sputum production, shortness of breath and wheezing.   Cardiovascular: Negative for chest pain, palpitations, orthopnea and claudication.  Gastrointestinal: Negative for abdominal pain, blood in stool, constipation, diarrhea, heartburn, melena, nausea and vomiting.  Genitourinary: Negative for dysuria, flank pain, frequency, hematuria and urgency.  Musculoskeletal: Negative for back pain, joint pain and myalgias.  Skin: Negative for rash.  Neurological: Negative for dizziness, tingling, focal weakness, seizures, weakness and headaches.  Endo/Heme/Allergies: Does not bruise/bleed easily.  Psychiatric/Behavioral: Negative for depression and suicidal ideas. The patient does not have insomnia.        Allergies  Allergen Reactions  . Penicillins Rash     Past Medical History:  Diagnosis Date  . Anemia   . Breast cancer ([place])   . Breast cyst, right    aspirated by Dr. Bary Castilla  . Hyperlipidemia   .  Pre-diabetes      Past Surgical History:  Procedure Laterality Date  . AXILLARY LYMPH NODE BIOPSY Right 04/09/2017   Procedure: AXILLARY LYMPH NODE BIOPSY;  Surgeon: Robert Bellow, MD;  Location: [facility] ORS;  Service: General;  Laterality: Right;  . BREAST CYST ASPIRATION Right 11/2009   Dr. Bary Castilla did FNA  . COLONOSCOPY  2011  . DILATION AND CURETTAGE OF UTERUS     X3  . ENDOBRONCHIAL ULTRASOUND N/A 04/09/2017   Procedure: ENDOBRONCHIAL ULTRASOUND;  Surgeon: Laverle Hobby, MD;  Location: [facility] ORS;  Service: Pulmonary;  Laterality: N/A;  . ENDOMETRIAL ABLATION    . ENDOMETRIAL BIOPSY  09/2009  . PORTACATH PLACEMENT Left 04/09/2017   Procedure: INSERTION PORT-A-CATH;  Surgeon: Robert Bellow, MD;  Location: [facility] ORS;  Service: General;  Laterality: Left;    Social History   Socioeconomic History  . Marital status: Married    Spouse name: Not on file  . Number of children: Not on file  . Years of education: Not on file  . Highest education level: Not on file  Social Needs  . Financial resource strain: Not on file  . Food insecurity - worry: Not on file  . Food insecurity - inability: Not on file  . Transportation needs - medical: Not on file  . Transportation needs - non-medical: Not on file  Occupational History  . Not on file  Tobacco Use  . Smoking status: Never Smoker  . Smokeless tobacco: Never Used  Substance and Sexual Activity  . Alcohol use: Yes    Alcohol/week: 1.2 oz    Types: 2 Glasses of wine  per week  . Drug use: No  . Sexual activity: Not Currently  Other Topics Concern  . Not on file  Social History Narrative  . Not on file    Family History  Problem Relation Age of Onset  . Melanoma Maternal Grandmother 55       currently 55  . Brain cancer Maternal Grandfather 94       unk. type; deceased in 23s  . Melanoma Other 24       mat grandmother's father  . Melanoma Father 61       on head; currently 44  . Prostate cancer Maternal  Uncle        3 maternal uncles; dx in 40s  . Breast cancer Other        mat grandfather's sister; dx 31s     Current Outpatient Medications:  .  Biotin 2500 MCG CAPS, Take 5,000 mcg/day by mouth daily., Disp: , Rfl:  .  Cholecalciferol (VITAMIN D3) 10000 units TABS, Take by mouth 1 day or 1 dose., Disp: , Rfl:  .  loratadine (CLARITIN) 10 MG tablet, Take 10 mg by mouth daily., Disp: , Rfl:  .  Multiple Vitamins-Minerals (CENTRUM SILVER ADULT 50+) TABS, Take 1 tablet by mouth daily., Disp: , Rfl:  .  Multiple Vitamins-Minerals (EMERGEN-C IMMUNE PLUS PO), Take 1 packet by mouth 1 day or 1 dose. With energy, Disp: , Rfl:  .  omega-3 acid ethyl esters (LOVAZA) 1 g capsule, Take by mouth 1 day or 1 dose., Disp: , Rfl:  No current facility-administered medications for this visit.   Facility-Administered Medications Ordered in Other Visits:  .  Tbo-Filgrastim (GRANIX) injection 480 mcg, 480 mcg, Subcutaneous, Daily, Sindy Guadeloupe, MD, 480 mcg at 05/19/17 1412  Physical exam:  Vitals:   07/31/17 0916  BP: 120/82  Pulse: 73  Resp: 18  Temp: 97.9 F (36.6 C)  TempSrc: Tympanic  Weight: 205 lb 4.8 oz (93.1 kg)  Height: 5' 7" (1.702 m)   Physical Exam  Constitutional: She is oriented to person, place, and time and well-developed, well-nourished, and in no distress.  HENT:  Head: Normocephalic and atraumatic.  Eyes: EOM are normal. Pupils are equal, round, and reactive to light.  Neck: Normal range of motion.  Cardiovascular: Normal rate, regular rhythm and normal heart sounds.  Pulmonary/Chest: Effort normal and breath sounds normal.  Abdominal: Soft. Bowel sounds are normal.  Neurological: She is alert and oriented to person, place, and time.  Skin: Skin is warm and dry.   palpable right breast mass about 4 cm in size. No palpable right axillary adenopathy  CMP Latest Ref Rng & Units 07/17/2017  Glucose 65 - 99 mg/dL 107(H)  BUN 6 - 20 mg/dL 13  Creatinine 0.44 - 1.00 mg/dL 0.60    Sodium 135 - 145 mmol/L 138  Potassium 3.5 - 5.1 mmol/L 3.9  Chloride 101 - 111 mmol/L 109  CO2 22 - 32 mmol/L 23  Calcium 8.9 - 10.3 mg/dL 8.6(L)  Total Protein 6.5 - 8.1 g/dL 6.4(L)  Total Bilirubin 0.3 - 1.2 mg/dL 0.5  Alkaline Phos 38 - 126 U/L 111  AST 15 - 41 U/L 37  ALT 14 - 54 U/L 83(H)   CBC Latest Ref Rng & Units 07/17/2017  WBC 3.6 - 11.0 K/uL 3.6  Hemoglobin 12.0 - 16.0 g/dL 11.0(L)  Hematocrit 35.0 - 47.0 % 33.3(L)  Platelets 150 - 440 K/uL 186    No images are attached to the  encounter.  Nm Pet Image Restag (ps) Skull Base To Thigh  Result Date: 07/20/2017 CLINICAL DATA:  Subsequent treatment strategy for breast cancer. EXAM: NUCLEAR MEDICINE PET SKULL BASE TO THIGH TECHNIQUE: 12.85 mCi F-18 FDG was injected intravenously. Full-ring PET imaging was performed from the skull base to thigh after the radiotracer. CT data was obtained and used for attenuation correction and anatomic localization. FASTING BLOOD GLUCOSE:  Value: 85 mg/dl COMPARISON:  04/04/2017 FINDINGS: NECK: No hypermetabolic lymph nodes in the neck. CHEST: Residual right breast lesion and adjacent cystic area. The lesion measures approximately 2.6 x 2.4 cm and previously measured 3.7 x 3.3 cm. SUV max is 2.1 and was previously 7.2. Resolution of right axillary lymphadenopathy. Resolution of enlarged and hypermetabolic mediastinal and hilar lymph nodes. No pulmonary nodules to suggest pulmonary metastatic disease. ABDOMEN/PELVIS: No abnormal hypermetabolic activity within the liver, pancreas, adrenal glands, or spleen. No hypermetabolic lymph nodes in the abdomen or pelvis. SKELETON: Diffuse hypermetabolism throughout the axial and appendicular skeleton consistent with rebound from chemotherapy or marrow stimulating drugs. No obvious bone lesions on the CT scan. IMPRESSION: 1. Interval decrease in size of the right breast mass and significant reduction in hypermetabolism. 2. Resolution of right axillary adenopathy and  mediastinal and hilar adenopathy. 3. No findings for metastatic disease involving the abdomen/pelvis. 4. Diffuse osseous hypermetabolism likely due to rebound from chemotherapy or marrow stimulating drugs. Electronically Signed   By: Marijo Sanes M.D.   On: 07/20/2017 11:14   US Breast Complete Uni Right Inc Axilla  Result Date: 07/25/2017 Ultrasound examination of the upper outer quadrant of the right breast to reassess tumor response was completed.  A dominant cyst remains in the 10 o'clock position, 10 cm from the nipple.  Immediately adjacent to this at the 1030 o'clock position is the primary tumor mass now measuring 1.7 x 2.7 x 4.0 cm.  This area previously measured 3.7 x 3.7 x 7.2 cm.  This represents an approximately 80% decrease in volume.  A single mildly prominent right axillary node measuring 0.6 x 0.7 cm with internal vascular flow is noted.  BI-RADS-6.     Assessment and plan- Patient is a 57 y.o. 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  This area previously measured 3.7 x 3.7 x 7.2 cm.  This represents an approximately 80% decrease in volume. PET CT scan also shows resolution of hypermetabolic axillary, hilar and mediastinal adenopathy. I have reviewed the PET/CT images independently and discussed these findings with the patient. Baseline ECHO shows normal EF  Proceed with cycle 1 of dose dense AC chemotherapy. I have reviewed outside [facility] labs done yesterday and counts are ok to proceed with chemotherapy. She will see Dr. Alinda Money on 08/13/17. I will see her on 08/14/17 with cbc, cmp for cycle 2 of dose dense AC chemotherapy with onpro neulasta  Discussed the possibility of surgery, adjuvant  RT if she has no metastatic disease on PET after 4 cycles of dose dense AC. If she has residual breast cancer at the time fo surgery, I will consider adjuvant xeloda for her.   Chemo induced anemia- hb stable around 10. Continue to monitor     Visit Diagnosis 1. Malignant neoplasm of  upper-outer quadrant of right breast in female, estrogen receptor negative ([place])   2. Anemia due to antineoplastic chemotherapy   3. Encounter for antineoplastic chemotherapy      Dr. Randa Evens, MD, MPH [facility] Pager- [phone] 07/31/2017 12:38 PM

## 2017-07-31 NOTE — Progress Notes (Signed)
Pt doing well no c/o. eaitng well

## 2017-08-14 ENCOUNTER — Inpatient Hospital Stay: Payer: Managed Care, Other (non HMO) | Attending: Oncology | Admitting: Oncology

## 2017-08-14 ENCOUNTER — Inpatient Hospital Stay: Payer: Managed Care, Other (non HMO)

## 2017-08-14 ENCOUNTER — Encounter: Payer: Self-pay | Admitting: Oncology

## 2017-08-14 ENCOUNTER — Other Ambulatory Visit: Payer: Self-pay

## 2017-08-14 VITALS — BP 108/71 | HR 80 | Temp 98.2°F | Wt 202.8 lb

## 2017-08-14 DIAGNOSIS — Z171 Estrogen receptor negative status [ER-]: Secondary | ICD-10-CM | POA: Diagnosis not present

## 2017-08-14 DIAGNOSIS — C773 Secondary and unspecified malignant neoplasm of axilla and upper limb lymph nodes: Secondary | ICD-10-CM

## 2017-08-14 DIAGNOSIS — C50411 Malignant neoplasm of upper-outer quadrant of right female breast: Secondary | ICD-10-CM | POA: Diagnosis present

## 2017-08-14 DIAGNOSIS — Z5189 Encounter for other specified aftercare: Secondary | ICD-10-CM | POA: Insufficient documentation

## 2017-08-14 DIAGNOSIS — C771 Secondary and unspecified malignant neoplasm of intrathoracic lymph nodes: Secondary | ICD-10-CM | POA: Diagnosis not present

## 2017-08-14 DIAGNOSIS — Z5111 Encounter for antineoplastic chemotherapy: Secondary | ICD-10-CM | POA: Insufficient documentation

## 2017-08-14 DIAGNOSIS — D701 Agranulocytosis secondary to cancer chemotherapy: Secondary | ICD-10-CM | POA: Diagnosis not present

## 2017-08-14 DIAGNOSIS — T451X5A Adverse effect of antineoplastic and immunosuppressive drugs, initial encounter: Secondary | ICD-10-CM

## 2017-08-14 NOTE — Progress Notes (Signed)
Hematology/Oncology Consult note [facility]  [phone] [phone]  Patient Care Team: Patient, No Pcp Per as PCP - General (General Practice) Byrnett, Forest Gleason, MD (General Surgery) Gae Dry, MD as Referring Physician (Obstetrics and Gynecology)   Name of the patient: Ashley Molina  [id number]  [birth year]   Date of visit: 08/14/17  Meryle Ready mammary carcinoma of the right [id number] negative, PR 5% positive and HER-2/neu negative  Chief complaint/ Reason for visit-on treatment assessment prior to cycle #2 of dose dense AC chemotherapy  Heme/Onc history:1. Patient is a 57 year old postmenopausal female who had a routine screening mammogram on 03/23/2017 which showed a highly suspicious palpable solid mass in the 10 o'clock position. Mass measures at least 5 cm in maximum diameter for right axillary lymph nodes have sonographic features suspicious for metastatic disease. No evidence of malignancy was noted in the left breast. This was followed by an ultrasound of the right breast including the right axilla which confirmed the same findings.Targeted ultrasound is performed, showing a markedly irregular and hypoechoic mass centered at 10 o'clock position approximately 7 cm from the nipple estimated to be at least 5.2 x 4.4 x 3.4 cm. Small areas of vascular flow are detected within the mass. At 10 o'clock position 9 cm from the nipple again noted is a simple cyst measuring approximately 2.4 cm greatest diameter. Skin thickness of the medial and lateral of right breast measures approximately 4 mm.  Ultrasound of the right axilla demonstrates 4 suspicious lymph nodes with hypoechoic thickened cortices. The largest of these lymph nodes measures 2.2 x 1.2 x 1.0 cm and has a cortical thickness of 4-5 mm.  Patient underwent core biopsy of the right breast mass.  2. Biopsy showed 15 mm invasive  carcinoma, grade 3. Right axillary lymph node biopsy was also consistent with metastatic carcinoma. ERnegativePR5% positive,and HER-2negative by FISH  3. Patient is G3P3L3. Menarche at the age of 31. Menopause at 89. No priro h/o breast biopsies. No family h/o breast cancer. She was known to have right breast cyst being followed by Dr. Bary Castilla. She noticed this amss sometime over the summer and feels this has grown since then. She is healthy and has no significant co-morbidities  4. PET/CT scan on 04/04/17 showed:IMPRESSION: 1. Hypermetabolic right breast mass with hypermetabolic lymph nodes in the right axilla and hypermetabolic metastatic disease in both hilar regions and subcarinal mediastinum. 2. No evidence for hypermetabolic metastatic disease in the neck, abdomen or pelvis  5. Patient underwent bronchoscopy and biopsy of hilar LN which was positive for metastatic carcinoma from the breast. She also underwent RUl biopsy and bronchial washings that were negative for malignancy.Baseline CA-15-3 normal at 16.9 And CA-27-29 normal at 15.8. Patient seen by Dr. Alinda Money at [facility] and plan was to proceed with carbo/taxol followed by pet and if she has a good response proceed to dd [facility] X4 and possible surgery and RT down the line  6.Patient has had significant neutropenia despite dose reduction of carboplatin and adding Neupogen requiring dose interruptions. Plan was therefore to switch her to every 2 weekly cycle of carbotaxol for the remainder of cycles with on pro-Neulasta support which she completed on 07/17/17  7. PET scan after carbo/taxol showed:Marland Kitchen Interval decrease in size of the right breast mass and significant reduction in hypermetabolism. 2. Resolution of right axillary adenopathy and mediastinal and hilar adenopathy. 3. No findings for metastatic disease involving the abdomen/pelvis. 4. Diffuse osseous hypermetabolism likely due to  rebound from chemotherapy or marrow  stimulating drugs.  8. Right breast US showed: Ultrasound examination of the upper outer quadrant of the right breast to reassess tumor response was completed. A dominant cyst remains in the 10 o'clock position, 10 cm from the nipple. Immediately adjacent to this at the 1030 o'clock position is the primary tumor mass now measuring 1.7 x 2.7 x 4.0 cm. This area previously measured 3.7 x 3.7 x 7.2 cm. This represents an approximately 80% decrease in volume. A single mildly prominent right axillary node measuring 0.6 x 0.7 cm with internal vascular flow is noted. BI-RADS-6.Seen by Dr. Alinda Money on 08/13/17 who recommends continuing 4 cycles of dd AC followed by PET and then possible surgery/ RT    Interval history- doing well and denies any complaints today. No nausea/ vomiting, fatigue or fever  ECOG PS- 0 Pain scale- 0   Review of systems- Review of Systems  Constitutional: Negative for chills, fever, malaise/fatigue and weight loss.  HENT: Negative for congestion, ear discharge and nosebleeds.   Eyes: Negative for blurred vision.  Respiratory: Negative for cough, hemoptysis, sputum production, shortness of breath and wheezing.   Cardiovascular: Negative for chest pain, palpitations, orthopnea and claudication.  Gastrointestinal: Negative for abdominal pain, blood in stool, constipation, diarrhea, heartburn, melena, nausea and vomiting.  Genitourinary: Negative for dysuria, flank pain, frequency, hematuria and urgency.  Musculoskeletal: Negative for back pain, joint pain and myalgias.  Skin: Negative for rash.  Neurological: Negative for dizziness, tingling, focal weakness, seizures, weakness and headaches.  Endo/Heme/Allergies: Does not bruise/bleed easily.  Psychiatric/Behavioral: Negative for depression and suicidal ideas. The patient does not have insomnia.       Allergies  Allergen Reactions  . Penicillins Rash     Past Medical History:  Diagnosis Date  . Anemia   . Breast  cancer ([place])   . Breast cyst, right    aspirated by Dr. Bary Castilla  . Hyperlipidemia   . Pre-diabetes      Past Surgical History:  Procedure Laterality Date  . AXILLARY LYMPH NODE BIOPSY Right 04/09/2017   Procedure: AXILLARY LYMPH NODE BIOPSY;  Surgeon: Robert Bellow, MD;  Location: [facility] ORS;  Service: General;  Laterality: Right;  . BREAST CYST ASPIRATION Right 11/2009   Dr. Bary Castilla did FNA  . COLONOSCOPY  2011  . DILATION AND CURETTAGE OF UTERUS     X3  . ENDOBRONCHIAL ULTRASOUND N/A 04/09/2017   Procedure: ENDOBRONCHIAL ULTRASOUND;  Surgeon: Laverle Hobby, MD;  Location: [facility] ORS;  Service: Pulmonary;  Laterality: N/A;  . ENDOMETRIAL ABLATION    . ENDOMETRIAL BIOPSY  09/2009  . PORTACATH PLACEMENT Left 04/09/2017   Procedure: INSERTION PORT-A-CATH;  Surgeon: Robert Bellow, MD;  Location: [facility] ORS;  Service: General;  Laterality: Left;    Social History   Socioeconomic History  . Marital status: Married    Spouse name: Not on file  . Number of children: Not on file  . Years of education: Not on file  . Highest education level: Not on file  Social Needs  . Financial resource strain: Not on file  . Food insecurity - worry: Not on file  . Food insecurity - inability: Not on file  . Transportation needs - medical: Not on file  . Transportation needs - non-medical: Not on file  Occupational History  . Not on file  Tobacco Use  . Smoking status: Never Smoker  . Smokeless tobacco: Never Used  Substance and Sexual Activity  . Alcohol use: Yes  Alcohol/week: 1.2 oz    Types: 2 Glasses of wine per week  . Drug use: No  . Sexual activity: Not Currently  Other Topics Concern  . Not on file  Social History Narrative  . Not on file    Family History  Problem Relation Age of Onset  . Melanoma Maternal Grandmother 49       currently 46  . Brain cancer Maternal Grandfather 13       unk. type; deceased in 74s  . Melanoma Other 31       mat grandmother's  father  . Melanoma Father 87       on head; currently 60  . Prostate cancer Maternal Uncle        3 maternal uncles; dx in 35s  . Breast cancer Other        mat grandfather's sister; dx 81s     Current Outpatient Medications:  .  Biotin 2500 MCG CAPS, Take 5,000 mcg/day by mouth daily., Disp: , Rfl:  .  Cholecalciferol (VITAMIN D3) 10000 units TABS, Take by mouth 1 day or 1 dose., Disp: , Rfl:  .  loratadine (CLARITIN) 10 MG tablet, Take 10 mg by mouth daily., Disp: , Rfl:  .  Multiple Vitamins-Minerals (CENTRUM SILVER ADULT 50+) TABS, Take 1 tablet by mouth daily., Disp: , Rfl:  .  Multiple Vitamins-Minerals (EMERGEN-C IMMUNE PLUS PO), Take 1 packet by mouth 1 day or 1 dose. With energy, Disp: , Rfl:  .  omega-3 acid ethyl esters (LOVAZA) 1 g capsule, Take by mouth 1 day or 1 dose., Disp: , Rfl:  No current facility-administered medications for this visit.   Facility-Administered Medications Ordered in Other Visits:  .  Tbo-Filgrastim (GRANIX) injection 480 mcg, 480 mcg, Subcutaneous, Daily, Sindy Guadeloupe, MD, 480 mcg at 05/19/17 1412  Physical exam:  Vitals:   08/14/17 0923  BP: 108/71  Pulse: 80  Temp: 98.2 F (36.8 C)  TempSrc: Tympanic  Weight: 202 lb 12.8 oz (92 kg)   Physical Exam  Constitutional: She is oriented to person, place, and time and well-developed, well-nourished, and in no distress.  HENT:  Head: Normocephalic and atraumatic.  Eyes: EOM are normal. Pupils are equal, round, and reactive to light.  Neck: Normal range of motion.  Cardiovascular: Normal rate, regular rhythm and normal heart sounds.  Pulmonary/Chest: Effort normal and breath sounds normal.  Abdominal: Soft. Bowel sounds are normal.  Neurological: She is alert and oriented to person, place, and time.  Skin: Skin is warm and dry.     CMP Latest Ref Rng & Units 07/17/2017  Glucose 65 - 99 mg/dL 107(H)  BUN 6 - 20 mg/dL 13  Creatinine 0.44 - 1.00 mg/dL 0.60  Sodium 135 - 145 mmol/L 138    Potassium 3.5 - 5.1 mmol/L 3.9  Chloride 101 - 111 mmol/L 109  CO2 22 - 32 mmol/L 23  Calcium 8.9 - 10.3 mg/dL 8.6(L)  Total Protein 6.5 - 8.1 g/dL 6.4(L)  Total Bilirubin 0.3 - 1.2 mg/dL 0.5  Alkaline Phos 38 - 126 U/L 111  AST 15 - 41 U/L 37  ALT 14 - 54 U/L 83(H)   CBC Latest Ref Rng & Units 07/17/2017  WBC 3.6 - 11.0 K/uL 3.6  Hemoglobin 12.0 - 16.0 g/dL 11.0(L)  Hematocrit 35.0 - 47.0 % 33.3(L)  Platelets 150 - 440 K/uL 186    No images are attached to the encounter.  Nm Pet Image Restag (ps) Skull Base To Thigh  Result Date: 07/20/2017 CLINICAL DATA:  Subsequent treatment strategy for breast cancer. EXAM: NUCLEAR MEDICINE PET SKULL BASE TO THIGH TECHNIQUE: 12.85 mCi F-18 FDG was injected intravenously. Full-ring PET imaging was performed from the skull base to thigh after the radiotracer. CT data was obtained and used for attenuation correction and anatomic localization. FASTING BLOOD GLUCOSE:  Value: 85 mg/dl COMPARISON:  04/04/2017 FINDINGS: NECK: No hypermetabolic lymph nodes in the neck. CHEST: Residual right breast lesion and adjacent cystic area. The lesion measures approximately 2.6 x 2.4 cm and previously measured 3.7 x 3.3 cm. SUV max is 2.1 and was previously 7.2. Resolution of right axillary lymphadenopathy. Resolution of enlarged and hypermetabolic mediastinal and hilar lymph nodes. No pulmonary nodules to suggest pulmonary metastatic disease. ABDOMEN/PELVIS: No abnormal hypermetabolic activity within the liver, pancreas, adrenal glands, or spleen. No hypermetabolic lymph nodes in the abdomen or pelvis. SKELETON: Diffuse hypermetabolism throughout the axial and appendicular skeleton consistent with rebound from chemotherapy or marrow stimulating drugs. No obvious bone lesions on the CT scan. IMPRESSION: 1. Interval decrease in size of the right breast mass and significant reduction in hypermetabolism. 2. Resolution of right axillary adenopathy and mediastinal and hilar  adenopathy. 3. No findings for metastatic disease involving the abdomen/pelvis. 4. Diffuse osseous hypermetabolism likely due to rebound from chemotherapy or marrow stimulating drugs. Electronically Signed   By: Marijo Sanes M.D.   On: 07/20/2017 11:14   US Breast Complete Uni Right Inc Axilla  Result Date: 07/25/2017 Ultrasound examination of the upper outer quadrant of the right breast to reassess tumor response was completed.  A dominant cyst remains in the 10 o'clock position, 10 cm from the nipple.  Immediately adjacent to this at the 1030 o'clock position is the primary tumor mass now measuring 1.7 x 2.7 x 4.0 cm.  This area previously measured 3.7 x 3.7 x 7.2 cm.  This represents an approximately 80% decrease in volume.  A single mildly prominent right axillary node measuring 0.6 x 0.7 cm with internal vascular flow is noted.  BI-RADS-6.     Assessment and plan- Patient is a 57 y.o. 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 Mild thrombocytopenia with platelet count of 110. Therefore no chemo today. Neutropenic precautions reviewed. rtc in 1 week with cbc to get cycle 2 of dd AC. She did receive onpro neulasta without any leaks. I will therefore not give her neupogen. Wait for counts to recover.    Will plan for repeat PET/CT around 09/24/17. Patient seen by Dr. Alinda Money who plans to see her after PET and get Rad onc and surgery opinion at [facility] on 09/26/17     Visit Diagnosis 1. Malignant neoplasm of upper-outer quadrant of right breast in female, estrogen receptor negative ([place])   2. Chemotherapy induced neutropenia ([facility])      Dr. Randa Evens, MD, MPH [facility] Pager- [phone] 08/14/2017  9:55  AM

## 2017-08-21 ENCOUNTER — Inpatient Hospital Stay: Payer: Managed Care, Other (non HMO)

## 2017-08-21 ENCOUNTER — Inpatient Hospital Stay (HOSPITAL_BASED_OUTPATIENT_CLINIC_OR_DEPARTMENT_OTHER): Payer: Managed Care, Other (non HMO) | Admitting: Oncology

## 2017-08-21 ENCOUNTER — Encounter: Payer: Self-pay | Admitting: Oncology

## 2017-08-21 VITALS — BP 139/75 | HR 85 | Temp 97.3°F | Resp 18 | Ht 67.0 in | Wt 205.7 lb

## 2017-08-21 DIAGNOSIS — C7800 Secondary malignant neoplasm of unspecified lung: Principal | ICD-10-CM

## 2017-08-21 DIAGNOSIS — Z5111 Encounter for antineoplastic chemotherapy: Secondary | ICD-10-CM | POA: Diagnosis not present

## 2017-08-21 DIAGNOSIS — C771 Secondary and unspecified malignant neoplasm of intrathoracic lymph nodes: Secondary | ICD-10-CM

## 2017-08-21 DIAGNOSIS — Z171 Estrogen receptor negative status [ER-]: Principal | ICD-10-CM

## 2017-08-21 DIAGNOSIS — C50411 Malignant neoplasm of upper-outer quadrant of right female breast: Secondary | ICD-10-CM

## 2017-08-21 DIAGNOSIS — C50911 Malignant neoplasm of unspecified site of right female breast: Secondary | ICD-10-CM

## 2017-08-21 MED ORDER — SODIUM CHLORIDE 0.9 % IV SOLN
600.0000 mg/m2 | Freq: Once | INTRAVENOUS | Status: AC
  Administered 2017-08-21: 1240 mg via INTRAVENOUS
  Filled 2017-08-21: qty 50

## 2017-08-21 MED ORDER — DOXORUBICIN HCL CHEMO IV INJECTION 2 MG/ML
60.0000 mg/m2 | Freq: Once | INTRAVENOUS | Status: AC
  Administered 2017-08-21: 124 mg via INTRAVENOUS
  Filled 2017-08-21: qty 62

## 2017-08-21 MED ORDER — PEGFILGRASTIM 6 MG/0.6ML ~~LOC~~ PSKT
6.0000 mg | PREFILLED_SYRINGE | Freq: Once | SUBCUTANEOUS | Status: AC
  Administered 2017-08-21: 6 mg via SUBCUTANEOUS
  Filled 2017-08-21: qty 0.6

## 2017-08-21 MED ORDER — HEPARIN SOD (PORK) LOCK FLUSH 100 UNIT/ML IV SOLN
500.0000 [IU] | Freq: Once | INTRAVENOUS | Status: AC | PRN
  Administered 2017-08-21: 500 [IU]
  Filled 2017-08-21: qty 5

## 2017-08-21 MED ORDER — PALONOSETRON HCL INJECTION 0.25 MG/5ML
0.2500 mg | Freq: Once | INTRAVENOUS | Status: AC
  Administered 2017-08-21: 0.25 mg via INTRAVENOUS
  Filled 2017-08-21: qty 5

## 2017-08-21 MED ORDER — SODIUM CHLORIDE 0.9 % IV SOLN
Freq: Once | INTRAVENOUS | Status: AC
  Administered 2017-08-21: 11:00:00 via INTRAVENOUS
  Filled 2017-08-21: qty 5

## 2017-08-21 MED ORDER — SODIUM CHLORIDE 0.9 % IV SOLN
Freq: Once | INTRAVENOUS | Status: AC
Start: 2017-08-21 — End: 2017-08-21
  Administered 2017-08-21: 11:00:00 via INTRAVENOUS
  Filled 2017-08-21: qty 1000

## 2017-08-21 NOTE — Progress Notes (Signed)
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 Patient is a 57 year old postmenopausal female who had a routine screening mammogram on 03/23/2017 which showed a highly suspicious palpable solid mass in the 10 o'clock position. Mass measures at least 5 cm in maximum diameter for right axillary lymph nodes have sonographic features suspicious for metastatic disease. No evidence of malignancy was noted in the left breast. This was followed by an ultrasound of the right breast including the right axilla which confirmed the same findings.Targeted ultrasound is performed, showing a markedly irregular and hypoechoic mass centered at 10 o'clock position approximately 7 cm from the nipple estimated to be at least 5.2 x 4.4 x 3.4 cm. Small areas of vascular flow are detected within the mass. At 10 o'clock position 9 cm from the nipple again noted is a simple cyst measuring approximately 2.4 cm greatest diameter. Skin thickness of the medial and lateral of right breast measures approximately 4 mm.  Ultrasound of the right axilla demonstrates 4 suspicious lymph nodes with hypoechoic thickened cortices. The largest of these lymph nodes measures 2.2 x 1.2 x 1.0 cm and has a cortical thickness of 4-5 mm.  Patient underwent core biopsy of the right breast mass.  2. Biopsy showed 15 mm invasive  carcinoma, grade 3. Right axillary lymph node biopsy was also consistent with metastatic carcinoma. ERnegativePR5% positive,and HER-2negative by FISH  3. Patient is G3P3L3. Menarche at the age of 89. Menopause at 29. No priro h/o breast biopsies. No family h/o breast cancer. She was known to have right breast cyst being followed by Dr. Bary Castilla. She noticed this amss sometime over the summer and feels this has grown since then. She is healthy and has no significant co-morbidities  4. PET/CT scan on 04/04/17 showed:IMPRESSION: 1. Hypermetabolic right breast mass with hypermetabolic lymph nodes in the right axilla and hypermetabolic metastatic disease in both hilar regions and subcarinal mediastinum. 2. No evidence for hypermetabolic metastatic disease in the neck, abdomen or pelvis  5. Patient underwent bronchoscopy and biopsy of hilar LN which was positive for metastatic carcinoma from the breast. She also underwent RUl biopsy and bronchial washings that were negative for malignancy.Baseline CA-15-3 normal at 16.9 And CA-27-29 normal at 15.8. Patient seen by Dr. Alinda Money at [facility] and plan was to proceed with carbo/taxol followed by pet and if she has a good response proceed to dd [facility] X4 and possible surgery and RT down the line  6.Patient has had significant neutropenia despite dose reduction of carboplatin and adding Neupogen requiring dose interruptions. Plan wastherefore to switch her to every 2 weekly cycle of carbotaxol for the remainder of cycles with on pro-Neulasta support which she completed on 07/17/17  7. PET scan after carbo/taxol showed:Marland Kitchen Interval decrease in size of the right breast mass and significant reduction in hypermetabolism. 2. Resolution of right axillary adenopathy and mediastinal and hilar adenopathy. 3. No findings for metastatic disease involving the abdomen/pelvis. 4. Diffuse osseous hypermetabolism likely due to rebound  from chemotherapy or marrow  stimulating drugs.  8. Right breast US showed:Ultrasound examination of the upper outer quadrant of the right breast to reassess tumor response was completed. A dominant cyst remains in the 10 o'clock position, 10 cm from the nipple. Immediately adjacent to this at the 1030 o'clock position is the primary tumor mass now measuring 1.7 x 2.7 x 4.0 cm. This area previously measured 3.7 x 3.7 x 7.2 cm. This represents an approximately 80% decrease in volume. A single mildly prominent right axillary node measuring 0.6 x 0.7 cm with internal vascular flow is noted. BI-RADS-6.Seen by Dr. Alinda Money on 08/13/17 who recommends continuing 4 cycles of dd AC followed by PET and then possible surgery/ RT    Interval history- feels well. Denies any complaints. She had mild nausea few days post chemo relieved by zofran  ECOG PS- 0 Pain scale- 0  Review of systems- Review of Systems  Constitutional: Negative for chills, fever, malaise/fatigue and weight loss.  HENT: Negative for congestion, ear discharge and nosebleeds.   Eyes: Negative for blurred vision.  Respiratory: Negative for cough, hemoptysis, sputum production, shortness of breath and wheezing.   Cardiovascular: Negative for chest pain, palpitations, orthopnea and claudication.  Gastrointestinal: Negative for abdominal pain, blood in stool, constipation, diarrhea, heartburn, melena, nausea and vomiting.  Genitourinary: Negative for dysuria, flank pain, frequency, hematuria and urgency.  Musculoskeletal: Negative for back pain, joint pain and myalgias.  Skin: Negative for rash.  Neurological: Negative for dizziness, tingling, focal weakness, seizures, weakness and headaches.  Endo/Heme/Allergies: Does not bruise/bleed easily.  Psychiatric/Behavioral: Negative for depression and suicidal ideas. The patient does not have insomnia.       Allergies  Allergen Reactions  . Penicillins Rash     Past Medical History:  Diagnosis Date  . Anemia     . Breast cancer ([place])   . Breast cyst, right    aspirated by Dr. Bary Castilla  . Hyperlipidemia   . Pre-diabetes      Past Surgical History:  Procedure Laterality Date  . AXILLARY LYMPH NODE BIOPSY Right 04/09/2017   Procedure: AXILLARY LYMPH NODE BIOPSY;  Surgeon: Robert Bellow, MD;  Location: [facility] ORS;  Service: General;  Laterality: Right;  . BREAST CYST ASPIRATION Right 11/2009   Dr. Bary Castilla did FNA  . COLONOSCOPY  2011  . DILATION AND CURETTAGE OF UTERUS     X3  . ENDOBRONCHIAL ULTRASOUND N/A 04/09/2017   Procedure: ENDOBRONCHIAL ULTRASOUND;  Surgeon: Laverle Hobby, MD;  Location: [facility] ORS;  Service: Pulmonary;  Laterality: N/A;  . ENDOMETRIAL ABLATION    . ENDOMETRIAL BIOPSY  09/2009  . PORTACATH PLACEMENT Left 04/09/2017   Procedure: INSERTION PORT-A-CATH;  Surgeon: Robert Bellow, MD;  Location: [facility] ORS;  Service: General;  Laterality: Left;    Social History   Socioeconomic History  . Marital status: Married    Spouse name: Not on file  . Number of children: Not on file  . Years of education: Not on file  . Highest education level: Not on file  Social Needs  . Financial resource strain: Not on file  . Food insecurity - worry: Not on file  . Food insecurity - inability: Not on file  . Transportation needs - medical: Not on file  . Transportation needs - non-medical: Not on file  Occupational History  . Not on file  Tobacco Use  . Smoking status: Never Smoker  . Smokeless tobacco: Never Used  Substance and Sexual Activity  . Alcohol use:  Yes    Alcohol/week: 1.2 oz    Types: 2 Glasses of wine per week  . Drug use: No  . Sexual activity: Not Currently  Other Topics Concern  . Not on file  Social History Narrative  . Not on file    Family History  Problem Relation Age of Onset  . Melanoma Maternal Grandmother 58       currently 90  . Brain cancer Maternal Grandfather 68       unk. type; deceased in 29s  . Melanoma Other 70       mat  grandmother's father  . Melanoma Father 72       on head; currently 39  . Prostate cancer Maternal Uncle        3 maternal uncles; dx in 39s  . Breast cancer Other        mat grandfather's sister; dx 16s     Current Outpatient Medications:  .  Biotin 2500 MCG CAPS, Take 5,000 mcg/day by mouth daily., Disp: , Rfl:  .  Cholecalciferol (VITAMIN D3) 10000 units TABS, Take by mouth 1 day or 1 dose., Disp: , Rfl:  .  loratadine (CLARITIN) 10 MG tablet, Take 10 mg by mouth daily., Disp: , Rfl:  .  LYCOPENE PO, Take by mouth., Disp: , Rfl:  .  Multiple Vitamins-Minerals (CENTRUM SILVER ADULT 50+) TABS, Take 1 tablet by mouth daily., Disp: , Rfl:  .  Multiple Vitamins-Minerals (EMERGEN-C IMMUNE PLUS PO), Take 1 packet by mouth 1 day or 1 dose. With energy, Disp: , Rfl:  .  omega-3 acid ethyl esters (LOVAZA) 1 g capsule, Take by mouth 1 day or 1 dose., Disp: , Rfl:  No current facility-administered medications for this visit.   Facility-Administered Medications Ordered in Other Visits:  .  Tbo-Filgrastim (GRANIX) injection 480 mcg, 480 mcg, Subcutaneous, Daily, Sindy Guadeloupe, MD, 480 mcg at 05/19/17 1412  Physical exam:  Vitals:   08/21/17 1009 08/21/17 1013  BP: 139/75   Pulse: (!) 178 85  Resp: 18   Temp: (!) 97.3 F (36.3 C)   TempSrc: Tympanic   Weight: 205 lb 11.2 oz (93.3 kg)   Height: '5\' 7"'$  (1.702 m)    Physical Exam  Constitutional: She is oriented to person, place, and time and well-developed, well-nourished, and in no distress.  HENT:  Head: Normocephalic and atraumatic.  Eyes: EOM are normal. Pupils are equal, round, and reactive to light.  Neck: Normal range of motion.  Cardiovascular: Normal rate, regular rhythm and normal heart sounds.  Pulmonary/Chest: Effort normal and breath sounds normal.  Abdominal: Soft. Bowel sounds are normal.  Neurological: She is alert and oriented to person, place, and time.  Skin: Skin is warm and dry.   persistent 2 cm right breast mass  at 10'o clock position. No palpable right axillary adenopathy  CMP Latest Ref Rng & Units 07/17/2017  Glucose 65 - 99 mg/dL 107(H)  BUN 6 - 20 mg/dL 13  Creatinine 0.44 - 1.00 mg/dL 0.60  Sodium 135 - 145 mmol/L 138  Potassium 3.5 - 5.1 mmol/L 3.9  Chloride 101 - 111 mmol/L 109  CO2 22 - 32 mmol/L 23  Calcium 8.9 - 10.3 mg/dL 8.6(L)  Total Protein 6.5 - 8.1 g/dL 6.4(L)  Total Bilirubin 0.3 - 1.2 mg/dL 0.5  Alkaline Phos 38 - 126 U/L 111  AST 15 - 41 U/L 37  ALT 14 - 54 U/L 83(H)   CBC Latest Ref Rng & Units 07/17/2017  WBC 3.6 - 11.0 K/uL 3.6  Hemoglobin 12.0 - 16.0 g/dL 11.0(L)  Hematocrit 35.0 - 47.0 % 33.3(L)  Platelets 150 - 440 K/uL 186    No images are attached to the encounter.  US Breast Complete Uni Right Inc Axilla  Result Date: 07/25/2017 Ultrasound examination of the upper outer quadrant of the right breast to reassess tumor response was completed.  A dominant cyst remains in the 10 o'clock position, 10 cm from the nipple.  Immediately adjacent to this at the 1030 o'clock position is the primary tumor mass now measuring 1.7 x 2.7 x 4.0 cm.  This area previously measured 3.7 x 3.7 x 7.2 cm.  This represents an approximately 80% decrease in volume.  A single mildly prominent right axillary node measuring 0.6 x 0.7 cm with internal vascular flow is noted.  BI-RADS-6.     Assessment and plan- Patient is a 57 y.o. 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 today shows white count of 3.1 with an ANC of 1.7.  Platelet count normal at 332.  Hemoglobin stable at 10.5.  She will proceed with cycle #2 of dose dense AC chemotherapy today with on pro-Neulasta support.  I will see her back in 2 weeks time for cycle #3 of dose dense AC chemotherapy with Neulasta support which may be  potentially delayed by 1 week if her counts do not permit.    Clinically she is responding well to chemotherapy.  Right breast mass is significantly smaller in size but is still palpable.  Recent PET/CT scan revealed resolution of hilar and mediastinal as well as axillary adenopathy adenopathy   Visit Diagnosis 1. 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 Encounter for antineoplastic chemotherapy      Dr. Randa Evens, MD, MPH [facility] Pager- [phone] 08/21/2017 1:30 PM

## 2017-08-21 NOTE — Progress Notes (Signed)
No new changes noted today 

## 2017-09-04 ENCOUNTER — Inpatient Hospital Stay: Payer: Managed Care, Other (non HMO) | Attending: Oncology | Admitting: Oncology

## 2017-09-04 ENCOUNTER — Encounter: Payer: Self-pay | Admitting: Oncology

## 2017-09-04 ENCOUNTER — Inpatient Hospital Stay: Payer: Managed Care, Other (non HMO)

## 2017-09-04 VITALS — BP 102/69 | HR 85 | Temp 97.3°F | Resp 18 | Ht 67.0 in | Wt 204.8 lb

## 2017-09-04 DIAGNOSIS — D696 Thrombocytopenia, unspecified: Secondary | ICD-10-CM | POA: Diagnosis not present

## 2017-09-04 DIAGNOSIS — C50411 Malignant neoplasm of upper-outer quadrant of right female breast: Secondary | ICD-10-CM

## 2017-09-04 DIAGNOSIS — R530 Neoplastic (malignant) related fatigue: Secondary | ICD-10-CM

## 2017-09-04 DIAGNOSIS — Z171 Estrogen receptor negative status [ER-]: Secondary | ICD-10-CM | POA: Diagnosis not present

## 2017-09-04 DIAGNOSIS — R112 Nausea with vomiting, unspecified: Secondary | ICD-10-CM | POA: Diagnosis not present

## 2017-09-04 DIAGNOSIS — Z5111 Encounter for antineoplastic chemotherapy: Secondary | ICD-10-CM | POA: Insufficient documentation

## 2017-09-04 DIAGNOSIS — C773 Secondary and unspecified malignant neoplasm of axilla and upper limb lymph nodes: Secondary | ICD-10-CM | POA: Diagnosis not present

## 2017-09-04 DIAGNOSIS — C7800 Secondary malignant neoplasm of unspecified lung: Secondary | ICD-10-CM | POA: Insufficient documentation

## 2017-09-04 DIAGNOSIS — D6481 Anemia due to antineoplastic chemotherapy: Secondary | ICD-10-CM | POA: Insufficient documentation

## 2017-09-04 DIAGNOSIS — C50911 Malignant neoplasm of unspecified site of right female breast: Secondary | ICD-10-CM

## 2017-09-04 MED ORDER — HEPARIN SOD (PORK) LOCK FLUSH 100 UNIT/ML IV SOLN
500.0000 [IU] | Freq: Once | INTRAVENOUS | Status: AC | PRN
  Administered 2017-09-04: 500 [IU]
  Filled 2017-09-04: qty 5

## 2017-09-04 MED ORDER — PEGFILGRASTIM 6 MG/0.6ML ~~LOC~~ PSKT
6.0000 mg | PREFILLED_SYRINGE | Freq: Once | SUBCUTANEOUS | Status: AC
  Administered 2017-09-04: 6 mg via SUBCUTANEOUS
  Filled 2017-09-04: qty 0.6

## 2017-09-04 MED ORDER — DOXORUBICIN HCL CHEMO IV INJECTION 2 MG/ML
60.0000 mg/m2 | Freq: Once | INTRAVENOUS | Status: AC
  Administered 2017-09-04: 124 mg via INTRAVENOUS
  Filled 2017-09-04: qty 62

## 2017-09-04 MED ORDER — SODIUM CHLORIDE 0.9 % IV SOLN
600.0000 mg/m2 | Freq: Once | INTRAVENOUS | Status: AC
  Administered 2017-09-04: 1240 mg via INTRAVENOUS
  Filled 2017-09-04: qty 50

## 2017-09-04 MED ORDER — SODIUM CHLORIDE 0.9 % IV SOLN
Freq: Once | INTRAVENOUS | Status: AC
  Administered 2017-09-04: 10:00:00 via INTRAVENOUS
  Filled 2017-09-04: qty 5

## 2017-09-04 MED ORDER — PALONOSETRON HCL INJECTION 0.25 MG/5ML
0.2500 mg | Freq: Once | INTRAVENOUS | Status: AC
  Administered 2017-09-04: 0.25 mg via INTRAVENOUS
  Filled 2017-09-04: qty 5

## 2017-09-04 MED ORDER — SODIUM CHLORIDE 0.9 % IV SOLN
Freq: Once | INTRAVENOUS | Status: AC
  Administered 2017-09-04: 10:00:00 via INTRAVENOUS
  Filled 2017-09-04: qty 1000

## 2017-09-04 NOTE — Progress Notes (Signed)
No new changes noted today 

## 2017-09-04 NOTE — Progress Notes (Signed)
Hematology/Oncology Consult note [facility]  [phone] [phone]  Patient Care Team: Patient, No Pcp Per as PCP - General (General Practice) Byrnett, Forest Gleason, MD (General Surgery) Gae Dry, MD as Referring Physician (Obstetrics and Gynecology)   Name of the patient: Ashley Molina  [id number]  [birth year]   Date of visit: 09/04/17  Meryle Ready mammary carcinoma of the right [id number] negative, PR 5% positive and HER-2/neu negative  Chief complaint/ Reason for visit-on treatment assessment prior to cycle #3of dose dense AC chemotherapy  Heme/Onc history:1. Patient is a 57 year old postmenopausal female who had a routine screening mammogram on 03/23/2017 which showed a highly suspicious palpable solid mass in the 10 o'clock position. Mass measures at least 5 cm in maximum diameter for right axillary lymph nodes have sonographic features suspicious for metastatic disease. No evidence of malignancy was noted in the left breast. This was followed by an ultrasound of the right breast including the right axilla which confirmed the same findings.Targeted ultrasound is performed, showing a markedly irregular and hypoechoic mass centered at 10 o'clock position approximately 7 cm from the nipple estimated to be at least 5.2 x 4.4 x 3.4 cm. Small areas of vascular flow are detected within the mass. At 10 o'clock position 9 cm from the nipple again noted is a simple cyst measuring approximately 2.4 cm greatest diameter. Skin thickness of the medial and lateral of right breast measures approximately 4 mm.  Ultrasound of the right axilla demonstrates 4 suspicious lymph nodes with hypoechoic thickened cortices. The largest of these lymph nodes measures 2.2 x 1.2 x 1.0 cm and has a cortical thickness of 4-5 mm.  Patient underwent core biopsy of the right breast mass.  2. Biopsy showed 15 mm invasive  carcinoma, grade 3. Right axillary lymph node biopsy was also consistent with metastatic carcinoma. ERnegativePR5% positive,and HER-2negative by FISH  3. Patient is G3P3L3. Menarche at the age of 29. Menopause at 49. No priro h/o breast biopsies. No family h/o breast cancer. She was known to have right breast cyst being followed by Dr. Bary Castilla. She noticed this amss sometime over the summer and feels this has grown since then. She is healthy and has no significant co-morbidities  4. PET/CT scan on 04/04/17 showed:IMPRESSION: 1. Hypermetabolic right breast mass with hypermetabolic lymph nodes in the right axilla and hypermetabolic metastatic disease in both hilar regions and subcarinal mediastinum. 2. No evidence for hypermetabolic metastatic disease in the neck, abdomen or pelvis  5. Patient underwent bronchoscopy and biopsy of hilar LN which was positive for metastatic carcinoma from the breast. She also underwent RUl biopsy and bronchial washings that were negative for malignancy.Baseline CA-15-3 normal at 16.9 And CA-27-29 normal at 15.8. Patient seen by Dr. Alinda Money at [facility] and plan was to proceed with carbo/taxol followed by pet and if she has a good response proceed to dd [facility] X4 and possible surgery and RT down the line  6.Patient has had significant neutropenia despite dose reduction of carboplatin and adding Neupogen requiring dose interruptions. Plan wastherefore to switch her to every 2 weekly cycle of carbotaxol for the remainder of cycles with on pro-Neulasta support which she completed on 07/17/17  7. PET scan after carbo/taxol showed:Marland Kitchen Interval decrease in size of the right breast mass and significant reduction in hypermetabolism. 2. Resolution of right axillary adenopathy and mediastinal and hilar adenopathy. 3. No findings for metastatic disease involving the abdomen/pelvis. 4. Diffuse osseous hypermetabolism likely due to rebound from  chemotherapy or marrow  stimulating drugs.  8. Right breast US showed:Ultrasound examination of the upper outer quadrant of the right breast to reassess tumor response was completed. A dominant cyst remains in the 10 o'clock position, 10 cm from the nipple. Immediately adjacent to this at the 1030 o'clock position is the primary tumor mass now measuring 1.7 x 2.7 x 4.0 cm. This area previously measured 3.7 x 3.7 x 7.2 cm. This represents an approximately 80% decrease in volume. A single mildly prominent right axillary node measuring 0.6 x 0.7 cm with internal vascular flow is noted. BI-RADS-6.Seen by Dr. Alinda Money on 08/13/17 who recommends continuing 4 cycles of dd AC followed by PET and then possible surgery/ RT  Interval history- reports fatigue for a week post chemo. Appetite has been fair and she has some nausea few days post chemo. Denies other complaints  ECOG PS- 0 Pain scale- 0   Review of systems- Review of Systems  Constitutional: Positive for malaise/fatigue. Negative for chills, fever and weight loss.  HENT: Negative for congestion, ear discharge and nosebleeds.   Eyes: Negative for blurred vision.  Respiratory: Negative for cough, hemoptysis, sputum production, shortness of breath and wheezing.   Cardiovascular: Negative for chest pain, palpitations, orthopnea and claudication.  Gastrointestinal: Positive for nausea. Negative for abdominal pain, blood in stool, constipation, diarrhea, heartburn, melena and vomiting.  Genitourinary: Negative for dysuria, flank pain, frequency, hematuria and urgency.  Musculoskeletal: Negative for back pain, joint pain and myalgias.  Skin: Negative for rash.  Neurological: Negative for dizziness, tingling, focal weakness, seizures, weakness and headaches.  Endo/Heme/Allergies: Does not bruise/bleed easily.  Psychiatric/Behavioral: Negative for depression and suicidal ideas. The patient does not have insomnia.       Allergies  Allergen Reactions  . Penicillins Rash      Past Medical History:  Diagnosis Date  . Anemia   . Breast cancer ([place])   . Breast cyst, right    aspirated by Dr. Bary Castilla  . Hyperlipidemia   . Pre-diabetes      Past Surgical History:  Procedure Laterality Date  . AXILLARY LYMPH NODE BIOPSY Right 04/09/2017   Procedure: AXILLARY LYMPH NODE BIOPSY;  Surgeon: Robert Bellow, MD;  Location: [facility] ORS;  Service: General;  Laterality: Right;  . BREAST CYST ASPIRATION Right 11/2009   Dr. Bary Castilla did FNA  . COLONOSCOPY  2011  . DILATION AND CURETTAGE OF UTERUS     X3  . ENDOBRONCHIAL ULTRASOUND N/A 04/09/2017   Procedure: ENDOBRONCHIAL ULTRASOUND;  Surgeon: Laverle Hobby, MD;  Location: [facility] ORS;  Service: Pulmonary;  Laterality: N/A;  . ENDOMETRIAL ABLATION    . ENDOMETRIAL BIOPSY  09/2009  . PORTACATH PLACEMENT Left 04/09/2017   Procedure: INSERTION PORT-A-CATH;  Surgeon: Robert Bellow, MD;  Location: [facility] ORS;  Service: General;  Laterality: Left;    Social History   Socioeconomic History  . Marital status: Married    Spouse name: Not on file  . Number of children: Not on file  . Years of education: Not on file  . Highest education level: Not on file  Occupational History  . Not on file  Social Needs  . Financial resource strain: Not on file  . Food insecurity:    Worry: Not on file    Inability: Not on file  . Transportation needs:    Medical: Not on file    Non-medical: Not on file  Tobacco Use  . Smoking status: Never Smoker  . Smokeless tobacco: Never Used  Substance and Sexual Activity  . Alcohol use: Yes    Alcohol/week: 1.2 oz    Types: 2 Glasses of wine per week  . Drug use: No  . Sexual activity: Not Currently  Lifestyle  . Physical activity:    Days per week: Not on file    Minutes per session: Not on file  . Stress: Not on file  Relationships  . Social connections:    Talks on phone: Not on file    Gets together: Not on file    Attends religious service: Not on file     Active member of club or organization: Not on file    Attends meetings of clubs or organizations: Not on file    Relationship status: Not on file  . Intimate partner violence:    Fear of current or ex partner: Not on file    Emotionally abused: Not on file    Physically abused: Not on file    Forced sexual activity: Not on file  Other Topics Concern  . Not on file  Social History Narrative  . Not on file    Family History  Problem Relation Age of Onset  . Melanoma Maternal Grandmother 65       currently 31  . Brain cancer Maternal Grandfather 37       unk. type; deceased in 99s  . Melanoma Other 61       mat grandmother's father  . Melanoma Father 58       on head; currently 58  . Prostate cancer Maternal Uncle        3 maternal uncles; dx in 72s  . Breast cancer Other        mat grandfather's sister; dx 66s     Current Outpatient Medications:  .  Biotin 2500 MCG CAPS, Take 5,000 mcg/day by mouth daily., Disp: , Rfl:  .  Cholecalciferol (VITAMIN D3) 10000 units TABS, Take by mouth 1 day or 1 dose., Disp: , Rfl:  .  loratadine (CLARITIN) 10 MG tablet, Take 10 mg by mouth daily., Disp: , Rfl:  .  Multiple Vitamins-Minerals (CENTRUM SILVER ADULT 50+) TABS, Take 1 tablet by mouth daily., Disp: , Rfl:  .  Multiple Vitamins-Minerals (EMERGEN-C IMMUNE PLUS PO), Take 1 packet by mouth 1 day or 1 dose. With energy, Disp: , Rfl:  .  omega-3 acid ethyl esters (LOVAZA) 1 g capsule, Take by mouth 1 day or 1 dose., Disp: , Rfl:  .  LYCOPENE PO, Take by mouth., Disp: , Rfl:  No current facility-administered medications for this visit.   Facility-Administered Medications Ordered in Other Visits:  .  Tbo-Filgrastim (GRANIX) injection 480 mcg, 480 mcg, Subcutaneous, Daily, Sindy Guadeloupe, MD, 480 mcg at 05/19/17 1412  Physical exam:  Vitals:   09/04/17 0833  BP: 102/69  Pulse: 85  Resp: 18  Temp: (!) 97.3 F (36.3 C)  TempSrc: Tympanic  Weight: 204 lb 12.8 oz (92.9 kg)  Height: 5'  7" (1.702 m)   Physical Exam  Constitutional: She is oriented to person, place, and time and well-developed, well-nourished, and in no distress.  HENT:  Head: Normocephalic and atraumatic.  Eyes: Pupils are equal, round, and reactive to light. EOM are normal.  Neck: Normal range of motion.  Cardiovascular: Normal rate, regular rhythm and normal heart sounds.  Pulmonary/Chest: Effort normal and breath sounds normal.  Abdominal: Soft. Bowel sounds are normal.  Neurological: She is alert and oriented to person, place, and time.  Skin: Skin is warm and dry.   palpable breast mass ~ 2 cm in size at 10 o clock position. No palpable adenopathy  CMP Latest Ref Rng & Units 07/17/2017  Glucose 65 - 99 mg/dL 107(H)  BUN 6 - 20 mg/dL 13  Creatinine 0.44 - 1.00 mg/dL 0.60  Sodium 135 - 145 mmol/L 138  Potassium 3.5 - 5.1 mmol/L 3.9  Chloride 101 - 111 mmol/L 109  CO2 22 - 32 mmol/L 23  Calcium 8.9 - 10.3 mg/dL 8.6(L)  Total Protein 6.5 - 8.1 g/dL 6.4(L)  Total Bilirubin 0.3 - 1.2 mg/dL 0.5  Alkaline Phos 38 - 126 U/L 111  AST 15 - 41 U/L 37  ALT 14 - 54 U/L 83(H)   CBC Latest Ref Rng & Units 07/17/2017  WBC 3.6 - 11.0 K/uL 3.6  Hemoglobin 12.0 - 16.0 g/dL 11.0(L)  Hematocrit 35.0 - 47.0 % 33.3(L)  Platelets 150 - 440 K/uL 186      Assessment and plan- Patient is a 57 y.o. 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  She is not neutropenic today and her anemia is at her baseline.  Counts are therefore okay to proceed with cycle #3 of dose dense AC chemotherapy with on pro-Neulasta support today.  I will see her back in 2 weeks time for cycle #4 of dose dense AC.  She will get a repeat  PET/CT scan on 09/21/2017.  She has a multidisciplinary meeting with Dr. Alinda Money as well as the surgery and radiation team at [facility] on 09/26/2017.  They will be repeating her ultrasound of her right breast at [facility] on that day and determine if she should proceed with definitive surgery followed by radiation to her chest wall as well as mediastinal lymph nodes on that day.  Chemo-induced nausea vomiting: Mild and well controlled with medications.  Continue to monitor    Visit Diagnosis 1. 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 Encounter for antineoplastic chemotherapy   3. Malignant neoplasm of upper-outer quadrant of right breast in female, estrogen receptor negative ([place])      Dr. Randa Evens, MD, MPH [place] at [facility] Pager- [phone] 09/04/2017 1:20 PM

## 2017-09-05 ENCOUNTER — Telehealth: Payer: Self-pay | Admitting: *Deleted

## 2017-09-05 NOTE — Telephone Encounter (Signed)
Pt called and left message that she has appt for [facility] is April 24 with Dr. Alinda Money at 10 am. Then Dr. Nigel Mormon 11 am and he is breast surgeon, and 12 noon Dr. Lyndel Safe for radiation onc.

## 2017-09-18 ENCOUNTER — Inpatient Hospital Stay (HOSPITAL_BASED_OUTPATIENT_CLINIC_OR_DEPARTMENT_OTHER): Payer: Managed Care, Other (non HMO) | Admitting: Oncology

## 2017-09-18 ENCOUNTER — Other Ambulatory Visit: Payer: Self-pay | Admitting: Oncology

## 2017-09-18 ENCOUNTER — Encounter: Payer: Self-pay | Admitting: Oncology

## 2017-09-18 ENCOUNTER — Inpatient Hospital Stay: Payer: Managed Care, Other (non HMO)

## 2017-09-18 VITALS — BP 100/64 | HR 88 | Temp 97.7°F | Resp 18 | Ht 67.0 in | Wt 201.0 lb

## 2017-09-18 DIAGNOSIS — C50411 Malignant neoplasm of upper-outer quadrant of right female breast: Secondary | ICD-10-CM

## 2017-09-18 DIAGNOSIS — D6481 Anemia due to antineoplastic chemotherapy: Secondary | ICD-10-CM

## 2017-09-18 DIAGNOSIS — C773 Secondary and unspecified malignant neoplasm of axilla and upper limb lymph nodes: Secondary | ICD-10-CM

## 2017-09-18 DIAGNOSIS — Z171 Estrogen receptor negative status [ER-]: Secondary | ICD-10-CM | POA: Diagnosis not present

## 2017-09-18 DIAGNOSIS — C7800 Secondary malignant neoplasm of unspecified lung: Secondary | ICD-10-CM | POA: Diagnosis not present

## 2017-09-18 DIAGNOSIS — Z5111 Encounter for antineoplastic chemotherapy: Secondary | ICD-10-CM

## 2017-09-18 MED ORDER — PALONOSETRON HCL INJECTION 0.25 MG/5ML
0.2500 mg | Freq: Once | INTRAVENOUS | Status: AC
  Administered 2017-09-18: 0.25 mg via INTRAVENOUS
  Filled 2017-09-18: qty 5

## 2017-09-18 MED ORDER — HEPARIN SOD (PORK) LOCK FLUSH 100 UNIT/ML IV SOLN
INTRAVENOUS | Status: AC
Start: 2017-09-18 — End: 2017-09-18
  Filled 2017-09-18: qty 5

## 2017-09-18 MED ORDER — SODIUM CHLORIDE 0.9 % IV SOLN
Freq: Once | INTRAVENOUS | Status: AC
  Administered 2017-09-18: 09:00:00 via INTRAVENOUS
  Filled 2017-09-18: qty 1000

## 2017-09-18 MED ORDER — SODIUM CHLORIDE 0.9 % IV SOLN
Freq: Once | INTRAVENOUS | Status: AC
  Administered 2017-09-18: 10:00:00 via INTRAVENOUS
  Filled 2017-09-18: qty 5

## 2017-09-18 MED ORDER — DOXORUBICIN HCL CHEMO IV INJECTION 2 MG/ML
60.0000 mg/m2 | Freq: Once | INTRAVENOUS | Status: AC
  Administered 2017-09-18: 124 mg via INTRAVENOUS
  Filled 2017-09-18: qty 62

## 2017-09-18 MED ORDER — SODIUM CHLORIDE 0.9 % IV SOLN
600.0000 mg/m2 | Freq: Once | INTRAVENOUS | Status: AC
  Administered 2017-09-18: 1240 mg via INTRAVENOUS
  Filled 2017-09-18: qty 50

## 2017-09-18 MED ORDER — PEGFILGRASTIM 6 MG/0.6ML ~~LOC~~ PSKT
6.0000 mg | PREFILLED_SYRINGE | Freq: Once | SUBCUTANEOUS | Status: AC
  Administered 2017-09-18: 6 mg via SUBCUTANEOUS
  Filled 2017-09-18: qty 0.6

## 2017-09-18 NOTE — Progress Notes (Signed)
No new changes noted today 

## 2017-09-18 NOTE — Progress Notes (Signed)
Hematology/Oncology Consult note [facility]  [phone] [phone]  Patient Care Team: Patient, No Pcp Per as PCP - General (General Practice) Byrnett, Forest Gleason, MD (General Surgery) Gae Dry, MD as Referring Physician (Obstetrics and Gynecology)   Name of the patient: Ashley Molina  [id number]  [birth year]   Date of visit: 09/18/17  Meryle Ready mammary carcinoma of the right breastcT3cN1cM1ER negative, PR 5% positive and HER-2/neu negative  Chief complaint/ Reason for visit-on treatment assessment prior to cycle #4 of dose dense AC chemotherapy  Heme/Onc history:1. Patient is a 57 year old postmenopausal female who had a routine screening mammogram on 03/23/2017 which showed a highly suspicious palpable solid mass in the 10 o'clock position. Mass measures at least 5 cm in maximum diameter for right axillary lymph nodes have sonographic features suspicious for metastatic disease. No evidence of malignancy was noted in the left breast. This was followed by an ultrasound of the right breast including the right axilla which confirmed the same findings.Targeted ultrasound is performed, showing a markedly irregular and hypoechoic mass centered at 10 o'clock position approximately 7 cm from the nipple estimated to be at least 5.2 x 4.4 x 3.4 cm. Small areas of vascular flow are detected within the mass. At 10 o'clock position 9 cm from the nipple again noted is a simple cyst measuring approximately 2.4 cm greatest diameter. Skin thickness of the medial and lateral of right breast measures approximately 4 mm.  Ultrasound of the right axilla demonstrates 4 suspicious lymph nodes with hypoechoic thickened cortices. The largest of these lymph nodes measures 2.2 x 1.2 x 1.0 cm and has a cortical thickness of 4-5 mm.  Patient underwent core biopsy of the right breast mass.  2. Biopsy showed 15 mm invasive  carcinoma, grade 3. Right axillary lymph node biopsy was also consistent with metastatic carcinoma. ERnegativePR5% positive,and HER-2negative by FISH  3. Patient is G3P3L3. Menarche at the age of 44. Menopause at 57. No priro h/o breast biopsies. No family h/o breast cancer. She was known to have right breast cyst being followed by Dr. Bary Castilla. She noticed this amss sometime over the summer and feels this has grown since then. She is healthy and has no significant co-morbidities  4. PET/CT scan on 04/04/17 showed:IMPRESSION: 1. Hypermetabolic right breast mass with hypermetabolic lymph nodes in the right axilla and hypermetabolic metastatic disease in both hilar regions and subcarinal mediastinum. 2. No evidence for hypermetabolic metastatic disease in the neck, abdomen or pelvis  5. Patient underwent bronchoscopy and biopsy of hilar LN which was positive for metastatic carcinoma from the breast. She also underwent RUl biopsy and bronchial washings that were negative for malignancy.Baseline CA-15-3 normal at 16.9 And CA-27-29 normal at 15.8. Patient seen by Dr. Alinda Money at [facility] and plan was to proceed with carbo/taxol followed by pet and if she has a good response proceed to dd [facility] X4 and possible surgery and RT down the line  6.Patient has had significant neutropenia despite dose reduction of carboplatin and adding Neupogen requiring dose interruptions. Plan wastherefore to switch her to every 2 weekly cycle of carbotaxol for the remainder of cycles with on pro-Neulasta support which she completed on 07/17/17  7. PET scan after carbo/taxol showed:Marland Kitchen Interval decrease in size of the right breast mass and significant reduction in hypermetabolism. 2. Resolution of right axillary adenopathy and mediastinal and hilar adenopathy. 3. No findings for metastatic disease involving the abdomen/pelvis. 4. Diffuse osseous hypermetabolism likely due to rebound  from chemotherapy or marrow  stimulating drugs.  8. Right breast US showed:Ultrasound examination of the upper outer quadrant of the right breast to reassess tumor response was completed. A dominant cyst remains in the 10 o'clock position, 10 cm from the nipple. Immediately adjacent to this at the 1030 o'clock position is the primary tumor mass now measuring 1.7 x 2.7 x 4.0 cm. This area previously measured 3.7 x 3.7 x 7.2 cm. This represents an approximately 80% decrease in volume. A single mildly prominent right axillary node measuring 0.6 x 0.7 cm with internal vascular flow is noted. BI-RADS-6.Seen by Dr. Alinda Money on 08/13/17 who recommends continuing 4 cycles of dd AC followed by PET and then possible surgery/ RT    Interval history- reports metallic taste in her mouth after chemo. Mild nausea and fatigue. No vomiting. Denies other complaints  ECOG PS- 0 Pain scale- 0   Review of systems- Review of Systems  Constitutional: Positive for malaise/fatigue. Negative for chills, fever and weight loss.  HENT: Negative for congestion, ear discharge and nosebleeds.   Eyes: Negative for blurred vision.  Respiratory: Negative for cough, hemoptysis, sputum production, shortness of breath and wheezing.   Cardiovascular: Negative for chest pain, palpitations, orthopnea and claudication.  Gastrointestinal: Positive for nausea. Negative for abdominal pain, blood in stool, constipation, diarrhea, heartburn, melena and vomiting.  Genitourinary: Negative for dysuria, flank pain, frequency, hematuria and urgency.  Musculoskeletal: Negative for back pain, joint pain and myalgias.  Skin: Negative for rash.  Neurological: Negative for dizziness, tingling, focal weakness, seizures, weakness and headaches.  Endo/Heme/Allergies: Does not bruise/bleed easily.  Psychiatric/Behavioral: Negative for depression and suicidal ideas. The patient does not have insomnia.        Allergies  Allergen Reactions  . Penicillins Rash     Past  Medical History:  Diagnosis Date  . Anemia   . Breast cancer ([place])   . Breast cyst, right    aspirated by Dr. Bary Castilla  . Hyperlipidemia   . Pre-diabetes      Past Surgical History:  Procedure Laterality Date  . AXILLARY LYMPH NODE BIOPSY Right 04/09/2017   Procedure: AXILLARY LYMPH NODE BIOPSY;  Surgeon: Robert Bellow, MD;  Location: [facility] ORS;  Service: General;  Laterality: Right;  . BREAST CYST ASPIRATION Right 11/2009   Dr. Bary Castilla did FNA  . COLONOSCOPY  2011  . DILATION AND CURETTAGE OF UTERUS     X3  . ENDOBRONCHIAL ULTRASOUND N/A 04/09/2017   Procedure: ENDOBRONCHIAL ULTRASOUND;  Surgeon: Laverle Hobby, MD;  Location: [facility] ORS;  Service: Pulmonary;  Laterality: N/A;  . ENDOMETRIAL ABLATION    . ENDOMETRIAL BIOPSY  09/2009  . PORTACATH PLACEMENT Left 04/09/2017   Procedure: INSERTION PORT-A-CATH;  Surgeon: Robert Bellow, MD;  Location: [facility] ORS;  Service: General;  Laterality: Left;    Social History   Socioeconomic History  . Marital status: Married    Spouse name: Not on file  . Number of children: Not on file  . Years of education: Not on file  . Highest education level: Not on file  Occupational History  . Not on file  Social Needs  . Financial resource strain: Not on file  . Food insecurity:    Worry: Not on file    Inability: Not on file  . Transportation needs:    Medical: Not on file    Non-medical: Not on file  Tobacco Use  . Smoking status: Never Smoker  . Smokeless tobacco: Never Used  Substance and  Sexual Activity  . Alcohol use: Yes    Alcohol/week: 1.2 oz    Types: 2 Glasses of wine per week  . Drug use: No  . Sexual activity: Not Currently  Lifestyle  . Physical activity:    Days per week: Not on file    Minutes per session: Not on file  . Stress: Not on file  Relationships  . Social connections:    Talks on phone: Not on file    Gets together: Not on file    Attends religious service: Not on file    Active member of  club or organization: Not on file    Attends meetings of clubs or organizations: Not on file    Relationship status: Not on file  . Intimate partner violence:    Fear of current or ex partner: Not on file    Emotionally abused: Not on file    Physically abused: Not on file    Forced sexual activity: Not on file  Other Topics Concern  . Not on file  Social History Narrative  . Not on file    Family History  Problem Relation Age of Onset  . Melanoma Maternal Grandmother 8       currently 96  . Brain cancer Maternal Grandfather 67       unk. type; deceased in 68s  . Melanoma Other 10       mat grandmother's father  . Melanoma Father 52       on head; currently 22  . Prostate cancer Maternal Uncle        3 maternal uncles; dx in 9s  . Breast cancer Other        mat grandfather's sister; dx 68s     Current Outpatient Medications:  .  Biotin 2500 MCG CAPS, Take 5,000 mcg/day by mouth daily., Disp: , Rfl:  .  Cholecalciferol (VITAMIN D3) 10000 units TABS, Take by mouth 1 day or 1 dose., Disp: , Rfl:  .  loratadine (CLARITIN) 10 MG tablet, Take 10 mg by mouth daily., Disp: , Rfl:  .  LYCOPENE PO, Take by mouth., Disp: , Rfl:  .  Multiple Vitamins-Minerals (CENTRUM SILVER ADULT 50+) TABS, Take 1 tablet by mouth daily., Disp: , Rfl:  .  Multiple Vitamins-Minerals (EMERGEN-C IMMUNE PLUS PO), Take 1 packet by mouth 1 day or 1 dose. With energy, Disp: , Rfl:  .  omega-3 acid ethyl esters (LOVAZA) 1 g capsule, Take by mouth 1 day or 1 dose., Disp: , Rfl:  No current facility-administered medications for this visit.   Facility-Administered Medications Ordered in Other Visits:  .  Tbo-Filgrastim (GRANIX) injection 480 mcg, 480 mcg, Subcutaneous, Daily, Sindy Guadeloupe, MD, 480 mcg at 05/19/17 1412  Physical exam:  Vitals:   09/18/17 0840  BP: 100/64  Pulse: 88  Resp: 18  Temp: 97.7 F (36.5 C)  TempSrc: Tympanic  Weight: 201 lb (91.2 kg)  Height: _0  (1.702 m)   Physical  Exam  Constitutional: She is oriented to person, place, and time.  HENT:  Head: Normocephalic and atraumatic.  Eyes: Pupils are equal, round, and reactive to light. EOM are normal.  Neck: Normal range of motion.  Cardiovascular: Normal rate, regular rhythm and normal heart sounds.  Pulmonary/Chest: Effort normal and breath sounds normal.  Abdominal: Soft. Bowel sounds are normal.  Neurological: She is alert and oriented to person, place, and time.  Skin: Skin is warm and dry.   there is still a  palpable mass in the right breast ~1.5 cm in size 10 o clock position. No palpable axillary adenopathy  CMP Latest Ref Rng & Units 07/17/2017  Glucose 65 - 99 mg/dL 107(H)  BUN 6 - 20 mg/dL 13  Creatinine 0.44 - 1.00 mg/dL 0.60  Sodium 135 - 145 mmol/L 138  Potassium 3.5 - 5.1 mmol/L 3.9  Chloride 101 - 111 mmol/L 109  CO2 22 - 32 mmol/L 23  Calcium 8.9 - 10.3 mg/dL 8.6(L)  Total Protein 6.5 - 8.1 g/dL 6.4(L)  Total Bilirubin 0.3 - 1.2 mg/dL 0.5  Alkaline Phos 38 - 126 U/L 111  AST 15 - 41 U/L 37  ALT 14 - 54 U/L 83(H)   CBC Latest Ref Rng & Units 07/17/2017  WBC 3.6 - 11.0 K/uL 3.6  Hemoglobin 12.0 - 16.0 g/dL 11.0(L)  Hematocrit 35.0 - 47.0 % 33.3(L)  Platelets 150 - 440 K/uL 186      Assessment and plan- Patient is a 57 y.o. 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  Counts are therefore okay to proceed with cycle #4 of dose dense AC chemotherapy today.  She will be getting repeat PET/CT scan on 09/21/2017.  Following that he will be seeing Dr. Alinda Money and his multidisciplinary team at [facility] 09/26/2017 to discuss the possibility of surgery followed by adjuvant radiation. She is likely to  get USG right breast at [facility].   Chemo-induced anemia: H&H stable between 9-10.  Iron studies B12 and folate done prior were normal.  This is therefore chemo-induced anemia which we will continue to monitor.  I will see her back on 10/02/2017 with labs prior CBC, BMP.  I will await further recommendations from Dr. Alinda Money at this time to guide further management and coordinate her surgery with Dr. Bary Castilla accordingly.     Visit Diagnosis 1. Encounter for antineoplastic chemotherapy   2. Malignant neoplasm of upper-outer quadrant of right breast in female, estrogen receptor negative ([place])      Dr. Randa Evens, MD, MPH [facility] [phone] 09/18/2017 9:22 AM

## 2017-09-19 ENCOUNTER — Other Ambulatory Visit: Payer: Self-pay | Admitting: *Deleted

## 2017-09-19 ENCOUNTER — Telehealth: Payer: Self-pay | Admitting: *Deleted

## 2017-09-19 DIAGNOSIS — C50911 Malignant neoplasm of unspecified site of right female breast: Secondary | ICD-10-CM

## 2017-09-19 DIAGNOSIS — C7800 Secondary malignant neoplasm of unspecified lung: Principal | ICD-10-CM

## 2017-09-19 NOTE — Telephone Encounter (Signed)
Pt called and Dr. Janese Banks had asked her  About gettingu/s with Dr. Alinda Money and [facility] and if it can't be done we will get one here to check status of tumor.  Patient called today to say that dr. Alinda Money office was not planning on doing one so she is calling to get one done here. Dr. Janese Banks wanted me to call dr Bary Castilla office since he had done one before do repeat it. I called and spoke to [place] and she said in the time frame of having it done before wed of next week because that is when she sees Dr. Alinda Money that they did not have a appt spot to do it. I called jamie in mammography and they can do it Friday 4/19 at 8:40 and I entered order and called pt. Patient is worried about time frame between u/s and pet scan on same day will it be enough time. I told her that I will call In am and let her know. She is agreeable to plan

## 2017-09-20 ENCOUNTER — Telehealth: Payer: Self-pay | Admitting: *Deleted

## 2017-09-20 ENCOUNTER — Other Ambulatory Visit: Payer: Self-pay | Admitting: *Deleted

## 2017-09-20 DIAGNOSIS — C7800 Secondary malignant neoplasm of unspecified lung: Principal | ICD-10-CM

## 2017-09-20 DIAGNOSIS — C50911 Malignant neoplasm of unspecified site of right female breast: Secondary | ICD-10-CM

## 2017-09-20 NOTE — Telephone Encounter (Signed)
Called pt and let her know that the u/s would take longer amount of time than she has before getting pet scan.  So they have r/s u/s to 1:40 tom. After her pet scan. Dr Janese Banks also wanted pt to have port flush so I will send message to schedule it for 11/15/17 and I will send message to scheduler and she will put it in for afternoon and pt will see it on my chart. Ashley Molina agreeable to plan

## 2017-09-21 ENCOUNTER — Ambulatory Visit
Admission: RE | Admit: 2017-09-21 | Discharge: 2017-09-21 | Disposition: A | Discharge status: Home / Self Care | Payer: Managed Care, Other (non HMO) | Source: Ambulatory Visit | Attending: Oncology | Admitting: Oncology

## 2017-09-21 DIAGNOSIS — C7800 Secondary malignant neoplasm of unspecified lung: Principal | ICD-10-CM

## 2017-09-21 DIAGNOSIS — C50911 Malignant neoplasm of unspecified site of right female breast: Secondary | ICD-10-CM

## 2017-09-21 HISTORY — DX: Personal history of antineoplastic chemotherapy: Z92.21

## 2017-09-21 LAB — GLUCOSE, CAPILLARY: Glucose-Capillary: 91 mg/dL (ref 65–99)

## 2017-09-21 MED ORDER — FLUDEOXYGLUCOSE F - 18 (FDG) INJECTION
10.9000 | Freq: Once | INTRAVENOUS | Status: AC
  Administered 2017-09-21: 10.9 via INTRAVENOUS

## 2017-09-24 ENCOUNTER — Other Ambulatory Visit: Payer: Self-pay | Admitting: Oncology

## 2017-09-28 HISTORY — PX: BREAST EXCISIONAL BIOPSY: SUR124

## 2017-10-02 ENCOUNTER — Inpatient Hospital Stay (HOSPITAL_BASED_OUTPATIENT_CLINIC_OR_DEPARTMENT_OTHER): Payer: Managed Care, Other (non HMO) | Admitting: Oncology

## 2017-10-02 ENCOUNTER — Encounter: Payer: Self-pay | Admitting: Oncology

## 2017-10-02 ENCOUNTER — Other Ambulatory Visit: Payer: Self-pay

## 2017-10-02 VITALS — BP 103/68 | HR 90 | Temp 99.1°F | Resp 16 | Ht 63.0 in | Wt 199.1 lb

## 2017-10-02 DIAGNOSIS — C7800 Secondary malignant neoplasm of unspecified lung: Secondary | ICD-10-CM

## 2017-10-02 DIAGNOSIS — C773 Secondary and unspecified malignant neoplasm of axilla and upper limb lymph nodes: Secondary | ICD-10-CM | POA: Diagnosis not present

## 2017-10-02 DIAGNOSIS — D696 Thrombocytopenia, unspecified: Secondary | ICD-10-CM | POA: Diagnosis not present

## 2017-10-02 DIAGNOSIS — T451X5A Adverse effect of antineoplastic and immunosuppressive drugs, initial encounter: Secondary | ICD-10-CM

## 2017-10-02 DIAGNOSIS — C50411 Malignant neoplasm of upper-outer quadrant of right female breast: Secondary | ICD-10-CM

## 2017-10-02 DIAGNOSIS — Z171 Estrogen receptor negative status [ER-]: Secondary | ICD-10-CM

## 2017-10-02 DIAGNOSIS — D6481 Anemia due to antineoplastic chemotherapy: Secondary | ICD-10-CM

## 2017-10-02 NOTE — Progress Notes (Signed)
Patient here for follow up. She reports a sore mouth leg swelling.

## 2017-10-04 NOTE — Progress Notes (Signed)
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 Patient is a 57 year old postmenopausal female who had a routine screening mammogram on 03/23/2017 which showed a highly suspicious palpable solid mass in the 10 o'clock position. Mass measures at least 5 cm in maximum diameter for right axillary lymph nodes have sonographic features suspicious for metastatic disease. No evidence of malignancy was noted in the left breast. This was followed by an ultrasound of the right breast including the right axilla which confirmed the same findings.Targeted ultrasound is performed, showing a markedly irregular and hypoechoic mass centered at 10 o'clock position approximately 7 cm from the nipple estimated to be at least 5.2 x 4.4 x 3.4 cm. Small areas of vascular flow are detected within the mass. At 10 o'clock position 9 cm from the nipple again noted is a simple cyst measuring approximately 2.4 cm greatest diameter. Skin thickness of the medial and lateral of right breast measures approximately 4 mm.  Ultrasound of the right axilla demonstrates 4 suspicious lymph nodes with hypoechoic thickened cortices. The largest of these lymph nodes measures 2.2 x 1.2 x 1.0 cm and has a cortical thickness of 4-5 mm.  Patient underwent core biopsy of the right breast mass.  2. 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  Right axillary lymph node biopsy was also consistent with metastatic carcinoma. ERnegativePR5% positive,and HER-2negative by FISH  3. Patient is G3P3L3. Menarche at the age of 60. Menopause at 56. No priro h/o breast biopsies. No family h/o breast cancer. She was known to have right breast cyst being followed by Dr. Bary Castilla. She noticed this amss sometime over the summer and feels this has grown since then. She is healthy and has no significant co-morbidities  4. PET/CT scan on 04/04/17 showed:IMPRESSION: 1. Hypermetabolic right breast mass with hypermetabolic lymph nodes in the right axilla and hypermetabolic metastatic disease in both hilar regions and subcarinal mediastinum. 2. No evidence for hypermetabolic metastatic disease in the neck, abdomen or pelvis  5. Patient underwent bronchoscopy and biopsy of hilar LN which was positive for metastatic carcinoma from the breast. She also underwent RUl biopsy and bronchial washings that were negative for malignancy.Baseline CA-15-3 normal at 16.9 And CA-27-29 normal at 15.8. Patient seen by Dr. Alinda Money at [facility] and plan was to proceed with carbo/taxol followed by pet and if she has a good response proceed to dd [facility] X4 and possible surgery and RT down the line  6.Patient has had significant neutropenia despite dose reduction of carboplatin and adding Neupogen requiring dose interruptions. Plan wastherefore to switch her to every 2 weekly cycle of carbotaxol for the remainder of cycles with on pro-Neulasta support which she completed on 07/17/17  7. PET scan after carbo/taxol showed:Marland Kitchen Interval decrease in size of the right breast mass and significant reduction in hypermetabolism. 2. Resolution of right axillary adenopathy and mediastinal and hilar adenopathy. 3. No findings for metastatic disease involving the abdomen/pelvis. 4. Diffuse osseous hypermetabolism likely due to rebound from chemotherapy or  marrow stimulating drugs.  8.  Right breast US showed:Ultrasound examination of the upper outer quadrant of the right breast to reassess tumor response was completed. A dominant cyst remains in the 10 o'clock position, 10 cm from the nipple. Immediately adjacent to this at the 1030 o'clock position is the primary tumor mass now measuring 1.7 x 2.7 x 4.0 cm. This area previously measured 3.7 x 3.7 x 7.2 cm. This represents an approximately 80% decrease in volume. A single mildly prominent right axillary node measuring 0.6 x 0.7 cm with internal vascular flow is noted. BI-RADS-6.Seen by Dr. Alinda Money on 08/13/17 who recommends continuing 4 cycles of dd AC followed by PET and then possible surgery/ RT  9. Patient seen by [facility] team at [facility]. Plan is to proceed with definitive surgery- right lumpectomy and ALND followed by adjuvant RT including RT to the hilar region.  Interval history- she is doing well and has tolerated her chemotherapy well. She denies any side effects today  ECOG PS- 0 Pain scale- 0   Review of systems- Review of Systems  Constitutional: Negative for chills, fever, malaise/fatigue and weight loss.  HENT: Negative for congestion, ear discharge and nosebleeds.   Eyes: Negative for blurred vision.  Respiratory: Negative for cough, hemoptysis, sputum production, shortness of breath and wheezing.   Cardiovascular: Negative for chest pain, palpitations, orthopnea and claudication.  Gastrointestinal: Negative for abdominal pain, blood in stool, constipation, diarrhea, heartburn, melena, nausea and vomiting.  Genitourinary: Negative for dysuria, flank pain, frequency, hematuria and urgency.  Musculoskeletal: Negative for back pain, joint pain and myalgias.  Skin: Negative for rash.  Neurological: Negative for dizziness, tingling, focal weakness, seizures, weakness and headaches.  Endo/Heme/Allergies: Does not bruise/bleed easily.  Psychiatric/Behavioral: Negative for depression and suicidal ideas. The patient does not  have insomnia.      Allergies  Allergen Reactions  . Penicillins Rash     Past Medical History:  Diagnosis Date  . Anemia   . Breast cancer ([place])   . Breast cyst, right    aspirated by Dr. Bary Castilla  . Hyperlipidemia   . Personal history of chemotherapy   . Pre-diabetes      Past Surgical History:  Procedure Laterality Date  . AXILLARY LYMPH NODE BIOPSY Right 04/09/2017   Procedure: AXILLARY LYMPH NODE BIOPSY;  Surgeon: Robert Bellow, MD;  Location: [facility] ORS;  Service: General;  Laterality: Right;  . BREAST BIOPSY Right 03/27/2017   US guided breast mass - invasive mammary carcinoma  . BREAST BIOPSY Right 03/27/2017   Lymph node - metastatic carcinoma  . BREAST BIOPSY Right 04/09/2017   Lymph node   . BREAST CYST ASPIRATION Right 11/2009   Dr. Bary Castilla did FNA  . COLONOSCOPY  2011  . DILATION AND CURETTAGE OF UTERUS     X3  . ENDOBRONCHIAL ULTRASOUND N/A 04/09/2017   Procedure: ENDOBRONCHIAL ULTRASOUND;  Surgeon: Laverle Hobby, MD;  Location: [facility] ORS;  Service: Pulmonary;  Laterality: N/A;  . ENDOMETRIAL ABLATION    . ENDOMETRIAL BIOPSY  09/2009  . PORTACATH PLACEMENT Left 04/09/2017   Procedure: INSERTION PORT-A-CATH;  Surgeon: Robert Bellow, MD;  Location: [facility] ORS;  Service: General;  Laterality: Left;    Social History   Socioeconomic History  . Marital status: Married    Spouse name: Not on file  . Number of children: Not on file  . Years of education: Not on file  . Highest education level: Not on file  Occupational History  . Not on file  Social Needs  . Financial resource strain: Not on file  . Food insecurity:    Worry: Not on file    Inability: Not on file  . Transportation needs:    Medical: Not on file    Non-medical: Not on file  Tobacco Use  . Smoking status: Never Smoker  . Smokeless tobacco: Never Used  Substance and Sexual Activity  . Alcohol use: Yes    Alcohol/week: 1.2 oz    Types: 2 Glasses of wine per week  . Drug  use: No  . Sexual activity: Not Currently  Lifestyle  . Physical activity:    Days per week: Not on file    Minutes per session: Not on file  . Stress: Not on file  Relationships  . Social connections:    Talks on phone: Not on file    Gets together: Not on file    Attends religious service: Not on file    Active member of club or organization: Not on file    Attends meetings of clubs or organizations: Not on file    Relationship status: Not on file  . Intimate partner violence:    Fear of current or ex partner: Not on file    Emotionally abused: Not on file    Physically abused: Not on file    Forced sexual activity: Not on file  Other Topics Concern  . Not on file  Social History Narrative  . Not on file    Family History  Problem Relation Age of Onset  . Melanoma Maternal Grandmother 68       currently 24  . Brain cancer Maternal Grandfather 41       unk. type; deceased in 50s  . Melanoma Other 21       mat grandmother's father  . Melanoma Father 40       on head; currently 72  . Prostate cancer Maternal Uncle        3 maternal uncles; dx in 72s  . Breast cancer Other        mat grandfather's sister; dx 14s     Current Outpatient Medications:  .  Biotin 2500 MCG CAPS, Take 5,000 mcg/day by mouth daily., Disp: , Rfl:  .  Cholecalciferol (VITAMIN D3) 10000 units TABS, Take by mouth 1 day or 1 dose., Disp: , Rfl:  .  Homeopathic Products (UMCKA COLDCARE PO), Take 1 pg/oz/day by mouth 3 (three) times daily., Disp: , Rfl:  .  loratadine (CLARITIN) 10 MG tablet, Take 10 mg by mouth daily., Disp: , Rfl:  .  LYCOPENE PO, Take by mouth., Disp: , Rfl:  .  Multiple Vitamins-Minerals (CENTRUM SILVER ADULT 50+) TABS, Take 1 tablet by mouth daily., Disp: , Rfl:  .  Multiple Vitamins-Minerals (EMERGEN-C IMMUNE PLUS PO), Take 1 packet by mouth 1 day or 1 dose. With energy, Disp: , Rfl:  .  omega-3 acid ethyl esters (LOVAZA) 1 g capsule, Take by mouth 1 day or 1 dose., Disp: ,  Rfl:  .  sodium-potassium bicarbonate (ALKA-SELTZER GOLD) TBEF dissolvable tablet, Take 1 tablet by mouth daily as needed., Disp: , Rfl:  No current facility-administered medications for this visit.   Facility-Administered Medications Ordered in Other Visits:  .  Tbo-Filgrastim (GRANIX) injection 480 mcg, 480 mcg, Subcutaneous, Daily, Sindy Guadeloupe, MD, 480 mcg at 05/19/17 1412  Physical exam:  Vitals:   10/02/17 1440 10/02/17 1447  BP:  103/68  Pulse:  90  Resp: 16  Temp:  99.1 F (37.3 C)  TempSrc:  Tympanic  Weight: 199 lb 1.6 oz (90.3 kg)   Height: 5' 3"  (1.6 m)    Physical Exam  Constitutional: She is oriented to person, place, and time.  HENT:  Head: Normocephalic and atraumatic.  Eyes: Pupils are equal, round, and reactive to light. EOM are normal.  Neck: Normal range of motion.  Cardiovascular: Normal rate, regular rhythm and normal heart sounds.  Pulmonary/Chest: Effort normal and breath sounds normal.  Abdominal: Soft. Bowel sounds are normal.  Neurological: She is alert and oriented to person, place, and time.  Skin: Skin is warm and dry.     CMP Latest Ref Rng & Units 07/17/2017  Glucose 65 - 99 mg/dL 107(H)  BUN 6 - 20 mg/dL 13  Creatinine 0.44 - 1.00 mg/dL 0.60  Sodium 135 - 145 mmol/L 138  Potassium 3.5 - 5.1 mmol/L 3.9  Chloride 101 - 111 mmol/L 109  CO2 22 - 32 mmol/L 23  Calcium 8.9 - 10.3 mg/dL 8.6(L)  Total Protein 6.5 - 8.1 g/dL 6.4(L)  Total Bilirubin 0.3 - 1.2 mg/dL 0.5  Alkaline Phos 38 - 126 U/L 111  AST 15 - 41 U/L 37  ALT 14 - 54 U/L 83(H)   CBC Latest Ref Rng & Units 07/17/2017  WBC 3.6 - 11.0 K/uL 3.6  Hemoglobin 12.0 - 16.0 g/dL 11.0(L)  Hematocrit 35.0 - 47.0 % 33.3(L)  Platelets 150 - 440 K/uL 186    No images are attached to the encounter.  Nm Pet Image Restag (ps) Skull Base To Thigh  Result Date: 09/21/2017 CLINICAL DATA:  Subsequent treatment strategy for breast cancer. EXAM: NUCLEAR MEDICINE PET SKULL BASE TO THIGH  TECHNIQUE: 10.9 mCi F-18 FDG was injected intravenously. Full-ring PET imaging was performed from the skull base to thigh after the radiotracer. CT data was obtained and used for attenuation correction and anatomic localization. Fasting blood glucose: 91 mg/dl COMPARISON:  07/20/2017 FINDINGS: Mediastinal blood pool activity: SUV max 2.8 NECK: No hypermetabolic lymph nodes in the neck. Incidental CT findings: none CHEST: Similar low level uptake in the right breast at the known lesion with SUV max = 2.6 compared to 2.1 previously. Lesion appears similar on CT imaging with irregular margins and no substantial change in size. Immediately lateral to this lesion is a stable homogeneous well-defined lesion compatible with cyst. Incidental CT findings: Left Port-A-Cath tip is positioned at the upper right atrium. Tiny right lower lobe pulmonary nodule (94/3) is stable. Compressive atelectasis noted in the lower lungs bilaterally. No pleural effusion. ABDOMEN/PELVIS: No abnormal hypermetabolic activity within the liver, pancreas, adrenal glands, or spleen. No hypermetabolic lymph nodes in the abdomen or pelvis. Incidental CT findings: Small paraumbilical hernia contains only fat. SKELETON: Persistent diffuse marrow uptake evident although qualitatively decreased in the interval. Incidental CT findings: Bone windows reveal no worrisome lytic or sclerotic osseous lesions. IMPRESSION: 1. Similar appearance and low level FDG accumulation in the patient's known right breast lesion. 2. No evidence for hypermetabolic lymphadenopathy in the neck, chest, abdomen, or pelvis. 3. Similar diffuse marrow uptake suggesting diffuse marrow stimulation. Electronically Signed   By: Misty Stanley M.D.   On: 09/21/2017 16:19   US Breast Limited Uni Right Inc Axilla  Result Date: 09/21/2017 CLINICAL DATA:  Post ug with the chemotherapy for right breast cancer diagnosed in October 2018. Assessment of neoadjuvant therapy effect. EXAM: DIGITAL  DIAGNOSTIC RIGHT MAMMOGRAM WITH CAD AND TOMO ULTRASOUND RIGHT BREAST COMPARISON:  Previous exam(s). ACR  Breast Density Category b: There are scattered areas of fibroglandular density. FINDINGS: Mammographically, there is a decreased in size ill-defined mass in the right breast upper outer quadrant, middle depth now measuring 3.8 x 4.7 x 2.0 cm mammographically. Adjacent with again seen is a round mass corresponding to a known benign cyst. There has been interval development of coarse heterogeneous calcifications within the known right breast malignancy. Mammographic images were processed with CAD. Targeted ultrasound is performed, showing right breast 10:30 o'clock 9 cm from the nipple ill-defined hypoechoic angulated mass which measures 1.2 x 3.1 x 4.1 cm (prior measurement on 03/23/2017 3.4 x 3.0 x 4.8 cm). No abnormal lymph nodes are seen in the right axilla. IMPRESSION: Interval decrease in size and density of the known right breast cancer. The largest sonographic measurement is 4.1 cm versus 4.8 cm on the ultrasound dated 03/23/2017. No abnormal lymph nodes are seen in the right axilla. RECOMMENDATION: Continue with plan of care for known right breast cancer. I have discussed the findings and recommendations with the patient. Results were also provided in writing at the conclusion of the visit. If applicable, a reminder letter will be sent to the patient regarding the next appointment. BI-RADS CATEGORY  6: Known biopsy-proven malignancy. Electronically Signed   By: Fidela Salisbury M.D.   On: 09/21/2017 14:37   Mm Diag Breast Tomo Uni Right  Result Date: 09/21/2017 CLINICAL DATA:  Post ug with the chemotherapy for right breast cancer diagnosed in October 2018. Assessment of neoadjuvant therapy effect. EXAM: DIGITAL DIAGNOSTIC RIGHT MAMMOGRAM WITH CAD AND TOMO ULTRASOUND RIGHT BREAST COMPARISON:  Previous exam(s). ACR Breast Density Category b: There are scattered areas of fibroglandular density.  FINDINGS: Mammographically, there is a decreased in size ill-defined mass in the right breast upper outer quadrant, middle depth now measuring 3.8 x 4.7 x 2.0 cm mammographically. Adjacent with again seen is a round mass corresponding to a known benign cyst. There has been interval development of coarse heterogeneous calcifications within the known right breast malignancy. Mammographic images were processed with CAD. Targeted ultrasound is performed, showing right breast 10:30 o'clock 9 cm from the nipple ill-defined hypoechoic angulated mass which measures 1.2 x 3.1 x 4.1 cm (prior measurement on 03/23/2017 3.4 x 3.0 x 4.8 cm). No abnormal lymph nodes are seen in the right axilla. IMPRESSION: Interval decrease in size and density of the known right breast cancer. The largest sonographic measurement is 4.1 cm versus 4.8 cm on the ultrasound dated 03/23/2017. No abnormal lymph nodes are seen in the right axilla. RECOMMENDATION: Continue with plan of care for known right breast cancer. I have discussed the findings and recommendations with the patient. Results were also provided in writing at the conclusion of the visit. If applicable, a reminder letter will be sent to the patient regarding the next appointment. BI-RADS CATEGORY  6: Known biopsy-proven malignancy. Electronically Signed   By: Fidela Salisbury M.D.   On: 09/21/2017 14:37     Assessment and plan- Patient is a 57 y.o. 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 Hb 8.4 and mild thrombocytopenia platelet count 134 today. Continue to monitor  Plan is for patient to undergo lumpectomy and [facility]. This will be followed by adjuvant RT to breast/ axilla and hilum.  She does have persistent disease in her right breast palpable and noted  on USG. No axillary or hilar  adenopathy. I will see her back 1st week of June after surgery to discuss final pathology and adjuvant xeloda which I plan to give after she completes adjuvant RT. Will check cbc/ cmp at that time   Visit Diagnosis 1. Malignant neoplasm of upper-outer quadrant of right breast in female, estrogen receptor negative ([place])   2. Anemia due to antineoplastic chemotherapy      Dr. Randa Evens, MD, MPH [facility] [phone] 10/05/2017 8:43 AM

## 2017-10-05 ENCOUNTER — Telehealth: Payer: Self-pay | Admitting: *Deleted

## 2017-10-05 NOTE — Telephone Encounter (Signed)
-----   Message from Sindy Guadeloupe, MD sent at 10/05/2017  8:44 AM EDT ----- Can we please check cbc, cmp for her last week of may at [place] before she sees me in June?  Thanks, Astrid Divine

## 2017-10-05 NOTE — Telephone Encounter (Signed)
Called pt and left message that Dr. Janese Banks would like to have labs drawn last week of May before she sees pt. In June. I have put the [facility] sheet in the mail to her.

## 2017-11-08 ENCOUNTER — Telehealth: Payer: Self-pay | Admitting: Pharmacist

## 2017-11-08 ENCOUNTER — Inpatient Hospital Stay: Payer: Managed Care, Other (non HMO) | Attending: Oncology | Admitting: Oncology

## 2017-11-08 ENCOUNTER — Encounter: Payer: Self-pay | Admitting: Oncology

## 2017-11-08 VITALS — BP 95/68 | HR 78 | Temp 97.8°F | Resp 18 | Ht 63.0 in | Wt 200.1 lb

## 2017-11-08 DIAGNOSIS — D709 Neutropenia, unspecified: Secondary | ICD-10-CM | POA: Insufficient documentation

## 2017-11-08 DIAGNOSIS — Z9221 Personal history of antineoplastic chemotherapy: Secondary | ICD-10-CM | POA: Insufficient documentation

## 2017-11-08 DIAGNOSIS — C778 Secondary and unspecified malignant neoplasm of lymph nodes of multiple regions: Secondary | ICD-10-CM | POA: Diagnosis not present

## 2017-11-08 DIAGNOSIS — C50911 Malignant neoplasm of unspecified site of right female breast: Secondary | ICD-10-CM

## 2017-11-08 DIAGNOSIS — Z7189 Other specified counseling: Secondary | ICD-10-CM

## 2017-11-08 DIAGNOSIS — D6481 Anemia due to antineoplastic chemotherapy: Secondary | ICD-10-CM | POA: Diagnosis not present

## 2017-11-08 DIAGNOSIS — Z170 Estrogen receptor positive status [ER+]: Secondary | ICD-10-CM | POA: Diagnosis not present

## 2017-11-08 DIAGNOSIS — C7800 Secondary malignant neoplasm of unspecified lung: Principal | ICD-10-CM

## 2017-11-08 MED ORDER — CAPECITABINE 500 MG PO TABS: 1000.0000 mg/m2 | ORAL_TABLET | Freq: Two times a day (BID) | ORAL | 0 refills | Status: DC

## 2017-11-08 NOTE — Progress Notes (Signed)
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 Patient is a 57 year old postmenopausal female who had a routine screening mammogram on 03/23/2017 which showed a highly suspicious palpable solid mass in the 10 o'clock position. Mass measures at least 5 cm in maximum diameter for right axillary lymph nodes have sonographic features suspicious for metastatic disease. No evidence of malignancy was noted in the left breast. This was followed by an ultrasound of the right breast including the right axilla which confirmed the same findings.Targeted ultrasound is performed, showing a markedly irregular and hypoechoic mass centered at 10 o'clock position approximately 7 cm from the nipple estimated to be at least 5.2 x 4.4 x 3.4 cm. Small areas of vascular flow are detected within the mass. At 10 o'clock position 9 cm from the nipple again noted is a simple cyst measuring approximately 2.4 cm greatest diameter. Skin thickness of the medial and lateral of right breast measures approximately 4 mm.  Ultrasound of the right axilla demonstrates 4 suspicious lymph nodes with hypoechoic thickened cortices. The largest of these lymph nodes measures 2.2 x 1.2 x 1.0 cm and has a cortical thickness of 4-5 mm.  Patient underwent core biopsy of the right breast mass.  2. Biopsy showed 15 mm invasive carcinoma, grade  3. Right axillary lymph node biopsy was also consistent with metastatic carcinoma. ERnegativePR5% positive,and HER-2negative by FISH  3. Patient is G3P3L3. Menarche at the age of 57. Menopause at 57. No priro h/o breast biopsies. No family h/o breast cancer. She was known to have right breast cyst being followed by Dr. Bary Castilla. She noticed this amss sometime over the summer and feels this has grown since then. She is healthy and has no significant co-morbidities  4. PET/CT scan on 04/04/17 showed:IMPRESSION: 1. Hypermetabolic right breast mass with hypermetabolic lymph nodes in the right axilla and hypermetabolic metastatic disease in both hilar regions and subcarinal mediastinum. 2. No evidence for hypermetabolic metastatic disease in the neck, abdomen or pelvis  5. Patient underwent bronchoscopy and biopsy of hilar LN which was positive for metastatic carcinoma from the breast. She also underwent RUl biopsy and bronchial washings that were negative for malignancy.Baseline CA-15-3 normal at 16.9 And CA-27-29 normal at 15.8. Patient seen by Dr. Alinda Money at [facility] and plan was to proceed with carbo/taxol followed by pet and if she has a good response proceed to dd [facility] X4 and possible surgery and RT down the line  6.Patient has had significant neutropenia despite dose reduction of carboplatin and adding Neupogen requiring dose interruptions. Plan wastherefore to switch her to every 2 weekly cycle of carbotaxol for the remainder of cycles with on pro-Neulasta support which she completed on 07/17/17  7. PET scan after carbo/taxol showed:Marland Kitchen Interval decrease in size of the right breast mass and significant reduction in hypermetabolism. 2. Resolution of right axillary adenopathy and mediastinal and hilar adenopathy. 3. No findings for metastatic disease involving the abdomen/pelvis. 4. Diffuse osseous hypermetabolism likely due to rebound from chemotherapy  or marrow stimulating drugs.  8.  Right breast US showed:Ultrasound examination of the upper outer quadrant of the right breast to reassess tumor response was completed. A dominant cyst remains in the 10 o'clock position, 10 cm from the nipple. Immediately adjacent to this at the 1030 o'clock position is the primary tumor mass now measuring 1.7 x 2.7 x 4.0 cm. This area previously measured 3.7 x 3.7 x 7.2 cm. This represents an approximately 80% decrease in volume. A single mildly prominent right axillary node measuring 0.6 x 0.7 cm with internal vascular flow is noted. BI-RADS-6.Seen by Dr. Alinda Money on 08/13/17 who recommends continuing 4 cycles of dd AC followed by PET and then possible surgery/ RT  9. Patient seen by [facility] team at [facility]. Plan is to proceed with definitive surgery- right lumpectomy and ALND followed by adjuvant RT including RT to the hilar region.  10. Patient underwent Right wire localized partial mastectomy, tissue rearrangement, axillary lymph node dissection and axillary reverse mapping, performed on 10/23/2017. Final pathology showed 16 mm residual tumor. 0/12 Ln positive for malignancy. Grade 2. Negative margins. No LVI. ER, PR and her 2 neu negative  Interval history- her surgical drains are out and she is recovering well after surgery. Denies any complaints today  ECOG PS- 0 Pain scale- 0   Review of systems- Review of Systems  Constitutional: Negative for chills, fever, malaise/fatigue and weight loss.  HENT: Negative for congestion, ear discharge and nosebleeds.   Eyes: Negative for blurred vision.  Respiratory: Negative for cough, hemoptysis, sputum production, shortness of breath and wheezing.   Cardiovascular: Negative for chest pain, palpitations, orthopnea and claudication.  Gastrointestinal: Negative for abdominal pain, blood in stool, constipation, diarrhea, heartburn, melena, nausea and vomiting.  Genitourinary: Negative for dysuria, flank pain, frequency, hematuria and urgency.    Musculoskeletal: Negative for back pain, joint pain and myalgias.  Skin: Negative for rash.  Neurological: Negative for dizziness, tingling, focal weakness, seizures, weakness and headaches.  Endo/Heme/Allergies: Does not bruise/bleed easily.  Psychiatric/Behavioral: Negative for depression and suicidal ideas. The patient does not have insomnia.       Allergies  Allergen Reactions  . Penicillins Rash     Past Medical History:  Diagnosis Date  . Anemia   . Breast cancer ([place])   . Breast cyst, right    aspirated by Dr. Bary Castilla  . Hyperlipidemia   . Personal history of chemotherapy   . Pre-diabetes      Past Surgical History:  Procedure Laterality Date  . AXILLARY LYMPH NODE BIOPSY Right 04/09/2017   Procedure: AXILLARY LYMPH NODE BIOPSY;  Surgeon: Robert Bellow, MD;  Location: [facility] ORS;  Service: General;  Laterality: Right;  . BREAST BIOPSY Right 03/27/2017   US guided breast mass - invasive mammary carcinoma  . BREAST BIOPSY Right 03/27/2017   Lymph node - metastatic carcinoma  . BREAST BIOPSY Right 04/09/2017   Lymph node   . BREAST CYST ASPIRATION Right 11/2009   Dr. Bary Castilla did FNA  . COLONOSCOPY  2011  . DILATION AND CURETTAGE OF UTERUS     X3  . ENDOBRONCHIAL ULTRASOUND N/A 04/09/2017   Procedure: ENDOBRONCHIAL ULTRASOUND;  Surgeon: Laverle Hobby, MD;  Location: [facility] ORS;  Service: Pulmonary;  Laterality: N/A;  . ENDOMETRIAL ABLATION    . ENDOMETRIAL BIOPSY  09/2009  . PORTACATH PLACEMENT Left 04/09/2017   Procedure: INSERTION PORT-A-CATH;  Surgeon: Robert Bellow, MD;  Location: [facility] ORS;  Service: General;  Laterality: Left;    Social  History   Socioeconomic History  . Marital status: Married    Spouse name: Not on file  . Number of children: Not on file  . Years of education: Not on file  . Highest education level: Not on file  Occupational History  . Not on file  Social Needs  . Financial resource strain: Not on file  . Food  insecurity:    Worry: Not on file    Inability: Not on file  . Transportation needs:    Medical: Not on file    Non-medical: Not on file  Tobacco Use  . Smoking status: Never Smoker  . Smokeless tobacco: Never Used  Substance and Sexual Activity  . Alcohol use: Yes    Alcohol/week: 1.2 oz    Types: 2 Glasses of wine per week  . Drug use: No  . Sexual activity: Not Currently  Lifestyle  . Physical activity:    Days per week: Not on file    Minutes per session: Not on file  . Stress: Not on file  Relationships  . Social connections:    Talks on phone: Not on file    Gets together: Not on file    Attends religious service: Not on file    Active member of club or organization: Not on file    Attends meetings of clubs or organizations: Not on file    Relationship status: Not on file  . Intimate partner violence:    Fear of current or ex partner: Not on file    Emotionally abused: Not on file    Physically abused: Not on file    Forced sexual activity: Not on file  Other Topics Concern  . Not on file  Social History Narrative  . Not on file    Family History  Problem Relation Age of Onset  . Melanoma Maternal Grandmother 34       currently 9  . Brain cancer Maternal Grandfather 30       unk. type; deceased in 77s  . Melanoma Other 75       mat grandmother's father  . Melanoma Father 78       on head; currently 47  . Prostate cancer Maternal Uncle        3 maternal uncles; dx in 43s  . Breast cancer Other        mat grandfather's sister; dx 51s     Current Outpatient Medications:  .  Biotin 2500 MCG CAPS, Take 5,000 mcg/day by mouth daily., Disp: , Rfl:  .  Cholecalciferol (VITAMIN D3) 10000 units TABS, Take by mouth 1 day or 1 dose., Disp: , Rfl:  .  Homeopathic Products (UMCKA COLDCARE PO), Take 1 pg/oz/day by mouth 3 (three) times daily., Disp: , Rfl:  .  loratadine (CLARITIN) 10 MG tablet, Take 10 mg by mouth daily., Disp: , Rfl:  .  LYCOPENE PO, Take by  mouth., Disp: , Rfl:  .  Multiple Vitamins-Minerals (CENTRUM SILVER ADULT 50+) TABS, Take 1 tablet by mouth daily., Disp: , Rfl:  .  Multiple Vitamins-Minerals (EMERGEN-C IMMUNE PLUS PO), Take 1 packet by mouth 1 day or 1 dose. With energy, Disp: , Rfl:  .  omega-3 acid ethyl esters (LOVAZA) 1 g capsule, Take by mouth 1 day or 1 dose., Disp: , Rfl:  .  sodium-potassium bicarbonate (ALKA-SELTZER GOLD) TBEF dissolvable tablet, Take 1 tablet by mouth daily as needed., Disp: , Rfl:  No current facility-administered medications for this visit.  Facility-Administered Medications Ordered in Other Visits:  .  Tbo-Filgrastim (GRANIX) injection 480 mcg, 480 mcg, Subcutaneous, Daily, Sindy Guadeloupe, MD, 480 mcg at 05/19/17 1412  Physical exam:  Vitals:   11/08/17 0930  BP: 95/68  Pulse: 78  Resp: 18  Temp: 97.8 F (36.6 C)  TempSrc: Tympanic  Weight: 200 lb 1.6 oz (90.8 kg)  Height: _0  (1.6 m)   Physical Exam  Constitutional: She is oriented to person, place, and time. She appears well-developed and well-nourished.  HENT:  Head: Normocephalic and atraumatic.  Eyes: Pupils are equal, round, and reactive to light. EOM are normal.  Neck: Normal range of motion.  Cardiovascular: Normal rate, regular rhythm and normal heart sounds.  Pulmonary/Chest: Effort normal and breath sounds normal.  Abdominal: Soft. Bowel sounds are normal.  Neurological: She is alert and oriented to person, place, and time.  Skin: Skin is warm and dry.   Breast exam was performed in seated and lying down position. Patient is status post right lumpectomy with a well-healed surgical scar.    CMP Latest Ref Rng & Units 07/17/2017  Glucose 65 - 99 mg/dL 107(H)  BUN 6 - 20 mg/dL 13  Creatinine 0.44 - 1.00 mg/dL 0.60  Sodium 135 - 145 mmol/L 138  Potassium 3.5 - 5.1 mmol/L 3.9  Chloride 101 - 111 mmol/L 109  CO2 22 - 32 mmol/L 23  Calcium 8.9 - 10.3 mg/dL 8.6(L)  Total Protein 6.5 - 8.1 g/dL 6.4(L)  Total  Bilirubin 0.3 - 1.2 mg/dL 0.5  Alkaline Phos 38 - 126 U/L 111  AST 15 - 41 U/L 37  ALT 14 - 54 U/L 83(H)   CBC Latest Ref Rng & Units 07/17/2017  WBC 3.6 - 11.0 K/uL 3.6  Hemoglobin 12.0 - 16.0 g/dL 11.0(L)  Hematocrit 35.0 - 47.0 % 33.3(L)  Platelets 150 - 440 K/uL 186     Assessment and plan- Patient is a 57 y.o. 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 Overall she has responded very well to neoadjuvant chemotherapy. She did however have 16 mm residual primary disease, no LN involvement. She has seen radiation oncology at [facility] and will be starting RT to right breast, axillary and hilar LN in 2- 3 weeks time which will go on for 5-6 weeks  I will see her roughly 10 weeks from now. Get PET/CT prior.  I recommend adjuvant xeloda at 1000 mg/meter square BID 2 weeks on and 1 week off for 8 cycles upon completion of RT as per CREATE-X trial. Discussed risks and benefits of Xeloda including all but not limited to nausea, vomiting, fatigue, skin rash and hand-foot syndrome as well as diarrhea.  Patient understands and agrees to proceed.  We will work on Air traffic controller for Xeloda which will tentatively start 10 weeks from now although patient has metastatic disease to her hilar lymph node since this is the only site of disease we are treating her with a potentially curative intent  Chemo induced anemia: improved from 8.9 to 10.9 today. Her wbc is 2.4 and she is mildly neutropenic with ANC of 1.3. This should improve over next few weeks   Total face to face encounter time for this patient visit was 45 min. >50% of the time was  spent in counseling and coordination of care.  Visit Diagnosis 1. 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 Goals of care,  counseling/discussion      Dr. Randa Evens, MD, MPH [facility] [phone] 11/08/2017 12:08 PM

## 2017-11-08 NOTE — Progress Notes (Signed)
Having a little discomfort from surgery but nothing new

## 2017-11-13 ENCOUNTER — Telehealth: Payer: Self-pay | Admitting: Pharmacy Technician

## 2017-11-13 MED ORDER — CAPECITABINE 500 MG PO TABS: 1000.0000 mg/m2 | ORAL_TABLET | Freq: Two times a day (BID) | ORAL | 1 refills | Status: DC

## 2017-11-13 NOTE — Telephone Encounter (Signed)
Oral Oncology Pharmacist Encounter  Received new prescription for Xeloda (capecitabine) for the treatment of Stage IV triple negative breast cancer, planned duration eight 3 week cycles.  CMP from 09/03/17 and CBC from 09/17/17 assessed, no relevant lab abnormalities. Prescription dose and frequency assessed.   Current medication list in Epic reviewed, no relevant DDIs with Xeloda identified.  Prescription has been e-scribed to the [facility] for benefits analysis and approval.  Patient education Counseled patient on administration, dosing, side effects, monitoring, drug-food interactions, safe handling, storage, and disposal. Patient will take 4 tablets (2,000 mg total) by mouth 2 (two) times daily after a meal.  Side effects include but not limited to: Decreased wbc/plt/hgb, hand-foot syndrome, diarrhea, N/V, fatigue.    Reviewed with patient importance of keeping a medication schedule and plan for any missed doses.  Ashley Molina voiced understanding and appreciation. All questions answered. Medication handout provided and consent obtained.   Oral Oncology Clinic will continue to follow for insurance authorization, copayment issues, and start date.  Provided patient with [facility] [place] [facility] phone number. Patient knows to call the office with questions or concerns. Oral Chemotherapy Navigation Clinic will continue to follow.  Darl Pikes, PharmD, BCPS Hematology/Oncology Clinical Pharmacist [facility] [place] [facility] [phone]  11/13/2017 10:49 AM

## 2017-11-13 NOTE — Telephone Encounter (Signed)
Oral Oncology Patient Advocate Encounter  Received notification from [employer] that prior authorization for Xeloda is required.  PA submitted on CoverMyMeds Key [id number] Status is pending  Oral Oncology Clinic will continue to follow.  [place] Patient [place] Phone [phone] Fax [phone] 11/13/2017 11:43 AM

## 2017-11-13 NOTE — Telephone Encounter (Signed)
Oral Oncology Patient Advocate Encounter  Prior Authorization for XELODA has been approved.    PA# [id number]  Effective dates: 11/13/2017 through 11/14/2018.  Oral Oncology Clinic will continue to follow.   [place] Patient [place] Phone [phone] Fax [phone] 11/13/2017 12:09 PM

## 2017-11-13 NOTE — Telephone Encounter (Signed)
Oral Chemotherapy Pharmacist Encounter   Due to insurance restrictions, Ms. Lichtenwalner's Xeloda needs to be filled through [employer]. Rx will be sent [place] along with supportive information.   Darl Pikes, PharmD, BCPS Hematology/Oncology Clinical Pharmacist [facility] [place] [facility] [phone]  11/13/2017 12:41 PM\

## 2018-01-03 ENCOUNTER — Encounter: Payer: Self-pay | Admitting: Oncology

## 2018-01-03 ENCOUNTER — Telehealth: Payer: Self-pay | Admitting: *Deleted

## 2018-01-03 NOTE — Telephone Encounter (Signed)
Patient called and states she is currently undergoing radiation therapy at [facility] and we have her scheduled for PET scan 8/13. She does not complete radiation therapy until 8/8 and the radiation physician told her it would be best to wait for 6  8 weeks after completion of radiation therapy for her to have the PET scan. Please advise. Her call back is [phone]

## 2018-01-03 NOTE — Telephone Encounter (Signed)
Ok we will reschedule her pet. But she would still need to see me as scheduled to start xeloda.   Verdis Frederickson- can you please reschedule PET to 10/1/  Thanks, Astrid Divine

## 2018-01-03 NOTE — Telephone Encounter (Signed)
Pinformed that scheduler will be calling her with new date and time for PET Scan and to keep appointment 8/16 with Dr Janese Banks as she needs to get her started on Xeloda. She replied"I am on board with that plan"

## 2018-01-08 ENCOUNTER — Telehealth: Payer: Self-pay | Admitting: *Deleted

## 2018-01-08 NOTE — Telephone Encounter (Signed)
Called the patient and asked her if she saw the change in her pet scan appt. She said yes. And this if perfect. She just finished her radiation and will see [place] next week.

## 2018-01-10 ENCOUNTER — Inpatient Hospital Stay: Payer: Managed Care, Other (non HMO) | Attending: Oncology

## 2018-01-10 DIAGNOSIS — C50411 Malignant neoplasm of upper-outer quadrant of right female breast: Secondary | ICD-10-CM | POA: Insufficient documentation

## 2018-01-10 DIAGNOSIS — Z923 Personal history of irradiation: Secondary | ICD-10-CM | POA: Diagnosis not present

## 2018-01-10 DIAGNOSIS — Z171 Estrogen receptor negative status [ER-]: Secondary | ICD-10-CM | POA: Insufficient documentation

## 2018-01-10 DIAGNOSIS — Z452 Encounter for adjustment and management of vascular access device: Secondary | ICD-10-CM | POA: Diagnosis present

## 2018-01-10 DIAGNOSIS — C50911 Malignant neoplasm of unspecified site of right female breast: Secondary | ICD-10-CM

## 2018-01-10 DIAGNOSIS — Z79899 Other long term (current) drug therapy: Secondary | ICD-10-CM | POA: Diagnosis not present

## 2018-01-10 DIAGNOSIS — C773 Secondary and unspecified malignant neoplasm of axilla and upper limb lymph nodes: Secondary | ICD-10-CM | POA: Diagnosis not present

## 2018-01-10 DIAGNOSIS — C7800 Secondary malignant neoplasm of unspecified lung: Secondary | ICD-10-CM

## 2018-01-10 DIAGNOSIS — D709 Neutropenia, unspecified: Secondary | ICD-10-CM | POA: Insufficient documentation

## 2018-01-10 DIAGNOSIS — L309 Dermatitis, unspecified: Secondary | ICD-10-CM | POA: Diagnosis not present

## 2018-01-10 MED ORDER — HEPARIN SOD (PORK) LOCK FLUSH 100 UNIT/ML IV SOLN
500.0000 [IU] | Freq: Once | INTRAVENOUS | Status: AC
  Administered 2018-01-10: 500 [IU] via INTRAVENOUS

## 2018-01-10 MED ORDER — SODIUM CHLORIDE 0.9% FLUSH
10.0000 mL | INTRAVENOUS | Status: DC | PRN
  Administered 2018-01-10: 10 mL via INTRAVENOUS
  Filled 2018-01-10: qty 10

## 2018-01-15 ENCOUNTER — Ambulatory Visit: Payer: Managed Care, Other (non HMO)

## 2018-01-18 ENCOUNTER — Encounter: Payer: Self-pay | Admitting: Oncology

## 2018-01-18 ENCOUNTER — Inpatient Hospital Stay (HOSPITAL_BASED_OUTPATIENT_CLINIC_OR_DEPARTMENT_OTHER): Payer: Managed Care, Other (non HMO) | Admitting: Oncology

## 2018-01-18 ENCOUNTER — Telehealth: Payer: Self-pay | Admitting: Pharmacist

## 2018-01-18 ENCOUNTER — Telehealth: Payer: Self-pay | Admitting: *Deleted

## 2018-01-18 VITALS — BP 96/67 | HR 96 | Temp 97.3°F | Resp 18 | Ht 63.0 in | Wt 195.7 lb

## 2018-01-18 DIAGNOSIS — C50411 Malignant neoplasm of upper-outer quadrant of right female breast: Secondary | ICD-10-CM

## 2018-01-18 DIAGNOSIS — C773 Secondary and unspecified malignant neoplasm of axilla and upper limb lymph nodes: Secondary | ICD-10-CM

## 2018-01-18 DIAGNOSIS — D709 Neutropenia, unspecified: Secondary | ICD-10-CM

## 2018-01-18 DIAGNOSIS — Z171 Estrogen receptor negative status [ER-]: Secondary | ICD-10-CM

## 2018-01-18 DIAGNOSIS — Z79899 Other long term (current) drug therapy: Secondary | ICD-10-CM

## 2018-01-18 DIAGNOSIS — L309 Dermatitis, unspecified: Secondary | ICD-10-CM

## 2018-01-18 DIAGNOSIS — Z923 Personal history of irradiation: Secondary | ICD-10-CM

## 2018-01-18 MED ORDER — CAPECITABINE 500 MG PO TABS: 750.0000 mg/m2 | ORAL_TABLET | Freq: Two times a day (BID) | ORAL | 2 refills | Status: DC

## 2018-01-18 NOTE — Telephone Encounter (Signed)
Called Ashley Molina to let her know that Dr. Janese Banks did speak with Dr. Alinda Money and he said that you should start the xeloda 3 tablets twice a day for 1 week on and then 1 week off. Alyson the pharmacist made the change and drug should come mon or tues and I would like the patient to call me when she starts the medication. I have made her an appt. For 8/30 at 2:45 to see dr. Janese Banks and then if she can have labs 1-2 days prior to the appt. She will come and pick up the form one day next week and she is agreeable to the plan

## 2018-01-18 NOTE — Progress Notes (Signed)
No new changes noted today 

## 2018-01-21 ENCOUNTER — Telehealth: Payer: Self-pay | Admitting: *Deleted

## 2018-01-21 ENCOUNTER — Other Ambulatory Visit: Payer: Self-pay | Admitting: *Deleted

## 2018-01-21 DIAGNOSIS — Z171 Estrogen receptor negative status [ER-]: Principal | ICD-10-CM

## 2018-01-21 DIAGNOSIS — C50411 Malignant neoplasm of upper-outer quadrant of right female breast: Secondary | ICD-10-CM

## 2018-01-21 NOTE — Telephone Encounter (Signed)
I called pt to tell her that met c that was drawn on Friday had leakage on way of transit to get tested so therefore pt unable have results and needs repeat. She states she will come over to hospital tom. Am and get labs drawn. Orders in and appt given fot tom. Am and she is agreeable to plan

## 2018-01-21 NOTE — Progress Notes (Signed)
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 Patient is a 57 year old postmenopausal female who had a routine screening mammogram on 03/23/2017 which showed a highly suspicious palpable solid mass in the 10 o'clock position. Mass measures at least 5 cm in maximum diameter for right axillary lymph nodes have sonographic features suspicious for metastatic disease. No evidence of malignancy was noted in the left breast. This was followed by an ultrasound of the right breast including the right axilla which confirmed the same findings.Targeted ultrasound is performed, showing a markedly irregular and hypoechoic mass centered at 10 o'clock position approximately 7 cm from the nipple estimated to be at least 5.2 x 4.4 x 3.4 cm. Small areas of vascular flow are detected within the mass. At 10 o'clock position 9 cm from the nipple again noted is a simple cyst measuring approximately 2.4 cm greatest diameter. Skin thickness of the medial and lateral of right breast measures approximately 4 mm.  Ultrasound of the right axilla demonstrates 4 suspicious lymph nodes with hypoechoic thickened cortices. The largest of these lymph nodes measures 2.2 x 1.2 x 1.0 cm and has a cortical thickness of 4-5 mm.  Patient underwent core biopsy of the right breast mass.  2. 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 Right axillary  lymph node biopsy was also consistent with metastatic carcinoma. ERnegativePR5% positive,and HER-2negative by FISH  3. Patient is G3P3L3. Menarche at the age of 31. Menopause at 18. No priro h/o breast biopsies. No family h/o breast cancer. She was known to have right breast cyst being followed by Dr. Bary Castilla. She noticed this amss sometime over the summer and feels this has grown since then. She is healthy and has no significant co-morbidities  4. PET/CT scan on 04/04/17 showed:IMPRESSION: 1. Hypermetabolic right breast mass with hypermetabolic lymph nodes in the right axilla and hypermetabolic metastatic disease in both hilar regions and subcarinal mediastinum. 2. No evidence for hypermetabolic metastatic disease in the neck, abdomen or pelvis  5. Patient underwent bronchoscopy and biopsy of hilar LN which was positive for metastatic carcinoma from the breast. She also underwent RUl biopsy and bronchial washings that were negative for malignancy.Baseline CA-15-3 normal at 16.9 And CA-27-29 normal at 15.8. Patient seen by Dr. Alinda Money at [facility] and plan was to proceed with carbo/taxol followed by pet and if she has a good response proceed to dd [facility] X4 and possible surgery and RT down the line  6.Patient has had significant neutropenia despite dose reduction of carboplatin and adding Neupogen requiring dose interruptions. Plan wastherefore to switch her to every 2 weekly cycle of carbotaxol for the remainder of cycles with on pro-Neulasta support which she completed on 07/17/17  7. PET scan after carbo/taxol showed:Marland Kitchen Interval decrease in size of the right breast mass and significant reduction in hypermetabolism. 2. Resolution of right axillary adenopathy and mediastinal and hilar adenopathy. 3. No findings for metastatic disease involving the abdomen/pelvis. 4. Diffuse osseous hypermetabolism likely due to rebound from chemotherapy or marrow  stimulating drugs.  8. Right breast US  showed:Ultrasound examination of the upper outer quadrant of the right breast to reassess tumor response was completed. A dominant cyst remains in the 10 o'clock position, 10 cm from the nipple. Immediately adjacent to this at the 1030 o'clock position is the primary tumor mass now measuring 1.7 x 2.7 x 4.0 cm. This area previously measured 3.7 x 3.7 x 7.2 cm. This represents an approximately 80% decrease in volume. A single mildly prominent right axillary node measuring 0.6 x 0.7 cm with internal vascular flow is noted. BI-RADS-6.Seen by Dr. Alinda Money on 08/13/17 who recommends continuing 4 cycles of dd AC followed by PET and then possible surgery/ RT  9. Patient seen by [facility] team at [facility]. Plan is to proceed with definitive surgery- right lumpectomy and ALND followed by adjuvant RT including RT to the hilar region.  10. Patient underwent Right wire localized partial mastectomy, tissue rearrangement, axillary lymph node dissection and axillary reverse mapping, performed on 10/23/2017. Final pathology showed 16 mm residual tumor. 0/12 Ln positive for malignancy. Grade 2. Negative margins. No LVI. ER, PR and her 2 neu negative.  She also completed radiation therapy to her chest wall and hilar region at in August 2019  Interval history-she is recovering well from her recent radiation therapy.  She does have some dermatitis over the skin of her chest wall but denies other complaints.  Her energy levels are good and so is her appetite.  Denies any unintentional weight loss  ECOG PS- 0 Pain scale- 0   Review of systems- Review of Systems  Constitutional: Negative for chills, fever, malaise/fatigue and weight loss.  HENT: Negative for congestion, ear discharge and nosebleeds.   Eyes: Negative for blurred vision.  Respiratory: Negative for cough, hemoptysis, sputum production, shortness of breath and wheezing.   Cardiovascular: Negative for chest pain, palpitations, orthopnea and claudication.    Gastrointestinal: Negative for abdominal pain, blood in stool, constipation, diarrhea, heartburn, melena, nausea and vomiting.  Genitourinary: Negative for dysuria, flank pain, frequency, hematuria and urgency.  Musculoskeletal: Negative for back pain, joint pain and myalgias.  Skin: Negative for rash.  Neurological: Negative for dizziness, tingling, focal weakness, seizures, weakness and headaches.  Endo/Heme/Allergies: Does not bruise/bleed easily.  Psychiatric/Behavioral: Negative for depression and suicidal ideas. The patient does not have insomnia.       Allergies  Allergen Reactions  . Penicillins Rash     Past Medical History:  Diagnosis Date  . Anemia   . Breast cancer ([place])   . Breast cyst, right    aspirated by Dr. Bary Castilla  . Hyperlipidemia   . Personal history of chemotherapy   . Pre-diabetes      Past Surgical History:  Procedure Laterality Date  . AXILLARY LYMPH NODE BIOPSY Right 04/09/2017   Procedure: AXILLARY LYMPH NODE BIOPSY;  Surgeon: Robert Bellow, MD;  Location: [facility] ORS;  Service: General;  Laterality: Right;  . BREAST BIOPSY Right 03/27/2017   US guided breast mass - invasive mammary carcinoma  . BREAST BIOPSY Right 03/27/2017   Lymph node - metastatic carcinoma  . BREAST BIOPSY Right 04/09/2017   Lymph node   . BREAST CYST ASPIRATION Right 11/2009   Dr. Bary Castilla did FNA  . COLONOSCOPY  2011  . DILATION AND CURETTAGE OF UTERUS     X3  . ENDOBRONCHIAL ULTRASOUND N/A 04/09/2017   Procedure: ENDOBRONCHIAL ULTRASOUND;  Surgeon: Laverle Hobby, MD;  Location: [facility] ORS;  Service: Pulmonary;  Laterality: N/A;  .  ENDOMETRIAL ABLATION    . ENDOMETRIAL BIOPSY  09/2009  . PORTACATH PLACEMENT Left 04/09/2017   Procedure: INSERTION PORT-A-CATH;  Surgeon: Robert Bellow, MD;  Location: [facility] ORS;  Service: General;  Laterality: Left;    Social History   Socioeconomic History  . Marital status: Married    Spouse name: Not on file  . Number  of children: Not on file  . Years of education: Not on file  . Highest education level: Not on file  Occupational History  . Not on file  Social Needs  . Financial resource strain: Not on file  . Food insecurity:    Worry: Not on file    Inability: Not on file  . Transportation needs:    Medical: Not on file    Non-medical: Not on file  Tobacco Use  . Smoking status: Never Smoker  . Smokeless tobacco: Never Used  Substance and Sexual Activity  . Alcohol use: Yes    Alcohol/week: 2.0 standard drinks    Types: 2 Glasses of wine per week  . Drug use: No  . Sexual activity: Not Currently  Lifestyle  . Physical activity:    Days per week: Not on file    Minutes per session: Not on file  . Stress: Not on file  Relationships  . Social connections:    Talks on phone: Not on file    Gets together: Not on file    Attends religious service: Not on file    Active member of club or organization: Not on file    Attends meetings of clubs or organizations: Not on file    Relationship status: Not on file  . Intimate partner violence:    Fear of current or ex partner: Not on file    Emotionally abused: Not on file    Physically abused: Not on file    Forced sexual activity: Not on file  Other Topics Concern  . Not on file  Social History Narrative  . Not on file    Family History  Problem Relation Age of Onset  . Melanoma Maternal Grandmother 41       currently 69  . Brain cancer Maternal Grandfather 1       unk. type; deceased in 42s  . Melanoma Other 58       mat grandmother's father  . Melanoma Father 32       on head; currently 49  . Prostate cancer Maternal Uncle        3 maternal uncles; dx in 51s  . Breast cancer Other        mat grandfather's sister; dx 5s     Current Outpatient Medications:  .  Biotin 2500 MCG CAPS, Take 5,000 mcg/day by mouth daily., Disp: , Rfl:  .  capecitabine (XELODA) 500 MG tablet, Take 3 tablets (1,500 mg total) by mouth 2 (two) times  daily after a meal. Take for 1 week on, then 1 week off, Disp: 84 tablet, Rfl: 2 .  Cholecalciferol (VITAMIN D3) 10000 units TABS, Take by mouth 1 day or 1 dose., Disp: , Rfl:  .  Homeopathic Products (UMCKA COLDCARE PO), Take 1 pg/oz/day by mouth 3 (three) times daily., Disp: , Rfl:  .  loratadine (CLARITIN) 10 MG tablet, Take 10 mg by mouth daily., Disp: , Rfl:  .  LYCOPENE PO, Take by mouth., Disp: , Rfl:  .  Multiple Vitamins-Minerals (CENTRUM SILVER ADULT 50+) TABS, Take 1 tablet by mouth daily., Disp: ,  Rfl:  .  Multiple Vitamins-Minerals (EMERGEN-C IMMUNE PLUS PO), Take 1 packet by mouth 1 day or 1 dose. With energy, Disp: , Rfl:  .  omega-3 acid ethyl esters (LOVAZA) 1 g capsule, Take by mouth 1 day or 1 dose., Disp: , Rfl:  .  sodium-potassium bicarbonate (ALKA-SELTZER GOLD) TBEF dissolvable tablet, Take 1 tablet by mouth daily as needed., Disp: , Rfl:  No current facility-administered medications for this visit.   Facility-Administered Medications Ordered in Other Visits:  .  Tbo-Filgrastim (GRANIX) injection 480 mcg, 480 mcg, Subcutaneous, Daily, Sindy Guadeloupe, MD, 480 mcg at 05/19/17 1412  Physical exam:  Vitals:   01/18/18 1441  BP: 96/67  Pulse: 96  Resp: 18  Temp: (!) 97.3 F (36.3 C)  TempSrc: Tympanic  SpO2: 95%  Weight: 195 lb 11.2 oz (88.8 kg)  Height: _0  (1.6 m)   Physical Exam  Constitutional: She is oriented to person, place, and time. She appears well-developed and well-nourished.  HENT:  Head: Normocephalic and atraumatic.  Eyes: Pupils are equal, round, and reactive to light. EOM are normal.  Neck: Normal range of motion.  Cardiovascular: Normal rate, regular rhythm and normal heart sounds.  Pulmonary/Chest: Effort normal and breath sounds normal.  Abdominal: Soft. Bowel sounds are normal.  Neurological: She is alert and oriented to person, place, and time.  Skin: Skin is warm and dry.  Changes of radiation dermatitis noted over the skin of the right  breast.  No palpable masses in either breast or no palpableAxillary adenopathy  CMP Latest Ref Rng & Units 07/17/2017  Glucose 65 - 99 mg/dL 107(H)  BUN 6 - 20 mg/dL 13  Creatinine 0.44 - 1.00 mg/dL 0.60  Sodium 135 - 145 mmol/L 138  Potassium 3.5 - 5.1 mmol/L 3.9  Chloride 101 - 111 mmol/L 109  CO2 22 - 32 mmol/L 23  Calcium 8.9 - 10.3 mg/dL 8.6(L)  Total Protein 6.5 - 8.1 g/dL 6.4(L)  Total Bilirubin 0.3 - 1.2 mg/dL 0.5  Alkaline Phos 38 - 126 U/L 111  AST 15 - 41 U/L 37  ALT 14 - 54 U/L 83(H)   CBC Latest Ref Rng & Units 07/17/2017  WBC 3.6 - 11.0 K/uL 3.6  Hemoglobin 12.0 - 16.0 g/dL 11.0(L)  Hematocrit 35.0 - 47.0 % 33.3(L)  Platelets 150 - 440 K/uL 186      Assessment and plan- Patient is a 57 y.o. 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  This was followed by lumpectomy with axillary lymph node dissection with residual disease noted at the primary site in the right breast of 16 mm.  Negative axillary lymph nodes.  Hilum was negative on PET.  She then went on to receive adjuvant radiation treatment  Is to get a repeat PET/CT to allow her to recover from radiation and will be done sometime in early October 2019.  Given that she had residual disease in her right breast plan is to proceed with adjuvant Xeloda at this time.  However labs done at [facility] shows that her white count is 2.4 along with an ANC of 1.3.  Her hemoglobin and platelet counts are within normal limits.    Due to her ongoing mild neutropenia we have decided to proceed with a lower dose of Xeloda 750 milligrams twice daily instead of 1500 mg twice daily 1 week on 1 week off instead  of 2 weeks on 1 week off.  I have discussed this plan with Dr. Alinda Money as well and he will be seeing her next week.  Discussed risks and benefits of Xeloda including all but not limited to  nausea, vomiting, fatigue, risk of hand-foot syndrome and diarrhea.  Patient understands and agrees to proceed as planned.  Although patient has stage IV disease treatment is being given with the potential curative intent.  I suspect her ongoing neutropenia is due to myelosuppression from chemotherapy as well as radiation therapy and should recover in due course.  I will see her back on 02/01/2018 during her week off Xeloda with repeat CBC and CMP.  Her baseline tumor markers have always been normal and not useful to monitor her disease.    Visit Diagnosis 1. Malignant neoplasm of upper-outer quadrant of right breast in female, estrogen receptor negative ([place])   2. High risk medication use      Dr. Randa Evens, MD, MPH [facility] [phone] 01/21/2018 1:41 PM

## 2018-01-21 NOTE — Telephone Encounter (Signed)
Oral Chemotherapy Pharmacist Encounter   Spoke with Ashley Molina following her office visit on 01/18/18. She is getting ready to start her Xeloda regimen. Provider her with the phone number needed to contact [facility] about filling the prescription.   Dr. Janese Banks wanted to change the dosing and frequency of her Xeloda from the originally prescription sent to [facility] due to current CBC. Resent the new prescription to [place] and informed my [facility] contact of the change. Instructed Ms. Fettes to call [facility] today if she had not heard anything from them about filling the medication.  Darl Pikes, PharmD, BCPS, [facility] Hematology/Oncology Clinical Pharmacist [facility] [phone]  01/21/2018 10:32 AM

## 2018-01-22 ENCOUNTER — Inpatient Hospital Stay: Payer: Managed Care, Other (non HMO)

## 2018-01-22 DIAGNOSIS — C50411 Malignant neoplasm of upper-outer quadrant of right female breast: Secondary | ICD-10-CM

## 2018-01-22 DIAGNOSIS — Z171 Estrogen receptor negative status [ER-]: Principal | ICD-10-CM

## 2018-01-22 LAB — COMPREHENSIVE METABOLIC PANEL
ALBUMIN: 3.9 g/dL (ref 3.5–5.0)
ALT: 41 U/L (ref 0–44)
AST: 33 U/L (ref 15–41)
Alkaline Phosphatase: 123 U/L (ref 38–126)
Anion gap: 7 (ref 5–15)
BUN: 14 mg/dL (ref 6–20)
CHLORIDE: 108 mmol/L (ref 98–111)
CO2: 25 mmol/L (ref 22–32)
Calcium: 9.2 mg/dL (ref 8.9–10.3)
Creatinine, Ser: 0.74 mg/dL (ref 0.44–1.00)
GFR calc Af Amer: >60 mL/min (ref >60)
GFR calc non Af Amer: >60 mL/min (ref >60)
GLUCOSE: 126 mg/dL — AB (ref 70–99)
Potassium: 3.7 mmol/L (ref 3.5–5.1)
SODIUM: 140 mmol/L (ref 135–145)
Total Bilirubin: 0.4 mg/dL (ref 0.3–1.2)
Total Protein: 7.4 g/dL (ref 6.5–8.1)

## 2018-01-23 ENCOUNTER — Encounter: Payer: Self-pay | Admitting: Oncology

## 2018-01-28 ENCOUNTER — Telehealth: Payer: Self-pay | Admitting: *Deleted

## 2018-01-28 NOTE — Telephone Encounter (Signed)
I called pt about moving her appt depending on when she started xeloda and she started 8/24 and from that we will see her on 9/6 and appt made and pt agreeable. She also states that her rad. dotor told her that she has inc. Risk of getting pneumonitis in 2-4 week frame after radiation. She has been experiencing low grade fevers a few days 99.8, 100.1, 100.4 and she starts feeling dec. Energy and evening time she gets achy and has some chills. She walks every day and usually on the phone but her friend on the line said that she talked like she was a little sob but pt did not think anything was wrong with her. She has a call into her rad. MD and will keep up informed of what they tell her. Dr. Janese Banks of above sx.

## 2018-02-01 ENCOUNTER — Inpatient Hospital Stay: Payer: Managed Care, Other (non HMO) | Admitting: Oncology

## 2018-02-08 ENCOUNTER — Encounter: Payer: Self-pay | Admitting: Oncology

## 2018-02-08 ENCOUNTER — Inpatient Hospital Stay: Payer: Managed Care, Other (non HMO) | Attending: Oncology | Admitting: Oncology

## 2018-02-08 VITALS — BP 111/75 | HR 91 | Temp 100.2°F | Resp 18 | Wt 192.0 lb

## 2018-02-08 DIAGNOSIS — C773 Secondary and unspecified malignant neoplasm of axilla and upper limb lymph nodes: Secondary | ICD-10-CM | POA: Diagnosis not present

## 2018-02-08 DIAGNOSIS — Z171 Estrogen receptor negative status [ER-]: Secondary | ICD-10-CM | POA: Insufficient documentation

## 2018-02-08 DIAGNOSIS — C50411 Malignant neoplasm of upper-outer quadrant of right female breast: Secondary | ICD-10-CM | POA: Insufficient documentation

## 2018-02-08 DIAGNOSIS — D701 Agranulocytosis secondary to cancer chemotherapy: Secondary | ICD-10-CM | POA: Diagnosis not present

## 2018-02-08 DIAGNOSIS — R918 Other nonspecific abnormal finding of lung field: Secondary | ICD-10-CM | POA: Insufficient documentation

## 2018-02-08 DIAGNOSIS — Z9221 Personal history of antineoplastic chemotherapy: Secondary | ICD-10-CM | POA: Insufficient documentation

## 2018-02-08 DIAGNOSIS — Z79899 Other long term (current) drug therapy: Secondary | ICD-10-CM

## 2018-02-08 DIAGNOSIS — Z923 Personal history of irradiation: Secondary | ICD-10-CM | POA: Insufficient documentation

## 2018-02-08 MED ORDER — UREA 10 % EX CREA: TOPICAL_CREAM | Freq: Two times a day (BID) | CUTANEOUS | 0 refills | Status: DC

## 2018-02-08 NOTE — Progress Notes (Signed)
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 Patient is a 57 -year-old postmenopausal female who had a routine screening mammogram on 03/23/2017 which showed a highly suspicious palpable solid mass in the 10 o'clock position. Mass measures at least 5 cm in maximum diameter for right axillary lymph nodes have sonographic features suspicious for metastatic disease. No evidence of malignancy was noted in the left breast. This was followed by an ultrasound of the right breast including the right axilla which confirmed the same findings.Targeted ultrasound is performed, showing a markedly irregular and hypoechoic mass centered at 10 o'clock position approximately 7 cm from the nipple estimated to be at least 5.2 x 4.4 x 3.4 cm. Small areas of vascular flow are detected within the mass. At 10 o'clock position 9 cm from the nipple again noted is a simple cyst measuring approximately 2.4 cm greatest diameter. Skin thickness of the medial and lateral of right breast measures approximately 4 mm.  Ultrasound of the right axilla demonstrates 4 suspicious lymph nodes with hypoechoic thickened cortices. The largest of these lymph nodes measures 2.2 x 1.2 x 1.0 cm and has a cortical thickness of 4-5 mm.  Patient underwent core biopsy of the right breast mass.  2. 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 Right axillary  lymph node biopsy was also consistent with metastatic carcinoma. ERnegativePR5% positive,and HER-2negative by FISH  3. Patient is G3P3L3. Menarche at the age of 54. Menopause at 76. No priro h/o breast biopsies. No family h/o breast cancer. She was known to have right breast cyst being followed by Dr. Bary Castilla. She noticed this amss sometime over the summer and feels this has grown since then. She is healthy and has no significant co-morbidities  4. PET/CT scan on 04/04/17 showed:IMPRESSION: 1. Hypermetabolic right breast mass with hypermetabolic lymph nodes in the right axilla and hypermetabolic metastatic disease in both hilar regions and subcarinal mediastinum. 2. No evidence for hypermetabolic metastatic disease in the neck, abdomen or pelvis  5. Patient underwent bronchoscopy and biopsy of hilar LN which was positive for metastatic carcinoma from the breast. She also underwent RUl biopsy and bronchial washings that were negative for malignancy.Baseline CA-15-3 normal at 16.9 And CA-27-29 normal at 15.8. Patient seen by Dr. Alinda Money at [facility] and plan was to proceed with carbo/taxol followed by pet and if she has a good response proceed to dd [facility] X4 and possible surgery and RT down the line  6.Patient has had significant neutropenia despite dose reduction of carboplatin and adding Neupogen requiring dose interruptions. Plan wastherefore to switch her to every 2 weekly cycle of carbotaxol for the remainder of cycles with on pro-Neulasta support which she completed on 07/17/17  7. PET scan after carbo/taxol showed:Marland Kitchen Interval decrease in size of the right breast mass and significant reduction in hypermetabolism. 2. Resolution of right axillary adenopathy and mediastinal and hilar adenopathy. 3. No findings for metastatic disease involving the abdomen/pelvis. 4. Diffuse osseous hypermetabolism likely due to rebound from chemotherapy  or marrow stimulating drugs.  8. Right breast US  showed:Ultrasound examination of the upper outer quadrant of the right breast to reassess tumor response was completed. A dominant cyst remains in the 10 o'clock position, 10 cm from the nipple. Immediately adjacent to this at the 1030 o'clock position is the primary tumor mass now measuring 1.7 x 2.7 x 4.0 cm. This area previously measured 3.7 x 3.7 x 7.2 cm. This represents an approximately 80% decrease in volume. A single mildly prominent right axillary node measuring 0.6 x 0.7 cm with internal vascular flow is noted. BI-RADS-6.Seen by Dr. Alinda Money on 08/13/17 who recommends continuing 4 cycles of dd AC followed by PET and then possible surgery/ RT  9. Patient seen by [facility] team at [facility]. Plan is to proceed with definitive surgery- right lumpectomy and ALND followed by adjuvant RT including RT to the hilar region.  10. Patient underwentRight wire localized partial mastectomy, tissue rearrangement, axillary lymph node dissection and axillary reverse mapping, performed on 10/23/2017. Final pathology showed 16 mm residual tumor. 0/12 Ln positive for malignancy. Grade 2. Negative margins. No LVI. ER, PR and her 2 neu negative.  She also completed radiation therapy to her chest wall and hilar region at in August 2019  Interval history- reports feeling mild fatigue. Felt sob when she climbed a flight of stairs which is unuusal for her. Has had low grade temp 99-100. She is scheduled to get ct chest next week to assess for radiation pneumonitis. Tolerated her week of xeloda well without any rash or diarrhea. No nausea  ECOG PS- 0 Pain scale- 0   Review of systems- Review of Systems  Constitutional: Positive for malaise/fatigue. Negative for chills, fever and weight loss.  HENT: Negative for congestion, ear discharge and nosebleeds.   Eyes: Negative for blurred vision.  Respiratory: Positive for shortness of breath. Negative for cough, hemoptysis, sputum production and wheezing.   Cardiovascular:  Negative for chest pain, palpitations, orthopnea and claudication.  Gastrointestinal: Negative for abdominal pain, blood in stool, constipation, diarrhea, heartburn, melena, nausea and vomiting.  Genitourinary: Negative for dysuria, flank pain, frequency, hematuria and urgency.  Musculoskeletal: Negative for back pain, joint pain and myalgias.  Skin: Negative for rash.  Neurological: Negative for dizziness, tingling, focal weakness, seizures, weakness and headaches.  Endo/Heme/Allergies: Does not bruise/bleed easily.  Psychiatric/Behavioral: Negative for depression and suicidal ideas. The patient does not have insomnia.       Allergies  Allergen Reactions  . Penicillins Rash     Past Medical History:  Diagnosis Date  . Anemia   . Breast cancer ([place])   . Breast cyst, right    aspirated by Dr. Bary Castilla  . Hyperlipidemia   . Personal history of chemotherapy   . Pre-diabetes      Past Surgical History:  Procedure Laterality Date  . AXILLARY LYMPH NODE BIOPSY Right 04/09/2017   Procedure: AXILLARY LYMPH NODE BIOPSY;  Surgeon: Robert Bellow, MD;  Location: [facility] ORS;  Service: General;  Laterality: Right;  . BREAST BIOPSY Right 03/27/2017   US guided breast mass - invasive mammary carcinoma  . BREAST BIOPSY Right 03/27/2017   Lymph node - metastatic carcinoma  . BREAST BIOPSY Right 04/09/2017   Lymph node   . BREAST CYST ASPIRATION Right 11/2009   Dr. Bary Castilla did FNA  . COLONOSCOPY  2011  . DILATION AND CURETTAGE OF UTERUS     X3  . ENDOBRONCHIAL ULTRASOUND N/A 04/09/2017   Procedure: ENDOBRONCHIAL ULTRASOUND;  Surgeon: Laverle Hobby,  MD;  Location: [facility] ORS;  Service: Pulmonary;  Laterality: N/A;  . ENDOMETRIAL ABLATION    . ENDOMETRIAL BIOPSY  09/2009  . PORTACATH PLACEMENT Left 04/09/2017   Procedure: INSERTION PORT-A-CATH;  Surgeon: Robert Bellow, MD;  Location: [facility] ORS;  Service: General;  Laterality: Left;    Social History   Socioeconomic History    . Marital status: Married    Spouse name: Not on file  . Number of children: Not on file  . Years of education: Not on file  . Highest education level: Not on file  Occupational History  . Not on file  Social Needs  . Financial resource strain: Not on file  . Food insecurity:    Worry: Not on file    Inability: Not on file  . Transportation needs:    Medical: Not on file    Non-medical: Not on file  Tobacco Use  . Smoking status: Never Smoker  . Smokeless tobacco: Never Used  Substance and Sexual Activity  . Alcohol use: Yes    Alcohol/week: 2.0 standard drinks    Types: 2 Glasses of wine per week  . Drug use: No  . Sexual activity: Not Currently  Lifestyle  . Physical activity:    Days per week: Not on file    Minutes per session: Not on file  . Stress: Not on file  Relationships  . Social connections:    Talks on phone: Not on file    Gets together: Not on file    Attends religious service: Not on file    Active member of club or organization: Not on file    Attends meetings of clubs or organizations: Not on file    Relationship status: Not on file  . Intimate partner violence:    Fear of current or ex partner: Not on file    Emotionally abused: Not on file    Physically abused: Not on file    Forced sexual activity: Not on file  Other Topics Concern  . Not on file  Social History Narrative  . Not on file    Family History  Problem Relation Age of Onset  . Melanoma Maternal Grandmother 47       currently 71  . Brain cancer Maternal Grandfather 54       unk. type; deceased in 60s  . Melanoma Other 51       mat grandmother's father  . Melanoma Father 50       on head; currently 69  . Prostate cancer Maternal Uncle        3 maternal uncles; dx in 64s  . Breast cancer Other        mat grandfather's sister; dx 63s     Current Outpatient Medications:  .  capecitabine (XELODA) 500 MG tablet, Take 3 tablets (1,500 mg total) by mouth 2 (two) times daily  after a meal. Take for 1 week on, then 1 week off, Disp: 84 tablet, Rfl: 2 .  Multiple Vitamins-Minerals (CENTRUM SILVER ADULT 50+) TABS, Take 1 tablet by mouth daily., Disp: , Rfl:  .  Multiple Vitamins-Minerals (EMERGEN-C IMMUNE PLUS PO), Take 1 packet by mouth 1 day or 1 dose. With energy, Disp: , Rfl:  .  Biotin 2500 MCG CAPS, Take 5,000 mcg/day by mouth daily., Disp: , Rfl:  .  Cholecalciferol (VITAMIN D3) 10000 units TABS, Take by mouth 1 day or 1 dose., Disp: , Rfl:  .  Homeopathic Products (UMCKA COLDCARE PO), Take  1 pg/oz/day by mouth 3 (three) times daily., Disp: , Rfl:  .  loratadine (CLARITIN) 10 MG tablet, Take 10 mg by mouth daily., Disp: , Rfl:  .  LYCOPENE PO, Take by mouth., Disp: , Rfl:  .  omega-3 acid ethyl esters (LOVAZA) 1 g capsule, Take by mouth 1 day or 1 dose., Disp: , Rfl:  No current facility-administered medications for this visit.   Facility-Administered Medications Ordered in Other Visits:  .  Tbo-Filgrastim (GRANIX) injection 480 mcg, 480 mcg, Subcutaneous, Daily, Sindy Guadeloupe, MD, 480 mcg at 05/19/17 1412  Physical exam:  Vitals:   02/08/18 0929 02/08/18 0935  BP:  111/75  Pulse:  91  Resp:  18  Temp:  100.2 F (37.9 C)  TempSrc:  Tympanic  SpO2:  95%  Weight: 192 lb (87.1 kg)    Physical Exam  Constitutional: She is oriented to person, place, and time. She appears well-developed and well-nourished.  HENT:  Head: Normocephalic and atraumatic.  Eyes: Pupils are equal, round, and reactive to light. EOM are normal.  Neck: Normal range of motion.  Cardiovascular: Normal rate, regular rhythm and normal heart sounds.  Pulmonary/Chest: Effort normal and breath sounds normal.  Abdominal: Soft. Bowel sounds are normal.  Neurological: She is alert and oriented to person, place, and time.  Skin: Skin is warm and dry.     CMP Latest Ref Rng & Units 01/22/2018  Glucose 70 - 99 mg/dL 126(H)  BUN 6 - 20 mg/dL 14  Creatinine 0.44 - 1.00 mg/dL 0.74  Sodium  135 - 145 mmol/L 140  Potassium 3.5 - 5.1 mmol/L 3.7  Chloride 98 - 111 mmol/L 108  CO2 22 - 32 mmol/L 25  Calcium 8.9 - 10.3 mg/dL 9.2  Total Protein 6.5 - 8.1 g/dL 7.4  Total Bilirubin 0.3 - 1.2 mg/dL 0.4  Alkaline Phos 38 - 126 U/L 123  AST 15 - 41 U/L 33  ALT 0 - 44 U/L 41   CBC Latest Ref Rng & Units 07/17/2017  WBC 3.6 - 11.0 K/uL 3.6  Hemoglobin 12.0 - 16.0 g/dL 11.0(L)  Hematocrit 35.0 - 47.0 % 33.3(L)  Platelets 150 - 440 K/uL 186     Assessment and plan- Patient is a 57 y.o. 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  This was followed by lumpectomy with axillary lymph node dissection with residual disease noted at the primary site in the right breast of 16 mm.  Negative axillary lymph nodes.  Hilum was negative on PET.  She then went on to receive adjuvant radiation treatment.  Adjuvant Xeloda was started on 01/28/2018  With regards to Xeloda patient is currently on a 1 week on 1 week off regimen will be starting back on Xeloda tomorrow.  She was given lower dose of Xeloda 750 mg/m instead of thousand milligrams per meter squared as she continued to have some mild neutropenia.  Her white count is up to 3.2 today and ANC is 2 improved from 1.4 before.  She will therefore proceed with thousand milligrams per meter square of Xeloda but will continue to take it 1 week on and one-week off.  We will also prescribe her prophylactic urea cream at this time  Will check CBC and CMP on 02/21/2018 and I will see her on 02/22/2018 prior to starting cycle 2.  If her counts are further improved I  will switch her to the 2-week on 1 week off regimen.  He has signs and symptoms of possible radiation pneumonitis.  Chest x-ray was unremarkable and she has a CT chest scheduled next week following which she will follow-up with radiation oncology at  [facility].  She will be getting repeat PET/CT scan on 03/05/2018 and I will see her following that to discuss the results and further management   Visit Diagnosis 1. Malignant neoplasm of upper-outer quadrant of right breast in female, estrogen receptor negative ([place])   2. High risk medication use      Dr. Randa Evens, MD, MPH [facility] [phone] 02/08/2018 2:06 PM

## 2018-02-13 ENCOUNTER — Other Ambulatory Visit: Payer: Self-pay | Admitting: Pharmacist

## 2018-02-13 DIAGNOSIS — C50411 Malignant neoplasm of upper-outer quadrant of right female breast: Secondary | ICD-10-CM

## 2018-02-13 DIAGNOSIS — Z171 Estrogen receptor negative status [ER-]: Secondary | ICD-10-CM

## 2018-02-13 MED ORDER — CAPECITABINE 500 MG PO TABS: 1000.0000 mg/m2 | ORAL_TABLET | Freq: Two times a day (BID) | ORAL | 0 refills | Status: DC

## 2018-02-14 ENCOUNTER — Encounter: Payer: Self-pay | Admitting: Oncology

## 2018-02-14 ENCOUNTER — Telehealth: Payer: Self-pay | Admitting: *Deleted

## 2018-02-14 NOTE — Telephone Encounter (Signed)
Patient called to report that she will run out of XELODA before she completes the week because Dr. Janese Banks changed her dose but the prescription remained the same. Prescription needs to be updated with specialty pharmacy. Patient also needs advice on next steps if she runs out of medication. She also wanted to know if you had the results from her [facility] scan.

## 2018-02-19 ENCOUNTER — Encounter: Payer: Self-pay | Admitting: Oncology

## 2018-02-20 ENCOUNTER — Other Ambulatory Visit: Payer: Self-pay | Admitting: *Deleted

## 2018-02-20 DIAGNOSIS — C50911 Malignant neoplasm of unspecified site of right female breast: Secondary | ICD-10-CM

## 2018-02-20 DIAGNOSIS — C50411 Malignant neoplasm of upper-outer quadrant of right female breast: Secondary | ICD-10-CM

## 2018-02-20 DIAGNOSIS — C7800 Secondary malignant neoplasm of unspecified lung: Secondary | ICD-10-CM

## 2018-02-20 DIAGNOSIS — Z171 Estrogen receptor negative status [ER-]: Principal | ICD-10-CM

## 2018-02-22 ENCOUNTER — Inpatient Hospital Stay: Payer: Managed Care, Other (non HMO)

## 2018-02-22 ENCOUNTER — Encounter: Payer: Self-pay | Admitting: Oncology

## 2018-02-22 ENCOUNTER — Inpatient Hospital Stay (HOSPITAL_BASED_OUTPATIENT_CLINIC_OR_DEPARTMENT_OTHER): Payer: Managed Care, Other (non HMO) | Admitting: Oncology

## 2018-02-22 VITALS — BP 125/78 | HR 78 | Temp 98.2°F | Resp 18 | Ht 63.0 in | Wt 195.8 lb

## 2018-02-22 DIAGNOSIS — C50911 Malignant neoplasm of unspecified site of right female breast: Secondary | ICD-10-CM

## 2018-02-22 DIAGNOSIS — Z79899 Other long term (current) drug therapy: Secondary | ICD-10-CM

## 2018-02-22 DIAGNOSIS — D701 Agranulocytosis secondary to cancer chemotherapy: Secondary | ICD-10-CM

## 2018-02-22 DIAGNOSIS — R918 Other nonspecific abnormal finding of lung field: Secondary | ICD-10-CM

## 2018-02-22 DIAGNOSIS — C773 Secondary and unspecified malignant neoplasm of axilla and upper limb lymph nodes: Secondary | ICD-10-CM

## 2018-02-22 DIAGNOSIS — C50411 Malignant neoplasm of upper-outer quadrant of right female breast: Secondary | ICD-10-CM | POA: Diagnosis not present

## 2018-02-22 DIAGNOSIS — C7800 Secondary malignant neoplasm of unspecified lung: Principal | ICD-10-CM

## 2018-02-22 DIAGNOSIS — Z9221 Personal history of antineoplastic chemotherapy: Secondary | ICD-10-CM

## 2018-02-22 DIAGNOSIS — Z171 Estrogen receptor negative status [ER-]: Secondary | ICD-10-CM | POA: Diagnosis not present

## 2018-02-22 DIAGNOSIS — Z923 Personal history of irradiation: Secondary | ICD-10-CM

## 2018-02-22 LAB — COMPREHENSIVE METABOLIC PANEL
ALK PHOS: 112 U/L (ref 38–126)
ALT: 21 U/L (ref 0–44)
ANION GAP: 7 (ref 5–15)
AST: 18 U/L (ref 15–41)
Albumin: 3.7 g/dL (ref 3.5–5.0)
BILIRUBIN TOTAL: 0.6 mg/dL (ref 0.3–1.2)
BUN: 23 mg/dL — ABNORMAL HIGH (ref 6–20)
CALCIUM: 9.1 mg/dL (ref 8.9–10.3)
CO2: 26 mmol/L (ref 22–32)
Chloride: 106 mmol/L (ref 98–111)
Creatinine, Ser: 0.84 mg/dL (ref 0.44–1.00)
GFR calc Af Amer: >60 mL/min (ref >60)
GFR calc non Af Amer: >60 mL/min (ref >60)
Glucose, Bld: 86 mg/dL (ref 70–99)
Potassium: 3.6 mmol/L (ref 3.5–5.1)
SODIUM: 139 mmol/L (ref 135–145)
TOTAL PROTEIN: 6.6 g/dL (ref 6.5–8.1)

## 2018-02-22 LAB — CBC WITH DIFFERENTIAL/PLATELET
Basophils Absolute: 0 10*3/uL (ref 0–0.1)
Basophils Relative: 0 %
EOS ABS: 0.2 10*3/uL (ref 0–0.7)
EOS PCT: 5 %
HCT: 33.5 % — ABNORMAL LOW (ref 35.0–47.0)
Hemoglobin: 11.1 g/dL — ABNORMAL LOW (ref 12.0–16.0)
Lymphocytes Relative: 9 %
Lymphs Abs: 0.4 10*3/uL — ABNORMAL LOW (ref 1.0–3.6)
MCH: 27.6 pg (ref 26.0–34.0)
MCHC: 33.1 g/dL (ref 32.0–36.0)
MCV: 83.6 fL (ref 80.0–100.0)
MONO ABS: 0.5 10*3/uL (ref 0.2–0.9)
Monocytes Relative: 11 %
NEUTROS ABS: 3.5 10*3/uL (ref 1.4–6.5)
NEUTROS PCT: 75 %
PLATELETS: 187 10*3/uL (ref 150–440)
RBC: 4.01 MIL/uL (ref 3.80–5.20)
RDW: 15.6 % — AB (ref 11.5–14.5)
WBC: 4.7 10*3/uL (ref 3.6–11.0)

## 2018-02-22 NOTE — Progress Notes (Signed)
No new changes noted today 

## 2018-02-25 NOTE — Progress Notes (Signed)
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 Patient is a 57 -year-old postmenopausal female who had a routine screening mammogram on 03/23/2017 which showed a highly suspicious palpable solid mass in the 10 o'clock position. Mass measures at least 5 cm in maximum diameter for right axillary lymph nodes have sonographic features suspicious for metastatic disease. No evidence of malignancy was noted in the left breast. This was followed by an ultrasound of the right breast including the right axilla which confirmed the same findings.Targeted ultrasound is performed, showing a markedly irregular and hypoechoic mass centered at 10 o'clock position approximately 7 cm from the nipple estimated to be at least 5.2 x 4.4 x 3.4 cm. Small areas of vascular flow are detected within the mass. At 10 o'clock position 9 cm from the nipple again noted is a simple cyst measuring approximately 2.4 cm greatest diameter. Skin thickness of the medial and lateral of right breast measures approximately 4 mm.  Ultrasound of the right axilla demonstrates 4 suspicious lymph nodes with hypoechoic thickened cortices. The largest of these lymph nodes measures 2.2 x 1.2 x 1.0 cm and has a cortical thickness of 4-5 mm.  Patient underwent core biopsy of the right breast mass.  2.  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 Right axillary lymph node biopsy was also consistent with metastatic carcinoma. ERnegativePR5% positive,and HER-2negative by FISH  3. Patient is G3P3L3. Menarche at the age of 21. Menopause at 37. No priro h/o breast biopsies. No family h/o breast cancer. She was known to have right breast cyst being followed by Dr. Bary Castilla. She noticed this amss sometime over the summer and feels this has grown since then. She is healthy and has no significant co-morbidities  4. PET/CT scan on 04/04/17 showed:IMPRESSION: 1. Hypermetabolic right breast mass with hypermetabolic lymph nodes in the right axilla and hypermetabolic metastatic disease in both hilar regions and subcarinal mediastinum. 2. No evidence for hypermetabolic metastatic disease in the neck, abdomen or pelvis  5. Patient underwent bronchoscopy and biopsy of hilar LN which was positive for metastatic carcinoma from the breast. She also underwent RUl biopsy and bronchial washings that were negative for malignancy.Baseline CA-15-3 normal at 16.9 And CA-27-29 normal at 15.8. Patient seen by Dr. Alinda Money at [facility] and plan was to proceed with carbo/taxol followed by pet and if she has a good response proceed to dd [facility] X4 and possible surgery and RT down the line  6.Patient has had significant neutropenia despite dose reduction of carboplatin and adding Neupogen requiring dose interruptions. Plan wastherefore to switch her to every 2 weekly cycle of carbotaxol for the remainder of cycles with on pro-Neulasta support which she completed on 07/17/17  7. PET scan after carbo/taxol showed:Marland Kitchen Interval decrease in size of the right breast mass and significant reduction in hypermetabolism. 2. Resolution of right axillary adenopathy and mediastinal and hilar adenopathy. 3. No findings for metastatic disease involving the  abdomen/pelvis. 4. Diffuse osseous hypermetabolism likely due to rebound  from chemotherapy or marrow stimulating drugs.  8. Right breast US showed:Ultrasound examination of the upper outer quadrant of the right breast to reassess tumor response was completed. A dominant cyst remains in the 10 o'clock position, 10 cm from the nipple. Immediately adjacent to this at the 1030 o'clock position is the primary tumor mass now measuring 1.7 x 2.7 x 4.0 cm. This area previously measured 3.7 x 3.7 x 7.2 cm. This represents an approximately 80% decrease in volume. A single mildly prominent right axillary node measuring 0.6 x 0.7 cm with internal vascular flow is noted. BI-RADS-6.Seen by Dr. Alinda Money on 08/13/17 who recommends continuing 4 cycles of dd AC followed by PET and then possible surgery/ RT  9. Patient seen by [facility] team at [facility]. Plan is to proceed with definitive surgery- right lumpectomy and ALND followed by adjuvant RT including RT to the hilar region.  10. Patient underwentRight wire localized partial mastectomy, tissue rearrangement, axillary lymph node dissection and axillary reverse mapping, performed on 10/23/2017. Final pathology showed 16 mm residual tumor. 0/12 Ln positive for malignancy. Grade 2. Negative margins. No LVI. ER, PR and her 2 neu negative. She also completed radiation therapy to her chest wall and hilar region at in August 2019   Interval history-patient underwent CT chest without contrast at [facility] on 02/13/2018.  Suspect symptoms of radiation pneumonitis given that she was having dry cough and low-grade fevers.  There were concerns for radiation pneumonitis and patient has been started on steroids by radiation oncology with a slow taper.  Reports that since she started steroids her fevers have gone and her cough is almost resolved.  She is currently in her 1 week off from Xeloda and has tolerated cycle 1 well without any significant rash nausea vomiting or skin changes.  ECOG PS- 0 Pain scale- 0 Opioid associated constipation- no  Review of  systems- Review of Systems  Constitutional: Negative for chills, fever, malaise/fatigue and weight loss.  HENT: Negative for congestion, ear discharge and nosebleeds.   Eyes: Negative for blurred vision.  Respiratory: Negative for cough, hemoptysis, sputum production, shortness of breath and wheezing.   Cardiovascular: Negative for chest pain, palpitations, orthopnea and claudication.  Gastrointestinal: Negative for abdominal pain, blood in stool, constipation, diarrhea, heartburn, melena, nausea and vomiting.  Genitourinary: Negative for dysuria, flank pain, frequency, hematuria and urgency.  Musculoskeletal: Negative for back pain, joint pain and myalgias.  Skin: Negative for rash.  Neurological: Negative for dizziness, tingling, focal weakness, seizures, weakness and headaches.  Endo/Heme/Allergies: Does not bruise/bleed easily.  Psychiatric/Behavioral: Negative for depression and suicidal ideas. The patient does not have insomnia.       Allergies  Allergen Reactions  . Penicillins Rash     Past Medical History:  Diagnosis Date  . Anemia   . Breast cancer ([place])   . Breast cyst, right    aspirated by Dr. Bary Castilla  . Hyperlipidemia   . Personal history of chemotherapy   . Pre-diabetes      Past Surgical History:  Procedure Laterality Date  . AXILLARY LYMPH NODE BIOPSY Right 04/09/2017   Procedure: AXILLARY LYMPH NODE BIOPSY;  Surgeon: Robert Bellow, MD;  Location: [facility] ORS;  Service: General;  Laterality: Right;  . BREAST BIOPSY Right 03/27/2017   US guided breast mass - invasive mammary carcinoma  . BREAST BIOPSY Right 03/27/2017   Lymph node - metastatic carcinoma  . BREAST BIOPSY Right 04/09/2017  Lymph node   . BREAST CYST ASPIRATION Right 11/2009   Dr. Bary Castilla did FNA  . COLONOSCOPY  2011  . DILATION AND CURETTAGE OF UTERUS     X3  . ENDOBRONCHIAL ULTRASOUND N/A 04/09/2017   Procedure: ENDOBRONCHIAL ULTRASOUND;  Surgeon: Laverle Hobby, MD;  Location:  [facility] ORS;  Service: Pulmonary;  Laterality: N/A;  . ENDOMETRIAL ABLATION    . ENDOMETRIAL BIOPSY  09/2009  . PORTACATH PLACEMENT Left 04/09/2017   Procedure: INSERTION PORT-A-CATH;  Surgeon: Robert Bellow, MD;  Location: [facility] ORS;  Service: General;  Laterality: Left;    Social History   Socioeconomic History  . Marital status: Married    Spouse name: Not on file  . Number of children: Not on file  . Years of education: Not on file  . Highest education level: Not on file  Occupational History  . Not on file  Social Needs  . Financial resource strain: Not on file  . Food insecurity:    Worry: Not on file    Inability: Not on file  . Transportation needs:    Medical: Not on file    Non-medical: Not on file  Tobacco Use  . Smoking status: Never Smoker  . Smokeless tobacco: Never Used  Substance and Sexual Activity  . Alcohol use: Yes    Alcohol/week: 2.0 standard drinks    Types: 2 Glasses of wine per week  . Drug use: No  . Sexual activity: Not Currently  Lifestyle  . Physical activity:    Days per week: Not on file    Minutes per session: Not on file  . Stress: Not on file  Relationships  . Social connections:    Talks on phone: Not on file    Gets together: Not on file    Attends religious service: Not on file    Active member of club or organization: Not on file    Attends meetings of clubs or organizations: Not on file    Relationship status: Not on file  . Intimate partner violence:    Fear of current or ex partner: Not on file    Emotionally abused: Not on file    Physically abused: Not on file    Forced sexual activity: Not on file  Other Topics Concern  . Not on file  Social History Narrative  . Not on file    Family History  Problem Relation Age of Onset  . Melanoma Maternal Grandmother 13       currently 61  . Brain cancer Maternal Grandfather 7       unk. type; deceased in 23s  . Melanoma Other 51       mat grandmother's father  .  Melanoma Father 30       on head; currently 38  . Prostate cancer Maternal Uncle        3 maternal uncles; dx in 64s  . Breast cancer Other        mat grandfather's sister; dx 46s     Current Outpatient Medications:  .  Biotin 2500 MCG CAPS, Take 5,000 mcg/day by mouth daily., Disp: , Rfl:  .  capecitabine (XELODA) 500 MG tablet, Take 4 tablets (2,000 mg total) by mouth 2 (two) times daily after a meal. Take for 1 week on, then 1 week off, Disp: 120 tablet, Rfl: 0 .  Cholecalciferol (VITAMIN D3) 10000 units TABS, Take by mouth 1 day or 1 dose., Disp: , Rfl:  .  Homeopathic Products (  UMCKA COLDCARE PO), Take 1 pg/oz/day by mouth 3 (three) times daily., Disp: , Rfl:  .  LYCOPENE PO, Take by mouth., Disp: , Rfl:  .  Multiple Vitamins-Minerals (CENTRUM SILVER ADULT 50+) TABS, Take 1 tablet by mouth daily., Disp: , Rfl:  .  Multiple Vitamins-Minerals (EMERGEN-C IMMUNE PLUS PO), Take 1 packet by mouth 1 day or 1 dose. With energy, Disp: , Rfl:  .  omega-3 acid ethyl esters (LOVAZA) 1 g capsule, Take by mouth 1 day or 1 dose., Disp: , Rfl:  .  pantoprazole (PROTONIX) 40 MG tablet, TK 1 T PO QD, Disp: , Rfl: 1 .  predniSONE (DELTASONE) 20 MG tablet, TK 2 TS PO D. FOR 21 DAYS TK 1 T QAM AND 1 QPM FOR WEEKS 1-3, Disp: , Rfl: 0 .  loratadine (CLARITIN) 10 MG tablet, Take 10 mg by mouth daily., Disp: , Rfl:  .  urea (CARMOL) 10 % cream, Apply topically 2 (two) times daily. (Patient not taking: Reported on 02/22/2018), Disp: 71 g, Rfl: 0 No current facility-administered medications for this visit.   Facility-Administered Medications Ordered in Other Visits:  .  Tbo-Filgrastim (GRANIX) injection 480 mcg, 480 mcg, Subcutaneous, Daily, Sindy Guadeloupe, MD, 480 mcg at 05/19/17 1412  Physical exam:  Vitals:   02/22/18 0951  BP: 125/78  Pulse: 78  Resp: 18  Temp: 98.2 F (36.8 C)  TempSrc: Oral  SpO2: 98%  Weight: 195 lb 12.8 oz (88.8 kg)  Height: _0  (1.6 m)   Physical Exam  Constitutional:  She is oriented to person, place, and time. She appears well-developed and well-nourished.  HENT:  Head: Normocephalic and atraumatic.  Eyes: Pupils are equal, round, and reactive to light. EOM are normal.  Neck: Normal range of motion.  Cardiovascular: Normal rate, regular rhythm and normal heart sounds.  Pulmonary/Chest: Effort normal and breath sounds normal.  Abdominal: Soft. Bowel sounds are normal.  Neurological: She is alert and oriented to person, place, and time.  Skin: Skin is warm and dry.  Patient is status post right lumpectomy with well-healed surgical scar.  No palpable masses in either breast.  No palpable bilateral axillary adenopathy.  CMP Latest Ref Rng & Units 02/22/2018  Glucose 70 - 99 mg/dL 86  BUN 6 - 20 mg/dL 23(H)  Creatinine 0.44 - 1.00 mg/dL 0.84  Sodium 135 - 145 mmol/L 139  Potassium 3.5 - 5.1 mmol/L 3.6  Chloride 98 - 111 mmol/L 106  CO2 22 - 32 mmol/L 26  Calcium 8.9 - 10.3 mg/dL 9.1  Total Protein 6.5 - 8.1 g/dL 6.6  Total Bilirubin 0.3 - 1.2 mg/dL 0.6  Alkaline Phos 38 - 126 U/L 112  AST 15 - 41 U/L 18  ALT 0 - 44 U/L 21   CBC Latest Ref Rng & Units 02/22/2018  WBC 3.6 - 11.0 K/uL 4.7  Hemoglobin 12.0 - 16.0 g/dL 11.1(L)  Hematocrit 35.0 - 47.0 % 33.5(L)  Platelets 150 - 440 K/uL 187     Assessment and plan- Patient is a 57 y.o. 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 This was followed by lumpectomy with axillary lymph node dissection with residual disease noted at the primary site in the right breast of 16 mm. Negative axillary lymph nodes. Hilum was negative on PET. She then went on to receive adjuvant radiation treatment.  Adjuvant Xeloda was started on 01/28/2018.  She is here for on treatment assessment prior to cycle 2 of adjuvant Xeloda  Patient is white count is normal at 4.7 and her [place]  is normal at 3.5.  It is likely that steroids are playing a role in this as well.  Patient can now proceed with Xeloda at thousand milligrams per meter squared 2 weeks on and one-week off and 01-week on 1 week of given that her [place] is normal.  She does have prophylactic urea cream to apply on her hands and feet.  She will be starting her second cycle in a couple of days and I will see her back on 03/15/2018 with CBC and CMP to be done at [facility] 2 days prior.  I did review the CT scan reports of the CT chest done at [facility] which did show some bibasilar as well as paramediastinal opacities and pulmonary nodules which were likely consistent with radiation pneumonitis.  Patient is currently on a steroid taper as prescribed by radiation oncology from [facility].  They would like me to hold off on her PET scan given her recent radiation and I will therefore be pushing her head CT scan to [facility] in November 2019.  Her baseline tumor markers have never been elevated and are not useful in monitoring her disease.   Visit Diagnosis 1. Malignant neoplasm of upper-outer quadrant of right breast in female, estrogen receptor negative ([place])   2. High risk medication use      Dr. Randa Evens, MD, MPH [facility] [phone] 02/25/2018 8:09 AM

## 2018-03-04 ENCOUNTER — Other Ambulatory Visit: Payer: Self-pay | Admitting: Oncology

## 2018-03-04 ENCOUNTER — Other Ambulatory Visit: Payer: Self-pay | Admitting: *Deleted

## 2018-03-04 DIAGNOSIS — Z171 Estrogen receptor negative status [ER-]: Principal | ICD-10-CM

## 2018-03-04 DIAGNOSIS — C50411 Malignant neoplasm of upper-outer quadrant of right female breast: Secondary | ICD-10-CM

## 2018-03-04 MED ORDER — CAPECITABINE 500 MG PO TABS: 2000.0000 mg | ORAL_TABLET | Freq: Two times a day (BID) | ORAL | 0 refills | Status: DC

## 2018-03-05 ENCOUNTER — Inpatient Hospital Stay: Payer: Managed Care, Other (non HMO) | Attending: Oncology

## 2018-03-05 DIAGNOSIS — Z171 Estrogen receptor negative status [ER-]: Secondary | ICD-10-CM | POA: Insufficient documentation

## 2018-03-05 DIAGNOSIS — Z923 Personal history of irradiation: Secondary | ICD-10-CM | POA: Insufficient documentation

## 2018-03-05 DIAGNOSIS — Z95828 Presence of other vascular implants and grafts: Secondary | ICD-10-CM

## 2018-03-05 DIAGNOSIS — Z79899 Other long term (current) drug therapy: Secondary | ICD-10-CM | POA: Diagnosis not present

## 2018-03-05 DIAGNOSIS — D6481 Anemia due to antineoplastic chemotherapy: Secondary | ICD-10-CM | POA: Insufficient documentation

## 2018-03-05 DIAGNOSIS — C50411 Malignant neoplasm of upper-outer quadrant of right female breast: Secondary | ICD-10-CM | POA: Insufficient documentation

## 2018-03-05 DIAGNOSIS — Z9221 Personal history of antineoplastic chemotherapy: Secondary | ICD-10-CM | POA: Diagnosis not present

## 2018-03-05 MED ORDER — HEPARIN SOD (PORK) LOCK FLUSH 100 UNIT/ML IV SOLN
500.0000 [IU] | Freq: Once | INTRAVENOUS | Status: AC
  Administered 2018-03-05: 500 [IU] via INTRAVENOUS

## 2018-03-05 MED ORDER — SODIUM CHLORIDE 0.9% FLUSH
10.0000 mL | Freq: Once | INTRAVENOUS | Status: AC
  Administered 2018-03-05: 10 mL via INTRAVENOUS
  Filled 2018-03-05: qty 10

## 2018-03-06 ENCOUNTER — Encounter: Payer: Self-pay | Admitting: Oncology

## 2018-03-07 ENCOUNTER — Other Ambulatory Visit: Payer: Self-pay | Admitting: *Deleted

## 2018-03-07 ENCOUNTER — Encounter: Payer: Self-pay | Admitting: *Deleted

## 2018-03-07 DIAGNOSIS — Z171 Estrogen receptor negative status [ER-]: Principal | ICD-10-CM

## 2018-03-07 DIAGNOSIS — C50411 Malignant neoplasm of upper-outer quadrant of right female breast: Secondary | ICD-10-CM

## 2018-03-15 ENCOUNTER — Encounter: Payer: Self-pay | Admitting: Oncology

## 2018-03-15 ENCOUNTER — Other Ambulatory Visit: Payer: Self-pay

## 2018-03-15 ENCOUNTER — Other Ambulatory Visit: Payer: Self-pay | Admitting: Oncology

## 2018-03-15 ENCOUNTER — Inpatient Hospital Stay (HOSPITAL_BASED_OUTPATIENT_CLINIC_OR_DEPARTMENT_OTHER): Payer: Managed Care, Other (non HMO) | Admitting: Oncology

## 2018-03-15 VITALS — BP 123/82 | HR 123 | Temp 97.8°F | Resp 16 | Ht 63.0 in | Wt 198.1 lb

## 2018-03-15 DIAGNOSIS — C50411 Malignant neoplasm of upper-outer quadrant of right female breast: Secondary | ICD-10-CM

## 2018-03-15 DIAGNOSIS — T451X5A Adverse effect of antineoplastic and immunosuppressive drugs, initial encounter: Secondary | ICD-10-CM

## 2018-03-15 DIAGNOSIS — Z79899 Other long term (current) drug therapy: Secondary | ICD-10-CM

## 2018-03-15 DIAGNOSIS — D6481 Anemia due to antineoplastic chemotherapy: Secondary | ICD-10-CM

## 2018-03-15 DIAGNOSIS — Z171 Estrogen receptor negative status [ER-]: Secondary | ICD-10-CM

## 2018-03-15 DIAGNOSIS — Z9221 Personal history of antineoplastic chemotherapy: Secondary | ICD-10-CM

## 2018-03-15 DIAGNOSIS — Z923 Personal history of irradiation: Secondary | ICD-10-CM

## 2018-03-15 MED ORDER — UREA 10 % EX CREA: TOPICAL_CREAM | Freq: Two times a day (BID) | CUTANEOUS | 0 refills | Status: DC

## 2018-03-15 NOTE — Progress Notes (Signed)
Patient here for follow up. She reports that she was short 6 tablets for her last cycle. She  Reports that the pneumonitis symptoms come back as soon as she reduces dose of prednisone.

## 2018-03-18 ENCOUNTER — Encounter: Payer: Self-pay | Admitting: *Deleted

## 2018-03-18 ENCOUNTER — Telehealth: Payer: Self-pay | Admitting: *Deleted

## 2018-03-18 NOTE — Telephone Encounter (Signed)
Dr. Janese Banks wanted me to get acupuncture referral for fiingers and hands numbness/tingling. I called [facility] and they wanted to know if it was going to be on [employer] fund. I told them that I would check with [facility] Silversmith or [place] and let them know.I called [facility] Silversmith and she took patient's name and will call and ask her what provider she would like to use and then make her appt. I have sent pt email to make pt aware.

## 2018-03-18 NOTE — Progress Notes (Signed)
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 Patient is a 57 year old postmenopausal female who had a routine screening mammogram on 03/23/2017 which showed a highly suspicious palpable solid mass in the 10 o'clock position. Mass measures at least 5 cm in maximum diameter for right axillary lymph nodes have sonographic features suspicious for metastatic disease. No evidence of malignancy was noted in the left breast. This was followed by an ultrasound of the right breast including the right axilla which confirmed the same findings.Targeted ultrasound is performed, showing a markedly irregular and hypoechoic mass centered at 10 o'clock position approximately 7 cm from the nipple estimated to be at least 5.2 x 4.4 x 3.4 cm. Small areas of vascular flow are detected within the mass. At 10 o'clock position 9 cm from the nipple again noted is a simple cyst measuring approximately 2.4 cm greatest diameter. Skin thickness of the medial and lateral of right breast measures approximately 4 mm.  Ultrasound of the right axilla demonstrates 4 suspicious lymph nodes with hypoechoic thickened cortices. The largest of these lymph nodes measures 2.2 x 1.2 x 1.0 cm and has a cortical thickness of 4-5 mm.  Patient underwent core biopsy of the right breast mass.  2.  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 Right axillary lymph node biopsy was also consistent with metastatic carcinoma. ERnegativePR5% positive,and HER-2negative by FISH  3. Patient is G3P3L3. Menarche at the age of 16. Menopause at 57. No priro h/o breast biopsies. No family h/o breast cancer. She was known to have right breast cyst being followed by Dr. Bary Castilla. She noticed this amss sometime over the summer and feels this has grown since then. She is healthy and has no significant co-morbidities  4. PET/CT scan on 04/04/17 showed:IMPRESSION: 1. Hypermetabolic right breast mass with hypermetabolic lymph nodes in the right axilla and hypermetabolic metastatic disease in both hilar regions and subcarinal mediastinum. 2. No evidence for hypermetabolic metastatic disease in the neck, abdomen or pelvis  5. Patient underwent bronchoscopy and biopsy of hilar LN which was positive for metastatic carcinoma from the breast. She also underwent RUl biopsy and bronchial washings that were negative for malignancy.Baseline CA-15-3 normal at 16.9 And CA-27-29 normal at 15.8. Patient seen by Dr. Alinda Money at [facility] and plan was to proceed with carbo/taxol followed by pet and if she has a good response proceed to dd [facility] X4 and possible surgery and RT down the line  6.Patient has had significant neutropenia despite dose reduction of carboplatin and adding Neupogen requiring dose interruptions. Plan wastherefore to switch her to every 2 weekly cycle of carbotaxol for the remainder of cycles with on pro-Neulasta support which she completed on 07/17/17  7. PET scan after carbo/taxol showed:Marland Kitchen Interval decrease in size of the right breast mass and significant reduction in hypermetabolism. 2. Resolution of right axillary adenopathy and mediastinal and hilar adenopathy. 3. No findings for metastatic disease involving the abdomen/pelvis.  4. Diffuse osseous hypermetabolism likely due to rebound  from chemotherapy or marrow stimulating drugs.  8. Right breast US showed:Ultrasound examination of the upper outer quadrant of the right breast to reassess tumor response was completed. A dominant cyst remains in the 10 o'clock position, 10 cm from the nipple. Immediately adjacent to this at the 1030 o'clock position is the primary tumor mass now measuring 1.7 x 2.7 x 4.0 cm. This area previously measured 3.7 x 3.7 x 7.2 cm. This represents an approximately 80% decrease in volume. A single mildly prominent right axillary node measuring 0.6 x 0.7 cm with internal vascular flow is noted. BI-RADS-6.Seen by Dr. Alinda Money on 08/13/17 who recommends continuing 4 cycles of dd AC followed by PET and then possible surgery/ RT  9. Patient seen by [facility] team at [facility]. Plan is to proceed with definitive surgery- right lumpectomy and ALND followed by adjuvant RT including RT to the hilar region.  10. Patient underwentRight wire localized partial mastectomy, tissue rearrangement, axillary lymph node dissection and axillary reverse mapping, performed on 10/23/2017. Final pathology showed 16 mm residual tumor. 0/12 Ln positive for malignancy. Grade 2. Negative margins. No LVI. ER, PR and her 2 neu negative. She also completed radiation therapy to her chest wall and hilar region at in August 2019   Interval history-she is now on a steroid taper.  Reports some fatigue and some dry cough still persists.  She has not had fever.  She is tolerating Xeloda well without any nausea or vomiting skin rash or diarrhea  ECOG PS- 0 Pain scale- 0 Opioid associated constipation- no  Review of systems- Review of Systems  Constitutional: Positive for malaise/fatigue. Negative for chills, fever and weight loss.  HENT: Negative for congestion, ear discharge and nosebleeds.   Eyes: Negative for blurred vision.  Respiratory: Negative for cough, hemoptysis, sputum production, shortness of breath and wheezing.   Cardiovascular:  Negative for chest pain, palpitations, orthopnea and claudication.  Gastrointestinal: Negative for abdominal pain, blood in stool, constipation, diarrhea, heartburn, melena, nausea and vomiting.  Genitourinary: Negative for dysuria, flank pain, frequency, hematuria and urgency.  Musculoskeletal: Negative for back pain, joint pain and myalgias.  Skin: Negative for rash.  Neurological: Negative for dizziness, tingling, focal weakness, seizures, weakness and headaches.  Endo/Heme/Allergies: Does not bruise/bleed easily.  Psychiatric/Behavioral: Negative for depression and suicidal ideas. The patient does not have insomnia.        Allergies  Allergen Reactions  . Penicillins Rash     Past Medical History:  Diagnosis Date  . Anemia   . Breast cancer ([place])   . Breast cyst, right    aspirated by Dr. Bary Castilla  . Hyperlipidemia   . Personal history of chemotherapy   . Pre-diabetes      Past Surgical History:  Procedure Laterality Date  . AXILLARY LYMPH NODE BIOPSY Right 04/09/2017   Procedure: AXILLARY LYMPH NODE BIOPSY;  Surgeon: Robert Bellow, MD;  Location: [facility] ORS;  Service: General;  Laterality: Right;  . BREAST BIOPSY Right 03/27/2017   US guided breast mass - invasive mammary carcinoma  . BREAST BIOPSY Right 03/27/2017   Lymph node - metastatic carcinoma  . BREAST BIOPSY Right 04/09/2017   Lymph node   . BREAST CYST ASPIRATION Right 11/2009   Dr. Bary Castilla did FNA  . COLONOSCOPY  2011  . DILATION AND CURETTAGE OF UTERUS     X3  . ENDOBRONCHIAL ULTRASOUND N/A 04/09/2017   Procedure: ENDOBRONCHIAL ULTRASOUND;  Surgeon: Laverle Hobby, MD;  Location: [facility] ORS;  Service: Pulmonary;  Laterality: N/A;  . ENDOMETRIAL ABLATION    . ENDOMETRIAL BIOPSY  09/2009  . PORTACATH PLACEMENT Left 04/09/2017   Procedure: INSERTION PORT-A-CATH;  Surgeon: Robert Bellow, MD;  Location: [facility] ORS;  Service: General;  Laterality: Left;    Social History   Socioeconomic History   . Marital status: Married    Spouse name: Not on file  . Number of children: Not on file  . Years of education: Not on file  . Highest education level: Not on file  Occupational History  . Not on file  Social Needs  . Financial resource strain: Not on file  . Food insecurity:    Worry: Not on file    Inability: Not on file  . Transportation needs:    Medical: Not on file    Non-medical: Not on file  Tobacco Use  . Smoking status: Never Smoker  . Smokeless tobacco: Never Used  Substance and Sexual Activity  . Alcohol use: Yes    Alcohol/week: 2.0 standard drinks    Types: 2 Glasses of wine per week  . Drug use: No  . Sexual activity: Not Currently  Lifestyle  . Physical activity:    Days per week: Not on file    Minutes per session: Not on file  . Stress: Not on file  Relationships  . Social connections:    Talks on phone: Not on file    Gets together: Not on file    Attends religious service: Not on file    Active member of club or organization: Not on file    Attends meetings of clubs or organizations: Not on file    Relationship status: Not on file  . Intimate partner violence:    Fear of current or ex partner: Not on file    Emotionally abused: Not on file    Physically abused: Not on file    Forced sexual activity: Not on file  Other Topics Concern  . Not on file  Social History Narrative  . Not on file    Family History  Problem Relation Age of Onset  . Melanoma Maternal Grandmother 6       currently 73  . Brain cancer Maternal Grandfather 15       unk. type; deceased in 57s  . Melanoma Other 31       mat grandmother's father  . Melanoma Father 61       on head; currently 72  . Prostate cancer Maternal Uncle        3 maternal uncles; dx in 86s  . Breast cancer Other        mat grandfather's sister; dx 62s     Current Outpatient Medications:  .  Biotin 2500 MCG CAPS, Take 5,000 mcg/day by mouth daily., Disp: , Rfl:  .  capecitabine (XELODA)  500 MG tablet, Take 4 tablets (2,000 mg total) by mouth 2 (two) times daily after a meal. Take for 2 weeks on and then 1 week off, Disp: 112 tablet, Rfl: 0 .  Cholecalciferol (VITAMIN D3) 10000 units TABS, Take by mouth 1 day or 1 dose., Disp: , Rfl:  .  Homeopathic Products (UMCKA COLDCARE PO), Take 1 pg/oz/day by mouth 3 (three) times daily., Disp: , Rfl:  .  loratadine (CLARITIN) 10 MG tablet, Take 10 mg by mouth daily., Disp: , Rfl:  .  LYCOPENE PO, Take by mouth., Disp: , Rfl:  .  Multiple Vitamins-Minerals (CENTRUM  SILVER ADULT 50+) TABS, Take 1 tablet by mouth daily., Disp: , Rfl:  .  Multiple Vitamins-Minerals (EMERGEN-C IMMUNE PLUS PO), Take 1 packet by mouth 1 day or 1 dose. With energy, Disp: , Rfl:  .  omega-3 acid ethyl esters (LOVAZA) 1 g capsule, Take by mouth 1 day or 1 dose., Disp: , Rfl:  .  pantoprazole (PROTONIX) 40 MG tablet, TK 1 T PO QD, Disp: , Rfl: 1 .  predniSONE (DELTASONE) 10 MG tablet, Take 1 tablet by mouth daily., Disp: , Rfl: 0 .  urea (CARMOL) 10 % cream, Apply topically 2 (two) times daily., Disp: 71 g, Rfl: 0 No current facility-administered medications for this visit.   Facility-Administered Medications Ordered in Other Visits:  .  Tbo-Filgrastim (GRANIX) injection 480 mcg, 480 mcg, Subcutaneous, Daily, Sindy Guadeloupe, MD, 480 mcg at 05/19/17 1412  Physical exam:  Vitals:   03/15/18 1004 03/15/18 1012  BP:  123/82  Pulse:  (!) 123  Resp: 16   Temp:  97.8 F (36.6 C)  TempSrc:  Tympanic  Weight: 198 lb 1.6 oz (89.9 kg)   Height: 5' 3"  (1.6 m)    Physical Exam  Constitutional: She is oriented to person, place, and time. She appears well-developed and well-nourished.  HENT:  Head: Normocephalic and atraumatic.  Eyes: Pupils are equal, round, and reactive to light. EOM are normal.  Neck: Normal range of motion.  Cardiovascular: Normal rate, regular rhythm and normal heart sounds.  Pulmonary/Chest: Effort normal and breath sounds normal.  Abdominal:  Soft. Bowel sounds are normal.  Neurological: She is alert and oriented to person, place, and time.  Skin: Skin is warm and dry.     CMP Latest Ref Rng & Units 02/22/2018  Glucose 70 - 99 mg/dL 86  BUN 6 - 20 mg/dL 23(H)  Creatinine 0.44 - 1.00 mg/dL 0.84  Sodium 135 - 145 mmol/L 139  Potassium 3.5 - 5.1 mmol/L 3.6  Chloride 98 - 111 mmol/L 106  CO2 22 - 32 mmol/L 26  Calcium 8.9 - 10.3 mg/dL 9.1  Total Protein 6.5 - 8.1 g/dL 6.6  Total Bilirubin 0.3 - 1.2 mg/dL 0.6  Alkaline Phos 38 - 126 U/L 112  AST 15 - 41 U/L 18  ALT 0 - 44 U/L 21   CBC Latest Ref Rng & Units 02/22/2018  WBC 3.6 - 11.0 K/uL 4.7  Hemoglobin 12.0 - 16.0 g/dL 11.1(L)  Hematocrit 35.0 - 47.0 % 33.5(L)  Platelets 150 - 440 K/uL 187     Assessment and plan- Patient is a 57 y.o. 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 This was followed by lumpectomy with axillary lymph node dissection with residual disease noted at the primary site in the right breast of 16 mm. Negative axillary lymph nodes. Hilum was negative on PET. She then went on to receive adjuvant radiation treatment.Adjuvant Xeloda was started on 01/28/2018.  Recent labs from 03/14/2018 showed white count of 3.2, H&H of 10.5/31 platelet count 112.  ANC was 2.7.  CMP was within normal limits.  She has baseline anemia and her hemoglobin has been between 10-11 which is her baseline.  She also has baseline leukopenia since chemotherapy which has not recovered completely and we will continue to monitor that as well.  She will proceed with cycle 3 of adjuvant Xeloda and I will see her back on 04/05/2018 to  be done at [facility] prior to cycle 4.  She does have PET scan scheduled a couple of days prior to her appointment with me which I will review as well.  Patient will also continue to follow-up with radiation  oncology at [facility] for radiation pneumonitis and continue steroid taper per their recommendations   Visit Diagnosis 1. High risk medication use   2. Malignant neoplasm of upper-outer quadrant of right breast in female, estrogen receptor negative ([place])   3. Antineoplastic chemotherapy induced anemia      Dr. Randa Evens, MD, MPH [facility] [phone] 03/18/2018 8:24 AM

## 2018-03-25 ENCOUNTER — Encounter: Payer: Self-pay | Admitting: Oncology

## 2018-03-25 NOTE — Telephone Encounter (Signed)
Ok to move forward with PET as long as her blood sugars are acceptable for PET. Steroids should not otherwise interfere. We should check blood sugar a day or 2 prior to have some time to act on it and not just on the day of PET

## 2018-03-26 ENCOUNTER — Other Ambulatory Visit: Payer: Self-pay | Admitting: Oncology

## 2018-03-26 DIAGNOSIS — C50411 Malignant neoplasm of upper-outer quadrant of right female breast: Secondary | ICD-10-CM

## 2018-03-26 DIAGNOSIS — Z171 Estrogen receptor negative status [ER-]: Principal | ICD-10-CM

## 2018-04-02 ENCOUNTER — Ambulatory Visit
Admission: RE | Admit: 2018-04-02 | Discharge: 2018-04-02 | Disposition: A | Discharge status: Home / Self Care | Payer: Managed Care, Other (non HMO) | Source: Ambulatory Visit | Attending: Oncology | Admitting: Oncology

## 2018-04-02 DIAGNOSIS — C50411 Malignant neoplasm of upper-outer quadrant of right female breast: Secondary | ICD-10-CM | POA: Diagnosis present

## 2018-04-02 DIAGNOSIS — Z171 Estrogen receptor negative status [ER-]: Secondary | ICD-10-CM | POA: Diagnosis present

## 2018-04-04 ENCOUNTER — Other Ambulatory Visit: Payer: Self-pay | Admitting: *Deleted

## 2018-04-04 DIAGNOSIS — C50411 Malignant neoplasm of upper-outer quadrant of right female breast: Secondary | ICD-10-CM

## 2018-04-04 DIAGNOSIS — Z171 Estrogen receptor negative status [ER-]: Principal | ICD-10-CM

## 2018-04-04 MED ORDER — CAPECITABINE 500 MG PO TABS: ORAL_TABLET | ORAL | 0 refills | Status: DC

## 2018-04-05 ENCOUNTER — Encounter: Payer: Self-pay | Admitting: Oncology

## 2018-04-05 ENCOUNTER — Ambulatory Visit: Payer: Managed Care, Other (non HMO) | Admitting: Oncology

## 2018-04-05 ENCOUNTER — Encounter
Admission: RE | Admit: 2018-04-05 | Discharge: 2018-04-05 | Disposition: A | Discharge status: Home / Self Care | Payer: Managed Care, Other (non HMO) | Source: Ambulatory Visit | Attending: Oncology | Admitting: Oncology

## 2018-04-05 ENCOUNTER — Inpatient Hospital Stay: Payer: Managed Care, Other (non HMO) | Attending: Oncology | Admitting: Oncology

## 2018-04-05 VITALS — BP 115/78 | HR 89 | Temp 98.3°F | Resp 18 | Ht 63.0 in | Wt 196.3 lb

## 2018-04-05 DIAGNOSIS — C50911 Malignant neoplasm of unspecified site of right female breast: Secondary | ICD-10-CM | POA: Insufficient documentation

## 2018-04-05 DIAGNOSIS — T451X5D Adverse effect of antineoplastic and immunosuppressive drugs, subsequent encounter: Secondary | ICD-10-CM | POA: Diagnosis not present

## 2018-04-05 DIAGNOSIS — G620 Drug-induced polyneuropathy: Secondary | ICD-10-CM | POA: Insufficient documentation

## 2018-04-05 DIAGNOSIS — D72819 Decreased white blood cell count, unspecified: Secondary | ICD-10-CM | POA: Insufficient documentation

## 2018-04-05 DIAGNOSIS — Z923 Personal history of irradiation: Secondary | ICD-10-CM

## 2018-04-05 DIAGNOSIS — C50411 Malignant neoplasm of upper-outer quadrant of right female breast: Secondary | ICD-10-CM | POA: Insufficient documentation

## 2018-04-05 DIAGNOSIS — Z171 Estrogen receptor negative status [ER-]: Secondary | ICD-10-CM

## 2018-04-05 DIAGNOSIS — G20 Parkinson's disease: Secondary | ICD-10-CM

## 2018-04-05 DIAGNOSIS — C771 Secondary and unspecified malignant neoplasm of intrathoracic lymph nodes: Secondary | ICD-10-CM | POA: Insufficient documentation

## 2018-04-05 DIAGNOSIS — Z9221 Personal history of antineoplastic chemotherapy: Secondary | ICD-10-CM | POA: Diagnosis not present

## 2018-04-05 DIAGNOSIS — T451X5A Adverse effect of antineoplastic and immunosuppressive drugs, initial encounter: Secondary | ICD-10-CM

## 2018-04-05 DIAGNOSIS — C7800 Secondary malignant neoplasm of unspecified lung: Secondary | ICD-10-CM | POA: Insufficient documentation

## 2018-04-05 DIAGNOSIS — Z79899 Other long term (current) drug therapy: Secondary | ICD-10-CM | POA: Diagnosis not present

## 2018-04-05 LAB — GLUCOSE, CAPILLARY: GLUCOSE-CAPILLARY: 87 mg/dL (ref 70–99)

## 2018-04-05 MED ORDER — FLUDEOXYGLUCOSE F - 18 (FDG) INJECTION
10.7100 | Freq: Once | INTRAVENOUS | Status: AC | PRN
  Administered 2018-04-05: 10.71 via INTRAVENOUS

## 2018-04-05 NOTE — Progress Notes (Signed)
No new changes noted today 

## 2018-04-08 NOTE — Progress Notes (Signed)
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 Discuss pet ct results  Heme/Onc history: 1. Patient is a 57 year old postmenopausal female who had a routine screening mammogram on 03/23/2017 which showed a highly suspicious palpable solid mass in the 10 o'clock position. Mass measures at least 5 cm in maximum diameter for right axillary lymph nodes have sonographic features suspicious for metastatic disease. No evidence of malignancy was noted in the left breast. This was followed by an ultrasound of the right breast including the right axilla which confirmed the same findings.Targeted ultrasound is performed, showing a markedly irregular and hypoechoic mass centered at 10 o'clock position approximately 7 cm from the nipple estimated to be at least 5.2 x 4.4 x 3.4 cm. Small areas of vascular flow are detected within the mass. At 10 o'clock position 9 cm from the nipple again noted is a simple cyst measuring approximately 2.4 cm greatest diameter. Skin thickness of the medial and lateral of right breast measures approximately 4 mm.  Ultrasound of the right axilla demonstrates 4 suspicious lymph nodes with hypoechoic thickened cortices. The largest of these lymph nodes measures 2.2 x 1.2 x 1.0 cm and has a cortical thickness of 4-5 mm.  Patient underwent core biopsy of the  right breast mass.  2. 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 Right axillary lymph node biopsy was also consistent with metastatic carcinoma. ERnegativePR5% positive,and HER-2negative by FISH  3. Patient is G3P3L3. Menarche at the age of 34. Menopause at 40. No priro h/o breast biopsies. No family h/o breast cancer. She was known to have right breast cyst being followed by Dr. Bary Castilla. She noticed this amss sometime over the summer and feels this has grown since then. She is healthy and has no significant co-morbidities  4. PET/CT scan on 04/04/17 showed:IMPRESSION: 1. Hypermetabolic right breast mass with hypermetabolic lymph nodes in the right axilla and hypermetabolic metastatic disease in both hilar regions and subcarinal mediastinum. 2. No evidence for hypermetabolic metastatic disease in the neck, abdomen or pelvis  5. Patient underwent bronchoscopy and biopsy of hilar LN which was positive for metastatic carcinoma from the breast. She also underwent RUl biopsy and bronchial washings that were negative for malignancy.Baseline CA-15-3 normal at 16.9 And CA-27-29 normal at 15.8. Patient seen by Dr. Alinda Money at [facility] [address] and plan was to proceed with carbo/taxol followed by pet and if she has a good response proceed to dd [facility] X4 and possible surgery and RT down the line  6.Patient has had significant neutropenia despite dose reduction of carboplatin and adding Neupogen requiring dose interruptions. Plan wastherefore to switch her to every 2 weekly cycle of carbotaxol for the remainder of cycles with on pro-Neulasta support which she completed on 07/17/17  7. PET scan after carbo/taxol showed:Marland Kitchen Interval decrease in size of the right breast mass and significant reduction in hypermetabolism. 2. Resolution of right axillary adenopathy and mediastinal and hilar adenopathy. 3. No findings for metastatic disease  involving the abdomen/pelvis. 4. Diffuse osseous hypermetabolism  likely due to rebound from chemotherapy or marrow stimulating drugs.  8. Right breast US showed:Ultrasound examination of the upper outer quadrant of the right breast to reassess tumor response was completed. A dominant cyst remains in the 10 o'clock position, 10 cm from the nipple. Immediately adjacent to this at the 1030 o'clock position is the primary tumor mass now measuring 1.7 x 2.7 x 4.0 cm. This area previously measured 3.7 x 3.7 x 7.2 cm. This represents an approximately 80% decrease in volume. A single mildly prominent right axillary node measuring 0.6 x 0.7 cm with internal vascular flow is noted. BI-RADS-6.Seen by Dr. Alinda Money on 08/13/17 who recommends continuing 4 cycles of dd AC followed by PET and then possible surgery/ RT  9. Patient seen by [facility] team at [facility]. Plan is to proceed with definitive surgery- right lumpectomy and ALND followed by adjuvant RT including RT to the hilar region.  10. Patient underwentRight wire localized partial mastectomy, tissue rearrangement, axillary lymph node dissection and axillary reverse mapping, performed on 10/23/2017. Final pathology showed 16 mm residual tumor. 0/12 Ln positive for malignancy. Grade 2. Negative margins. No LVI. ER, PR and her 2 neu negative. She also completed radiation therapy to her chest wall and hilar region at in August 2019  Interval history- patient reports worsening tingling numbness in her hands over last couple of weeks. She was also feeling more sob when she came off steroids and has been started back on steroid taper by rad onc at [facility]  ECOG PS- 1 Pain scale- 0 Opioid associated constipation- no  Review of systems- Review of Systems  Constitutional: Positive for malaise/fatigue. Negative for chills, fever and weight loss.  HENT: Negative for congestion, ear discharge and nosebleeds.   Eyes: Negative for blurred vision.  Respiratory: Positive for shortness of breath. Negative for cough, hemoptysis, sputum  production and wheezing.   Cardiovascular: Negative for chest pain, palpitations, orthopnea and claudication.  Gastrointestinal: Negative for abdominal pain, blood in stool, constipation, diarrhea, heartburn, melena, nausea and vomiting.  Genitourinary: Negative for dysuria, flank pain, frequency, hematuria and urgency.  Musculoskeletal: Negative for back pain, joint pain and myalgias.  Skin: Negative for rash.  Neurological: Positive for tingling. Negative for dizziness, focal weakness, seizures, weakness and headaches.  Endo/Heme/Allergies: Does not bruise/bleed easily.  Psychiatric/Behavioral: Negative for depression and suicidal ideas. The patient does not have insomnia.   Breast exam performed in sitting and lying down position.  There is some evidence of induration and scarring around the lumpectomy site.  There are no other palpable masses in either breast or no palpable bilateral axillary adenopathy   Allergies  Allergen Reactions  . Penicillins Rash     Past Medical History:  Diagnosis Date  . Anemia   . Breast cancer ([place])   . Breast cyst, right    aspirated by Dr. Bary Castilla  . Hyperlipidemia   . Personal history of chemotherapy   . Pre-diabetes      Past Surgical History:  Procedure Laterality Date  . AXILLARY LYMPH NODE BIOPSY Right 04/09/2017   Procedure: AXILLARY LYMPH NODE BIOPSY;  Surgeon: Robert Bellow, MD;  Location: [facility] ORS;  Service: General;  Laterality: Right;  . BREAST BIOPSY Right 03/27/2017   US guided breast mass - invasive mammary carcinoma  . BREAST BIOPSY Right 03/27/2017   Lymph node - metastatic carcinoma  . BREAST BIOPSY Right 04/09/2017   Lymph node   . BREAST CYST ASPIRATION Right  11/2009   Dr. Bary Castilla did FNA  . BREAST EXCISIONAL BIOPSY Right 09/28/2017   lumpectomy and 12 lympnode rad neo adj chemo  . COLONOSCOPY  2011  . DILATION AND CURETTAGE OF UTERUS     X3  . ENDOBRONCHIAL ULTRASOUND N/A 04/09/2017   Procedure: ENDOBRONCHIAL  ULTRASOUND;  Surgeon: Laverle Hobby, MD;  Location: [facility] ORS;  Service: Pulmonary;  Laterality: N/A;  . ENDOMETRIAL ABLATION    . ENDOMETRIAL BIOPSY  09/2009  . PORTACATH PLACEMENT Left 04/09/2017   Procedure: INSERTION PORT-A-CATH;  Surgeon: Robert Bellow, MD;  Location: [facility] ORS;  Service: General;  Laterality: Left;    Social History   Socioeconomic History  . Marital status: Married    Spouse name: Not on file  . Number of children: Not on file  . Years of education: Not on file  . Highest education level: Not on file  Occupational History  . Not on file  Social Needs  . Financial resource strain: Not on file  . Food insecurity:    Worry: Not on file    Inability: Not on file  . Transportation needs:    Medical: Not on file    Non-medical: Not on file  Tobacco Use  . Smoking status: Never Smoker  . Smokeless tobacco: Never Used  Substance and Sexual Activity  . Alcohol use: Yes    Alcohol/week: 2.0 standard drinks    Types: 2 Glasses of wine per week  . Drug use: No  . Sexual activity: Not Currently  Lifestyle  . Physical activity:    Days per week: Not on file    Minutes per session: Not on file  . Stress: Not on file  Relationships  . Social connections:    Talks on phone: Not on file    Gets together: Not on file    Attends religious service: Not on file    Active member of club or organization: Not on file    Attends meetings of clubs or organizations: Not on file    Relationship status: Not on file  . Intimate partner violence:    Fear of current or ex partner: Not on file    Emotionally abused: Not on file    Physically abused: Not on file    Forced sexual activity: Not on file  Other Topics Concern  . Not on file  Social History Narrative  . Not on file    Family History  Problem Relation Age of Onset  . Melanoma Maternal Grandmother 62       currently 53  . Brain cancer Maternal Grandfather 15       unk. type; deceased in 58s  .  Melanoma Other 12       mat grandmother's father  . Melanoma Father 89       on head; currently 80  . Prostate cancer Maternal Uncle        3 maternal uncles; dx in 36s  . Breast cancer Other        mat grandfather's sister; dx 26s     Current Outpatient Medications:  .  Biotin 2500 MCG CAPS, Take 5,000 mcg/day by mouth daily., Disp: , Rfl:  .  capecitabine (XELODA) 500 MG tablet, TAKE 4 TABLETS (2000MG TOTAL) BY MOUTH TWICE A DAY AFTER A MEAL FOR 2 WEEKS ON, THEN 1 WEEK OFF, Disp: 112 tablet, Rfl: 0 .  Cholecalciferol (VITAMIN D3) 10000 units TABS, Take by mouth 1 day or 1 dose., Disp: , Rfl:  .  Homeopathic Products (UMCKA COLDCARE PO), Take 1 pg/oz/day by mouth 3 (three) times daily., Disp: , Rfl:  .  loratadine (CLARITIN) 10 MG tablet, Take 10 mg by mouth daily., Disp: , Rfl:  .  LYCOPENE PO, Take by mouth., Disp: , Rfl:  .  Multiple Vitamins-Minerals (CENTRUM SILVER ADULT 50+) TABS, Take 1 tablet by mouth daily., Disp: , Rfl:  .  Multiple Vitamins-Minerals (EMERGEN-C IMMUNE PLUS PO), Take 1 packet by mouth 1 day or 1 dose. With energy, Disp: , Rfl:  .  omega-3 acid ethyl esters (LOVAZA) 1 g capsule, Take by mouth 1 day or 1 dose., Disp: , Rfl:  .  pantoprazole (PROTONIX) 40 MG tablet, TK 1 T PO QD, Disp: , Rfl: 1 .  predniSONE (DELTASONE) 10 MG tablet, Take 1 tablet by mouth daily., Disp: , Rfl: 0 .  urea (CARMOL) 10 % cream, Apply topically 2 (two) times daily. (Patient not taking: Reported on 04/05/2018), Disp: 71 g, Rfl: 0 No current facility-administered medications for this visit.   Facility-Administered Medications Ordered in Other Visits:  .  Tbo-Filgrastim (GRANIX) injection 480 mcg, 480 mcg, Subcutaneous, Daily, Sindy Guadeloupe, MD, 480 mcg at 05/19/17 1412  Physical exam:  Vitals:   04/05/18 1335  BP: 115/78  Pulse: 89  Resp: 18  Temp: 98.3 F (36.8 C)  TempSrc: Tympanic  SpO2: 96%  Weight: 196 lb 4.8 oz (89 kg)  Height: _0  (1.6 m)   Physical Exam    Constitutional: She is oriented to person, place, and time. She appears well-developed and well-nourished.  Steroid facies  HENT:  Head: Normocephalic and atraumatic.  Eyes: Pupils are equal, round, and reactive to light. EOM are normal.  Neck: Normal range of motion.  Cardiovascular: Normal rate, regular rhythm and normal heart sounds.  Pulmonary/Chest: Effort normal and breath sounds normal.  Abdominal: Soft. Bowel sounds are normal.  Neurological: She is alert and oriented to person, place, and time.  Skin: Skin is warm and dry.     CMP Latest Ref Rng & Units 02/22/2018  Glucose 70 - 99 mg/dL 86  BUN 6 - 20 mg/dL 23(H)  Creatinine 0.44 - 1.00 mg/dL 0.84  Sodium 135 - 145 mmol/L 139  Potassium 3.5 - 5.1 mmol/L 3.6  Chloride 98 - 111 mmol/L 106  CO2 22 - 32 mmol/L 26  Calcium 8.9 - 10.3 mg/dL 9.1  Total Protein 6.5 - 8.1 g/dL 6.6  Total Bilirubin 0.3 - 1.2 mg/dL 0.6  Alkaline Phos 38 - 126 U/L 112  AST 15 - 41 U/L 18  ALT 0 - 44 U/L 21   CBC Latest Ref Rng & Units 02/22/2018  WBC 3.6 - 11.0 K/uL 4.7  Hemoglobin 12.0 - 16.0 g/dL 11.1(L)  Hematocrit 35.0 - 47.0 % 33.5(L)  Platelets 150 - 440 K/uL 187    No images are attached to the encounter.  Nm Pet Image Restag (ps) Skull Base To Thigh  Result Date: 04/05/2018 CLINICAL DATA:  Subsequent treatment strategy for metastatic breast cancer. EXAM: NUCLEAR MEDICINE PET SKULL BASE TO THIGH TECHNIQUE: 10.7 mCi F-18 FDG was injected intravenously. Full-ring PET imaging was performed from the skull base to thigh after the radiotracer. CT data was obtained and used for attenuation correction and anatomic localization. Fasting blood glucose: 87 mg/dl COMPARISON:  Multiple exams, including 09/21/2017 FINDINGS: Mediastinal blood pool activity: SUV max 3.3 NECK: No significant abnormal hypermetabolic activity in this region. Incidental CT findings: none CHEST: Scarring in the right axilla with a  2.6 by 1.7 cm axillary lesion with higher  density rim and low-density center continuous with this scarring, possibly a postoperative lesion, maximum SUV 2.6. Scarring at the lumpectomy site, maximum SUV 2.1. The previous right breast lesion had a maximum SUV of 2.6. Right upper lobe perihilar and paramediastinal ground-glass densities noted with associated accentuated metabolic activity, maximum SUV 3.6. There is some confluent right retro hilar airspace opacity measuring approximately 2.2 by 1.5 cm on image 92/3, maximum SUV about 3.6. A 0.9 by 0.8 cm nodule is present in the posterior basal segment left lower lobe image 119/3, new compared to the prior exam, difficult to measure SUV due to proximity to the diaphragm but approximately 3.4. Incidental CT findings: Left Port-A-Cath tip: Cavoatrial junction. Atherosclerotic calcification of the aortic arch. ABDOMEN/PELVIS: A tiny focus the upper border of accentuated activity along of the left hepatic lobe is observed with maximum SUV 6.0, in no appreciable CT correlate. Background hepatic activity SUV 5.0. Incidental CT findings: none SKELETON: No significant abnormal hypermetabolic activity in this region. Incidental CT findings: Lower lumbar degenerative facet arthropathy. IMPRESSION: 1. Accentuated right perihilar and paramediastinal densities with accentuated metabolic activity, probably a manifestation of radiation pneumonitis. Surveillance is suggested. 2. Small nodule with bandlike margins in the posterior basal segment left lower lobe, questionable accentuated metabolic activity. Given the bandlike densities along its margin I would favor this as being inflammatory or benign, but surveillance is recommended. 3. A tiny focus of accentuated activity along the upper border of the left hepatic lobe is probably artifactual. Attention on follow up suggested. 4. Postoperative findings in the right breast and right axilla with low-grade metabolic activity meriting surveillance. 5.  Aortic Atherosclerosis  (ICD10-I70.0). Electronically Signed   By: Van Clines M.D.   On: 04/05/2018 13:17   Mm Diag Breast Tomo Bilateral  Result Date: 04/02/2018 CLINICAL DATA:  57 year old patient treated for right breast cancer. She received neoadjuvant chemotherapy, followed by lumpectomy and radiation. This is her first post treatment mammogram. EXAM: DIGITAL DIAGNOSTIC BILATERAL MAMMOGRAM WITH CAD AND TOMO COMPARISON:  Previous exam(s). ACR Breast Density Category b: There are scattered areas of fibroglandular density. FINDINGS: There are lumpectomy clips with surgical scarring in the upper outer right breast. Scattered coarse dystrophic appearing calcifications are noted in the right breast. No suspicious microcalcifications, mass, or nonsurgical distortion is identified in either breast to suggest malignancy. Mammographic images were processed with CAD. IMPRESSION: First postoperative mammogram. Lumpectomy changes in the upper outer right breast. No evidence of malignancy bilaterally. RECOMMENDATION: Diagnostic mammogram is suggested in 1 year. (Code:[id number]) I have discussed the findings and recommendations with the patient. Results were also provided in writing at the conclusion of the visit. If applicable, a reminder letter will be sent to the patient regarding the next appointment. BI-RADS CATEGORY  2: Benign. Electronically Signed   By: Curlene Dolphin M.D.   On: 04/02/2018 16:01     Assessment and plan- Patient is a 57 y.o. 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 This was followed by lumpectomy with axillary lymph node dissection with residual disease noted at the primary site in the right breast of 16 mm. Negative axillary lymph nodes. Hilum was negative on PET. She then went on to receive adjuvant radiation treatment.Adjuvant Xeloda was started on  01/28/2018.  Treatment assessment prior to cycle 5 of adjuvant xeloda  Labs done at [facility] reviewed.  Her hemoglobin is stable at 10.  She is not neutropenic and platelet counts are normal.  CMP is normal.  She will proceed with cycle 5 of adjuvant Xeloda 2 weeks on 1 week off 2000 mg twice daily.  She does have some worsening neuropathy which can be also seen with Xeloda.  See plan below.  She will call us if she has any worsening side effects after 1 week and in that case I will switch her to 1 week on 1 week off regimen  Chemo-induced peripheral neuropathy: She will be going for acupuncture shortly and if her symptoms do not improve at that point I will consider adding Cymbalta  Breast cancer: I reviewed the results of the PET CT scan which shows postoperative changes in the axilla as well as right breast.  There is some uptake in the hilum which may be reflective of changes of radiation pneumonitis.  There is no evidence of other distant metastatic disease.  Plan is to continue adjuvant Xeloda and complete 8 cycles followed by repeat scans  Radiation pneumonitis: She is on steroid taper per radiation oncology at [facility]   Visit Diagnosis 1. 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 High risk medication use   3. Chemotherapy-induced peripheral neuropathy ([place])      Dr. Randa Evens, MD, MPH [facility] [phone] 04/08/2018 11:43 AM

## 2018-04-24 ENCOUNTER — Encounter: Payer: Self-pay | Admitting: Oncology

## 2018-04-24 ENCOUNTER — Other Ambulatory Visit: Payer: Self-pay | Admitting: *Deleted

## 2018-04-24 DIAGNOSIS — C50411 Malignant neoplasm of upper-outer quadrant of right female breast: Secondary | ICD-10-CM

## 2018-04-24 DIAGNOSIS — Z171 Estrogen receptor negative status [ER-]: Principal | ICD-10-CM

## 2018-04-24 MED ORDER — CAPECITABINE 500 MG PO TABS: ORAL_TABLET | ORAL | 0 refills | Status: DC

## 2018-04-25 ENCOUNTER — Other Ambulatory Visit: Payer: Self-pay | Admitting: *Deleted

## 2018-04-25 DIAGNOSIS — Z171 Estrogen receptor negative status [ER-]: Principal | ICD-10-CM

## 2018-04-25 DIAGNOSIS — C50411 Malignant neoplasm of upper-outer quadrant of right female breast: Secondary | ICD-10-CM

## 2018-04-26 ENCOUNTER — Inpatient Hospital Stay (HOSPITAL_BASED_OUTPATIENT_CLINIC_OR_DEPARTMENT_OTHER): Payer: Managed Care, Other (non HMO) | Admitting: Oncology

## 2018-04-26 ENCOUNTER — Encounter: Payer: Self-pay | Admitting: Oncology

## 2018-04-26 ENCOUNTER — Inpatient Hospital Stay: Payer: Managed Care, Other (non HMO)

## 2018-04-26 VITALS — BP 106/72 | HR 93 | Temp 98.5°F | Resp 18 | Ht 63.0 in | Wt 208.5 lb

## 2018-04-26 DIAGNOSIS — C7800 Secondary malignant neoplasm of unspecified lung: Secondary | ICD-10-CM

## 2018-04-26 DIAGNOSIS — C50411 Malignant neoplasm of upper-outer quadrant of right female breast: Secondary | ICD-10-CM

## 2018-04-26 DIAGNOSIS — Z171 Estrogen receptor negative status [ER-]: Secondary | ICD-10-CM

## 2018-04-26 DIAGNOSIS — G620 Drug-induced polyneuropathy: Secondary | ICD-10-CM

## 2018-04-26 DIAGNOSIS — Z79899 Other long term (current) drug therapy: Secondary | ICD-10-CM

## 2018-04-26 DIAGNOSIS — D72819 Decreased white blood cell count, unspecified: Secondary | ICD-10-CM

## 2018-04-26 DIAGNOSIS — Z923 Personal history of irradiation: Secondary | ICD-10-CM

## 2018-04-26 DIAGNOSIS — Z95828 Presence of other vascular implants and grafts: Secondary | ICD-10-CM

## 2018-04-26 DIAGNOSIS — C771 Secondary and unspecified malignant neoplasm of intrathoracic lymph nodes: Secondary | ICD-10-CM | POA: Diagnosis not present

## 2018-04-26 DIAGNOSIS — Z9221 Personal history of antineoplastic chemotherapy: Secondary | ICD-10-CM

## 2018-04-26 DIAGNOSIS — T451X5A Adverse effect of antineoplastic and immunosuppressive drugs, initial encounter: Secondary | ICD-10-CM

## 2018-04-26 MED ORDER — SODIUM CHLORIDE 0.9% FLUSH
10.0000 mL | Freq: Once | INTRAVENOUS | Status: AC
  Administered 2018-04-26: 10 mL via INTRAVENOUS
  Filled 2018-04-26: qty 10

## 2018-04-26 MED ORDER — HEPARIN SOD (PORK) LOCK FLUSH 100 UNIT/ML IV SOLN
500.0000 [IU] | Freq: Once | INTRAVENOUS | Status: AC
  Administered 2018-04-26: 500 [IU] via INTRAVENOUS
  Filled 2018-04-26: qty 5

## 2018-04-26 NOTE — Progress Notes (Signed)
Tingling noted to bilateral hands and feet

## 2018-04-29 ENCOUNTER — Encounter: Payer: Self-pay | Admitting: Oncology

## 2018-04-29 NOTE — Progress Notes (Signed)
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 Patient is a 57 year old postmenopausal female who had a routine screening mammogram on 03/23/2017 which showed a highly suspicious palpable solid mass in the 10 o'clock position. Mass measures at least 5 cm in maximum diameter for right axillary lymph nodes have sonographic features suspicious for metastatic disease. No evidence of malignancy was noted in the left breast. This was followed by an ultrasound of the right breast including the right axilla which confirmed the same findings.Targeted ultrasound is performed, showing a markedly irregular and hypoechoic mass centered at 10 o'clock position approximately 7 cm from the nipple estimated to be at least 5.2 x 4.4 x 3.4 cm. Small areas of vascular flow are detected within the mass. At 10 o'clock position 9 cm from the nipple again noted is a simple cyst measuring approximately 2.4 cm greatest diameter. Skin thickness of the medial and lateral of right breast measures approximately 4 mm.  Ultrasound of the right axilla demonstrates 4 suspicious lymph nodes with hypoechoic thickened cortices. The largest of these lymph nodes measures 2.2 x 1.2 x 1.0 cm and has a cortical thickness of 4-5 mm.  Patient underwent core biopsy of the right breast mass.  2.  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 Right axillary lymph node biopsy was also consistent with metastatic carcinoma. ERnegativePR5% positive,and HER-2negative by FISH  3. Patient is G3P3L3. Menarche at the age of 57. Menopause at 63. No priro h/o breast biopsies. No family h/o breast cancer. She was known to have right breast cyst being followed by Dr. Bary Castilla. She noticed this amss sometime over the summer and feels this has grown since then. She is healthy and has no significant co-morbidities  4. PET/CT scan on 04/04/17 showed:IMPRESSION: 1. Hypermetabolic right breast mass with hypermetabolic lymph nodes in the right axilla and hypermetabolic metastatic disease in both hilar regions and subcarinal mediastinum. 2. No evidence for hypermetabolic metastatic disease in the neck, abdomen or pelvis  5. Patient underwent bronchoscopy and biopsy of hilar LN which was positive for metastatic carcinoma from the breast. She also underwent RUl biopsy and bronchial washings that were negative for malignancy.Baseline CA-15-3 normal at 16.9 And CA-27-29 normal at 15.8. Patient seen by Dr. Alinda Money at [facility] and plan was to proceed with carbo/taxol followed by pet and if she has a good response proceed to dd [facility] X4 and possible surgery and RT down the line  6.Patient has had significant neutropenia despite dose reduction of carboplatin and adding Neupogen requiring dose interruptions. Plan wastherefore to switch her to every 2 weekly cycle of carbotaxol for the remainder of cycles with on pro-Neulasta support which she completed on 07/17/17  7. PET scan after carbo/taxol showed:Marland Kitchen Interval decrease in size of the right breast mass and significant reduction in hypermetabolism. 2. Resolution of right axillary adenopathy and mediastinal and hilar adenopathy. 3. No findings for metastatic disease involving the abdomen/pelvis. 4. Diffuse osseous  hypermetabolism likely due to rebound  from chemotherapy or marrow stimulating drugs.  8. Right breast US showed:Ultrasound examination of the upper outer quadrant of the right breast to reassess tumor response was completed. A dominant cyst remains in the 10 o'clock position, 10 cm from the nipple. Immediately adjacent to this at the 1030 o'clock position is the primary tumor mass now measuring 1.7 x 2.7 x 4.0 cm. This area previously measured 3.7 x 3.7 x 7.2 cm. This represents an approximately 80% decrease in volume. A single mildly prominent right axillary node measuring 0.6 x 0.7 cm with internal vascular flow is noted. BI-RADS-6.Seen by Dr. Alinda Money on 08/13/17 who recommends continuing 4 cycles of dd AC followed by PET and then possible surgery/ RT  9. Patient seen by [facility] team at [facility]. Plan is to proceed with definitive surgery- right lumpectomy and ALND followed by adjuvant RT including RT to the hilar region.  10. Patient underwentRight wire localized partial mastectomy, tissue rearrangement, axillary lymph node dissection and axillary reverse mapping, performed on 10/23/2017. Final pathology showed 16 mm residual tumor. 0/12 Ln positive for malignancy. Grade 2. Negative margins. No LVI. ER, PR and her 2 neu negative. She also completed radiation therapy to her chest wall and hilar region at in August 2019   Interval history-patient reports that her neuropathy especially in her hands and feet is currently getting worse with Xeloda and was wondering if she could take it 1 week on 1 week of insert of 2 weeks on 1 week off.  Reports that her breathing has somewhat stabilized and she is currently on a steroid taper as prescribed by radiation oncology at [facility]  ECOG PS- 1 Pain scale- 0 Opioid associated constipation- no  Review of systems- Review of Systems  Constitutional: Negative for chills, fever, malaise/fatigue and weight loss.  HENT: Negative for congestion, ear discharge and nosebleeds.   Eyes: Negative for blurred  vision.  Respiratory: Negative for cough, hemoptysis, sputum production, shortness of breath and wheezing.   Cardiovascular: Negative for chest pain, palpitations, orthopnea and claudication.  Gastrointestinal: Negative for abdominal pain, blood in stool, constipation, diarrhea, heartburn, melena, nausea and vomiting.  Genitourinary: Negative for dysuria, flank pain, frequency, hematuria and urgency.  Musculoskeletal: Negative for back pain, joint pain and myalgias.  Skin: Negative for rash.  Neurological: Positive for tingling. Negative for dizziness, focal weakness, seizures, weakness and headaches.  Endo/Heme/Allergies: Does not bruise/bleed easily.  Psychiatric/Behavioral: Negative for depression and suicidal ideas. The patient does not have insomnia.       Allergies  Allergen Reactions  . Penicillins Rash     Past Medical History:  Diagnosis Date  . Anemia   . Breast cancer ([place])   . Breast cyst, right    aspirated by Dr. Bary Castilla  . Hyperlipidemia   . Personal history of chemotherapy   . Pre-diabetes      Past Surgical History:  Procedure Laterality Date  . AXILLARY LYMPH NODE BIOPSY Right 04/09/2017   Procedure: AXILLARY LYMPH NODE BIOPSY;  Surgeon: Robert Bellow, MD;  Location: [facility] ORS;  Service: General;  Laterality: Right;  . BREAST BIOPSY Right 03/27/2017   US guided breast mass - invasive mammary carcinoma  . BREAST BIOPSY Right 03/27/2017   Lymph node - metastatic carcinoma  . BREAST BIOPSY Right 04/09/2017   Lymph node   . BREAST CYST ASPIRATION Right 11/2009   Dr. Bary Castilla did FNA  . BREAST EXCISIONAL BIOPSY Right 09/28/2017   lumpectomy and 12 lympnode rad  neo adj chemo  . COLONOSCOPY  2011  . DILATION AND CURETTAGE OF UTERUS     X3  . ENDOBRONCHIAL ULTRASOUND N/A 04/09/2017   Procedure: ENDOBRONCHIAL ULTRASOUND;  Surgeon: Laverle Hobby, MD;  Location: [facility] ORS;  Service: Pulmonary;  Laterality: N/A;  . ENDOMETRIAL ABLATION    . ENDOMETRIAL  BIOPSY  09/2009  . PORTACATH PLACEMENT Left 04/09/2017   Procedure: INSERTION PORT-A-CATH;  Surgeon: Robert Bellow, MD;  Location: [facility] ORS;  Service: General;  Laterality: Left;    Social History   Socioeconomic History  . Marital status: Married    Spouse name: Not on file  . Number of children: Not on file  . Years of education: Not on file  . Highest education level: Not on file  Occupational History  . Not on file  Social Needs  . Financial resource strain: Not on file  . Food insecurity:    Worry: Not on file    Inability: Not on file  . Transportation needs:    Medical: Not on file    Non-medical: Not on file  Tobacco Use  . Smoking status: Never Smoker  . Smokeless tobacco: Never Used  Substance and Sexual Activity  . Alcohol use: Yes    Alcohol/week: 2.0 standard drinks    Types: 2 Glasses of wine per week  . Drug use: No  . Sexual activity: Not Currently  Lifestyle  . Physical activity:    Days per week: Not on file    Minutes per session: Not on file  . Stress: Not on file  Relationships  . Social connections:    Talks on phone: Not on file    Gets together: Not on file    Attends religious service: Not on file    Active member of club or organization: Not on file    Attends meetings of clubs or organizations: Not on file    Relationship status: Not on file  . Intimate partner violence:    Fear of current or ex partner: Not on file    Emotionally abused: Not on file    Physically abused: Not on file    Forced sexual activity: Not on file  Other Topics Concern  . Not on file  Social History Narrative  . Not on file    Family History  Problem Relation Age of Onset  . Melanoma Maternal Grandmother 17       currently 37  . Brain cancer Maternal Grandfather 64       unk. type; deceased in 26s  . Melanoma Other 61       mat grandmother's father  . Melanoma Father 18       on head; currently 25  . Prostate cancer Maternal Uncle        3  maternal uncles; dx in 63s  . Breast cancer Other        mat grandfather's sister; dx 28s     Current Outpatient Medications:  .  Biotin 2500 MCG CAPS, Take 5,000 mcg/day by mouth daily., Disp: , Rfl:  .  capecitabine (XELODA) 500 MG tablet, TAKE 4 TABLETS (2000MG TOTAL) BY MOUTH TWICE A DAY AFTER A MEAL FOR 2 WEEKS ON, THEN 1 WEEK OFF, Disp: 112 tablet, Rfl: 0 .  Cholecalciferol (VITAMIN D3) 10000 units TABS, Take by mouth 1 day or 1 dose., Disp: , Rfl:  .  Homeopathic Products (UMCKA COLDCARE PO), Take 1 pg/oz/day by mouth 3 (three) times daily., Disp: , Rfl:  .  loratadine (CLARITIN) 10 MG tablet, Take 10 mg by mouth daily., Disp: , Rfl:  .  LYCOPENE PO, Take by mouth., Disp: , Rfl:  .  Multiple Vitamins-Minerals (CENTRUM SILVER ADULT 50+) TABS, Take 1 tablet by mouth daily., Disp: , Rfl:  .  Multiple Vitamins-Minerals (EMERGEN-C IMMUNE PLUS PO), Take 1 packet by mouth 1 day or 1 dose. With energy, Disp: , Rfl:  .  omega-3 acid ethyl esters (LOVAZA) 1 g capsule, Take by mouth 1 day or 1 dose., Disp: , Rfl:  .  pantoprazole (PROTONIX) 40 MG tablet, TK 1 T PO QD, Disp: , Rfl: 1 .  predniSONE (DELTASONE) 10 MG tablet, Take 1 tablet by mouth daily., Disp: , Rfl: 0 .  urea (CARMOL) 10 % cream, Apply topically 2 (two) times daily. (Patient not taking: Reported on 04/05/2018), Disp: 71 g, Rfl: 0 No current facility-administered medications for this visit.   Facility-Administered Medications Ordered in Other Visits:  .  Tbo-Filgrastim (GRANIX) injection 480 mcg, 480 mcg, Subcutaneous, Daily, Sindy Guadeloupe, MD, 480 mcg at 05/19/17 1412  Physical exam:  Vitals:   04/26/18 1430  BP: 106/72  Pulse: 93  Resp: 18  Temp: 98.5 F (36.9 C)  TempSrc: Tympanic  SpO2: 98%  Weight: 208 lb 8 oz (94.6 kg)  Height: 5' 3"  (1.6 m)   Physical Exam  Constitutional: She is oriented to person, place, and time. She appears well-developed and well-nourished.  Steroid facies  HENT:  Head: Normocephalic  and atraumatic.  Eyes: Pupils are equal, round, and reactive to light. EOM are normal.  Neck: Normal range of motion.  Cardiovascular: Normal rate, regular rhythm and normal heart sounds.  Pulmonary/Chest: Effort normal and breath sounds normal.  Abdominal: Soft. Bowel sounds are normal.  Musculoskeletal:  Erythema noted over b/l hands and feet  Neurological: She is alert and oriented to person, place, and time.  Skin: Skin is warm and dry.     CMP Latest Ref Rng & Units 02/22/2018  Glucose 70 - 99 mg/dL 86  BUN 6 - 20 mg/dL 23(H)  Creatinine 0.44 - 1.00 mg/dL 0.84  Sodium 135 - 145 mmol/L 139  Potassium 3.5 - 5.1 mmol/L 3.6  Chloride 98 - 111 mmol/L 106  CO2 22 - 32 mmol/L 26  Calcium 8.9 - 10.3 mg/dL 9.1  Total Protein 6.5 - 8.1 g/dL 6.6  Total Bilirubin 0.3 - 1.2 mg/dL 0.6  Alkaline Phos 38 - 126 U/L 112  AST 15 - 41 U/L 18  ALT 0 - 44 U/L 21   CBC Latest Ref Rng & Units 02/22/2018  WBC 3.6 - 11.0 K/uL 4.7  Hemoglobin 12.0 - 16.0 g/dL 11.1(L)  Hematocrit 35.0 - 47.0 % 33.5(L)  Platelets 150 - 440 K/uL 187    No images are attached to the encounter.  Nm Pet Image Restag (ps) Skull Base To Thigh  Result Date: 04/05/2018 CLINICAL DATA:  Subsequent treatment strategy for metastatic breast cancer. EXAM: NUCLEAR MEDICINE PET SKULL BASE TO THIGH TECHNIQUE: 10.7 mCi F-18 FDG was injected intravenously. Full-ring PET imaging was performed from the skull base to thigh after the radiotracer. CT data was obtained and used for attenuation correction and anatomic localization. Fasting blood glucose: 87 mg/dl COMPARISON:  Multiple exams, including 09/21/2017 FINDINGS: Mediastinal blood pool activity: SUV max 3.3 NECK: No significant abnormal hypermetabolic activity in this region. Incidental CT findings: none CHEST: Scarring in the right axilla with a 2.6 by 1.7 cm axillary lesion with higher density rim and  low-density center continuous with this scarring, possibly a postoperative lesion,  maximum SUV 2.6. Scarring at the lumpectomy site, maximum SUV 2.1. The previous right breast lesion had a maximum SUV of 2.6. Right upper lobe perihilar and paramediastinal ground-glass densities noted with associated accentuated metabolic activity, maximum SUV 3.6. There is some confluent right retro hilar airspace opacity measuring approximately 2.2 by 1.5 cm on image 92/3, maximum SUV about 3.6. A 0.9 by 0.8 cm nodule is present in the posterior basal segment left lower lobe image 119/3, new compared to the prior exam, difficult to measure SUV due to proximity to the diaphragm but approximately 3.4. Incidental CT findings: Left Port-A-Cath tip: Cavoatrial junction. Atherosclerotic calcification of the aortic arch. ABDOMEN/PELVIS: A tiny focus the upper border of accentuated activity along of the left hepatic lobe is observed with maximum SUV 6.0, in no appreciable CT correlate. Background hepatic activity SUV 5.0. Incidental CT findings: none SKELETON: No significant abnormal hypermetabolic activity in this region. Incidental CT findings: Lower lumbar degenerative facet arthropathy. IMPRESSION: 1. Accentuated right perihilar and paramediastinal densities with accentuated metabolic activity, probably a manifestation of radiation pneumonitis. Surveillance is suggested. 2. Small nodule with bandlike margins in the posterior basal segment left lower lobe, questionable accentuated metabolic activity. Given the bandlike densities along its margin I would favor this as being inflammatory or benign, but surveillance is recommended. 3. A tiny focus of accentuated activity along the upper border of the left hepatic lobe is probably artifactual. Attention on follow up suggested. 4. Postoperative findings in the right breast and right axilla with low-grade metabolic activity meriting surveillance. 5.  Aortic Atherosclerosis (ICD10-I70.0). Electronically Signed   By: Van Clines M.D.   On: 04/05/2018 13:17   Mm Diag  Breast Tomo Bilateral  Result Date: 04/02/2018 CLINICAL DATA:  57 year old patient treated for right breast cancer. She received neoadjuvant chemotherapy, followed by lumpectomy and radiation. This is her first post treatment mammogram. EXAM: DIGITAL DIAGNOSTIC BILATERAL MAMMOGRAM WITH CAD AND TOMO COMPARISON:  Previous exam(s). ACR Breast Density Category b: There are scattered areas of fibroglandular density. FINDINGS: There are lumpectomy clips with surgical scarring in the upper outer right breast. Scattered coarse dystrophic appearing calcifications are noted in the right breast. No suspicious microcalcifications, mass, or nonsurgical distortion is identified in either breast to suggest malignancy. Mammographic images were processed with CAD. IMPRESSION: First postoperative mammogram. Lumpectomy changes in the upper outer right breast. No evidence of malignancy bilaterally. RECOMMENDATION: Diagnostic mammogram is suggested in 1 year. (Code:[id number]) I have discussed the findings and recommendations with the patient. Results were also provided in writing at the conclusion of the visit. If applicable, a reminder letter will be sent to the patient regarding the next appointment. BI-RADS CATEGORY  2: Benign. Electronically Signed   By: Curlene Dolphin M.D.   On: 04/02/2018 16:01     Assessment and plan- Patient is a 57 y.o. 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 This was followed by lumpectomy with axillary lymph node dissection with residual disease noted at the primary site in the right breast of 16 mm. Negative axillary lymph nodes. Hilum was negative on PET. She then went on to receive adjuvant radiation treatment.Adjuvant Xeloda was started on 01/28/2018.  She is here for on treatment assessment prior to cycle 6 of adjuvant Xeloda  She is mildly  leukopenic today and her ANC is 1.7.  She can proceed with cycle 6 of adjuvant Xeloda however we will switch her to a 1 week on 1 week off regimen especially since her neuropathy also seems to be getting worse.  I will repeat a CBC with differential on 05/09/2018 before she starts week 3 of cycle 6.  I will see her back on 05/24/2018 with labs done prior at [facility] before she starts cycle 7.  Plan is to complete 8 cycles followed by repeat scans  Chemo-induced peripheral neuropathy: She is currently going through acupuncture and is already on Cymbalta.  Continue to monitor   Visit Diagnosis 1. High risk medication use   2. Malignant neoplasm of upper-outer quadrant of right breast in female, estrogen receptor negative ([place])   3. Chemotherapy-induced peripheral neuropathy ([place])      Dr. Randa Evens, MD, MPH [facility] [phone] 04/29/2018 12:46 PM

## 2018-04-30 ENCOUNTER — Telehealth: Payer: Self-pay | Admitting: *Deleted

## 2018-04-30 ENCOUNTER — Other Ambulatory Visit: Payer: Self-pay | Admitting: *Deleted

## 2018-04-30 ENCOUNTER — Encounter: Payer: Self-pay | Admitting: *Deleted

## 2018-04-30 NOTE — Telephone Encounter (Signed)
Called patient and she said she should start med on 11/28 and I checked with Janese Banks about when to get next labs and she wants it 12/10 and then again 12/19. I have sent a patient email to the patient with all details

## 2018-04-30 NOTE — Telephone Encounter (Signed)
Patient called stating that her medicine has not arrived and would like a return call from [place] to discuss strategies of how to get it. Please return her call [phone]

## 2018-05-09 ENCOUNTER — Telehealth: Payer: Self-pay | Admitting: *Deleted

## 2018-05-09 NOTE — Telephone Encounter (Signed)
Called patient to ask if she was still on steroids.  Patient states that she has 1 week on Saturday left to take.  While she is on the steroids she is also taking pantoprazole.  Dr. Janese Banks wanted me to let her know that while she is on pantoprazole that it has a possibility of decreasing the efficacy of the Xeloda.  Dr. Janese Banks would like her to take her prednisone and the pantoprazole but as soon as she is finished with steroids to please stop using the pantoprazole.  Patient is agreeable to this plan.  Also she started her new cycle on November 27 in the p.m. and so I told her since she is going to weeks on and one-week off to get her labs drawn on December 16 and then she will come in and see [place] on December 20. patient already has appointment scheduled and she is agreeable to the plan

## 2018-05-10 ENCOUNTER — Other Ambulatory Visit: Payer: Self-pay | Admitting: Oncology

## 2018-05-10 DIAGNOSIS — C50411 Malignant neoplasm of upper-outer quadrant of right female breast: Secondary | ICD-10-CM

## 2018-05-10 DIAGNOSIS — Z171 Estrogen receptor negative status [ER-]: Principal | ICD-10-CM

## 2018-05-17 ENCOUNTER — Encounter: Payer: Self-pay | Admitting: Oncology

## 2018-05-20 ENCOUNTER — Encounter: Payer: Self-pay | Admitting: Oncology

## 2018-05-20 ENCOUNTER — Inpatient Hospital Stay: Payer: Managed Care, Other (non HMO) | Attending: Oncology | Admitting: Oncology

## 2018-05-20 VITALS — BP 111/74 | HR 83 | Temp 96.8°F | Resp 16 | Wt 214.8 lb

## 2018-05-20 DIAGNOSIS — C7800 Secondary malignant neoplasm of unspecified lung: Secondary | ICD-10-CM | POA: Diagnosis not present

## 2018-05-20 DIAGNOSIS — C771 Secondary and unspecified malignant neoplasm of intrathoracic lymph nodes: Secondary | ICD-10-CM | POA: Insufficient documentation

## 2018-05-20 DIAGNOSIS — C50411 Malignant neoplasm of upper-outer quadrant of right female breast: Secondary | ICD-10-CM | POA: Diagnosis not present

## 2018-05-20 DIAGNOSIS — Z923 Personal history of irradiation: Secondary | ICD-10-CM | POA: Insufficient documentation

## 2018-05-20 DIAGNOSIS — D6481 Anemia due to antineoplastic chemotherapy: Secondary | ICD-10-CM | POA: Diagnosis not present

## 2018-05-20 DIAGNOSIS — C50911 Malignant neoplasm of unspecified site of right female breast: Secondary | ICD-10-CM

## 2018-05-20 DIAGNOSIS — R5383 Other fatigue: Secondary | ICD-10-CM

## 2018-05-20 DIAGNOSIS — Z171 Estrogen receptor negative status [ER-]: Secondary | ICD-10-CM | POA: Diagnosis not present

## 2018-05-20 DIAGNOSIS — T451X5A Adverse effect of antineoplastic and immunosuppressive drugs, initial encounter: Secondary | ICD-10-CM

## 2018-05-20 DIAGNOSIS — Z79899 Other long term (current) drug therapy: Secondary | ICD-10-CM | POA: Insufficient documentation

## 2018-05-20 DIAGNOSIS — G620 Drug-induced polyneuropathy: Secondary | ICD-10-CM | POA: Insufficient documentation

## 2018-05-20 DIAGNOSIS — R0602 Shortness of breath: Secondary | ICD-10-CM | POA: Diagnosis not present

## 2018-05-20 NOTE — Progress Notes (Signed)
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 Patient is a 57 year old postmenopausal female who had a routine screening mammogram on 03/23/2017 which showed a highly suspicious palpable solid mass in the 10 o'clock position. Mass measures at least 5 cm in maximum diameter for right axillary lymph nodes have sonographic features suspicious for metastatic disease. No evidence of malignancy was noted in the left breast. This was followed by an ultrasound of the right breast including the right axilla which confirmed the same findings.Targeted ultrasound is performed, showing a markedly irregular and hypoechoic mass centered at 10 o'clock position approximately 7 cm from the nipple estimated to be at least 5.2 x 4.4 x 3.4 cm. Small areas of vascular flow are detected within the mass. At 10 o'clock position 9 cm from the nipple again noted is a simple cyst measuring approximately 2.4 cm greatest diameter. Skin thickness of the medial and lateral of right breast measures approximately 4 mm.  Ultrasound of the right axilla demonstrates 4 suspicious lymph nodes with hypoechoic thickened cortices. The largest of these lymph nodes measures 2.2 x 1.2 x 1.0 cm and has a cortical thickness of 4-5 mm.  Patient underwent core biopsy of the right breast mass.  2.  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 Right axillary lymph node biopsy was also consistent with metastatic carcinoma. ERnegativePR5% positive,and HER-2negative by FISH  3. Patient is G3P3L3. Menarche at the age of 57. Menopause at 39. No priro h/o breast biopsies. No family h/o breast cancer. She was known to have right breast cyst being followed by Dr. Bary Castilla. She noticed this amss sometime over the summer and feels this has grown since then. She is healthy and has no significant co-morbidities  4. PET/CT scan on 04/04/17 showed:IMPRESSION: 1. Hypermetabolic right breast mass with hypermetabolic lymph nodes in the right axilla and hypermetabolic metastatic disease in both hilar regions and subcarinal mediastinum. 2. No evidence for hypermetabolic metastatic disease in the neck, abdomen or pelvis  5. Patient underwent bronchoscopy and biopsy of hilar LN which was positive for metastatic carcinoma from the breast. She also underwent RUl biopsy and bronchial washings that were negative for malignancy.Baseline CA-15-3 normal at 16.9 And CA-27-29 normal at 15.8. Patient seen by Dr. Alinda Money at [facility] and plan was to proceed with carbo/taxol followed by pet and if she has a good response proceed to dd [facility] X4 and possible surgery and RT down the line  6.Patient has had significant neutropenia despite dose reduction of carboplatin and adding Neupogen requiring dose interruptions. Plan wastherefore to switch her to every 2 weekly cycle of carbotaxol for the remainder of cycles with on pro-Neulasta support which she completed on 07/17/17  7. PET scan after carbo/taxol showed:Marland Kitchen Interval decrease in size of the right breast mass and significant reduction in hypermetabolism. 2. Resolution of right axillary adenopathy and mediastinal and hilar adenopathy. 3. No findings for metastatic disease involving the abdomen/pelvis. 4. Diffuse  osseous hypermetabolism likely due to rebound  from chemotherapy or marrow stimulating drugs.  8. Right breast US showed:Ultrasound examination of the upper outer quadrant of the right breast to reassess tumor response was completed. A dominant cyst remains in the 10 o'clock position, 10 cm from the nipple. Immediately adjacent to this at the 1030 o'clock position is the primary tumor mass now measuring 1.7 x 2.7 x 4.0 cm. This area previously measured 3.7 x 3.7 x 7.2 cm. This represents an approximately 80% decrease in volume. A single mildly prominent right axillary node measuring 0.6 x 0.7 cm with internal vascular flow is noted. BI-RADS-6.Seen by Dr. Alinda Money on 08/13/17 who recommends continuing 4 cycles of dd AC followed by PET and then possible surgery/ RT  9. Patient seen by [facility] team at [facility]. Plan is to proceed with definitive surgery- right lumpectomy and ALND followed by adjuvant RT including RT to the hilar region.  10. Patient underwentRight wire localized partial mastectomy, tissue rearrangement, axillary lymph node dissection and axillary reverse mapping, performed on 10/23/2017. Final pathology showed 16 mm residual tumor. 0/12 Ln positive for malignancy. Grade 2. Negative margins. No LVI. ER, PR and her 2 neu negative. She also completed radiation therapy to her chest wall and hilar region at in August 2019   Interval history-she is now off steroids.  Her shortness of breath has improved.  Although she does feel some fatigue and mild shortness of breath when she lives 2 flights of stairs.  Her appetite is good and she denies any unintentional weight loss.  Currently undergoing acupuncture for her peripheral neuropathy.  She reports that her feet are especially sore during the weeks that she is on Xeloda but improves when she comes off it.  She did her cycle 5 of Xeloda 2 weeks on 1 week off instead of 1 week on 1 week off but would like to try the 1 week on 1 week off regimen with cycle 6  ECOG PS- 1 Pain scale- 3 Opioid  associated constipation- n0  Review of systems- Review of Systems  Constitutional: Positive for malaise/fatigue.  Neurological: Positive for sensory change.      Allergies  Allergen Reactions  . Penicillins Rash     Past Medical History:  Diagnosis Date  . Anemia   . Breast cancer ([place])   . Breast cyst, right    aspirated by Dr. Bary Castilla  . Hyperlipidemia   . Personal history of chemotherapy   . Pre-diabetes      Past Surgical History:  Procedure Laterality Date  . AXILLARY LYMPH NODE BIOPSY Right 04/09/2017   Procedure: AXILLARY LYMPH NODE BIOPSY;  Surgeon: Robert Bellow, MD;  Location: [facility] ORS;  Service: General;  Laterality: Right;  . BREAST BIOPSY Right 03/27/2017   US guided breast mass - invasive mammary carcinoma  . BREAST BIOPSY Right 03/27/2017   Lymph node - metastatic carcinoma  . BREAST BIOPSY Right 04/09/2017   Lymph node   . BREAST CYST ASPIRATION Right 11/2009   Dr. Bary Castilla did FNA  . BREAST EXCISIONAL BIOPSY Right 09/28/2017   lumpectomy and 12 lympnode rad neo adj chemo  . COLONOSCOPY  2011  . DILATION AND CURETTAGE OF UTERUS     X3  . ENDOBRONCHIAL ULTRASOUND N/A 04/09/2017   Procedure: ENDOBRONCHIAL ULTRASOUND;  Surgeon: Laverle Hobby, MD;  Location: [facility] ORS;  Service: Pulmonary;  Laterality: N/A;  . ENDOMETRIAL ABLATION    . ENDOMETRIAL BIOPSY  09/2009  . PORTACATH PLACEMENT Left 04/09/2017  Procedure: INSERTION PORT-A-CATH;  Surgeon: Robert Bellow, MD;  Location: [facility] ORS;  Service: General;  Laterality: Left;    Social History   Socioeconomic History  . Marital status: Married    Spouse name: Not on file  . Number of children: Not on file  . Years of education: Not on file  . Highest education level: Not on file  Occupational History  . Not on file  Social Needs  . Financial resource strain: Not on file  . Food insecurity:    Worry: Not on file    Inability: Not on file  . Transportation needs:    Medical: Not  on file    Non-medical: Not on file  Tobacco Use  . Smoking status: Never Smoker  . Smokeless tobacco: Never Used  Substance and Sexual Activity  . Alcohol use: Yes    Alcohol/week: 2.0 standard drinks    Types: 2 Glasses of wine per week  . Drug use: No  . Sexual activity: Not Currently  Lifestyle  . Physical activity:    Days per week: Not on file    Minutes per session: Not on file  . Stress: Not on file  Relationships  . Social connections:    Talks on phone: Not on file    Gets together: Not on file    Attends religious service: Not on file    Active member of club or organization: Not on file    Attends meetings of clubs or organizations: Not on file    Relationship status: Not on file  . Intimate partner violence:    Fear of current or ex partner: Not on file    Emotionally abused: Not on file    Physically abused: Not on file    Forced sexual activity: Not on file  Other Topics Concern  . Not on file  Social History Narrative  . Not on file    Family History  Problem Relation Age of Onset  . Melanoma Maternal Grandmother 107       currently 54  . Brain cancer Maternal Grandfather 57       unk. type; deceased in 34s  . Melanoma Other 51       mat grandmother's father  . Melanoma Father 28       on head; currently 53  . Prostate cancer Maternal Uncle        3 maternal uncles; dx in 52s  . Breast cancer Other        mat grandfather's sister; dx 29s     Current Outpatient Medications:  .  Biotin 2500 MCG CAPS, Take 5,000 mcg/day by mouth daily., Disp: , Rfl:  .  capecitabine (XELODA) 500 MG tablet, TAKE 4 TABLETS BY MOUTH  TWICE A DAY AFTER A MEAL  FOR 2 WEEKS ON, THEN 1 WEEK OFF, Disp: 112 tablet, Rfl: 0 .  Cholecalciferol (VITAMIN D3) 10000 units TABS, Take by mouth 1 day or 1 dose., Disp: , Rfl:  .  Multiple Vitamins-Minerals (CENTRUM SILVER ADULT 50+) TABS, Take 1 tablet by mouth daily., Disp: , Rfl:  .  omega-3 acid ethyl esters (LOVAZA) 1 g capsule,  Take by mouth 1 day or 1 dose., Disp: , Rfl:  .  urea (CARMOL) 10 % cream, Apply topically 2 (two) times daily., Disp: 71 g, Rfl: 0 .  loratadine (CLARITIN) 10 MG tablet, Take 10 mg by mouth daily., Disp: , Rfl:  No current facility-administered medications for this visit.   Facility-Administered  Medications Ordered in Other Visits:  .  Tbo-Filgrastim (GRANIX) injection 480 mcg, 480 mcg, Subcutaneous, Daily, Sindy Guadeloupe, MD, 480 mcg at 05/19/17 1412  Physical exam:  Vitals:   05/20/18 1125  BP: 111/74  Pulse: 83  Resp: 16  Temp: (!) 96.8 F (36 C)  TempSrc: Tympanic  Weight: 214 lb 13.4 oz (97.4 kg)   Physical Exam Constitutional:      Appearance: She is obese.     Comments: Steroid facies.  HENT:     Head: Normocephalic and atraumatic.  Eyes:     Pupils: Pupils are equal, round, and reactive to light.  Neck:     Musculoskeletal: Normal range of motion.  Cardiovascular:     Rate and Rhythm: Normal rate and regular rhythm.     Heart sounds: Normal heart sounds.  Pulmonary:     Effort: Pulmonary effort is normal.     Breath sounds: Normal breath sounds.  Abdominal:     General: Bowel sounds are normal.     Palpations: Abdomen is soft.  Skin:    General: Skin is warm and dry.  Neurological:     Mental Status: She is alert and oriented to person, place, and time.      CMP Latest Ref Rng & Units 02/22/2018  Glucose 70 - 99 mg/dL 86  BUN 6 - 20 mg/dL 23(H)  Creatinine 0.44 - 1.00 mg/dL 0.84  Sodium 135 - 145 mmol/L 139  Potassium 3.5 - 5.1 mmol/L 3.6  Chloride 98 - 111 mmol/L 106  CO2 22 - 32 mmol/L 26  Calcium 8.9 - 10.3 mg/dL 9.1  Total Protein 6.5 - 8.1 g/dL 6.6  Total Bilirubin 0.3 - 1.2 mg/dL 0.6  Alkaline Phos 38 - 126 U/L 112  AST 15 - 41 U/L 18  ALT 0 - 44 U/L 21   CBC Latest Ref Rng & Units 02/22/2018  WBC 3.6 - 11.0 K/uL 4.7  Hemoglobin 12.0 - 16.0 g/dL 11.1(L)  Hematocrit 35.0 - 47.0 % 33.5(L)  Platelets 150 - 440 K/uL 187    No images are  attached to the encounter.  No results found.   Assessment and plan- Patient is a 57 y.o. 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 This was followed by lumpectomy with axillary lymph node dissection with residual disease noted at the primary site in the right breast of 16 mm. Negative axillary lymph nodes. Hilum was negative on PET. She then went on to receive adjuvant radiation treatment.Adjuvant Xeloda was started on 01/28/2018.  He is here for on treatment assessment prior to cycle 6 of adjuvant Xeloda  Labs done at [facility] on 05/17/2018 showed white count of 4.2 with an ANC of 2.9, H&H of 9.9/29.7 and a platelet count of 175.  LFTs were normal except for a mildly elevated ALT of 34.  Counts are therefore okay to proceed with cycle 6 of adjuvant Xeloda which she will start on 05/22/2018 and she will take it 1 week on 1 week off.  I will see her back on 06/17/2018 in [place] prior to the start of cycle 7 obtain labs at [facility] on 06/13/2018.  Plan is to complete 8 cycles followed by repeat scans  Chemo-induced peripheral neuropathy: Currently undergoing acupuncture.  She would like to hold off on starting medications like Cymbalta.  If her symptoms do not improve she will consider  doing it in the future   Visit Diagnosis 1. High risk medication use   2. 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 Chemotherapy-induced peripheral neuropathy ([place])   4. Antineoplastic chemotherapy induced anemia      Dr. Randa Evens, MD, MPH [facility] at [employer] [phone] 05/20/2018 11:54 AM

## 2018-05-20 NOTE — Progress Notes (Signed)
Pt here for follow up. Denies any complaints at this time. 

## 2018-05-24 ENCOUNTER — Ambulatory Visit: Payer: Managed Care, Other (non HMO) | Admitting: Oncology

## 2018-05-28 ENCOUNTER — Other Ambulatory Visit: Payer: Self-pay | Admitting: Oncology

## 2018-05-28 DIAGNOSIS — Z171 Estrogen receptor negative status [ER-]: Principal | ICD-10-CM

## 2018-05-28 DIAGNOSIS — C50411 Malignant neoplasm of upper-outer quadrant of right female breast: Secondary | ICD-10-CM

## 2018-06-14 ENCOUNTER — Encounter: Payer: Self-pay | Admitting: Oncology

## 2018-06-17 ENCOUNTER — Encounter: Payer: Self-pay | Admitting: Oncology

## 2018-06-17 ENCOUNTER — Inpatient Hospital Stay: Payer: Managed Care, Other (non HMO)

## 2018-06-17 ENCOUNTER — Inpatient Hospital Stay: Payer: Managed Care, Other (non HMO) | Attending: Oncology | Admitting: Oncology

## 2018-06-17 VITALS — BP 112/76 | HR 90 | Temp 97.9°F | Resp 18 | Ht 63.0 in | Wt 218.7 lb

## 2018-06-17 DIAGNOSIS — G620 Drug-induced polyneuropathy: Secondary | ICD-10-CM | POA: Insufficient documentation

## 2018-06-17 DIAGNOSIS — Z171 Estrogen receptor negative status [ER-]: Secondary | ICD-10-CM | POA: Diagnosis not present

## 2018-06-17 DIAGNOSIS — Z79899 Other long term (current) drug therapy: Secondary | ICD-10-CM | POA: Insufficient documentation

## 2018-06-17 DIAGNOSIS — Z95828 Presence of other vascular implants and grafts: Secondary | ICD-10-CM

## 2018-06-17 DIAGNOSIS — J700 Acute pulmonary manifestations due to radiation: Secondary | ICD-10-CM | POA: Diagnosis not present

## 2018-06-17 DIAGNOSIS — C50411 Malignant neoplasm of upper-outer quadrant of right female breast: Secondary | ICD-10-CM | POA: Insufficient documentation

## 2018-06-17 DIAGNOSIS — D701 Agranulocytosis secondary to cancer chemotherapy: Secondary | ICD-10-CM | POA: Insufficient documentation

## 2018-06-17 DIAGNOSIS — Z923 Personal history of irradiation: Secondary | ICD-10-CM | POA: Diagnosis not present

## 2018-06-17 DIAGNOSIS — Z9221 Personal history of antineoplastic chemotherapy: Secondary | ICD-10-CM | POA: Diagnosis not present

## 2018-06-17 DIAGNOSIS — T451X5A Adverse effect of antineoplastic and immunosuppressive drugs, initial encounter: Secondary | ICD-10-CM

## 2018-06-17 DIAGNOSIS — R0602 Shortness of breath: Secondary | ICD-10-CM | POA: Diagnosis not present

## 2018-06-17 DIAGNOSIS — R5383 Other fatigue: Secondary | ICD-10-CM | POA: Diagnosis not present

## 2018-06-17 MED ORDER — HEPARIN SOD (PORK) LOCK FLUSH 100 UNIT/ML IV SOLN
500.0000 [IU] | Freq: Once | INTRAVENOUS | Status: AC
  Administered 2018-06-17: 500 [IU] via INTRAVENOUS

## 2018-06-17 MED ORDER — SODIUM CHLORIDE 0.9% FLUSH
10.0000 mL | Freq: Once | INTRAVENOUS | Status: AC
  Administered 2018-06-17: 10 mL via INTRAVENOUS
  Filled 2018-06-17: qty 10

## 2018-06-17 MED ORDER — HEPARIN SOD (PORK) LOCK FLUSH 100 UNIT/ML IV SOLN
INTRAVENOUS | Status: AC
  Filled 2018-06-17: qty 5

## 2018-06-17 NOTE — Progress Notes (Signed)
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 Patient is a 58 year old postmenopausal female who had a routine screening mammogram on 03/23/2017 which showed a highly suspicious palpable solid mass in the 10 o'clock position. Mass measures at least 5 cm in maximum diameter for right axillary lymph nodes have sonographic features suspicious for metastatic disease. No evidence of malignancy was noted in the left breast. This was followed by an ultrasound of the right breast including the right axilla which confirmed the same findings.Targeted ultrasound is performed, showing a markedly irregular and hypoechoic mass centered at 10 o'clock position approximately 7 cm from the nipple estimated to be at least 5.2 x 4.4 x 3.4 cm. Small areas of vascular flow are detected within the mass. At 10 o'clock position 9 cm from the nipple again noted is a simple cyst measuring approximately 2.4 cm greatest diameter. Skin thickness of the medial and lateral of right breast measures approximately 4 mm.  Ultrasound of the right axilla demonstrates 4 suspicious lymph nodes with hypoechoic thickened cortices. The largest of these lymph nodes measures 2.2 x 1.2 x 1.0 cm and has a cortical thickness of 4-5 mm.  Patient underwent core biopsy of the right breast mass.  2.  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 Right axillary lymph node biopsy was also consistent with metastatic carcinoma. ERnegativePR5% positive,and HER-2negative by FISH  3. Patient is G3P3L3. Menarche at the age of 79. Menopause at 57. No priro h/o breast biopsies. No family h/o breast cancer. She was known to have right breast cyst being followed by Dr. Bary Castilla. She noticed this amss sometime over the summer and feels this has grown since then. She is healthy and has no significant co-morbidities  4. PET/CT scan on 04/04/17 showed:IMPRESSION: 1. Hypermetabolic right breast mass with hypermetabolic lymph nodes in the right axilla and hypermetabolic metastatic disease in both hilar regions and subcarinal mediastinum. 2. No evidence for hypermetabolic metastatic disease in the neck, abdomen or pelvis  5. Patient underwent bronchoscopy and biopsy of hilar LN which was positive for metastatic carcinoma from the breast. She also underwent RUl biopsy and bronchial washings that were negative for malignancy.Baseline CA-15-3 normal at 16.9 And CA-27-29 normal at 15.8. Patient seen by Dr. Alinda Money at [facility] and plan was to proceed with carbo/taxol followed by pet and if she has a good response proceed to dd [facility] X4 and possible surgery and RT down the line  6.Patient has had significant neutropenia despite dose reduction of carboplatin and adding Neupogen requiring dose interruptions. Plan wastherefore to switch her to every 2 weekly cycle of carbotaxol for the remainder of cycles with on pro-Neulasta support which she completed on 07/17/17  7. PET scan after carbo/taxol showed:Marland Kitchen Interval decrease in size of the right breast mass and significant reduction in hypermetabolism. 2. Resolution of right axillary adenopathy and mediastinal and hilar adenopathy. 3. No findings for metastatic disease involving the abdomen/pelvis.  4. Diffuse osseous hypermetabolism likely due to rebound  from chemotherapy or marrow stimulating drugs.  8. Right breast US showed:Ultrasound examination of the upper outer quadrant of the right breast to reassess tumor response was completed. A dominant cyst remains in the 10 o'clock position, 10 cm from the nipple. Immediately adjacent to this at the 1030 o'clock position is the primary tumor mass now measuring 1.7 x 2.7 x 4.0 cm. This area previously measured 3.7 x 3.7 x 7.2 cm. This represents an approximately 80% decrease in volume. A single mildly prominent right axillary node measuring 0.6 x 0.7 cm with internal vascular flow is noted. BI-RADS-6.Seen by Dr. Alinda Money on 08/13/17 who recommends continuing 4 cycles of dd AC followed by PET and then possible surgery/ RT  9. Patient seen by [facility] team at [facility]. Plan is to proceed with definitive surgery- right lumpectomy and ALND followed by adjuvant RT including RT to the hilar region.  10. Patient underwentRight wire localized partial mastectomy, tissue rearrangement, axillary lymph node dissection and axillary reverse mapping, performed on 10/23/2017. Final pathology showed 16 mm residual tumor. 0/12 Ln positive for malignancy. Grade 2. Negative margins. No LVI. ER, PR and her 2 neu negative. She also completed radiation therapy to her chest wall and hilar region at in August 2019  11.  Patient started adjuvant Xeloda in August 2019   Interval history- skin over her hands and feet are dry and peeling. She has mild baseline neuropathy which is somewhat improved with ongoing acupuncture. She is off steroids for a month now. Feels fatigued and reports some exertional sob which has been ongoing for sometime now  ECOG PS- 1 Pain scale- 0 Opioid associated constipation- no  Review of systems- Review of Systems  Constitutional: Positive for malaise/fatigue. Negative for chills, fever and weight loss.  HENT: Negative for congestion, ear discharge and nosebleeds.   Eyes: Negative for blurred vision.   Respiratory: Positive for shortness of breath. Negative for cough, hemoptysis, sputum production and wheezing.   Cardiovascular: Negative for chest pain, palpitations, orthopnea and claudication.  Gastrointestinal: Negative for abdominal pain, blood in stool, constipation, diarrhea, heartburn, melena, nausea and vomiting.  Genitourinary: Negative for dysuria, flank pain, frequency, hematuria and urgency.  Musculoskeletal: Negative for back pain, joint pain and myalgias.  Skin: Negative for rash.  Neurological: Positive for sensory change (peripheral neuropathy). Negative for dizziness, tingling, focal weakness, seizures, weakness and headaches.  Endo/Heme/Allergies: Does not bruise/bleed easily.  Psychiatric/Behavioral: Negative for depression and suicidal ideas. The patient does not have insomnia.       Allergies  Allergen Reactions  . Penicillins Rash     Past Medical History:  Diagnosis Date  . Anemia   . Breast cancer ([place])   . Breast cyst, right    aspirated by Dr. Bary Castilla  . Hyperlipidemia   . Personal history of chemotherapy   . Pre-diabetes      Past Surgical History:  Procedure Laterality Date  . AXILLARY LYMPH NODE BIOPSY Right 04/09/2017   Procedure: AXILLARY LYMPH NODE BIOPSY;  Surgeon: Robert Bellow, MD;  Location: [facility] ORS;  Service: General;  Laterality: Right;  . BREAST BIOPSY Right 03/27/2017   US guided breast mass - invasive mammary carcinoma  . BREAST BIOPSY Right 03/27/2017   Lymph node - metastatic carcinoma  . BREAST BIOPSY Right 04/09/2017   Lymph node   . BREAST CYST ASPIRATION Right 11/2009   Dr. Bary Castilla did FNA  . BREAST EXCISIONAL BIOPSY Right 09/28/2017   lumpectomy  and 12 lympnode rad neo adj chemo  . COLONOSCOPY  2011  . DILATION AND CURETTAGE OF UTERUS     X3  . ENDOBRONCHIAL ULTRASOUND N/A 04/09/2017   Procedure: ENDOBRONCHIAL ULTRASOUND;  Surgeon: Laverle Hobby, MD;  Location: [facility] ORS;  Service: Pulmonary;  Laterality:  N/A;  . ENDOMETRIAL ABLATION    . ENDOMETRIAL BIOPSY  09/2009  . PORTACATH PLACEMENT Left 04/09/2017   Procedure: INSERTION PORT-A-CATH;  Surgeon: Robert Bellow, MD;  Location: [facility] ORS;  Service: General;  Laterality: Left;    Social History   Socioeconomic History  . Marital status: Married    Spouse name: Not on file  . Number of children: Not on file  . Years of education: Not on file  . Highest education level: Not on file  Occupational History  . Not on file  Social Needs  . Financial resource strain: Not on file  . Food insecurity:    Worry: Not on file    Inability: Not on file  . Transportation needs:    Medical: Not on file    Non-medical: Not on file  Tobacco Use  . Smoking status: Never Smoker  . Smokeless tobacco: Never Used  Substance and Sexual Activity  . Alcohol use: Not Currently  . Drug use: No  . Sexual activity: Not Currently  Lifestyle  . Physical activity:    Days per week: Not on file    Minutes per session: Not on file  . Stress: Not on file  Relationships  . Social connections:    Talks on phone: Not on file    Gets together: Not on file    Attends religious service: Not on file    Active member of club or organization: Not on file    Attends meetings of clubs or organizations: Not on file    Relationship status: Not on file  . Intimate partner violence:    Fear of current or ex partner: Not on file    Emotionally abused: Not on file    Physically abused: Not on file    Forced sexual activity: Not on file  Other Topics Concern  . Not on file  Social History Narrative  . Not on file    Family History  Problem Relation Age of Onset  . Melanoma Maternal Grandmother 18       currently 59  . Brain cancer Maternal Grandfather 86       unk. type; deceased in 100s  . Melanoma Other 6       mat grandmother's father  . Melanoma Father 27       on head; currently 53  . Prostate cancer Maternal Uncle        3 maternal uncles; dx in  97s  . Breast cancer Other        mat grandfather's sister; dx 26s     Current Outpatient Medications:  .  Biotin 2500 MCG CAPS, Take 5,000 mcg/day by mouth daily., Disp: , Rfl:  .  capecitabine (XELODA) 500 MG tablet, TAKE 4 TABLETS BY MOUTH  TWICE A DAY AFTER A MEAL  FOR 2 WEEKS ON, THEN 1 WEEK OFF, Disp: 112 tablet, Rfl: 0 .  Cholecalciferol (VITAMIN D3) 10000 units TABS, Take by mouth 1 day or 1 dose., Disp: , Rfl:  .  Multiple Vitamins-Minerals (CENTRUM SILVER ADULT 50+) TABS, Take 1 tablet by mouth daily., Disp: , Rfl:  .  omega-3 acid ethyl esters (LOVAZA) 1 g capsule, Take by mouth  1 day or 1 dose., Disp: , Rfl:  .  urea (CARMOL) 10 % cream, Apply topically 2 (two) times daily., Disp: 71 g, Rfl: 0 .  loratadine (CLARITIN) 10 MG tablet, Take 10 mg by mouth daily., Disp: , Rfl:  No current facility-administered medications for this visit.   Facility-Administered Medications Ordered in Other Visits:  .  Tbo-Filgrastim (GRANIX) injection 480 mcg, 480 mcg, Subcutaneous, Daily, Sindy Guadeloupe, MD, 480 mcg at 05/19/17 1412  Physical exam:  Vitals:   06/17/18 1318  BP: 112/76  Pulse: 90  Resp: 18  Temp: 97.9 F (36.6 C)  TempSrc: Tympanic  Weight: 218 lb 11.2 oz (99.2 kg)  Height: 5' 3"  (1.6 m)   Physical Exam Constitutional:      General: She is not in acute distress. HENT:     Head: Normocephalic and atraumatic.  Eyes:     Pupils: Pupils are equal, round, and reactive to light.  Neck:     Musculoskeletal: Normal range of motion.  Cardiovascular:     Rate and Rhythm: Normal rate and regular rhythm.     Heart sounds: Normal heart sounds.  Pulmonary:     Effort: Pulmonary effort is normal.     Breath sounds: Normal breath sounds.  Abdominal:     General: Bowel sounds are normal.     Palpations: Abdomen is soft.  Skin:    General: Skin is warm and dry.     Comments: Skin over b/l hands is dry with areas of exfoliation  Neurological:     Mental Status: She is alert  and oriented to person, place, and time.      CMP Latest Ref Rng & Units 02/22/2018  Glucose 70 - 99 mg/dL 86  BUN 6 - 20 mg/dL 23(H)  Creatinine 0.44 - 1.00 mg/dL 0.84  Sodium 135 - 145 mmol/L 139  Potassium 3.5 - 5.1 mmol/L 3.6  Chloride 98 - 111 mmol/L 106  CO2 22 - 32 mmol/L 26  Calcium 8.9 - 10.3 mg/dL 9.1  Total Protein 6.5 - 8.1 g/dL 6.6  Total Bilirubin 0.3 - 1.2 mg/dL 0.6  Alkaline Phos 38 - 126 U/L 112  AST 15 - 41 U/L 18  ALT 0 - 44 U/L 21   CBC Latest Ref Rng & Units 02/22/2018  WBC 3.6 - 11.0 K/uL 4.7  Hemoglobin 12.0 - 16.0 g/dL 11.1(L)  Hematocrit 35.0 - 47.0 % 33.5(L)  Platelets 150 - 440 K/uL 187      Assessment and plan- Patient is a 59 y.o. 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  This was followed by lumpectomy with axillary lymph node dissection with residual disease noted at the primary site in the right breast of 16 mm.  Negative axillary lymph nodes.  Hilum was negative on PET.  She received radiation to her hilum as well as right breast and axilla.  She is here for on treatment assessment prior to cycle 7 of adjuvant Xeloda  Labs done at [facility] on 06/13/2018 showed white count of 2.3 with an ANC of 1.3.  H&H was at baseline at 10 and platelet counts were normal at 181.  I have asked her to hold off on starting Xeloda this week.  We will repeat her labs on 06/20/2018 and if her [place] is greater than 1.5 she can restart Xeloda cycle 7 at that time 1 week  on 1 week off.  I will tentatively see her back in 5 weeks time with repeat CBC with differential and CMP prior to cycle 8 of adjuvant Xeloda which would be her last cycle.  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  Mild grade 1.  Continue to monitor.  She is  currently undergoing acupuncture  Her ongoing fatigue as well as exertional shortness of breath can also be partly because of coming off steroids and prior radiation pneumonitis.  Continue to monitor   Visit Diagnosis 1. High risk medication use   2. Malignant neoplasm of upper-outer quadrant of right breast in female, estrogen receptor negative ([place])      Dr. Randa Evens, MD, MPH [facility] [phone] 06/17/2018 1:28 PM

## 2018-06-17 NOTE — Progress Notes (Signed)
Pt doing ok and has sensitive on feet and fingers with the xeloda.

## 2018-06-21 ENCOUNTER — Telehealth: Payer: Self-pay | Admitting: *Deleted

## 2018-06-21 ENCOUNTER — Encounter: Payer: Self-pay | Admitting: Oncology

## 2018-06-21 NOTE — Telephone Encounter (Signed)
I told patient we got her lab results. WBC's are still low but Dr Janese Banks wants her to start xelada on Weds 1/22-1/28/2020. I have asked her to have her labs drawn on Monday Feb 3rd prior to starting xeloda . I told her we will call her on Feb 4th prior to her starting xeloda that week. Patient understands above.

## 2018-06-24 ENCOUNTER — Other Ambulatory Visit: Payer: Self-pay | Admitting: Oncology

## 2018-06-24 DIAGNOSIS — C50411 Malignant neoplasm of upper-outer quadrant of right female breast: Secondary | ICD-10-CM

## 2018-06-24 DIAGNOSIS — Z171 Estrogen receptor negative status [ER-]: Principal | ICD-10-CM

## 2018-07-22 ENCOUNTER — Inpatient Hospital Stay: Payer: Managed Care, Other (non HMO) | Attending: Oncology | Admitting: Oncology

## 2018-07-22 ENCOUNTER — Other Ambulatory Visit: Payer: Self-pay

## 2018-07-22 ENCOUNTER — Encounter: Payer: Self-pay | Admitting: Oncology

## 2018-07-22 VITALS — BP 121/80 | HR 79 | Temp 97.1°F | Resp 18 | Wt 208.8 lb

## 2018-07-22 DIAGNOSIS — Z9221 Personal history of antineoplastic chemotherapy: Secondary | ICD-10-CM | POA: Diagnosis not present

## 2018-07-22 DIAGNOSIS — Z171 Estrogen receptor negative status [ER-]: Secondary | ICD-10-CM | POA: Diagnosis not present

## 2018-07-22 DIAGNOSIS — T451X5D Adverse effect of antineoplastic and immunosuppressive drugs, subsequent encounter: Secondary | ICD-10-CM | POA: Diagnosis not present

## 2018-07-22 DIAGNOSIS — C50911 Malignant neoplasm of unspecified site of right female breast: Secondary | ICD-10-CM | POA: Insufficient documentation

## 2018-07-22 DIAGNOSIS — Z79899 Other long term (current) drug therapy: Secondary | ICD-10-CM | POA: Insufficient documentation

## 2018-07-22 DIAGNOSIS — C771 Secondary and unspecified malignant neoplasm of intrathoracic lymph nodes: Secondary | ICD-10-CM | POA: Diagnosis not present

## 2018-07-22 DIAGNOSIS — Z923 Personal history of irradiation: Secondary | ICD-10-CM | POA: Diagnosis not present

## 2018-07-22 DIAGNOSIS — C7800 Secondary malignant neoplasm of unspecified lung: Secondary | ICD-10-CM | POA: Diagnosis not present

## 2018-07-22 DIAGNOSIS — T451X5A Adverse effect of antineoplastic and immunosuppressive drugs, initial encounter: Secondary | ICD-10-CM

## 2018-07-22 DIAGNOSIS — D701 Agranulocytosis secondary to cancer chemotherapy: Secondary | ICD-10-CM | POA: Insufficient documentation

## 2018-07-22 NOTE — Progress Notes (Signed)
Here for fololow up no c/o per pt " feeling good "

## 2018-07-23 ENCOUNTER — Encounter: Payer: Self-pay | Admitting: Oncology

## 2018-07-23 NOTE — Progress Notes (Signed)
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 Patient is a 58 year old postmenopausal female who had a routine screening mammogram on 03/23/2017 which showed a highly suspicious palpable solid mass in the 10 o'clock position. Mass measures at least 5 cm in maximum diameter for right axillary lymph nodes have sonographic features suspicious for metastatic disease. No evidence of malignancy was noted in the left breast. This was followed by an ultrasound of the right breast including the right axilla which confirmed the same findings.Targeted ultrasound is performed, showing a markedly irregular and hypoechoic mass centered at 10 o'clock position approximately 7 cm from the nipple estimated to be at least 5.2 x 4.4 x 3.4 cm. Small areas of vascular flow are detected within the mass. At 10 o'clock position 9 cm from the nipple again noted is a simple cyst measuring approximately 2.4 cm greatest diameter. Skin thickness of the medial and lateral of right breast measures approximately 4 mm.  Ultrasound of the right axilla demonstrates 4 suspicious lymph nodes with hypoechoic thickened cortices. The largest of these lymph nodes measures 2.2 x 1.2 x 1.0 cm and has a cortical thickness of 4-5 mm.  Patient underwent core biopsy of the right breast mass.  2.  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 Right axillary lymph node biopsy was also consistent with metastatic carcinoma. ERnegativePR5% positive,and HER-2negative by FISH  3. Patient is G3P3L3. Menarche at the age of 81. Menopause at 43. No priro h/o breast biopsies. No family h/o breast cancer. She was known to have right breast cyst being followed by Dr. Bary Castilla. She noticed this amss sometime over the summer and feels this has grown since then. She is healthy and has no significant co-morbidities  4. PET/CT scan on 04/04/17 showed:IMPRESSION: 1. Hypermetabolic right breast mass with hypermetabolic lymph nodes in the right axilla and hypermetabolic metastatic disease in both hilar regions and subcarinal mediastinum. 2. No evidence for hypermetabolic metastatic disease in the neck, abdomen or pelvis  5. Patient underwent bronchoscopy and biopsy of hilar LN which was positive for metastatic carcinoma from the breast. She also underwent RUl biopsy and bronchial washings that were negative for malignancy.Baseline CA-15-3 normal at 16.9 And CA-27-29 normal at 15.8. Patient seen by Dr. Alinda Money at [facility] and plan was to proceed with carbo/taxol followed by pet and if she has a good response proceed to dd [facility] X4 and possible surgery and RT down the line  6.Patient has had significant neutropenia despite dose reduction of carboplatin and adding Neupogen requiring dose interruptions. Plan wastherefore to switch her to every 2 weekly cycle of carbotaxol for the remainder of cycles with on pro-Neulasta support which she completed on 07/17/17  7. PET scan after carbo/taxol showed:Marland Kitchen Interval decrease in size of the right breast mass and significant reduction in hypermetabolism. 2. Resolution of right axillary adenopathy and mediastinal and hilar adenopathy. 3. No findings for metastatic disease involving the abdomen/pelvis. 4. Diffuse  osseous hypermetabolism likely due to rebound  from chemotherapy or marrow stimulating drugs.  8. Right breast US showed:Ultrasound examination of the upper outer quadrant of the right breast to reassess tumor response was completed. A dominant cyst remains in the 10 o'clock position, 10 cm from the nipple. Immediately adjacent to this at the 1030 o'clock position is the primary tumor mass now measuring 1.7 x 2.7 x 4.0 cm. This area previously measured 3.7 x 3.7 x 7.2 cm. This represents an approximately 80% decrease in volume. A single mildly prominent right axillary node measuring 0.6 x 0.7 cm with internal vascular flow is noted. BI-RADS-6.Seen by Dr. Alinda Money on 08/13/17 who recommends continuing 4 cycles of dd AC followed by PET and then possible surgery/ RT  9. Patient seen by [facility] team at [facility]. Plan is to proceed with definitive surgery- right lumpectomy and ALND followed by adjuvant RT including RT to the hilar region.  10. Patient underwentRight wire localized partial mastectomy, tissue rearrangement, axillary lymph node dissection and axillary reverse mapping, performed on 10/23/2017. Final pathology showed 16 mm residual tumor. 0/12 Ln positive for malignancy. Grade 2. Negative margins. No LVI. ER, PR and her 2 neu negative. She also completed radiation therapy to her chest wall and hilar region at in August 2019  11.  Patient started adjuvant Xeloda in August 2019   Interval history-shortness of breath has improved and she is currently off steroids.  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  She has chronic mild fatigue which is unchanged.  ECOG PS- 1 Pain scale- 0   Review of systems- Review of Systems  Constitutional: Positive for malaise/fatigue. Negative for chills, fever and weight loss.  HENT: Negative for congestion, ear discharge and nosebleeds.   Eyes: Negative for blurred vision.  Respiratory: Negative for cough, hemoptysis, sputum production, shortness of breath and wheezing.   Cardiovascular: Negative for  chest pain, palpitations, orthopnea and claudication.  Gastrointestinal: Negative for abdominal pain, blood in stool, constipation, diarrhea, heartburn, melena, nausea and vomiting.  Genitourinary: Negative for dysuria, flank pain, frequency, hematuria and urgency.  Musculoskeletal: Negative for back pain, joint pain and myalgias.  Skin: Negative for rash.  Neurological: Positive for sensory change (Peripheral neuropathy). Negative for dizziness, tingling, focal weakness, seizures, weakness and headaches.  Endo/Heme/Allergies: Does not bruise/bleed easily.  Psychiatric/Behavioral: Negative for depression and suicidal ideas. The patient does not have insomnia.       Allergies  Allergen Reactions  . Penicillins Rash     Past Medical History:  Diagnosis Date  . Anemia   . Breast cancer ([place])   . Breast cyst, right    aspirated by Dr. Bary Castilla  . Hyperlipidemia   . Personal history of chemotherapy   . Pre-diabetes      Past Surgical History:  Procedure Laterality Date  . AXILLARY LYMPH NODE BIOPSY Right 04/09/2017   Procedure: AXILLARY LYMPH NODE BIOPSY;  Surgeon: Robert Bellow, MD;  Location: [facility] ORS;  Service: General;  Laterality: Right;  . BREAST BIOPSY Right 03/27/2017   US guided breast mass - invasive mammary carcinoma  . BREAST BIOPSY Right 03/27/2017   Lymph node - metastatic carcinoma  . BREAST BIOPSY Right 04/09/2017   Lymph node   . BREAST CYST ASPIRATION Right 11/2009   Dr. Bary Castilla did FNA  . BREAST EXCISIONAL BIOPSY Right 09/28/2017   lumpectomy and 12 lympnode rad neo adj chemo  . COLONOSCOPY  2011  . DILATION AND CURETTAGE OF UTERUS  X3  . ENDOBRONCHIAL ULTRASOUND N/A 04/09/2017   Procedure: ENDOBRONCHIAL ULTRASOUND;  Surgeon: Laverle Hobby, MD;  Location: [facility] ORS;  Service: Pulmonary;  Laterality: N/A;  . ENDOMETRIAL ABLATION    . ENDOMETRIAL BIOPSY  09/2009  . PORTACATH PLACEMENT Left 04/09/2017   Procedure: INSERTION PORT-A-CATH;   Surgeon: Robert Bellow, MD;  Location: [facility] ORS;  Service: General;  Laterality: Left;    Social History   Socioeconomic History  . Marital status: Married    Spouse name: Not on file  . Number of children: Not on file  . Years of education: Not on file  . Highest education level: Not on file  Occupational History  . Not on file  Social Needs  . Financial resource strain: Not on file  . Food insecurity:    Worry: Not on file    Inability: Not on file  . Transportation needs:    Medical: Not on file    Non-medical: Not on file  Tobacco Use  . Smoking status: Never Smoker  . Smokeless tobacco: Never Used  Substance and Sexual Activity  . Alcohol use: Not Currently  . Drug use: No  . Sexual activity: Not Currently  Lifestyle  . Physical activity:    Days per week: Not on file    Minutes per session: Not on file  . Stress: Not on file  Relationships  . Social connections:    Talks on phone: Not on file    Gets together: Not on file    Attends religious service: Not on file    Active member of club or organization: Not on file    Attends meetings of clubs or organizations: Not on file    Relationship status: Not on file  . Intimate partner violence:    Fear of current or ex partner: Not on file    Emotionally abused: Not on file    Physically abused: Not on file    Forced sexual activity: Not on file  Other Topics Concern  . Not on file  Social History Narrative  . Not on file    Family History  Problem Relation Age of Onset  . Melanoma Maternal Grandmother 32       currently 20  . Brain cancer Maternal Grandfather 53       unk. type; deceased in 58s  . Melanoma Other 78       mat grandmother's father  . Melanoma Father 62       on head; currently 61  . Prostate cancer Maternal Uncle        3 maternal uncles; dx in 40s  . Breast cancer Other        mat grandfather's sister; dx 76s     Current Outpatient Medications:  .  Biotin 2500 MCG CAPS, Take  5,000 mcg/day by mouth daily., Disp: , Rfl:  .  capecitabine (XELODA) 500 MG tablet, TAKE 4 TABLETS BY MOUTH  TWICE A DAY AFTER A MEAL  FOR 2 WEEKS ON, THEN 1 WEEK OFF, Disp: 112 tablet, Rfl: 0 .  Cholecalciferol (VITAMIN D3) 10000 units TABS, Take by mouth 1 day or 1 dose., Disp: , Rfl:  .  Multiple Vitamins-Minerals (CENTRUM SILVER ADULT 50+) TABS, Take 1 tablet by mouth daily., Disp: , Rfl:  .  omega-3 acid ethyl esters (LOVAZA) 1 g capsule, Take by mouth 1 day or 1 dose., Disp: , Rfl:  .  urea (CARMOL) 10 % cream, Apply topically 2 (two) times daily., Disp:  71 g, Rfl: 0 No current facility-administered medications for this visit.   Facility-Administered Medications Ordered in Other Visits:  .  Tbo-Filgrastim (GRANIX) injection 480 mcg, 480 mcg, Subcutaneous, Daily, Sindy Guadeloupe, MD, 480 mcg at 05/19/17 1412  Physical exam:  Vitals:   07/22/18 1053  BP: 121/80  Pulse: 79  Resp: 18  Temp: (!) 97.1 F (36.2 C)  TempSrc: Tympanic  Weight: 208 lb 12.8 oz (94.7 kg)   Physical Exam HENT:     Head: Normocephalic and atraumatic.  Eyes:     Pupils: Pupils are equal, round, and reactive to light.  Neck:     Musculoskeletal: Normal range of motion.  Cardiovascular:     Rate and Rhythm: Normal rate and regular rhythm.     Heart sounds: Normal heart sounds.  Pulmonary:     Effort: Pulmonary effort is normal.     Breath sounds: Normal breath sounds.  Abdominal:     General: Bowel sounds are normal.     Palpations: Abdomen is soft.  Skin:    General: Skin is warm and dry.  Neurological:     Mental Status: She is alert and oriented to person, place, and time.    Breast exam was performed in seated and lying down position. Patient is status post right lumpectomy with a well-healed surgical scar. No evidence of any palpable masses. No evidence of axillary adenopathy. No evidence of any palpable masses or lumps in the left breast. No evidence of leftt axillary adenopathy   CMP Latest  Ref Rng & Units 02/22/2018  Glucose 70 - 99 mg/dL 86  BUN 6 - 20 mg/dL 23(H)  Creatinine 0.44 - 1.00 mg/dL 0.84  Sodium 135 - 145 mmol/L 139  Potassium 3.5 - 5.1 mmol/L 3.6  Chloride 98 - 111 mmol/L 106  CO2 22 - 32 mmol/L 26  Calcium 8.9 - 10.3 mg/dL 9.1  Total Protein 6.5 - 8.1 g/dL 6.6  Total Bilirubin 0.3 - 1.2 mg/dL 0.6  Alkaline Phos 38 - 126 U/L 112  AST 15 - 41 U/L 18  ALT 0 - 44 U/L 21   CBC Latest Ref Rng & Units 02/22/2018  WBC 3.6 - 11.0 K/uL 4.7  Hemoglobin 12.0 - 16.0 g/dL 11.1(L)  Hematocrit 35.0 - 47.0 % 33.5(L)  Platelets 150 - 440 K/uL 187      Assessment and plan- Patient is a 59 y.o. 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 This was followed by lumpectomy with axillary lymph node dissection with residual disease noted at the primary site in the right breast of 16 mm. Negative axillary lymph nodes. Hilum was negative on PET.  She received radiation to her hilum as well as right breast and axilla.    She is here for on treatment assessment prior to cycle 8 of adjuvant Xeloda  Recent labs from [place] from 07/19/2018 showed white count of 2.2, H&H of 11/34.1 and a platelet count of 178.  ANC was 1.2.  LFTs were normal.  Given that her white count is still on the lower side she will start cycle 8 of adjuvant Xeloda on 07/24/2018 and take it 1 week on and one-week off.  Repeat PET CT scan about 5 weeks from now and I will see her thereafter with CBC with differential and CMP to be done at [facility] prior   Visit Diagnosis 1. 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 High risk medication use   3. Chemotherapy induced neutropenia ([facility])      Dr. Randa Evens, MD, MPH [facility] at [facility] [phone] 07/23/2018 2:24 PM

## 2018-08-05 ENCOUNTER — Other Ambulatory Visit: Payer: Self-pay | Admitting: Oncology

## 2018-08-05 DIAGNOSIS — Z171 Estrogen receptor negative status [ER-]: Principal | ICD-10-CM

## 2018-08-05 DIAGNOSIS — C50411 Malignant neoplasm of upper-outer quadrant of right female breast: Secondary | ICD-10-CM

## 2018-08-05 NOTE — Telephone Encounter (Signed)
Per VO Dr Janese Banks, need to cancel prescription as patient has completed 8 cycles of treatment I called [facility] and spoke with Sarajane Marek to cancel prescription, I was then transferred to another part of the company. I then spoke with [place] who then transferred me to another team who handles this medication.  I then spoke with Mardene Celeste who transferred me to the pharmacist Megan to cancel prescription, She states she will cancel prescription.  I then called patient to let her know prescription is cancelled and she asked who requested it as she was aware that she had completed therapy. I advised her that pharmacy had requested it.

## 2018-08-12 ENCOUNTER — Inpatient Hospital Stay: Payer: Managed Care, Other (non HMO) | Attending: Oncology

## 2018-08-12 ENCOUNTER — Other Ambulatory Visit: Payer: Self-pay

## 2018-08-12 DIAGNOSIS — Z95828 Presence of other vascular implants and grafts: Secondary | ICD-10-CM

## 2018-08-12 DIAGNOSIS — Z170 Estrogen receptor positive status [ER+]: Secondary | ICD-10-CM | POA: Insufficient documentation

## 2018-08-12 DIAGNOSIS — Z452 Encounter for adjustment and management of vascular access device: Secondary | ICD-10-CM | POA: Diagnosis present

## 2018-08-12 DIAGNOSIS — C50411 Malignant neoplasm of upper-outer quadrant of right female breast: Secondary | ICD-10-CM | POA: Diagnosis not present

## 2018-08-12 MED ORDER — SODIUM CHLORIDE 0.9% FLUSH
10.0000 mL | Freq: Once | INTRAVENOUS | Status: AC
  Administered 2018-08-12: 10 mL via INTRAVENOUS
  Filled 2018-08-12: qty 10

## 2018-08-12 MED ORDER — HEPARIN SOD (PORK) LOCK FLUSH 100 UNIT/ML IV SOLN
500.0000 [IU] | Freq: Once | INTRAVENOUS | Status: AC
  Administered 2018-08-12: 500 [IU] via INTRAVENOUS

## 2018-08-23 ENCOUNTER — Encounter: Payer: Self-pay | Admitting: Oncology

## 2018-08-23 ENCOUNTER — Inpatient Hospital Stay: Payer: Managed Care, Other (non HMO) | Admitting: Oncology

## 2018-08-26 ENCOUNTER — Inpatient Hospital Stay: Payer: Managed Care, Other (non HMO) | Admitting: Oncology

## 2018-08-29 ENCOUNTER — Ambulatory Visit: Payer: Managed Care, Other (non HMO)

## 2018-08-29 ENCOUNTER — Encounter
Admission: RE | Admit: 2018-08-29 | Discharge: 2018-08-29 | Disposition: A | Discharge status: Home / Self Care | Payer: Managed Care, Other (non HMO) | Source: Ambulatory Visit | Attending: Oncology | Admitting: Oncology

## 2018-08-29 ENCOUNTER — Other Ambulatory Visit: Payer: Self-pay

## 2018-08-29 DIAGNOSIS — C7800 Secondary malignant neoplasm of unspecified lung: Secondary | ICD-10-CM | POA: Insufficient documentation

## 2018-08-29 DIAGNOSIS — C50911 Malignant neoplasm of unspecified site of right female breast: Secondary | ICD-10-CM | POA: Diagnosis present

## 2018-08-29 LAB — GLUCOSE, CAPILLARY: GLUCOSE-CAPILLARY: 93 mg/dL (ref 70–99)

## 2018-08-29 MED ORDER — FLUDEOXYGLUCOSE F - 18 (FDG) INJECTION
10.8000 | Freq: Once | INTRAVENOUS | Status: AC | PRN
  Administered 2018-08-29: 11.02 via INTRAVENOUS

## 2018-09-04 ENCOUNTER — Other Ambulatory Visit: Payer: Self-pay

## 2018-09-05 ENCOUNTER — Other Ambulatory Visit: Payer: Self-pay

## 2018-09-05 ENCOUNTER — Inpatient Hospital Stay: Payer: Managed Care, Other (non HMO) | Attending: Oncology | Admitting: Oncology

## 2018-09-05 ENCOUNTER — Encounter: Payer: Self-pay | Admitting: Oncology

## 2018-09-05 VITALS — BP 115/78 | HR 82 | Temp 97.9°F | Ht 63.0 in | Wt 208.0 lb

## 2018-09-05 DIAGNOSIS — C7800 Secondary malignant neoplasm of unspecified lung: Secondary | ICD-10-CM | POA: Diagnosis not present

## 2018-09-05 DIAGNOSIS — D701 Agranulocytosis secondary to cancer chemotherapy: Secondary | ICD-10-CM | POA: Insufficient documentation

## 2018-09-05 DIAGNOSIS — T451X5A Adverse effect of antineoplastic and immunosuppressive drugs, initial encounter: Secondary | ICD-10-CM | POA: Insufficient documentation

## 2018-09-05 DIAGNOSIS — Z9221 Personal history of antineoplastic chemotherapy: Secondary | ICD-10-CM | POA: Diagnosis not present

## 2018-09-05 DIAGNOSIS — Z7189 Other specified counseling: Secondary | ICD-10-CM

## 2018-09-05 DIAGNOSIS — C50411 Malignant neoplasm of upper-outer quadrant of right female breast: Secondary | ICD-10-CM

## 2018-09-05 DIAGNOSIS — R748 Abnormal levels of other serum enzymes: Secondary | ICD-10-CM | POA: Insufficient documentation

## 2018-09-05 DIAGNOSIS — R942 Abnormal results of pulmonary function studies: Secondary | ICD-10-CM

## 2018-09-05 DIAGNOSIS — G620 Drug-induced polyneuropathy: Secondary | ICD-10-CM | POA: Insufficient documentation

## 2018-09-05 DIAGNOSIS — Z79899 Other long term (current) drug therapy: Secondary | ICD-10-CM | POA: Insufficient documentation

## 2018-09-05 DIAGNOSIS — Z5189 Encounter for other specified aftercare: Secondary | ICD-10-CM | POA: Insufficient documentation

## 2018-09-05 DIAGNOSIS — Z923 Personal history of irradiation: Secondary | ICD-10-CM | POA: Insufficient documentation

## 2018-09-05 DIAGNOSIS — Z171 Estrogen receptor negative status [ER-]: Secondary | ICD-10-CM | POA: Diagnosis not present

## 2018-09-05 DIAGNOSIS — C771 Secondary and unspecified malignant neoplasm of intrathoracic lymph nodes: Secondary | ICD-10-CM | POA: Insufficient documentation

## 2018-09-05 NOTE — Progress Notes (Signed)
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  Her only site of metastatic disease was biopsy-proven hilar adenopathy.  She was seen for second opinion at [facility] and underwent neoadjuvant chemotherapy with carbotaxol followed by dose dense AC.  Posttreatment scans showed excellent response to treatment with resolution of hilar adenopathy as well as axillary adenopathy but persistent primary tumor at the right breast.  She then went on to get right-sided lumpectomy along with axillary lymph node dissection. Final pathology showed 16 mm residual tumor. 0/12 Ln positive for malignancy. Grade 2. Negative margins. No LVI. ER, PR and her 2 neu negative. She also completed radiation therapy to her chest wall and hilar region at in August 2019. She then completed adjuvant xeloda.  Patient also had radiation pneumonitis and was treated with a prolonged course of steroids by radiation oncology at [facility].  Patient has had baseline foundation 1 testing done which did not show any evidence of actionable mutations.  PDL 1 was 0%.  She also had strata testing done at [facility] which did not show any actionable mutations.  Repeat PET CT scan on 08/29/2018 showed new enlarged  hypermetabolic right paratracheal and subcarinal and AP window as well as right hilar lymph nodes compatible with nodal metastatic disease.  New and enlarging hypermetabolic irregular solid pulmonary nodules consistent with pulmonary metastases.   Interval history- she feels well overall. 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 She denies any pain. Appetite is good and she denies any unintentional weight loss  ECOG PS- 1 Pain scale- 0 Opioid associated constipation- no  Review of systems- Review of Systems  Constitutional: Positive for malaise/fatigue. Negative for chills, fever and weight loss.  HENT: Negative for congestion, ear discharge and nosebleeds.   Eyes: Negative for blurred vision.  Respiratory: Negative for cough, hemoptysis, sputum production, shortness of breath and wheezing.   Cardiovascular: Negative for chest pain, palpitations, orthopnea and claudication.  Gastrointestinal: Negative for abdominal pain, blood in stool, constipation, diarrhea, heartburn, melena, nausea and vomiting.  Genitourinary: Negative for dysuria, flank pain, frequency, hematuria and urgency.  Musculoskeletal: Negative for back pain, joint pain and myalgias.  Skin: Negative for rash.  Neurological: Positive for sensory change (peripheral neuropathy). Negative for dizziness, tingling, focal weakness, seizures, weakness and headaches.  Endo/Heme/Allergies: Does not bruise/bleed easily.  Psychiatric/Behavioral: Negative for depression and suicidal ideas. The patient does not have insomnia.       Allergies  Allergen Reactions  . Penicillins Rash     Past Medical History:  Diagnosis Date  . Anemia   . Breast cancer ([place])   . Breast cyst, right    aspirated by Dr. Bary Castilla  . Hyperlipidemia   . Personal history of chemotherapy   . Pre-diabetes      Past Surgical History:  Procedure Laterality Date  . AXILLARY LYMPH NODE BIOPSY  Right 04/09/2017   Procedure: AXILLARY LYMPH  NODE BIOPSY;  Surgeon: Robert Bellow, MD;  Location: [facility] ORS;  Service: General;  Laterality: Right;  . BREAST BIOPSY Right 03/27/2017   US guided breast mass - invasive mammary carcinoma  . BREAST BIOPSY Right 03/27/2017   Lymph node - metastatic carcinoma  . BREAST BIOPSY Right 04/09/2017   Lymph node   . BREAST CYST ASPIRATION Right 11/2009   Dr. Bary Castilla did FNA  . BREAST EXCISIONAL BIOPSY Right 09/28/2017   lumpectomy and 12 lympnode rad neo adj chemo  . COLONOSCOPY  2011  . DILATION AND CURETTAGE OF UTERUS     X3  . ENDOBRONCHIAL ULTRASOUND N/A 04/09/2017   Procedure: ENDOBRONCHIAL ULTRASOUND;  Surgeon: Laverle Hobby, MD;  Location: [facility] ORS;  Service: Pulmonary;  Laterality: N/A;  . ENDOMETRIAL ABLATION    . ENDOMETRIAL BIOPSY  09/2009  . PORTACATH PLACEMENT Left 04/09/2017   Procedure: INSERTION PORT-A-CATH;  Surgeon: Robert Bellow, MD;  Location: [facility] ORS;  Service: General;  Laterality: Left;    Social History   Socioeconomic History  . Marital status: Married    Spouse name: Not on file  . Number of children: Not on file  . Years of education: Not on file  . Highest education level: Not on file  Occupational History  . Not on file  Social Needs  . Financial resource strain: Not on file  . Food insecurity:    Worry: Not on file    Inability: Not on file  . Transportation needs:    Medical: Not on file    Non-medical: Not on file  Tobacco Use  . Smoking status: Never Smoker  . Smokeless tobacco: Never Used  Substance and Sexual Activity  . Alcohol use: Not Currently  . Drug use: No  . Sexual activity: Not Currently  Lifestyle  . Physical activity:    Days per week: Not on file    Minutes per session: Not on file  . Stress: Not on file  Relationships  . Social connections:    Talks on phone: Not on file    Gets together: Not on file    Attends religious service: Not on file    Active member of club or organization: Not on file     Attends meetings of clubs or organizations: Not on file    Relationship status: Not on file  . Intimate partner violence:    Fear of current or ex partner: Not on file    Emotionally abused: Not on file    Physically abused: Not on file    Forced sexual activity: Not on file  Other Topics Concern  . Not on file  Social History Narrative  . Not on file    Family History  Problem Relation Age of Onset  . Melanoma Maternal Grandmother 27       currently 34  . Brain cancer Maternal Grandfather 61       unk. type; deceased in 51s  . Melanoma Other 71       mat grandmother's father  . Melanoma Father 88       on head; currently 45  . Prostate cancer Maternal Uncle        3 maternal uncles; dx in 12s  . Breast cancer Other        mat grandfather's sister; dx 72s     Current Outpatient Medications:  .  Biotin 2500 MCG CAPS, Take 5,000 mcg/day by mouth daily., Disp: ,  Rfl:  .  capecitabine (XELODA) 500 MG tablet, TAKE 4 TABLETS BY MOUTH  TWICE A DAY AFTER A MEAL  FOR 14 DAYS ON THEN 7 DAYS  OFF, Disp: 112 tablet, Rfl: 0 .  Cholecalciferol (VITAMIN D3) 10000 units TABS, Take by mouth 1 day or 1 dose., Disp: , Rfl:  .  Multiple Vitamins-Minerals (CENTRUM SILVER ADULT 50+) TABS, Take 1 tablet by mouth daily., Disp: , Rfl:  .  omega-3 acid ethyl esters (LOVAZA) 1 g capsule, Take by mouth 1 day or 1 dose., Disp: , Rfl:  .  urea (CARMOL) 10 % cream, Apply topically 2 (two) times daily., Disp: 71 g, Rfl: 0 No current facility-administered medications for this visit.   Facility-Administered Medications Ordered in Other Visits:  .  Tbo-Filgrastim (GRANIX) injection 480 mcg, 480 mcg, Subcutaneous, Daily, Sindy Guadeloupe, MD, 480 mcg at 05/19/17 1412  Physical exam:  Vitals:   09/05/18 1021  BP: 115/78  Pulse: 82  Temp: 97.9 F (36.6 C)  TempSrc: Tympanic  Weight: 208 lb (94.3 kg)  Height: 5' 3"  (1.6 m)   Physical Exam Constitutional:      General: She is not in acute distress.  HENT:     Head: Normocephalic and atraumatic.  Eyes:     Pupils: Pupils are equal, round, and reactive to light.  Neck:     Musculoskeletal: Normal range of motion.  Cardiovascular:     Rate and Rhythm: Normal rate and regular rhythm.     Heart sounds: Normal heart sounds.  Pulmonary:     Effort: Pulmonary effort is normal.     Breath sounds: Normal breath sounds.  Abdominal:     General: Bowel sounds are normal.     Palpations: Abdomen is soft.  Skin:    General: Skin is warm and dry.  Neurological:     Mental Status: She is alert and oriented to person, place, and time.      CMP Latest Ref Rng & Units 02/22/2018  Glucose 70 - 99 mg/dL 86  BUN 6 - 20 mg/dL 23(H)  Creatinine 0.44 - 1.00 mg/dL 0.84  Sodium 135 - 145 mmol/L 139  Potassium 3.5 - 5.1 mmol/L 3.6  Chloride 98 - 111 mmol/L 106  CO2 22 - 32 mmol/L 26  Calcium 8.9 - 10.3 mg/dL 9.1  Total Protein 6.5 - 8.1 g/dL 6.6  Total Bilirubin 0.3 - 1.2 mg/dL 0.6  Alkaline Phos 38 - 126 U/L 112  AST 15 - 41 U/L 18  ALT 0 - 44 U/L 21   CBC Latest Ref Rng & Units 02/22/2018  WBC 3.6 - 11.0 K/uL 4.7  Hemoglobin 12.0 - 16.0 g/dL 11.1(L)  Hematocrit 35.0 - 47.0 % 33.5(L)  Platelets 150 - 440 K/uL 187    No images are attached to the encounter.  Nm Pet Image Restag (ps) Skull Base To Thigh  Result Date: 08/29/2018 CLINICAL DATA:  Subsequent treatment strategy for metastatic right breast cancer with interval oral chemotherapy. Last radiation therapy August 2019. EXAM: NUCLEAR MEDICINE PET SKULL BASE TO THIGH TECHNIQUE: 11.0 mCi F-18 FDG was injected intravenously. Full-ring PET imaging was performed from the skull base to thigh after the radiotracer. CT data was obtained and used for attenuation correction and anatomic localization. Fasting blood glucose: 93 mg/dl COMPARISON:  04/05/2018 PET-CT. FINDINGS: Mediastinal blood pool activity: SUV max 3.0 NECK: No hypermetabolic lymph nodes in the neck. Incidental CT findings: none  CHEST: New enlarged hypermetabolic 1.3 cm AP window node with  max SUV 6.1 (series 3/image 80). New enlarged hypermetabolic 1.1 cm right paratracheal node with max SUV 7.2 (series 3/image 81). New enlarged hypermetabolic 1.0 cm subcarinal node with max SUV 5.5 (series 3/image 87). New hypermetabolic right hilar node with max SUV 5.1. No hypermetabolic axillary or left hilar nodes. No recurrent hypermetabolism within the right breast. Multiple new and enlarging hypermetabolic irregular solid pulmonary nodules scattered throughout the mid to lower lungs bilaterally. Representative new hypermetabolic 1.5 cm medial right middle lobe pulmonary nodule with max SUV 6.6 (series 3/image 97). Representative new hypermetabolic 1.6 cm right lower lobe pulmonary nodule with max SUV 4.5 (series 3/image 97). Previously visualized hypermetabolic 1.0 cm basilar left lower lobe pulmonary nodule with max SUV 3.4 is increased to 2.1 cm with max SUV 6.1 (series 3/image 120). The basilar left lower lobe nodularity is patchy and confluent. Incidental CT findings: Left subclavian Port-A-Cath terminates at the cavoatrial junction. ABDOMEN/PELVIS: No abnormal hypermetabolic activity within the liver, pancreas, adrenal glands, or spleen. No hypermetabolic lymph nodes in the abdomen or pelvis. Incidental CT findings: Atherosclerotic nonaneurysmal abdominal aorta. SKELETON: No focal hypermetabolic activity to suggest skeletal metastasis. Incidental CT findings: none IMPRESSION: 1. New enlarged hypermetabolic right paratracheal, subcarinal, AP window and right hilar lymph nodes compatible with nodal metastatic disease. 2. New and enlarging hypermetabolic irregular solid pulmonary nodules scattered throughout the mid to lower lungs bilaterally, compatible with pulmonary metastatic disease. 3. No hypermetabolic metastatic disease in the neck, abdomen, pelvis or osseous structures. 4.  Aortic Atherosclerosis (ICD10-I70.0). Electronically Signed    By: Ilona Sorrel M.D.   On: 08/29/2018 15:13     Assessment and plan- Patient is a 58 y.o. female Stage IV triple negative invasive breast carcinoma of the right breast T3N1M1 with metastases to hilum and subcarinal nodes.  She recently completed 8 cycles of adjuvant Xeloda and is here to discuss the results of her PET CT scan  I have reviewed PET/CT scan images independently and discussed findings with patient.  Unfortunately patient has evidence of disease progression in her lungs as well as hilar and mediastinal adenopathy despite receiving aggressive neoadjuvant chemotherapy followed by surgery and radiation with a curative intent.  The lung nodules as well as lymph nodes are significantly hypermetabolic and over 1 cm consistent with malignancy.  Given her quick progression I do not see a role for another bronchoscopy or lung biopsy at this time.    Foundation 1 and strata testing also did not reveal any evidence of actionable mutations.  PDL 1 extension was 0% and therefore she would also not be a candidate for immunotherapy at this time.  I discussed fourth line single agent chemotherapy with eribulin given that her neuropathy has improved after stopping Xeloda.  Discussed risks and benefits of eribulin including all but not limited to nausea, vomiting, low blood counts, risk of peripheral neuropathy infections and hospitalizations.  Also reviewed additional risk of morbidity and mortality associated with COVID while she is undergoing chemotherapy.  Treatment will be given with a palliative intent.  I will plan to give her eribulin 1.4 mg/m on day 1 and day 8 every 21 days.  She does have some baseline mild neutropenia and depending on her blood counts I may have to give her 1 week on 1 week off if counts do not permit.  I may also consider adding on pro-Neulasta based on her blood counts.  I will tentatively plan to start treatment next week.  Treatment will be given until progression or  toxicity    Total face to face encounter time for this patient visit was 40 min. >50% of the time was  spent in counseling and coordination of care.     Visit Diagnosis 1. Goals of care, counseling/discussion   2. Malignant neoplasm of upper-outer quadrant of right breast in female, estrogen receptor negative ([place])   3. Abnormal PET scan, lung      Dr. Randa Evens, MD, MPH [facility] at [facility] [phone] 09/07/2018 9:29 AM

## 2018-09-07 NOTE — Progress Notes (Signed)
START ON PATHWAY REGIMEN - Breast     A cycle is every 21 days:     Eribulin mesylate   **Always confirm dose/schedule in your pharmacy ordering system**  Patient Characteristics: Distant Metastases or Locoregional Recurrent Disease - Unresected or Locally Advanced Unresectable Disease Progressing after Neoadjuvant and Local Therapies, HER2 Negative/Unknown/Equivocal, ER Negative/Unknown, Chemotherapy, Third Line and Beyond,  Eribulin Candidate Therapeutic Status: Distant Metastases BRCA Mutation Status: Absent ER Status: Negative (-) HER2 Status: Negative (-) PR Status: Negative (-) Line of Therapy: Third Line and Beyond Intent of Therapy: Non-Curative / Palliative Intent, Discussed with Patient 

## 2018-09-11 ENCOUNTER — Encounter: Payer: Self-pay | Admitting: Oncology

## 2018-09-12 ENCOUNTER — Other Ambulatory Visit: Payer: Self-pay

## 2018-09-12 ENCOUNTER — Inpatient Hospital Stay (HOSPITAL_BASED_OUTPATIENT_CLINIC_OR_DEPARTMENT_OTHER): Payer: Managed Care, Other (non HMO) | Admitting: Oncology

## 2018-09-12 ENCOUNTER — Inpatient Hospital Stay: Payer: Managed Care, Other (non HMO)

## 2018-09-12 ENCOUNTER — Encounter: Payer: Self-pay | Admitting: Oncology

## 2018-09-12 VITALS — BP 102/68 | HR 57 | Temp 98.4°F | Resp 18 | Ht 63.0 in | Wt 205.9 lb

## 2018-09-12 DIAGNOSIS — T451X5A Adverse effect of antineoplastic and immunosuppressive drugs, initial encounter: Secondary | ICD-10-CM

## 2018-09-12 DIAGNOSIS — C50411 Malignant neoplasm of upper-outer quadrant of right female breast: Secondary | ICD-10-CM | POA: Diagnosis not present

## 2018-09-12 DIAGNOSIS — Z5189 Encounter for other specified aftercare: Secondary | ICD-10-CM | POA: Diagnosis not present

## 2018-09-12 DIAGNOSIS — C7800 Secondary malignant neoplasm of unspecified lung: Secondary | ICD-10-CM

## 2018-09-12 DIAGNOSIS — Z79899 Other long term (current) drug therapy: Secondary | ICD-10-CM

## 2018-09-12 DIAGNOSIS — C50911 Malignant neoplasm of unspecified site of right female breast: Secondary | ICD-10-CM

## 2018-09-12 DIAGNOSIS — Z171 Estrogen receptor negative status [ER-]: Secondary | ICD-10-CM

## 2018-09-12 DIAGNOSIS — Z923 Personal history of irradiation: Secondary | ICD-10-CM

## 2018-09-12 DIAGNOSIS — C771 Secondary and unspecified malignant neoplasm of intrathoracic lymph nodes: Secondary | ICD-10-CM | POA: Diagnosis not present

## 2018-09-12 DIAGNOSIS — Z5111 Encounter for antineoplastic chemotherapy: Secondary | ICD-10-CM

## 2018-09-12 DIAGNOSIS — Z9221 Personal history of antineoplastic chemotherapy: Secondary | ICD-10-CM

## 2018-09-12 DIAGNOSIS — G620 Drug-induced polyneuropathy: Secondary | ICD-10-CM

## 2018-09-12 DIAGNOSIS — D701 Agranulocytosis secondary to cancer chemotherapy: Secondary | ICD-10-CM

## 2018-09-12 MED ORDER — SODIUM CHLORIDE 0.9% FLUSH
10.0000 mL | INTRAVENOUS | Status: DC | PRN
  Administered 2018-09-12: 10 mL
  Filled 2018-09-12: qty 10

## 2018-09-12 MED ORDER — SODIUM CHLORIDE 0.9 % IV SOLN
1.4000 mg/m2 | Freq: Once | INTRAVENOUS | Status: AC
  Administered 2018-09-12: 2.85 mg via INTRAVENOUS
  Filled 2018-09-12: qty 5.7

## 2018-09-12 MED ORDER — PROCHLORPERAZINE MALEATE 5 MG PO TABS
10.0000 mg | ORAL_TABLET | Freq: Once | ORAL | Status: AC
  Administered 2018-09-12: 10:00:00 10 mg via ORAL
  Filled 2018-09-12: qty 2

## 2018-09-12 MED ORDER — SODIUM CHLORIDE 0.9 % IV SOLN
Freq: Once | INTRAVENOUS | Status: AC
  Administered 2018-09-12: 10:00:00 via INTRAVENOUS
  Filled 2018-09-12: qty 250

## 2018-09-12 MED ORDER — HEPARIN SOD (PORK) LOCK FLUSH 100 UNIT/ML IV SOLN
500.0000 [IU] | Freq: Once | INTRAVENOUS | Status: AC | PRN
  Administered 2018-09-12: 500 [IU]
  Filled 2018-09-12: qty 5

## 2018-09-12 NOTE — Progress Notes (Signed)
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  Her only site of metastatic disease was biopsy-proven hilar adenopathy.  She was seen for second opinion at [facility] and underwent neoadjuvant chemotherapy with carbotaxol followed by dose dense AC.  Posttreatment scans showed excellent response to treatment with resolution of hilar adenopathy as well as axillary adenopathy but persistent primary tumor at the right breast.  She then went on to get right-sided lumpectomy along with axillary lymph node dissection. Final pathology showed 16 mm residual tumor. 0/12 Ln positive for malignancy. Grade 2. Negative margins. No LVI. ER, PR and her 2 neu negative. She also completed radiation therapy to her chest wall and hilar region at in August 2019. She then completed adjuvant xeloda.  Patient also had radiation pneumonitis and was treated with a prolonged course of steroids by radiation oncology at [facility].  Patient has had baseline foundation 1 testing done which did not show any evidence of actionable mutations.  PDL 1 was 0%.  She also had strata testing done at [facility] which did not show any actionable mutations.  Repeat PET CT scan on 08/29/2018 showed new enlarged  hypermetabolic right paratracheal and subcarinal and AP window as well as right hilar lymph nodes compatible with nodal metastatic disease.  New and enlarging hypermetabolic irregular solid pulmonary nodules consistent with pulmonary metastases.  Plan is to proceed with fourth line eribulin   Interval history-she feels well and denies any specific complaints at this time.  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  HENT: Negative for congestion, ear discharge and nosebleeds.   Eyes: Negative for blurred vision.  Respiratory: Negative for cough, hemoptysis, sputum production, shortness of breath and wheezing.   Cardiovascular: Negative for chest pain, palpitations, orthopnea and claudication.  Gastrointestinal: Negative for abdominal pain, blood in stool, constipation, diarrhea, heartburn, melena, nausea and vomiting.  Genitourinary: Negative for dysuria, flank pain, frequency, hematuria and urgency.  Musculoskeletal: Negative for back pain, joint pain and myalgias.  Skin: Negative for rash.  Neurological: Negative for dizziness, tingling, focal weakness, seizures, weakness and headaches.  Endo/Heme/Allergies: Does not bruise/bleed easily.  Psychiatric/Behavioral: Negative for depression and suicidal ideas. The patient does not have insomnia.       Allergies  Allergen Reactions  . Penicillins Rash     Past Medical History:  Diagnosis Date  . Anemia   . Breast cancer ([place])   . Breast cyst, right    aspirated by Dr. Bary Castilla  . Hyperlipidemia   . Personal history of chemotherapy   . Pre-diabetes      Past Surgical History:  Procedure Laterality Date  . AXILLARY LYMPH NODE BIOPSY Right 04/09/2017   Procedure: AXILLARY  LYMPH NODE BIOPSY;  Surgeon: Robert Bellow, MD;  Location: [facility] ORS;  Service: General;   Laterality: Right;  . BREAST BIOPSY Right 03/27/2017   US guided breast mass - invasive mammary carcinoma  . BREAST BIOPSY Right 03/27/2017   Lymph node - metastatic carcinoma  . BREAST BIOPSY Right 04/09/2017   Lymph node   . BREAST CYST ASPIRATION Right 11/2009   Dr. Bary Castilla did FNA  . BREAST EXCISIONAL BIOPSY Right 09/28/2017   lumpectomy and 12 lympnode rad neo adj chemo  . COLONOSCOPY  2011  . DILATION AND CURETTAGE OF UTERUS     X3  . ENDOBRONCHIAL ULTRASOUND N/A 04/09/2017   Procedure: ENDOBRONCHIAL ULTRASOUND;  Surgeon: Laverle Hobby, MD;  Location: [facility] ORS;  Service: Pulmonary;  Laterality: N/A;  . ENDOMETRIAL ABLATION    . ENDOMETRIAL BIOPSY  09/2009  . PORTACATH PLACEMENT Left 04/09/2017   Procedure: INSERTION PORT-A-CATH;  Surgeon: Robert Bellow, MD;  Location: [facility] ORS;  Service: General;  Laterality: Left;    Social History   Socioeconomic History  . Marital status: Married    Spouse name: Not on file  . Number of children: Not on file  . Years of education: Not on file  . Highest education level: Not on file  Occupational History  . Not on file  Social Needs  . Financial resource strain: Not on file  . Food insecurity:    Worry: Not on file    Inability: Not on file  . Transportation needs:    Medical: Not on file    Non-medical: Not on file  Tobacco Use  . Smoking status: Never Smoker  . Smokeless tobacco: Never Used  Substance and Sexual Activity  . Alcohol use: Not Currently  . Drug use: No  . Sexual activity: Not Currently  Lifestyle  . Physical activity:    Days per week: Not on file    Minutes per session: Not on file  . Stress: Not on file  Relationships  . Social connections:    Talks on phone: Not on file    Gets together: Not on file    Attends religious service: Not on file    Active member of club or organization: Not on file    Attends meetings of clubs or organizations: Not on file    Relationship status: Not on file   . Intimate partner violence:    Fear of current or ex partner: Not on file    Emotionally abused: Not on file    Physically abused: Not on file    Forced sexual activity: Not on file  Other Topics Concern  . Not on file  Social History Narrative  . Not on file    Family History  Problem Relation Age of Onset  . Melanoma Maternal Grandmother 74       currently 40  . Brain cancer Maternal Grandfather 40       unk. type; deceased in 8s  . Melanoma Other 13       mat grandmother's father  . Melanoma Father 25       on head; currently 25  . Prostate cancer Maternal Uncle        3 maternal uncles; dx in 75s  . Breast cancer Other        mat grandfather's sister; dx 56s     Current Outpatient Medications:  .  Biotin 2500 MCG CAPS, Take 5,000 mcg/day by mouth daily., Disp: , Rfl:  .  capecitabine (  XELODA) 500 MG tablet, TAKE 4 TABLETS BY MOUTH  TWICE A DAY AFTER A MEAL  FOR 14 DAYS ON THEN 7 DAYS  OFF, Disp: 112 tablet, Rfl: 0 .  Cholecalciferol (VITAMIN D3) 10000 units TABS, Take by mouth 1 day or 1 dose., Disp: , Rfl:  .  Multiple Vitamins-Minerals (CENTRUM SILVER ADULT 50+) TABS, Take 1 tablet by mouth daily., Disp: , Rfl:  .  omega-3 acid ethyl esters (LOVAZA) 1 g capsule, Take by mouth 1 day or 1 dose., Disp: , Rfl:  .  urea (CARMOL) 10 % cream, Apply topically 2 (two) times daily., Disp: 71 g, Rfl: 0 No current facility-administered medications for this visit.   Facility-Administered Medications Ordered in Other Visits:  .  Tbo-Filgrastim (GRANIX) injection 480 mcg, 480 mcg, Subcutaneous, Daily, Sindy Guadeloupe, MD, 480 mcg at 05/19/17 1412  Physical exam:  Vitals:   09/12/18 0907  BP: 102/68  Pulse: (!) 57  Resp: 18  Temp: 98.4 F (36.9 C)  TempSrc: Tympanic  Weight: 205 lb 14.4 oz (93.4 kg)  Height: 5' 3"  (1.6 m)   Physical Exam HENT:     Head: Normocephalic and atraumatic.  Eyes:     Pupils: Pupils are equal, round, and reactive to light.  Neck:      Musculoskeletal: Normal range of motion.  Cardiovascular:     Rate and Rhythm: Normal rate and regular rhythm.     Heart sounds: Normal heart sounds.  Pulmonary:     Effort: Pulmonary effort is normal.     Breath sounds: Normal breath sounds.  Abdominal:     General: Bowel sounds are normal.     Palpations: Abdomen is soft.  Skin:    General: Skin is warm and dry.  Neurological:     Mental Status: She is alert and oriented to person, place, and time.      CMP Latest Ref Rng & Units 02/22/2018  Glucose 70 - 99 mg/dL 86  BUN 6 - 20 mg/dL 23(H)  Creatinine 0.44 - 1.00 mg/dL 0.84  Sodium 135 - 145 mmol/L 139  Potassium 3.5 - 5.1 mmol/L 3.6  Chloride 98 - 111 mmol/L 106  CO2 22 - 32 mmol/L 26  Calcium 8.9 - 10.3 mg/dL 9.1  Total Protein 6.5 - 8.1 g/dL 6.6  Total Bilirubin 0.3 - 1.2 mg/dL 0.6  Alkaline Phos 38 - 126 U/L 112  AST 15 - 41 U/L 18  ALT 0 - 44 U/L 21   CBC Latest Ref Rng & Units 02/22/2018  WBC 3.6 - 11.0 K/uL 4.7  Hemoglobin 12.0 - 16.0 g/dL 11.1(L)  Hematocrit 35.0 - 47.0 % 33.5(L)  Platelets 150 - 440 K/uL 187    No images are attached to the encounter.  Nm Pet Image Restag (ps) Skull Base To Thigh  Result Date: 08/29/2018 CLINICAL DATA:  Subsequent treatment strategy for metastatic right breast cancer with interval oral chemotherapy. Last radiation therapy August 2019. EXAM: NUCLEAR MEDICINE PET SKULL BASE TO THIGH TECHNIQUE: 11.0 mCi F-18 FDG was injected intravenously. Full-ring PET imaging was performed from the skull base to thigh after the radiotracer. CT data was obtained and used for attenuation correction and anatomic localization. Fasting blood glucose: 93 mg/dl COMPARISON:  04/05/2018 PET-CT. FINDINGS: Mediastinal blood pool activity: SUV max 3.0 NECK: No hypermetabolic lymph nodes in the neck. Incidental CT findings: none CHEST: New enlarged hypermetabolic 1.3 cm AP window node with max SUV 6.1 (series 3/image 80). New enlarged hypermetabolic 1.1 cm right  paratracheal node with max SUV 7.2 (series 3/image 81). New enlarged hypermetabolic 1.0 cm subcarinal node with max SUV 5.5 (series 3/image 87). New hypermetabolic right hilar node with max SUV 5.1. No hypermetabolic axillary or left hilar nodes. No recurrent hypermetabolism within the right breast. Multiple new and enlarging hypermetabolic irregular solid pulmonary nodules scattered throughout the mid to lower lungs bilaterally. Representative new hypermetabolic 1.5 cm medial right middle lobe pulmonary nodule with max SUV 6.6 (series 3/image 97). Representative new hypermetabolic 1.6 cm right lower lobe pulmonary nodule with max SUV 4.5 (series 3/image 97). Previously visualized hypermetabolic 1.0 cm basilar left lower lobe pulmonary nodule with max SUV 3.4 is increased to 2.1 cm with max SUV 6.1 (series 3/image 120). The basilar left lower lobe nodularity is patchy and confluent. Incidental CT findings: Left subclavian Port-A-Cath terminates at the cavoatrial junction. ABDOMEN/PELVIS: No abnormal hypermetabolic activity within the liver, pancreas, adrenal glands, or spleen. No hypermetabolic lymph nodes in the abdomen or pelvis. Incidental CT findings: Atherosclerotic nonaneurysmal abdominal aorta. SKELETON: No focal hypermetabolic activity to suggest skeletal metastasis. Incidental CT findings: none IMPRESSION: 1. New enlarged hypermetabolic right paratracheal, subcarinal, AP window and right hilar lymph nodes compatible with nodal metastatic disease. 2. New and enlarging hypermetabolic irregular solid pulmonary nodules scattered throughout the mid to lower lungs bilaterally, compatible with pulmonary metastatic disease. 3. No hypermetabolic metastatic disease in the neck, abdomen, pelvis or osseous structures. 4.  Aortic Atherosclerosis (ICD10-I70.0). Electronically Signed   By: Ilona Sorrel M.D.   On: 08/29/2018 15:13     Assessment and plan- Patient is a 58 y.o. female  Stage IV triple negative invasive  breast carcinoma of the right breast T3N1M1 with metastases to hilum and subcarinal nodes.   She completed 8 cycles of adjuvant Xeloda and scan showed progression of pulmonary and hilar metastases.  She is here for on treatment assessment prior to cycle 1 of eribulin  Labs done at [facility] on 09/10/2018 which showed white count of 3.2 with an ANC of 1.9, H&H of 12.2/36.4 and a platelet count of 180.  LFTs were within normal limits.  She will therefore proceed with eribulin at 1.4 mg/m today.  She will directly proceed for eribulin next week on 09/18/2018.  She will receive on pro-Neulasta on cycle 1 day 8.  Patient does have baseline leukopenia and mild neutropenia from her prior chemotherapy which we will continue to monitor closely  I will see her back on 10/03/2018 with CBC with differential, CMP for cycle 2-day 1 of eribulin.  Treatment is being given with the palliative intent    Visit Diagnosis 1. Encounter for antineoplastic chemotherapy   2. 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 Chemotherapy induced neutropenia ([facility])      Dr. Randa Evens, MD, MPH [facility] [phone] 09/12/2018 9:34 AM

## 2018-09-12 NOTE — Progress Notes (Signed)
No concerns-start new treatment today

## 2018-09-18 ENCOUNTER — Encounter: Payer: Self-pay | Admitting: Oncology

## 2018-09-19 ENCOUNTER — Inpatient Hospital Stay: Payer: Managed Care, Other (non HMO)

## 2018-09-19 ENCOUNTER — Ambulatory Visit: Payer: Managed Care, Other (non HMO) | Admitting: Oncology

## 2018-09-25 ENCOUNTER — Telehealth: Payer: Self-pay | Admitting: *Deleted

## 2018-09-25 ENCOUNTER — Encounter: Payer: Self-pay | Admitting: Oncology

## 2018-09-25 NOTE — Telephone Encounter (Signed)
Called patient today to let her know that her Liberty is 0.2.  She will be unable to get her chemotherapy scheduled for tomorrow due to this.  She has a [facility] form and will have her labs rechecked next week.  She is already on the schedule for April 30th for an infusion.  He can have her labs drawn the day ahead of time.  Also Dr. Janese Banks said with her dropping this low she will need to go back on Neulasta like she had done before when she was on chemo previously.  Also in order to do that her treatment will be take 1 week on and then 1 week off of treatment for each cycle now.  Patient understands and is agreeable with the plan.  I also told her that if she runs a fever or if she gets chills or starts feeling low just give [place] a call.  He is agreeable to this

## 2018-09-26 ENCOUNTER — Inpatient Hospital Stay: Payer: Managed Care, Other (non HMO)

## 2018-09-26 ENCOUNTER — Ambulatory Visit: Payer: Managed Care, Other (non HMO)

## 2018-09-26 ENCOUNTER — Inpatient Hospital Stay: Payer: Managed Care, Other (non HMO) | Admitting: Oncology

## 2018-10-02 ENCOUNTER — Other Ambulatory Visit: Payer: Self-pay | Admitting: Oncology

## 2018-10-02 ENCOUNTER — Encounter: Payer: Self-pay | Admitting: Oncology

## 2018-10-03 ENCOUNTER — Inpatient Hospital Stay: Payer: Managed Care, Other (non HMO)

## 2018-10-03 ENCOUNTER — Encounter: Payer: Self-pay | Admitting: Oncology

## 2018-10-03 ENCOUNTER — Other Ambulatory Visit: Payer: Self-pay

## 2018-10-03 ENCOUNTER — Encounter: Payer: Self-pay | Admitting: *Deleted

## 2018-10-03 ENCOUNTER — Inpatient Hospital Stay (HOSPITAL_BASED_OUTPATIENT_CLINIC_OR_DEPARTMENT_OTHER): Payer: Managed Care, Other (non HMO) | Admitting: Oncology

## 2018-10-03 ENCOUNTER — Inpatient Hospital Stay: Payer: Managed Care, Other (non HMO) | Admitting: Oncology

## 2018-10-03 VITALS — BP 104/62 | HR 102 | Temp 96.4°F | Resp 18 | Ht 63.0 in | Wt 206.3 lb

## 2018-10-03 DIAGNOSIS — C50411 Malignant neoplasm of upper-outer quadrant of right female breast: Secondary | ICD-10-CM

## 2018-10-03 DIAGNOSIS — Z171 Estrogen receptor negative status [ER-]: Principal | ICD-10-CM

## 2018-10-03 DIAGNOSIS — Z79899 Other long term (current) drug therapy: Secondary | ICD-10-CM

## 2018-10-03 DIAGNOSIS — C7800 Secondary malignant neoplasm of unspecified lung: Secondary | ICD-10-CM | POA: Diagnosis not present

## 2018-10-03 DIAGNOSIS — G620 Drug-induced polyneuropathy: Secondary | ICD-10-CM

## 2018-10-03 DIAGNOSIS — C771 Secondary and unspecified malignant neoplasm of intrathoracic lymph nodes: Secondary | ICD-10-CM

## 2018-10-03 DIAGNOSIS — D701 Agranulocytosis secondary to cancer chemotherapy: Secondary | ICD-10-CM

## 2018-10-03 DIAGNOSIS — Z5189 Encounter for other specified aftercare: Secondary | ICD-10-CM | POA: Diagnosis not present

## 2018-10-03 DIAGNOSIS — Z923 Personal history of irradiation: Secondary | ICD-10-CM

## 2018-10-03 DIAGNOSIS — Z5111 Encounter for antineoplastic chemotherapy: Secondary | ICD-10-CM

## 2018-10-03 DIAGNOSIS — C50911 Malignant neoplasm of unspecified site of right female breast: Secondary | ICD-10-CM

## 2018-10-03 DIAGNOSIS — R748 Abnormal levels of other serum enzymes: Secondary | ICD-10-CM

## 2018-10-03 DIAGNOSIS — Z9221 Personal history of antineoplastic chemotherapy: Secondary | ICD-10-CM

## 2018-10-03 DIAGNOSIS — T451X5A Adverse effect of antineoplastic and immunosuppressive drugs, initial encounter: Secondary | ICD-10-CM

## 2018-10-03 MED ORDER — PROCHLORPERAZINE MALEATE 10 MG PO TABS
10.0000 mg | ORAL_TABLET | Freq: Once | ORAL | Status: AC
  Administered 2018-10-03: 13:00:00 10 mg via ORAL
  Filled 2018-10-03: qty 1

## 2018-10-03 MED ORDER — PEGFILGRASTIM 6 MG/0.6ML ~~LOC~~ PSKT
6.0000 mg | PREFILLED_SYRINGE | Freq: Once | SUBCUTANEOUS | Status: AC
  Administered 2018-10-03: 13:00:00 6 mg via SUBCUTANEOUS
  Filled 2018-10-03: qty 0.6

## 2018-10-03 MED ORDER — HEPARIN SOD (PORK) LOCK FLUSH 100 UNIT/ML IV SOLN
500.0000 [IU] | Freq: Once | INTRAVENOUS | Status: AC | PRN
  Administered 2018-10-03: 500 [IU]
  Filled 2018-10-03: qty 5

## 2018-10-03 MED ORDER — SODIUM CHLORIDE 0.9 % IV SOLN
1.0500 mg/m2 | Freq: Once | INTRAVENOUS | Status: AC
  Administered 2018-10-03: 13:00:00 2.15 mg via INTRAVENOUS
  Filled 2018-10-03: qty 4.3

## 2018-10-03 MED ORDER — SODIUM CHLORIDE 0.9 % IV SOLN
Freq: Once | INTRAVENOUS | Status: AC
  Administered 2018-10-03: 13:00:00 via INTRAVENOUS
  Filled 2018-10-03: qty 250

## 2018-10-03 NOTE — Progress Notes (Signed)
Pt doing ok, hair fell out.

## 2018-10-03 NOTE — Progress Notes (Signed)
alkaline Phosphate 125, HR 102. Per Judeen Hammans RN per Dr. Janese Banks okay to proceed with treatment.

## 2018-10-04 NOTE — Progress Notes (Signed)
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 Her only site of metastatic disease was biopsy-proven hilar adenopathy. She was seen for second opinion at [facility] and underwent neoadjuvant chemotherapy with carbotaxol followed by dose dense AC. Posttreatment scans showed excellent response to treatment with resolution of hilar adenopathy as well as axillary adenopathy but persistent primary tumor at the right breast. She then went on to get right-sided lumpectomy along with axillary lymph node dissection. Final pathology showed 16 mm residual tumor. 0/12 Ln positive for malignancy. Grade 2. Negative margins. No LVI. ER, PR and her 2 neu negative. She also completed radiation therapy to her chest wall and hilar region at in August 2019. She then completed adjuvant xeloda.Patient also had radiation pneumonitis and was treated with a prolonged course of steroids by radiation oncology at [facility].  Patient has had baseline foundation 1 testing done which did not show any evidence of actionable mutations. PDL 1 was 0%. She also had strata testing done at [facility] which did not show any actionable mutations.  Repeat PET CT scan on 08/29/2018 showed new  enlarged hypermetabolic right paratracheal and subcarinal and AP window as well as right hilar lymph nodes compatible with nodal metastatic disease. New and enlarging hypermetabolic irregular solid pulmonary nodules consistent with pulmonary metastases.  Plan is to proceed with fourth line eribulin    Interval history-patient reports feeling well and denies any specific complaints at this time.  Her neuropathy continues to be mild and stable and she is not currently taking any medications for this  ECOG PS- 1 Pain scale- 0 Opioid associated constipation- no  Review of systems- Review of Systems  Constitutional: Positive for malaise/fatigue. Negative for chills, fever and weight loss.  HENT: Negative for congestion, ear discharge and nosebleeds.   Eyes: Negative for blurred vision.  Respiratory: Negative for cough, hemoptysis, sputum production, shortness of breath and wheezing.   Cardiovascular: Negative for chest pain, palpitations, orthopnea and claudication.  Gastrointestinal: Negative for abdominal pain, blood in stool, constipation, diarrhea, heartburn, melena, nausea and vomiting.  Genitourinary: Negative for dysuria, flank pain, frequency, hematuria and urgency.  Musculoskeletal: Negative for back pain, joint pain and myalgias.  Skin: Negative for rash.  Neurological: Positive for sensory change (Peripheral neuropathy). Negative for dizziness, tingling, focal weakness, seizures, weakness and headaches.  Endo/Heme/Allergies: Does not bruise/bleed easily.  Psychiatric/Behavioral: Negative for depression and suicidal ideas. The patient does not have insomnia.        Allergies  Allergen Reactions  . Penicillins Rash     Past Medical History:  Diagnosis Date  . Anemia   . Breast cancer ([place])   . Breast cyst, right    aspirated by Dr. Bary Castilla  . Hyperlipidemia   . Personal history of chemotherapy   . Pre-diabetes      Past Surgical History:  Procedure Laterality Date   . AXILLARY LYMPH NODE BIOPSY Right 04/09/2017   Procedure: AXILLARY LYMPH NODE BIOPSY;  Surgeon: Robert Bellow, MD;  Location: [facility] ORS;  Service: General;  Laterality: Right;  . BREAST BIOPSY Right 03/27/2017   US guided breast mass - invasive mammary carcinoma  . BREAST BIOPSY Right 03/27/2017   Lymph node - metastatic carcinoma  . BREAST BIOPSY Right 04/09/2017   Lymph node   . BREAST CYST ASPIRATION Right 11/2009   Dr. Bary Castilla did FNA  . BREAST EXCISIONAL BIOPSY Right 09/28/2017   lumpectomy and 12 lympnode rad neo adj chemo  . COLONOSCOPY  2011  . DILATION AND CURETTAGE OF UTERUS     X3  . ENDOBRONCHIAL ULTRASOUND N/A 04/09/2017   Procedure: ENDOBRONCHIAL ULTRASOUND;  Surgeon: Laverle Hobby, MD;  Location: [facility] ORS;  Service: Pulmonary;  Laterality: N/A;  . ENDOMETRIAL ABLATION    . ENDOMETRIAL BIOPSY  09/2009  . PORTACATH PLACEMENT Left 04/09/2017   Procedure: INSERTION PORT-A-CATH;  Surgeon: Robert Bellow, MD;  Location: [facility] ORS;  Service: General;  Laterality: Left;    Social History   Socioeconomic History  . Marital status: Married    Spouse name: Not on file  . Number of children: Not on file  . Years of education: Not on file  . Highest education level: Not on file  Occupational History  . Not on file  Social Needs  . Financial resource strain: Not on file  . Food insecurity:    Worry: Not on file    Inability: Not on file  . Transportation needs:    Medical: Not on file    Non-medical: Not on file  Tobacco Use  . Smoking status: Never Smoker  . Smokeless tobacco: Never Used  Substance and Sexual Activity  . Alcohol use: Not Currently  . Drug use: No  . Sexual activity: Not Currently  Lifestyle  . Physical activity:    Days per week: Not on file    Minutes per session: Not on file  . Stress: Not on file  Relationships  . Social connections:    Talks on phone: Not on file    Gets together: Not on file    Attends religious service:  Not on file    Active member of club or organization: Not on file    Attends meetings of clubs or organizations: Not on file    Relationship status: Not on file  . Intimate partner violence:    Fear of current or ex partner: Not on file    Emotionally abused: Not on file    Physically abused: Not on file    Forced sexual activity: Not on file  Other Topics Concern  . Not on file  Social History Narrative  . Not on file    Family History  Problem Relation Age of Onset  . Melanoma Maternal Grandmother 71       currently 14  . Brain cancer Maternal Grandfather 45       unk. type; deceased in 41s  . Melanoma Other 78       mat grandmother's father  . Melanoma Father 36       on head; currently 34  . Prostate cancer Maternal Uncle        3 maternal uncles; dx in 62s  . Breast cancer Other        mat grandfather's sister; dx 74s     Current Outpatient Medications:  .  Biotin 2500  MCG CAPS, Take 5,000 mcg/day by mouth daily., Disp: , Rfl:  .  Cholecalciferol (VITAMIN D3) 10000 units TABS, Take by mouth 1 day or 1 dose., Disp: , Rfl:  .  Multiple Vitamins-Minerals (CENTRUM SILVER ADULT 50+) TABS, Take 1 tablet by mouth daily., Disp: , Rfl:  .  omega-3 acid ethyl esters (LOVAZA) 1 g capsule, Take by mouth 1 day or 1 dose., Disp: , Rfl:  No current facility-administered medications for this visit.   Facility-Administered Medications Ordered in Other Visits:  .  Tbo-Filgrastim (GRANIX) injection 480 mcg, 480 mcg, Subcutaneous, Daily, Sindy Guadeloupe, MD, 480 mcg at 05/19/17 1412  Physical exam:  Vitals:   10/03/18 1131  BP: 104/62  Pulse: (!) 102  Resp: 18  Temp: (!) 96.4 F (35.8 C)  TempSrc: Tympanic  Weight: 206 lb 4.8 oz (93.6 kg)  Height: 5' 3"  (1.6 m)   Physical Exam Constitutional:      General: She is not in acute distress. HENT:     Head: Normocephalic and atraumatic.  Eyes:     Pupils: Pupils are equal, round, and reactive to light.  Neck:      Musculoskeletal: Normal range of motion.  Cardiovascular:     Rate and Rhythm: Normal rate and regular rhythm.     Heart sounds: Normal heart sounds.  Pulmonary:     Effort: Pulmonary effort is normal.     Breath sounds: Normal breath sounds.  Abdominal:     General: Bowel sounds are normal.     Palpations: Abdomen is soft.  Skin:    General: Skin is warm and dry.  Neurological:     Mental Status: She is alert and oriented to person, place, and time.      CMP Latest Ref Rng & Units 02/22/2018  Glucose 70 - 99 mg/dL 86  BUN 6 - 20 mg/dL 23(H)  Creatinine 0.44 - 1.00 mg/dL 0.84  Sodium 135 - 145 mmol/L 139  Potassium 3.5 - 5.1 mmol/L 3.6  Chloride 98 - 111 mmol/L 106  CO2 22 - 32 mmol/L 26  Calcium 8.9 - 10.3 mg/dL 9.1  Total Protein 6.5 - 8.1 g/dL 6.6  Total Bilirubin 0.3 - 1.2 mg/dL 0.6  Alkaline Phos 38 - 126 U/L 112  AST 15 - 41 U/L 18  ALT 0 - 44 U/L 21   CBC Latest Ref Rng & Units 02/22/2018  WBC 3.6 - 11.0 K/uL 4.7  Hemoglobin 12.0 - 16.0 g/dL 11.1(L)  Hematocrit 35.0 - 47.0 % 33.5(L)  Platelets 150 - 440 K/uL 187      Assessment and plan- Patient is a 58 y.o. female Stage IV triple negative invasive breast carcinoma of the right breast T3N1M1 with metastases to hilum and subcarinal nodes.  She completed 8 cycles of adjuvant Xeloda and scan showed progression of pulmonary and hilar metastases.  She is here for on treatment assessment prior to cycle 1 dose 2 of eribulin  Patient received her first dose of eribulin on 09/12/2018 at 1.4 mg/m.  This was followed by a prolonged.  Of neutropenia and she has not been able to receive her second dose for the last 3 weeks.  Done at [facility] on 10/01/2018 shows white count of 3.1 with an ANC of 1.8, H&H of 12.2/38.3 and a platelet count of 215.  LFTs are within normal limits except for a mildly elevated alkaline phosphatase of 125.  She will therefore proceed with her next dose of eribulin today but I will dose  reduce it by 25%.  She  will also receive on pro-Neulasta at this time and we will plan to give her eribulin week 1 week off with this regimen.  If her counts stay up I will consider going up on her eribulin dose subsequently.  Patient verbalized understanding.  She does bring in her own cold mittens and socks during chemotherapy to reduce the chances of chemo-induced peripheral neuropathy.  Chemo-induced peripheral neuropathy: Mild grade 1.  Stable continue to monitor she is not on any medications for this as of now.  I will see her back in 2 weeks time with CBC with differential, CMP for cycle 2-day 1 of eribulin with on pro-Neulasta support.  Labs to be done at [facility] a day or 2 prior     Visit Diagnosis 1. Encounter for antineoplastic chemotherapy   2. 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 Chemotherapy-induced peripheral neuropathy ([place])   4. Chemotherapy induced neutropenia ([facility])      Dr. Randa Evens, MD, MPH [facility] at [facility] [phone] 10/04/2018 9:34 AM

## 2018-10-08 ENCOUNTER — Other Ambulatory Visit: Payer: Self-pay | Admitting: Oncology

## 2018-10-08 DIAGNOSIS — Z7189 Other specified counseling: Secondary | ICD-10-CM

## 2018-10-10 ENCOUNTER — Ambulatory Visit: Payer: Managed Care, Other (non HMO)

## 2018-10-11 ENCOUNTER — Encounter: Payer: Self-pay | Admitting: *Deleted

## 2018-10-11 ENCOUNTER — Telehealth: Payer: Self-pay | Admitting: *Deleted

## 2018-10-11 NOTE — Telephone Encounter (Signed)
Ashley Molina, Patient left msg on triage requesting to change her apt times to later after 1145 am next week. Ashley Molina, please reach out to the patient. Thanks. Nira Conn

## 2018-10-11 NOTE — Telephone Encounter (Signed)
I have sent patient a my chart message about a different time for patient for next week

## 2018-10-16 ENCOUNTER — Encounter: Payer: Self-pay | Admitting: Oncology

## 2018-10-17 ENCOUNTER — Inpatient Hospital Stay: Payer: Managed Care, Other (non HMO)

## 2018-10-17 ENCOUNTER — Inpatient Hospital Stay: Payer: Managed Care, Other (non HMO) | Attending: Oncology | Admitting: Oncology

## 2018-10-17 ENCOUNTER — Encounter: Payer: Self-pay | Admitting: Oncology

## 2018-10-17 ENCOUNTER — Other Ambulatory Visit: Payer: Self-pay

## 2018-10-17 ENCOUNTER — Inpatient Hospital Stay: Payer: Managed Care, Other (non HMO) | Admitting: Oncology

## 2018-10-17 VITALS — BP 100/69 | HR 98 | Temp 98.0°F | Resp 18 | Ht 63.0 in | Wt 208.6 lb

## 2018-10-17 DIAGNOSIS — C7800 Secondary malignant neoplasm of unspecified lung: Secondary | ICD-10-CM | POA: Diagnosis not present

## 2018-10-17 DIAGNOSIS — D701 Agranulocytosis secondary to cancer chemotherapy: Secondary | ICD-10-CM

## 2018-10-17 DIAGNOSIS — Z5111 Encounter for antineoplastic chemotherapy: Secondary | ICD-10-CM | POA: Diagnosis present

## 2018-10-17 DIAGNOSIS — Z5189 Encounter for other specified aftercare: Secondary | ICD-10-CM | POA: Diagnosis present

## 2018-10-17 DIAGNOSIS — Z171 Estrogen receptor negative status [ER-]: Secondary | ICD-10-CM

## 2018-10-17 DIAGNOSIS — G620 Drug-induced polyneuropathy: Secondary | ICD-10-CM | POA: Diagnosis not present

## 2018-10-17 DIAGNOSIS — C778 Secondary and unspecified malignant neoplasm of lymph nodes of multiple regions: Secondary | ICD-10-CM | POA: Diagnosis not present

## 2018-10-17 DIAGNOSIS — C50911 Malignant neoplasm of unspecified site of right female breast: Secondary | ICD-10-CM | POA: Diagnosis present

## 2018-10-17 DIAGNOSIS — C50411 Malignant neoplasm of upper-outer quadrant of right female breast: Secondary | ICD-10-CM

## 2018-10-17 DIAGNOSIS — T451X5A Adverse effect of antineoplastic and immunosuppressive drugs, initial encounter: Secondary | ICD-10-CM | POA: Diagnosis not present

## 2018-10-17 DIAGNOSIS — Z923 Personal history of irradiation: Secondary | ICD-10-CM | POA: Diagnosis not present

## 2018-10-17 MED ORDER — SODIUM CHLORIDE 0.9 % IV SOLN
1.4000 mg/m2 | Freq: Once | INTRAVENOUS | Status: AC
  Administered 2018-10-17: 2.85 mg via INTRAVENOUS
  Filled 2018-10-17: qty 5.7

## 2018-10-17 MED ORDER — SODIUM CHLORIDE 0.9% FLUSH
10.0000 mL | INTRAVENOUS | Status: DC | PRN
  Administered 2018-10-17: 10 mL via INTRAVENOUS
  Filled 2018-10-17: qty 10

## 2018-10-17 MED ORDER — HEPARIN SOD (PORK) LOCK FLUSH 100 UNIT/ML IV SOLN
500.0000 [IU] | Freq: Once | INTRAVENOUS | Status: AC
  Administered 2018-10-17: 500 [IU] via INTRAVENOUS
  Filled 2018-10-17: qty 5

## 2018-10-17 MED ORDER — PEGFILGRASTIM 6 MG/0.6ML ~~LOC~~ PSKT
6.0000 mg | PREFILLED_SYRINGE | Freq: Once | SUBCUTANEOUS | Status: AC
  Administered 2018-10-17: 6 mg via SUBCUTANEOUS
  Filled 2018-10-17: qty 0.6

## 2018-10-17 MED ORDER — SODIUM CHLORIDE 0.9 % IV SOLN
Freq: Once | INTRAVENOUS | Status: AC
  Administered 2018-10-17: 14:00:00 via INTRAVENOUS
  Filled 2018-10-17: qty 250

## 2018-10-17 MED ORDER — PROCHLORPERAZINE MALEATE 10 MG PO TABS
10.0000 mg | ORAL_TABLET | Freq: Once | ORAL | Status: AC
  Administered 2018-10-17: 10 mg via ORAL
  Filled 2018-10-17: qty 1

## 2018-10-17 NOTE — Progress Notes (Signed)
Pt here and does not have any concerns

## 2018-10-18 NOTE — Progress Notes (Signed)
Hematology/Oncology Consult note [facility]  Telephone:([phone] [phone]  Patient Care Team: Patient, No Pcp Per as PCP - General (General Practice) Byrnett, Forest Gleason, MD (General Surgery) Gae Dry, MD as Referring Physician (Obstetrics and Gynecology)   Name of the patient: Ashley Molina  [id number]  03/18/1961   Date of visit: 10/18/18  Diagnosis-  Stage IVinvasive mammary carcinoma of the right breastcT3cN1cM1ER negative, PR 5% positive and HER-2/neu negativewith metastases to hilar lymph nodes  Chief complaint/ Reason for visit-on treatment assessment prior to cycle 2-day 1 of eribulin  Heme/Onc history: Patient is a 58 year old postmenopausal female who was diagnosed with 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 Her only site of metastatic disease was biopsy-proven hilar adenopathy. She was seen for second opinion at [facility] and underwent neoadjuvant chemotherapy with carbotaxol followed by dose dense AC. Posttreatment scans showed excellent response to treatment with resolution of hilar adenopathy as well as axillary adenopathy but persistent primary tumor at the right breast. She then went on to get right-sided lumpectomy along with axillary lymph node dissection. Final pathology showed 16 mm residual tumor. 0/12 Ln positive for malignancy. Grade 2. Negative margins. No LVI. ER, PR and her 2 neu negative. She also completed radiation therapy to her chest wall and hilar region at in August 2019. She then completed adjuvant xeloda.Patient also had radiation pneumonitis and was treated with a prolonged course of steroids by radiation oncology at [facility].  Patient has had baseline foundation 1 testing done which did not show any evidence of actionable mutations. PDL 1 was 0%. She also had strata testing done at [facility] which did not show any actionable mutations.  Repeat PET CT scan on 08/29/2018 showed new  enlarged hypermetabolic right paratracheal and subcarinal and AP window as well as right hilar lymph nodes compatible with nodal metastatic disease. New and enlarging hypermetabolic irregular solid pulmonary nodules consistent with pulmonary metastases.Plan is to proceed with fourth line eribulin started in April 2020.   Interval history-overall she feels well and denies any new complaints today.  She does have some baseline neuropathy in her hands and feet which is mild and well controlled.  ECOG PS- 1 Pain scale- 0 Opioid associated constipation- no  Review of systems- Review of Systems  Constitutional: Positive for malaise/fatigue. Negative for chills, fever and weight loss.  HENT: Negative for congestion, ear discharge and nosebleeds.   Eyes: Negative for blurred vision.  Respiratory: Negative for cough, hemoptysis, sputum production, shortness of breath and wheezing.   Cardiovascular: Negative for chest pain, palpitations, orthopnea and claudication.  Gastrointestinal: Negative for abdominal pain, blood in stool, constipation, diarrhea, heartburn, melena, nausea and vomiting.  Genitourinary: Negative for dysuria, flank pain, frequency, hematuria and urgency.  Musculoskeletal: Negative for back pain, joint pain and myalgias.  Skin: Negative for rash.  Neurological: Positive for sensory change (Peripheral neuropathy). Negative for dizziness, tingling, focal weakness, seizures, weakness and headaches.  Endo/Heme/Allergies: Does not bruise/bleed easily.  Psychiatric/Behavioral: Negative for depression and suicidal ideas. The patient does not have insomnia.        Allergies  Allergen Reactions  . Penicillins Rash     Past Medical History:  Diagnosis Date  . Anemia   . Breast cancer ([place])   . Breast cyst, right    aspirated by Dr. Bary Castilla  . Hyperlipidemia   . Personal history of chemotherapy   . Pre-diabetes      Past Surgical History:  Procedure Laterality  Date  .  AXILLARY LYMPH NODE BIOPSY Right 04/09/2017   Procedure: AXILLARY LYMPH NODE BIOPSY;  Surgeon: Robert Bellow, MD;  Location: [facility] ORS;  Service: General;  Laterality: Right;  . BREAST BIOPSY Right 03/27/2017   US guided breast mass - invasive mammary carcinoma  . BREAST BIOPSY Right 03/27/2017   Lymph node - metastatic carcinoma  . BREAST BIOPSY Right 04/09/2017   Lymph node   . BREAST CYST ASPIRATION Right 11/2009   Dr. Bary Castilla did FNA  . BREAST EXCISIONAL BIOPSY Right 09/28/2017   lumpectomy and 12 lympnode rad neo adj chemo  . COLONOSCOPY  2011  . DILATION AND CURETTAGE OF UTERUS     X3  . ENDOBRONCHIAL ULTRASOUND N/A 04/09/2017   Procedure: ENDOBRONCHIAL ULTRASOUND;  Surgeon: Laverle Hobby, MD;  Location: [facility] ORS;  Service: Pulmonary;  Laterality: N/A;  . ENDOMETRIAL ABLATION    . ENDOMETRIAL BIOPSY  09/2009  . PORTACATH PLACEMENT Left 04/09/2017   Procedure: INSERTION PORT-A-CATH;  Surgeon: Robert Bellow, MD;  Location: [facility] ORS;  Service: General;  Laterality: Left;    Social History   Socioeconomic History  . Marital status: Married    Spouse name: Not on file  . Number of children: Not on file  . Years of education: Not on file  . Highest education level: Not on file  Occupational History  . Not on file  Social Needs  . Financial resource strain: Not on file  . Food insecurity:    Worry: Not on file    Inability: Not on file  . Transportation needs:    Medical: Not on file    Non-medical: Not on file  Tobacco Use  . Smoking status: Never Smoker  . Smokeless tobacco: Never Used  Substance and Sexual Activity  . Alcohol use: Not Currently  . Drug use: No  . Sexual activity: Not Currently  Lifestyle  . Physical activity:    Days per week: Not on file    Minutes per session: Not on file  . Stress: Not on file  Relationships  . Social connections:    Talks on phone: Not on file    Gets together: Not on file    Attends religious service: Not  on file    Active member of club or organization: Not on file    Attends meetings of clubs or organizations: Not on file    Relationship status: Not on file  . Intimate partner violence:    Fear of current or ex partner: Not on file    Emotionally abused: Not on file    Physically abused: Not on file    Forced sexual activity: Not on file  Other Topics Concern  . Not on file  Social History Narrative  . Not on file    Family History  Problem Relation Age of Onset  . Melanoma Maternal Grandmother 62       currently 9  . Brain cancer Maternal Grandfather 90       unk. type; deceased in 7s  . Melanoma Other 41       mat grandmother's father  . Melanoma Father 70       on head; currently 62  . Prostate cancer Maternal Uncle        3 maternal uncles; dx in 69s  . Breast cancer Other        mat grandfather's sister; dx 45s     Current Outpatient Medications:  .  Biotin 2500 MCG CAPS,  Take 5,000 mcg/day by mouth daily., Disp: , Rfl:  .  Cholecalciferol (VITAMIN D3) 10000 units TABS, Take by mouth 1 day or 1 dose., Disp: , Rfl:  .  Multiple Vitamins-Minerals (CENTRUM SILVER ADULT 50+) TABS, Take 1 tablet by mouth daily., Disp: , Rfl:  .  omega-3 acid ethyl esters (LOVAZA) 1 g capsule, Take by mouth 1 day or 1 dose., Disp: , Rfl:  No current facility-administered medications for this visit.   Facility-Administered Medications Ordered in Other Visits:  .  Tbo-Filgrastim (GRANIX) injection 480 mcg, 480 mcg, Subcutaneous, Daily, Sindy Guadeloupe, MD, 480 mcg at 05/19/17 1412  Physical exam:  Vitals:   10/17/18 1321  BP: 100/69  Pulse: 98  Resp: 18  Temp: 98 F (36.7 C)  TempSrc: Tympanic  Weight: 208 lb 9.6 oz (94.6 kg)  Height: 5' 3"  (1.6 m)   Physical Exam Constitutional:      General: She is not in acute distress. HENT:     Head: Normocephalic and atraumatic.  Eyes:     Pupils: Pupils are equal, round, and reactive to light.  Neck:     Musculoskeletal: Normal  range of motion.  Cardiovascular:     Rate and Rhythm: Normal rate and regular rhythm.     Heart sounds: Normal heart sounds.  Pulmonary:     Effort: Pulmonary effort is normal.     Breath sounds: Normal breath sounds.  Abdominal:     General: Bowel sounds are normal.     Palpations: Abdomen is soft.  Skin:    General: Skin is warm and dry.  Neurological:     Mental Status: She is alert and oriented to person, place, and time.      CMP Latest Ref Rng & Units 02/22/2018  Glucose 70 - 99 mg/dL 86  BUN 6 - 20 mg/dL 23(H)  Creatinine 0.44 - 1.00 mg/dL 0.84  Sodium 135 - 145 mmol/L 139  Potassium 3.5 - 5.1 mmol/L 3.6  Chloride 98 - 111 mmol/L 106  CO2 22 - 32 mmol/L 26  Calcium 8.9 - 10.3 mg/dL 9.1  Total Protein 6.5 - 8.1 g/dL 6.6  Total Bilirubin 0.3 - 1.2 mg/dL 0.6  Alkaline Phos 38 - 126 U/L 112  AST 15 - 41 U/L 18  ALT 0 - 44 U/L 21   CBC Latest Ref Rng & Units 02/22/2018  WBC 3.6 - 11.0 K/uL 4.7  Hemoglobin 12.0 - 16.0 g/dL 11.1(L)  Hematocrit 35.0 - 47.0 % 33.5(L)  Platelets 150 - 440 K/uL 187     Assessment and plan- Patient is a 58 y.o. female with stage IV triple negative invasive mammary carcinoma of the right breast T3 N1 M1 with metastases to hilum and subcarinal lymph nodes and most recently disease progression in the lung.  She is here for on treatment assessment prior to cycle 2-day 1 of eribulin.  I will dose reduce her  eribulin by 25% due to chemo induced neutropenia and she did receive Neulasta with the last cycle.  Labs done at [facility] shows white count of 6.1 with an ANC of 4.7, H&H of 12.1/36.4 and a platelet count of 227.  She can therefore proceed with cycle 2-day 1 of eribulin today.  I will increase her dose back to 1.4 mg per metered square and she will be receiving ample Neulasta with each cycle.  She will directly proceed with cycle 2 will be 15 of eribulin in 2 weeks time and I will see her back  in 4 weeks for cycle 3-day 1 of eribulin.  Plan is to  get repeat scans after 4 cycles   Visit Diagnosis 1. Encounter for antineoplastic chemotherapy   2. Chemotherapy-induced peripheral neuropathy ([place])      Dr. Randa Evens, MD, MPH [facility] at [facility] [phone] 10/18/2018 8:42 AM

## 2018-10-30 ENCOUNTER — Encounter: Payer: Self-pay | Admitting: Oncology

## 2018-11-01 ENCOUNTER — Inpatient Hospital Stay: Payer: Managed Care, Other (non HMO)

## 2018-11-01 ENCOUNTER — Other Ambulatory Visit: Payer: Self-pay

## 2018-11-01 VITALS — BP 121/79 | HR 95 | Resp 20

## 2018-11-01 DIAGNOSIS — C50411 Malignant neoplasm of upper-outer quadrant of right female breast: Secondary | ICD-10-CM

## 2018-11-01 DIAGNOSIS — Z171 Estrogen receptor negative status [ER-]: Secondary | ICD-10-CM

## 2018-11-01 DIAGNOSIS — Z5111 Encounter for antineoplastic chemotherapy: Secondary | ICD-10-CM | POA: Diagnosis not present

## 2018-11-01 MED ORDER — PEGFILGRASTIM 6 MG/0.6ML ~~LOC~~ PSKT
6.0000 mg | PREFILLED_SYRINGE | Freq: Once | SUBCUTANEOUS | Status: AC
  Administered 2018-11-01: 6 mg via SUBCUTANEOUS
  Filled 2018-11-01: qty 0.6

## 2018-11-01 MED ORDER — HEPARIN SOD (PORK) LOCK FLUSH 100 UNIT/ML IV SOLN
500.0000 [IU] | Freq: Once | INTRAVENOUS | Status: AC | PRN
  Administered 2018-11-01: 500 [IU]
  Filled 2018-11-01: qty 5

## 2018-11-01 MED ORDER — SODIUM CHLORIDE 0.9 % IV SOLN
Freq: Once | INTRAVENOUS | Status: AC
  Administered 2018-11-01: 14:00:00 via INTRAVENOUS
  Filled 2018-11-01: qty 250

## 2018-11-01 MED ORDER — PROCHLORPERAZINE MALEATE 10 MG PO TABS
10.0000 mg | ORAL_TABLET | Freq: Once | ORAL | Status: AC
  Administered 2018-11-01: 10 mg via ORAL
  Filled 2018-11-01: qty 1

## 2018-11-01 MED ORDER — SODIUM CHLORIDE 0.9 % IV SOLN
1.4000 mg/m2 | Freq: Once | INTRAVENOUS | Status: AC
  Administered 2018-11-01: 15:00:00 2.85 mg via INTRAVENOUS
  Filled 2018-11-01: qty 5.7

## 2018-11-13 ENCOUNTER — Encounter: Payer: Self-pay | Admitting: Oncology

## 2018-11-14 ENCOUNTER — Other Ambulatory Visit: Payer: Self-pay

## 2018-11-14 ENCOUNTER — Inpatient Hospital Stay: Payer: Managed Care, Other (non HMO) | Attending: Oncology | Admitting: Oncology

## 2018-11-14 ENCOUNTER — Encounter: Payer: Self-pay | Admitting: Oncology

## 2018-11-14 ENCOUNTER — Inpatient Hospital Stay: Payer: Managed Care, Other (non HMO)

## 2018-11-14 VITALS — BP 97/66 | HR 106 | Temp 97.0°F | Resp 20 | Ht 63.0 in | Wt 207.0 lb

## 2018-11-14 DIAGNOSIS — C7800 Secondary malignant neoplasm of unspecified lung: Secondary | ICD-10-CM | POA: Insufficient documentation

## 2018-11-14 DIAGNOSIS — G629 Polyneuropathy, unspecified: Secondary | ICD-10-CM | POA: Diagnosis not present

## 2018-11-14 DIAGNOSIS — Z79899 Other long term (current) drug therapy: Secondary | ICD-10-CM | POA: Diagnosis not present

## 2018-11-14 DIAGNOSIS — C50411 Malignant neoplasm of upper-outer quadrant of right female breast: Secondary | ICD-10-CM | POA: Insufficient documentation

## 2018-11-14 DIAGNOSIS — Z923 Personal history of irradiation: Secondary | ICD-10-CM | POA: Diagnosis not present

## 2018-11-14 DIAGNOSIS — R5383 Other fatigue: Secondary | ICD-10-CM | POA: Diagnosis not present

## 2018-11-14 DIAGNOSIS — Z5111 Encounter for antineoplastic chemotherapy: Secondary | ICD-10-CM

## 2018-11-14 DIAGNOSIS — Z171 Estrogen receptor negative status [ER-]: Secondary | ICD-10-CM | POA: Insufficient documentation

## 2018-11-14 DIAGNOSIS — C778 Secondary and unspecified malignant neoplasm of lymph nodes of multiple regions: Secondary | ICD-10-CM | POA: Insufficient documentation

## 2018-11-14 MED ORDER — PROCHLORPERAZINE MALEATE 10 MG PO TABS
10.0000 mg | ORAL_TABLET | Freq: Once | ORAL | Status: AC
  Administered 2018-11-14: 10 mg via ORAL
  Filled 2018-11-14: qty 1

## 2018-11-14 MED ORDER — SODIUM CHLORIDE 0.9 % IV SOLN
1.4000 mg/m2 | Freq: Once | INTRAVENOUS | Status: AC
  Administered 2018-11-14: 2.85 mg via INTRAVENOUS
  Filled 2018-11-14: qty 5.7

## 2018-11-14 MED ORDER — HEPARIN SOD (PORK) LOCK FLUSH 100 UNIT/ML IV SOLN
500.0000 [IU] | Freq: Once | INTRAVENOUS | Status: AC | PRN
  Administered 2018-11-14: 500 [IU]
  Filled 2018-11-14: qty 5

## 2018-11-14 MED ORDER — PEGFILGRASTIM 6 MG/0.6ML ~~LOC~~ PSKT
6.0000 mg | PREFILLED_SYRINGE | Freq: Once | SUBCUTANEOUS | Status: AC
  Administered 2018-11-14: 6 mg via SUBCUTANEOUS
  Filled 2018-11-14: qty 0.6

## 2018-11-14 MED ORDER — SODIUM CHLORIDE 0.9 % IV SOLN
Freq: Once | INTRAVENOUS | Status: AC
  Administered 2018-11-14: 10:00:00 via INTRAVENOUS
  Filled 2018-11-14: qty 250

## 2018-11-14 MED ORDER — SODIUM CHLORIDE 0.9% FLUSH
10.0000 mL | INTRAVENOUS | Status: DC | PRN
  Administered 2018-11-14: 10 mL
  Filled 2018-11-14: qty 10

## 2018-11-14 NOTE — Progress Notes (Signed)
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 Her only site of metastatic disease was biopsy-proven hilar adenopathy. She was seen for second opinion at [facility] and underwent neoadjuvant chemotherapy with carbotaxol followed by dose dense AC. Posttreatment scans showed excellent response to treatment with resolution of hilar adenopathy as well as axillary adenopathy but persistent primary tumor at the right breast. She then went on to get right-sided lumpectomy along with axillary lymph node dissection. Final pathology showed 16 mm residual tumor. 0/12 Ln positive for malignancy. Grade 2. Negative margins. No LVI. ER, PR and her 2 neu negative. She also completed radiation therapy to her chest wall and hilar region at in August 2019. She then completed adjuvant xeloda.Patient also had radiation pneumonitis and was treated with a prolonged course of steroids by radiation oncology at [facility].  Patient has had baseline foundation 1 testing done which did not show any evidence of actionable mutations. PDL 1 was 0%. She also had strata testing done at [facility] which did not show any actionable mutations.  Repeat PET CT scan on 08/29/2018 showed new  enlarged hypermetabolic right paratracheal and subcarinal and AP window as well as right hilar lymph nodes compatible with nodal metastatic disease. New and enlarging hypermetabolic irregular solid pulmonary nodules consistent with pulmonary metastases.Plan is to proceed with fourth line eribulin started in April 2020.  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  She is not on any medications at this time for the same.  Overall tolerating eribulin well without any significant side effects.  Denies any shortness of breath or other symptoms.  ECOG PS- 1 Pain scale- 0 Opioid associated constipation- no  Review of systems- Review of Systems  Constitutional: Positive for malaise/fatigue. Negative for chills, fever and weight loss.  HENT: Negative for congestion, ear discharge and nosebleeds.   Eyes: Negative for blurred vision.  Respiratory: Negative for cough, hemoptysis, sputum production, shortness of breath and wheezing.   Cardiovascular: Negative for chest pain, palpitations, orthopnea and claudication.  Gastrointestinal: Negative for abdominal pain, blood in stool, constipation, diarrhea, heartburn, melena, nausea and vomiting.  Genitourinary: Negative for dysuria, flank pain, frequency, hematuria and urgency.  Musculoskeletal: Negative for back pain, joint pain and myalgias.  Skin: Negative for rash.  Neurological: Negative for dizziness, tingling, focal weakness, seizures, weakness and headaches.  Endo/Heme/Allergies: Does not bruise/bleed easily.  Psychiatric/Behavioral: Negative for depression and suicidal ideas. The patient does not have insomnia.       Allergies  Allergen Reactions  . Penicillins Rash     Past Medical History:  Diagnosis Date  . Anemia   . Breast cancer ([employer])   . Breast cyst, right    aspirated by Dr. Bary Castilla  . Hyperlipidemia   . Personal history of chemotherapy   .  Pre-diabetes       Past Surgical History:  Procedure Laterality Date  . AXILLARY LYMPH NODE BIOPSY Right 04/09/2017   Procedure: AXILLARY LYMPH NODE BIOPSY;  Surgeon: Robert Bellow, MD;  Location: [facility] ORS;  Service: General;  Laterality: Right;  . BREAST BIOPSY Right 03/27/2017   US guided breast mass - invasive mammary carcinoma  . BREAST BIOPSY Right 03/27/2017   Lymph node - metastatic carcinoma  . BREAST BIOPSY Right 04/09/2017   Lymph node   . BREAST CYST ASPIRATION Right 11/2009   Dr. Bary Castilla did FNA  . BREAST EXCISIONAL BIOPSY Right 09/28/2017   lumpectomy and 12 lympnode rad neo adj chemo  . COLONOSCOPY  2011  . DILATION AND CURETTAGE OF UTERUS     X3  . ENDOBRONCHIAL ULTRASOUND N/A 04/09/2017   Procedure: ENDOBRONCHIAL ULTRASOUND;  Surgeon: Laverle Hobby, MD;  Location: [facility] ORS;  Service: Pulmonary;  Laterality: N/A;  . ENDOMETRIAL ABLATION    . ENDOMETRIAL BIOPSY  09/2009  . PORTACATH PLACEMENT Left 04/09/2017   Procedure: INSERTION PORT-A-CATH;  Surgeon: Robert Bellow, MD;  Location: [facility] ORS;  Service: General;  Laterality: Left;    Social History   Socioeconomic History  . Marital status: Married    Spouse name: Not on file  . Number of children: Not on file  . Years of education: Not on file  . Highest education level: Not on file  Occupational History  . Not on file  Social Needs  . Financial resource strain: Not on file  . Food insecurity    Worry: Not on file    Inability: Not on file  . Transportation needs    Medical: Not on file    Non-medical: Not on file  Tobacco Use  . Smoking status: Never Smoker  . Smokeless tobacco: Never Used  Substance and Sexual Activity  . Alcohol use: Not Currently  . Drug use: No  . Sexual activity: Not Currently  Lifestyle  . Physical activity    Days per week: Not on file    Minutes per session: Not on file  . Stress: Not on file  Relationships  . Social Herbalist on phone: Not on file    Gets  together: Not on file    Attends religious service: Not on file    Active member of club or organization: Not on file    Attends meetings of clubs or organizations: Not on file    Relationship status: Not on file  . Intimate partner violence    Fear of current or ex partner: Not on file    Emotionally abused: Not on file    Physically abused: Not on file    Forced sexual activity: Not on file  Other Topics Concern  . Not on file  Social History Narrative  . Not on file    Family History  Problem Relation Age of Onset  . Melanoma Maternal Grandmother 72       currently 54  . Brain cancer Maternal Grandfather 17       unk. type; deceased in 54s  . Melanoma Other 6       mat grandmother's father  . Melanoma Father 4       on head; currently 96  . Prostate cancer Maternal Uncle        3 maternal uncles; dx in 54s  . Breast cancer Other        mat grandfather's sister; dx 39s  Current Outpatient Medications:  .  Biotin 2500 MCG CAPS, Take 5,000 mcg/day by mouth daily., Disp: , Rfl:  .  Cholecalciferol (VITAMIN D3) 10000 units TABS, Take by mouth 1 day or 1 dose., Disp: , Rfl:  .  Multiple Vitamins-Minerals (CENTRUM SILVER ADULT 50+) TABS, Take 1 tablet by mouth daily., Disp: , Rfl:  .  omega-3 acid ethyl esters (LOVAZA) 1 g capsule, Take by mouth 1 day or 1 dose., Disp: , Rfl:  No current facility-administered medications for this visit.   Facility-Administered Medications Ordered in Other Visits:  .  eriBULin mesylate (HALAVEN) 2.85 mg in sodium chloride 0.9 % 100 mL chemo infusion, 1.4 mg/m2 (Treatment Plan Recorded), Intravenous, Once, Sindy Guadeloupe, MD .  heparin lock flush 100 unit/mL, 500 Units, Intracatheter, Once PRN, Sindy Guadeloupe, MD .  pegfilgrastim (NEULASTA ONPRO KIT) injection 6 mg, 6 mg, Subcutaneous, Once, Randa Evens C, MD .  sodium chloride flush (NS) 0.9 % injection 10 mL, 10 mL, Intracatheter, PRN, Sindy Guadeloupe, MD, 10 mL at 11/14/18 3903 .   Tbo-Filgrastim (GRANIX) injection 480 mcg, 480 mcg, Subcutaneous, Daily, Sindy Guadeloupe, MD, 480 mcg at 05/19/17 1412  Physical exam:  Vitals:   11/14/18 0853  BP: 97/66  Pulse: (!) 106  Resp: 20  Temp: (!) 97 F (36.1 C)  TempSrc: Tympanic  Weight: 207 lb (93.9 kg)  Height: 5' 3"  (1.6 m)   Physical Exam Constitutional:      General: She is not in acute distress. HENT:     Head: Normocephalic and atraumatic.  Eyes:     Pupils: Pupils are equal, round, and reactive to light.  Neck:     Musculoskeletal: Normal range of motion.  Cardiovascular:     Rate and Rhythm: Normal rate and regular rhythm.     Heart sounds: Normal heart sounds.  Pulmonary:     Effort: Pulmonary effort is normal.     Breath sounds: Normal breath sounds.  Abdominal:     General: Bowel sounds are normal.     Palpations: Abdomen is soft.  Skin:    General: Skin is warm and dry.  Neurological:     Mental Status: She is alert and oriented to person, place, and time.      CMP Latest Ref Rng & Units 02/22/2018  Glucose 70 - 99 mg/dL 86  BUN 6 - 20 mg/dL 23(H)  Creatinine 0.44 - 1.00 mg/dL 0.84  Sodium 135 - 145 mmol/L 139  Potassium 3.5 - 5.1 mmol/L 3.6  Chloride 98 - 111 mmol/L 106  CO2 22 - 32 mmol/L 26  Calcium 8.9 - 10.3 mg/dL 9.1  Total Protein 6.5 - 8.1 g/dL 6.6  Total Bilirubin 0.3 - 1.2 mg/dL 0.6  Alkaline Phos 38 - 126 U/L 112  AST 15 - 41 U/L 18  ALT 0 - 44 U/L 21   CBC Latest Ref Rng & Units 02/22/2018  WBC 3.6 - 11.0 K/uL 4.7  Hemoglobin 12.0 - 16.0 g/dL 11.1(L)  Hematocrit 35.0 - 47.0 % 33.5(L)  Platelets 150 - 440 K/uL 187     Assessment and plan- Patient is a 58 y.o. female   with stage IV triple negative invasive mammary carcinoma of the right breast T3 N1 M1 with metastases to hilum and subcarinal lymph nodes and most recently disease progression in the lung.  She is here for on treatment assessment prior to cycle 3-day 1 of eribulin  Patient has been getting eribulin week 1  week  off due to neutropenia that required on pro-Neulasta support.  Her counts are okay to proceed with cycle 3-day 1 of eribulin.  She will directly proceed for cycle 3-day 15 of eribulin in 2 weeks time and I will see her back in 4 weeks for cycle 4-day 1.    Patient recently underwent CT chest with contrast about 2 weeks time to follow-up on her pneumonitis.  This was done at [facility].  I will get those images power chair for house and get as compared with her recent PET CT scan to see how she is responding to chemotherapy.  Of course she did not get a CT abdomen at that time and I may hold off on getting repeat scans for about 4 to 6 weeks based on the reports from her recent scan  Visit Diagnosis 1. Encounter for antineoplastic chemotherapy   2. Malignant neoplasm of upper-outer quadrant of right breast in female, estrogen receptor negative ([place])      Dr. Randa Evens, MD, MPH [facility] at [facility] [phone] 11/14/2018 9:52 AM

## 2018-11-14 NOTE — Progress Notes (Signed)
HR 106. Per Dr Janese Banks proceed with tx today

## 2018-11-15 ENCOUNTER — Ambulatory Visit: Payer: Managed Care, Other (non HMO) | Admitting: Oncology

## 2018-11-15 ENCOUNTER — Ambulatory Visit: Payer: Managed Care, Other (non HMO)

## 2018-11-22 IMAGING — CT NM PET TUM IMG RESTAG (PS) SKULL BASE T - THIGH
1 of 9 series · 1 of 25 positions shown · non-contrast
Comparison: 07/20/2017

CLINICAL DATA: Subsequent treatment strategy for breast cancer.

EXAM:
NUCLEAR MEDICINE PET SKULL BASE TO THIGH
TECHNIQUE: 10.9 mCi F-18 FDG was injected intravenously. Full-ring PET imaging
was performed from the skull base to thigh after the radiotracer. CT
data was obtained and used for attenuation correction and anatomic
localization.
Fasting blood glucose: 91 mg/dl

[Series 3: ct wb 5.0 b30f · axial · 5.0mm · 0.98mm/px · 1 of 290 slices shown]
[im 290/290  brain]
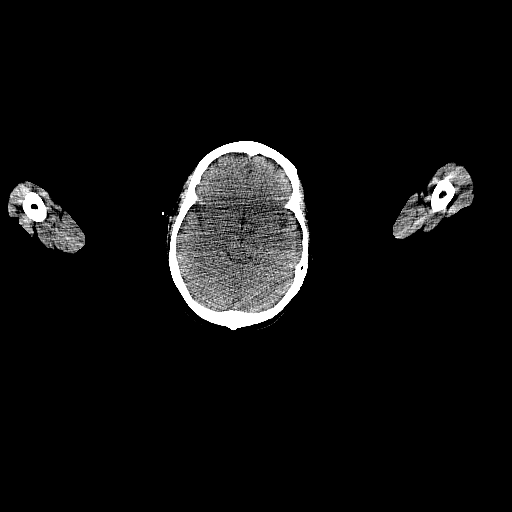

[1 of 25 positions shown; findings below may reference images not displayed]

FINDINGS: Mediastinal blood pool activity: SUV max

NECK: No hypermetabolic lymph nodes in the neck.

Incidental CT findings: none

CHEST: Similar low level uptake in the right breast at the known
lesion with SUV max = 2.6 compared to 2.1 previously.. Lesion
appears similar on CT imaging with irregular margins and no
substantial change in size. Immediately lateral to this lesion is a
stable homogeneous well-defined lesion compatible with cyst.

Incidental CT findings: Left Port-A-Cath tip is positioned at the
upper right atrium. Tiny right lower lobe pulmonary nodule (94/3) is
stable. Compressive atelectasis noted in the lower lungs
bilaterally. No pleural effusion.

ABDOMEN/PELVIS: No abnormal hypermetabolic activity within the
liver, pancreas, adrenal glands, or spleen. No hypermetabolic lymph
nodes in the abdomen or pelvis.

Incidental CT findings: Small paraumbilical hernia contains only
fat.

SKELETON: Persistent diffuse marrow uptake evident although
qualitatively decreased in the interval.

Incidental CT findings: Bone windows reveal no worrisome lytic or
sclerotic osseous lesions.
IMPRESSION: 1. Similar appearance and low level FDG accumulation in the
patient's known right breast lesion.
2. No evidence for hypermetabolic lymphadenopathy in the neck,
chest, abdomen, or pelvis.
3. Similar diffuse marrow uptake suggesting diffuse marrow
stimulation..

## 2018-11-27 ENCOUNTER — Encounter: Payer: Self-pay | Admitting: Oncology

## 2018-11-28 ENCOUNTER — Other Ambulatory Visit: Payer: Self-pay

## 2018-11-28 ENCOUNTER — Inpatient Hospital Stay: Payer: Managed Care, Other (non HMO)

## 2018-11-28 VITALS — BP 117/76 | HR 99 | Temp 97.7°F | Resp 20 | Wt 203.6 lb

## 2018-11-28 DIAGNOSIS — C50411 Malignant neoplasm of upper-outer quadrant of right female breast: Secondary | ICD-10-CM | POA: Diagnosis not present

## 2018-11-28 DIAGNOSIS — Z171 Estrogen receptor negative status [ER-]: Secondary | ICD-10-CM

## 2018-11-28 MED ORDER — SODIUM CHLORIDE 0.9% FLUSH
10.0000 mL | INTRAVENOUS | Status: DC | PRN
  Administered 2018-11-28: 10 mL
  Filled 2018-11-28: qty 10

## 2018-11-28 MED ORDER — HEPARIN SOD (PORK) LOCK FLUSH 100 UNIT/ML IV SOLN
500.0000 [IU] | Freq: Once | INTRAVENOUS | Status: AC | PRN
  Administered 2018-11-28: 500 [IU]
  Filled 2018-11-28: qty 5

## 2018-11-28 MED ORDER — PROCHLORPERAZINE MALEATE 10 MG PO TABS
10.0000 mg | ORAL_TABLET | Freq: Once | ORAL | Status: AC
  Administered 2018-11-28: 10 mg via ORAL
  Filled 2018-11-28: qty 1

## 2018-11-28 MED ORDER — SODIUM CHLORIDE 0.9 % IV SOLN
Freq: Once | INTRAVENOUS | Status: AC
  Administered 2018-11-28: 09:00:00 via INTRAVENOUS
  Filled 2018-11-28: qty 250

## 2018-11-28 MED ORDER — SODIUM CHLORIDE 0.9 % IV SOLN
1.4000 mg/m2 | Freq: Once | INTRAVENOUS | Status: AC
  Administered 2018-11-28: 2.85 mg via INTRAVENOUS
  Filled 2018-11-28: qty 5.7

## 2018-11-28 MED ORDER — PEGFILGRASTIM 6 MG/0.6ML ~~LOC~~ PSKT
6.0000 mg | PREFILLED_SYRINGE | Freq: Once | SUBCUTANEOUS | Status: AC
  Administered 2018-11-28: 6 mg via SUBCUTANEOUS
  Filled 2018-11-28: qty 0.6

## 2018-12-10 ENCOUNTER — Other Ambulatory Visit: Payer: Self-pay | Admitting: *Deleted

## 2018-12-11 ENCOUNTER — Other Ambulatory Visit: Payer: Self-pay

## 2018-12-11 ENCOUNTER — Encounter: Payer: Self-pay | Admitting: Oncology

## 2018-12-12 ENCOUNTER — Other Ambulatory Visit: Payer: Self-pay | Admitting: *Deleted

## 2018-12-12 ENCOUNTER — Encounter: Payer: Self-pay | Admitting: Oncology

## 2018-12-12 ENCOUNTER — Inpatient Hospital Stay: Payer: Managed Care, Other (non HMO)

## 2018-12-12 ENCOUNTER — Inpatient Hospital Stay: Payer: Managed Care, Other (non HMO) | Attending: Oncology | Admitting: Oncology

## 2018-12-12 ENCOUNTER — Other Ambulatory Visit: Payer: Self-pay

## 2018-12-12 VITALS — HR 100

## 2018-12-12 VITALS — BP 102/71 | HR 111 | Temp 96.9°F | Resp 18 | Wt 199.2 lb

## 2018-12-12 DIAGNOSIS — C50411 Malignant neoplasm of upper-outer quadrant of right female breast: Secondary | ICD-10-CM

## 2018-12-12 DIAGNOSIS — Z171 Estrogen receptor negative status [ER-]: Secondary | ICD-10-CM

## 2018-12-12 DIAGNOSIS — Y842 Radiological procedure and radiotherapy as the cause of abnormal reaction of the patient, or of later complication, without mention of misadventure at the time of the procedure: Secondary | ICD-10-CM | POA: Insufficient documentation

## 2018-12-12 DIAGNOSIS — G620 Drug-induced polyneuropathy: Secondary | ICD-10-CM | POA: Insufficient documentation

## 2018-12-12 DIAGNOSIS — E785 Hyperlipidemia, unspecified: Secondary | ICD-10-CM | POA: Insufficient documentation

## 2018-12-12 DIAGNOSIS — J700 Acute pulmonary manifestations due to radiation: Secondary | ICD-10-CM | POA: Diagnosis not present

## 2018-12-12 DIAGNOSIS — Z923 Personal history of irradiation: Secondary | ICD-10-CM | POA: Diagnosis not present

## 2018-12-12 DIAGNOSIS — Z79899 Other long term (current) drug therapy: Secondary | ICD-10-CM | POA: Insufficient documentation

## 2018-12-12 DIAGNOSIS — Z5111 Encounter for antineoplastic chemotherapy: Secondary | ICD-10-CM

## 2018-12-12 MED ORDER — PROCHLORPERAZINE MALEATE 10 MG PO TABS
10.0000 mg | ORAL_TABLET | Freq: Once | ORAL | Status: AC
  Administered 2018-12-12: 10:00:00 10 mg via ORAL
  Filled 2018-12-12: qty 1

## 2018-12-12 MED ORDER — PEGFILGRASTIM 6 MG/0.6ML ~~LOC~~ PSKT
6.0000 mg | PREFILLED_SYRINGE | Freq: Once | SUBCUTANEOUS | Status: AC
  Administered 2018-12-12: 6 mg via SUBCUTANEOUS
  Filled 2018-12-12: qty 0.6

## 2018-12-12 MED ORDER — SODIUM CHLORIDE 0.9 % IV SOLN
1.4000 mg/m2 | Freq: Once | INTRAVENOUS | Status: AC
  Administered 2018-12-12: 2.85 mg via INTRAVENOUS
  Filled 2018-12-12: qty 5.7

## 2018-12-12 MED ORDER — SODIUM CHLORIDE 0.9 % IV SOLN
Freq: Once | INTRAVENOUS | Status: AC
  Administered 2018-12-12: 10:00:00 via INTRAVENOUS
  Filled 2018-12-12: qty 250

## 2018-12-12 MED ORDER — SODIUM CHLORIDE 0.9% FLUSH
10.0000 mL | INTRAVENOUS | Status: DC | PRN
  Filled 2018-12-12: qty 10

## 2018-12-12 MED ORDER — HEPARIN SOD (PORK) LOCK FLUSH 100 UNIT/ML IV SOLN
500.0000 [IU] | Freq: Once | INTRAVENOUS | Status: AC | PRN
  Administered 2018-12-12: 11:00:00 500 [IU]
  Filled 2018-12-12: qty 5

## 2018-12-12 NOTE — Progress Notes (Signed)
Pt in for follow up reports having "spasm like coughing spells".

## 2018-12-12 NOTE — Progress Notes (Signed)
Open in error

## 2018-12-12 NOTE — Progress Notes (Signed)
Hematology/Oncology Consult note [facility]  [phone] [phone]  Patient Care Team: Patient, No Pcp Per as PCP - General (General Practice) Byrnett, Forest Gleason, MD (General Surgery) Gae Dry, MD as Referring Physician (Obstetrics and Gynecology)   Name of the patient: Ashley Molina  [id number]  1960-10-30   Date of visit: 12/12/18  Diagnosis- Stage IVinvasive mammary carcinoma of the right breastcT3cN1cM1ER negative, PR 5% positive and HER-2/neu negativewith metastases to hilar lymph nodes  Chief complaint/ Reason for visit-on treatment assessment prior to cycle 4-day 1 of eribulin  Heme/Onc history: Patient is a 58 year old postmenopausal female who was diagnosed with 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 Her only site of metastatic disease was biopsy-proven hilar adenopathy. She was seen for second opinion at [facility] and underwent neoadjuvant chemotherapy with carbotaxol followed by dose dense AC. Posttreatment scans showed excellent response to treatment with resolution of hilar adenopathy as well as axillary adenopathy but persistent primary tumor at the right breast. She then went on to get right-sided lumpectomy along with axillary lymph node dissection. Final pathology showed 16 mm residual tumor. 0/12 Ln positive for malignancy. Grade 2. Negative margins. No LVI. ER, PR and her 2 neu negative. She also completed radiation therapy to her chest wall and hilar region at in August 2019. She then completed adjuvant xeloda.Patient also had radiation pneumonitis and was treated with a prolonged course of steroids by radiation oncology at [facility].  Patient has had baseline foundation 1 testing done which did not show any evidence of actionable mutations. PDL 1 was 0%. She also had strata testing done at [facility] which did not show any actionable mutations.  Repeat PET CT scan on 08/29/2018 showed new  enlarged hypermetabolic right paratracheal and subcarinal and AP window as well as right hilar lymph nodes compatible with nodal metastatic disease. New and enlarging hypermetabolic irregular solid pulmonary nodules consistent with pulmonary metastases.Plan is to proceed with fourth line eribulinstarted in April 2020.  Interval history-patient has had longstanding cough since she developed radiation pneumonitis in the past.  Feels that her cough is a little increased over the last couple of weeks.  Denies any fever.  She has had intermittent chronic shortness of breath on exertion since her pneumonitis which has remained unchanged.  Peripheral neuropathy in her hands are stable.  ECOG PS- 1 Pain scale- 0 Opioid associated constipation- no  Review of systems- Review of Systems  Constitutional: Positive for malaise/fatigue. Negative for chills, fever and weight loss.  HENT: Negative for congestion, ear discharge and nosebleeds.   Eyes: Negative for blurred vision.  Respiratory: Positive for cough and shortness of breath. Negative for hemoptysis, sputum production and wheezing.   Cardiovascular: Negative for chest pain, palpitations, orthopnea and claudication.  Gastrointestinal: Negative for abdominal pain, blood in stool, constipation, diarrhea, heartburn, melena, nausea and vomiting.  Genitourinary: Negative for dysuria, flank pain, frequency, hematuria and urgency.  Musculoskeletal: Negative for back pain, joint pain and myalgias.  Skin: Negative for rash.  Neurological: Positive for sensory change (Peripheral neuropathy). Negative for dizziness, tingling, focal weakness, seizures, weakness and headaches.  Endo/Heme/Allergies: Does not bruise/bleed easily.  Psychiatric/Behavioral: Negative for depression and suicidal ideas. The patient does not have insomnia.       Allergies  Allergen Reactions  . Penicillins Rash     Past Medical History:  Diagnosis Date  . Anemia   . Breast  cancer ([place])   . Breast cyst, right  aspirated by Dr. Bary Castilla  . Hyperlipidemia   . Personal history of chemotherapy   . Pre-diabetes      Past Surgical History:  Procedure Laterality Date  . AXILLARY LYMPH NODE BIOPSY Right 04/09/2017   Procedure: AXILLARY LYMPH NODE BIOPSY;  Surgeon: Robert Bellow, MD;  Location: [facility] ORS;  Service: General;  Laterality: Right;  . BREAST BIOPSY Right 03/27/2017   US guided breast mass - invasive mammary carcinoma  . BREAST BIOPSY Right 03/27/2017   Lymph node - metastatic carcinoma  . BREAST BIOPSY Right 04/09/2017   Lymph node   . BREAST CYST ASPIRATION Right 11/2009   Dr. Bary Castilla did FNA  . BREAST EXCISIONAL BIOPSY Right 09/28/2017   lumpectomy and 12 lympnode rad neo adj chemo  . COLONOSCOPY  2011  . DILATION AND CURETTAGE OF UTERUS     X3  . ENDOBRONCHIAL ULTRASOUND N/A 04/09/2017   Procedure: ENDOBRONCHIAL ULTRASOUND;  Surgeon: Laverle Hobby, MD;  Location: [facility] ORS;  Service: Pulmonary;  Laterality: N/A;  . ENDOMETRIAL ABLATION    . ENDOMETRIAL BIOPSY  09/2009  . PORTACATH PLACEMENT Left 04/09/2017   Procedure: INSERTION PORT-A-CATH;  Surgeon: Robert Bellow, MD;  Location: [facility] ORS;  Service: General;  Laterality: Left;    Social History   Socioeconomic History  . Marital status: Married    Spouse name: Not on file  . Number of children: Not on file  . Years of education: Not on file  . Highest education level: Not on file  Occupational History  . Not on file  Social Needs  . Financial resource strain: Not on file  . Food insecurity    Worry: Not on file    Inability: Not on file  . Transportation needs    Medical: Not on file    Non-medical: Not on file  Tobacco Use  . Smoking status: Never Smoker  . Smokeless tobacco: Never Used  Substance and Sexual Activity  . Alcohol use: Not Currently  . Drug use: No  . Sexual activity: Not Currently  Lifestyle  . Physical activity    Days per week: Not on  file    Minutes per session: Not on file  . Stress: Not on file  Relationships  . Social Herbalist on phone: Not on file    Gets together: Not on file    Attends religious service: Not on file    Active member of club or organization: Not on file    Attends meetings of clubs or organizations: Not on file    Relationship status: Not on file  . Intimate partner violence    Fear of current or ex partner: Not on file    Emotionally abused: Not on file    Physically abused: Not on file    Forced sexual activity: Not on file  Other Topics Concern  . Not on file  Social History Narrative  . Not on file    Family History  Problem Relation Age of Onset  . Melanoma Maternal Grandmother 72       currently 40  . Brain cancer Maternal Grandfather 41       unk. type; deceased in 40s  . Melanoma Other 30       mat grandmother's father  . Melanoma Father 51       on head; currently 80  . Prostate cancer Maternal Uncle        3 maternal uncles; dx in 86s  . Breast  cancer Other        mat grandfather's sister; dx 2s     Current Outpatient Medications:  .  Biotin 2500 MCG CAPS, Take 5,000 mcg/day by mouth daily., Disp: , Rfl:  .  Cholecalciferol (VITAMIN D3) 10000 units TABS, Take by mouth 1 day or 1 dose., Disp: , Rfl:  .  Multiple Vitamins-Minerals (CENTRUM SILVER ADULT 50+) TABS, Take 1 tablet by mouth daily., Disp: , Rfl:  .  omega-3 acid ethyl esters (LOVAZA) 1 g capsule, Take by mouth 1 day or 1 dose., Disp: , Rfl:  No current facility-administered medications for this visit.   Facility-Administered Medications Ordered in Other Visits:  .  Tbo-Filgrastim (GRANIX) injection 480 mcg, 480 mcg, Subcutaneous, Daily, Sindy Guadeloupe, MD, 480 mcg at 05/19/17 1412  Physical exam:  Vitals:   12/12/18 0909  BP: 102/71  Pulse: (!) 111  Resp: 18  Temp: (!) 96.9 F (36.1 C)  TempSrc: Tympanic  SpO2: 96%  Weight: 199 lb 3.2 oz (90.4 kg)   Physical Exam HENT:     Head:  Normocephalic and atraumatic.  Eyes:     Pupils: Pupils are equal, round, and reactive to light.  Neck:     Musculoskeletal: Normal range of motion.  Cardiovascular:     Rate and Rhythm: Regular rhythm. Tachycardia present.     Heart sounds: Normal heart sounds.  Pulmonary:     Effort: Pulmonary effort is normal.     Breath sounds: Normal breath sounds.  Abdominal:     General: Bowel sounds are normal.     Palpations: Abdomen is soft.  Skin:    General: Skin is warm and dry.  Neurological:     Mental Status: She is alert and oriented to person, place, and time.      CMP Latest Ref Rng & Units 02/22/2018  Glucose 70 - 99 mg/dL 86  BUN 6 - 20 mg/dL 23(H)  Creatinine 0.44 - 1.00 mg/dL 0.84  Sodium 135 - 145 mmol/L 139  Potassium 3.5 - 5.1 mmol/L 3.6  Chloride 98 - 111 mmol/L 106  CO2 22 - 32 mmol/L 26  Calcium 8.9 - 10.3 mg/dL 9.1  Total Protein 6.5 - 8.1 g/dL 6.6  Total Bilirubin 0.3 - 1.2 mg/dL 0.6  Alkaline Phos 38 - 126 U/L 112  AST 15 - 41 U/L 18  ALT 0 - 44 U/L 21   CBC Latest Ref Rng & Units 02/22/2018  WBC 3.6 - 11.0 K/uL 4.7  Hemoglobin 12.0 - 16.0 g/dL 11.1(L)  Hematocrit 35.0 - 47.0 % 33.5(L)  Platelets 150 - 440 K/uL 187      Assessment and plan- Patient is a 58 y.o. female with stage IV triple negative invasive mammary carcinoma of the right breast T3 N1 M1 with metastases to hilum and subcarinal lymph nodes and most recently disease progression in the lung.   She is here for on treatment assessment prior to cycle 4-day 1 of eribulin  Labs that were done at [facility] on 12/10/2018 were reviewed.  CBC showed white count of 6.7, H&H of 12.3/39.2 and a platelet count of 250.  ANC was 5.  Counts are therefore okay to proceed with cycle 4-day 1 of eribulin today.  She will directly proceed for cycle 4-day 15 in 2 weeks time and I will see her back in 4 weeks for cycle 5-day 1.  CBC with differential, CMP will be done at 2 weeks in 4 weeks 1 to 2 days prior to  her  treatment at [facility].  Patient did undergo a CT chest in June 2020 at [facility] to follow-up her radiation pneumonitis.  At that time CT abdomen was not done.  I will proceed with a repeat CT chest abdomen and pelvis with contrast in 3 weeks time prior to her next visit.  Cough: Chronic likely secondary to pneumonitis.  She will be getting a CT scan in 3 weeks time.  If her cough worsens and/or she develops worsening shortness of breath or fever I will consider getting COVID test at that time  Chemo-induced peripheral neuropathy: Mild grade 1.  Continue to monitor   Visit Diagnosis 1. Malignant neoplasm of upper-outer quadrant of right breast in female, estrogen receptor negative ([place])   2. Encounter for antineoplastic chemotherapy   3. Chemotherapy-induced peripheral neuropathy ([place])      Dr. Randa Evens, MD, MPH [facility] at [facility] [phone] 12/12/2018 9:26 AM

## 2018-12-25 ENCOUNTER — Encounter: Payer: Self-pay | Admitting: Oncology

## 2018-12-26 ENCOUNTER — Other Ambulatory Visit: Payer: Self-pay

## 2018-12-26 ENCOUNTER — Inpatient Hospital Stay: Payer: Managed Care, Other (non HMO)

## 2018-12-26 VITALS — BP 132/84 | HR 99 | Temp 98.6°F | Resp 20 | Wt 198.2 lb

## 2018-12-26 DIAGNOSIS — C50411 Malignant neoplasm of upper-outer quadrant of right female breast: Secondary | ICD-10-CM | POA: Diagnosis not present

## 2018-12-26 DIAGNOSIS — Z171 Estrogen receptor negative status [ER-]: Secondary | ICD-10-CM

## 2018-12-26 MED ORDER — HEPARIN SOD (PORK) LOCK FLUSH 100 UNIT/ML IV SOLN
500.0000 [IU] | Freq: Once | INTRAVENOUS | Status: AC | PRN
  Administered 2018-12-26: 11:00:00 500 [IU]
  Filled 2018-12-26: qty 5

## 2018-12-26 MED ORDER — PEGFILGRASTIM 6 MG/0.6ML ~~LOC~~ PSKT
6.0000 mg | PREFILLED_SYRINGE | Freq: Once | SUBCUTANEOUS | Status: AC
  Administered 2018-12-26: 11:00:00 6 mg via SUBCUTANEOUS
  Filled 2018-12-26: qty 0.6

## 2018-12-26 MED ORDER — SODIUM CHLORIDE 0.9 % IV SOLN
Freq: Once | INTRAVENOUS | Status: AC
  Administered 2018-12-26: 10:00:00 via INTRAVENOUS
  Filled 2018-12-26: qty 250

## 2018-12-26 MED ORDER — SODIUM CHLORIDE 0.9% FLUSH
10.0000 mL | INTRAVENOUS | Status: DC | PRN
  Administered 2018-12-26: 10:00:00 10 mL
  Filled 2018-12-26: qty 10

## 2018-12-26 MED ORDER — PROCHLORPERAZINE MALEATE 10 MG PO TABS
10.0000 mg | ORAL_TABLET | Freq: Once | ORAL | Status: AC
  Administered 2018-12-26: 10:00:00 10 mg via ORAL
  Filled 2018-12-26: qty 1

## 2018-12-26 MED ORDER — SODIUM CHLORIDE 0.9 % IV SOLN
1.4000 mg/m2 | Freq: Once | INTRAVENOUS | Status: AC
  Administered 2018-12-26: 10:00:00 2.85 mg via INTRAVENOUS
  Filled 2018-12-26: qty 5.7

## 2019-01-02 ENCOUNTER — Other Ambulatory Visit: Payer: Self-pay | Admitting: Oncology

## 2019-01-02 ENCOUNTER — Ambulatory Visit
Admission: RE | Admit: 2019-01-02 | Discharge: 2019-01-02 | Disposition: A | Discharge status: Home / Self Care | Payer: Managed Care, Other (non HMO) | Source: Ambulatory Visit | Attending: Oncology | Admitting: Oncology

## 2019-01-02 ENCOUNTER — Other Ambulatory Visit: Payer: Self-pay

## 2019-01-02 DIAGNOSIS — Z171 Estrogen receptor negative status [ER-]: Secondary | ICD-10-CM | POA: Insufficient documentation

## 2019-01-02 DIAGNOSIS — C50411 Malignant neoplasm of upper-outer quadrant of right female breast: Secondary | ICD-10-CM | POA: Insufficient documentation

## 2019-01-02 MED ORDER — IOHEXOL 300 MG/ML  SOLN
100.0000 mL | Freq: Once | INTRAMUSCULAR | Status: AC | PRN
  Administered 2019-01-02: 09:00:00 100 mL via INTRAVENOUS

## 2019-01-08 ENCOUNTER — Other Ambulatory Visit: Payer: Self-pay

## 2019-01-08 ENCOUNTER — Encounter: Payer: Self-pay | Admitting: Oncology

## 2019-01-09 ENCOUNTER — Other Ambulatory Visit: Payer: Managed Care, Other (non HMO)

## 2019-01-09 ENCOUNTER — Telehealth: Payer: Self-pay | Admitting: *Deleted

## 2019-01-09 ENCOUNTER — Encounter: Payer: Self-pay | Admitting: Oncology

## 2019-01-09 ENCOUNTER — Inpatient Hospital Stay: Payer: Managed Care, Other (non HMO)

## 2019-01-09 ENCOUNTER — Other Ambulatory Visit: Payer: Self-pay

## 2019-01-09 ENCOUNTER — Other Ambulatory Visit: Payer: Self-pay | Admitting: *Deleted

## 2019-01-09 ENCOUNTER — Inpatient Hospital Stay (HOSPITAL_BASED_OUTPATIENT_CLINIC_OR_DEPARTMENT_OTHER): Payer: Managed Care, Other (non HMO) | Admitting: Oncology

## 2019-01-09 VITALS — BP 110/75 | HR 102 | Temp 97.2°F | Resp 18 | Ht 63.0 in | Wt 198.4 lb

## 2019-01-09 DIAGNOSIS — R5383 Other fatigue: Secondary | ICD-10-CM | POA: Diagnosis not present

## 2019-01-09 DIAGNOSIS — R234 Changes in skin texture: Secondary | ICD-10-CM | POA: Insufficient documentation

## 2019-01-09 DIAGNOSIS — Z171 Estrogen receptor negative status [ER-]: Secondary | ICD-10-CM | POA: Insufficient documentation

## 2019-01-09 DIAGNOSIS — C50411 Malignant neoplasm of upper-outer quadrant of right female breast: Secondary | ICD-10-CM | POA: Insufficient documentation

## 2019-01-09 DIAGNOSIS — I700 Atherosclerosis of aorta: Secondary | ICD-10-CM | POA: Insufficient documentation

## 2019-01-09 DIAGNOSIS — R0602 Shortness of breath: Secondary | ICD-10-CM | POA: Diagnosis not present

## 2019-01-09 DIAGNOSIS — Z808 Family history of malignant neoplasm of other organs or systems: Secondary | ICD-10-CM | POA: Insufficient documentation

## 2019-01-09 DIAGNOSIS — Z9221 Personal history of antineoplastic chemotherapy: Secondary | ICD-10-CM | POA: Insufficient documentation

## 2019-01-09 DIAGNOSIS — Z880 Allergy status to penicillin: Secondary | ICD-10-CM | POA: Insufficient documentation

## 2019-01-09 DIAGNOSIS — Z803 Family history of malignant neoplasm of breast: Secondary | ICD-10-CM | POA: Insufficient documentation

## 2019-01-09 DIAGNOSIS — J9601 Acute respiratory failure with hypoxia: Secondary | ICD-10-CM | POA: Insufficient documentation

## 2019-01-09 DIAGNOSIS — J90 Pleural effusion, not elsewhere classified: Secondary | ICD-10-CM | POA: Insufficient documentation

## 2019-01-09 DIAGNOSIS — Z79899 Other long term (current) drug therapy: Secondary | ICD-10-CM | POA: Insufficient documentation

## 2019-01-09 DIAGNOSIS — R05 Cough: Secondary | ICD-10-CM | POA: Insufficient documentation

## 2019-01-09 DIAGNOSIS — J189 Pneumonia, unspecified organism: Secondary | ICD-10-CM | POA: Insufficient documentation

## 2019-01-09 DIAGNOSIS — Z8042 Family history of malignant neoplasm of prostate: Secondary | ICD-10-CM | POA: Insufficient documentation

## 2019-01-09 DIAGNOSIS — C771 Secondary and unspecified malignant neoplasm of intrathoracic lymph nodes: Secondary | ICD-10-CM | POA: Insufficient documentation

## 2019-01-09 DIAGNOSIS — Z923 Personal history of irradiation: Secondary | ICD-10-CM | POA: Insufficient documentation

## 2019-01-09 MED ORDER — PREDNISONE 20 MG PO TABS: 80.0000 mg | ORAL_TABLET | Freq: Every day | ORAL | 0 refills | Status: DC

## 2019-01-09 MED ORDER — PANTOPRAZOLE SODIUM 20 MG PO TBEC: 20.0000 mg | DELAYED_RELEASE_TABLET | Freq: Every day | ORAL | 0 refills | Status: DC

## 2019-01-09 NOTE — Telephone Encounter (Signed)
I checked pt. On RA sitting and her sat 93%, I walked her around the cancer center and he sats dropped to 87%. After walking she sat back down and within 1-2 min. Sat came back to 91%. Pt. After walking 50 feet states she feels like her breathing is heavy. MD wants pt to have to have oxygen PRN when needed.  I have called brad at [employer] to see if her insurance is in network with the company. I have left message with Leroy Sea to call me back after checking

## 2019-01-09 NOTE — Progress Notes (Signed)
Pt coughing getting worse, she sleep in a chair with pillows to make her up as much as possible. Wearing mask makes her  Sob. Sat 93% on RA. Her voice has changed due to the coughing and she has pain in her back around shoulder blades due to her cough. She is eating and drinking but she feels like she id dec. On eating and the food does not taste good and sometimes she does not feel hungry but eats because she has too.

## 2019-01-09 NOTE — Telephone Encounter (Signed)
I called and spoke to [employer] for oxygen. Spoke to [place] and he states that all the numbers I have is good but after pt. Drop while walking on RA we are then suppose to put them on oxygen and see if while ambulating the sats come back up and then she will qualify for oxygen and they ar in network with her [employer].  I called pt and let her know the info above and wanted to know if it would be ok with pt if she would come over tom. And I recheck her numbers and put her on oxygen to see if she drops again and if oxygen helps. She is agreeable and will be her at 12:15

## 2019-01-09 NOTE — Progress Notes (Signed)
Tumor Board Documentation  Ashley Molina was presented by Dr Janese Banks at our Tumor Board on 01/09/2019, which included representatives from medical oncology, radiation oncology, surgical, radiology, pathology, internal medicine, navigation, research, palliative care.  Ashley Molina currently presents as a current patient, for discussion, for new tumor(s) with history of the following treatments: active survellience, adjuvant chemotherapy.  Additionally, we reviewed previous medical and familial history, history of present illness, and recent lab results along with all available histopathologic and imaging studies. The tumor board considered available treatment options and made the following recommendations:   refer to Pulmanology, possible Bronchoscopy, Possibly change chemotherapy regimen  The following procedures/referrals were also placed: No orders of the defined types were placed in this encounter.   Clinical Trial Status: not discussed   Staging used:    National site-specific guidelines   were discussed with respect to the case.  Tumor board is a meeting of clinicians from various specialty areas who evaluate and discuss patients for whom a multidisciplinary approach is being considered. Final determinations in the plan of care are those of the provider(s). The responsibility for follow up of recommendations given during tumor board is that of the provider.   Today's extended care, comprehensive team conference, Ashley Molina was not present for the discussion and was not examined.   Multidisciplinary Tumor Board is a multidisciplinary case peer review process.  Decisions discussed in the Multidisciplinary Tumor Board reflect the opinions of the specialists present at the conference without having examined the patient.  Ultimately, treatment and diagnostic decisions rest with the primary provider(s) and the patient.

## 2019-01-10 ENCOUNTER — Encounter: Payer: Self-pay | Admitting: *Deleted

## 2019-01-10 ENCOUNTER — Other Ambulatory Visit: Payer: Self-pay | Admitting: *Deleted

## 2019-01-10 ENCOUNTER — Telehealth: Payer: Self-pay | Admitting: Internal Medicine

## 2019-01-10 DIAGNOSIS — R0602 Shortness of breath: Secondary | ICD-10-CM

## 2019-01-10 DIAGNOSIS — C50911 Malignant neoplasm of unspecified site of right female breast: Secondary | ICD-10-CM

## 2019-01-10 MED ORDER — HYDROCODONE-HOMATROPINE 5-1.5 MG/5ML PO SYRP: 5.0000 mL | ORAL_SOLUTION | Freq: Four times a day (QID) | ORAL | 0 refills | Status: DC | PRN

## 2019-01-10 NOTE — Telephone Encounter (Signed)
Spoke with the pt  She is unsure when spouses test results will be coming back  Changed her visit to a virtual visit in this case and she is ok with this plan  Nothing further needed

## 2019-01-10 NOTE — Telephone Encounter (Signed)
Called patient for COVID-19 pre-screening for in office visit.  Have you recently traveled any where out of the local area in the last 2 weeks? No  Have you been in close contact with a person diagnosed with COVID-19 or someone awaiting results within the last 2 weeks? Yes- Husband who is having surgery and is required to get tested  Do you currently have any of the following symptoms? If so, when did they start? Cough (yes- 6 months)  Diarrhea              Joint Pain Fever      Muscle Pain   Red eyes Shortness of breath (yes- 6 months) Abdominal pain              Vomiting Loss of smell    Rash    Sore Throat Headache    Weakness   Bruising or bleeding   Okay to proceed with visit. (date)  / Needs to reschedule visit. (date)

## 2019-01-10 NOTE — Progress Notes (Signed)
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 Her only site of metastatic disease was biopsy-proven hilar adenopathy. She was seen for second opinion at [facility] and underwent neoadjuvant chemotherapy with carbotaxol followed by dose dense AC. Posttreatment scans showed excellent response to treatment with resolution of hilar adenopathy as well as axillary adenopathy but persistent primary tumor at the right breast. She then went on to get right-sided lumpectomy along with axillary lymph node dissection. Final pathology showed 16 mm residual tumor. 0/12 Ln positive for malignancy. Grade 2. Negative margins. No LVI. ER, PR and her 2 neu negative. She also completed radiation therapy to her chest wall and hilar region at in August 2019. She then completed adjuvant xeloda.Patient also had radiation pneumonitis and was treated with a prolonged course of steroids by radiation oncology at [facility].  Patient has had baseline foundation 1 testing done which did not show any evidence of actionable mutations. PDL 1 was 0%. She also had strata testing done at [facility] which did not show any actionable mutations.  Repeat PET CT scan on 08/29/2018 showed new enlarged  hypermetabolic right paratracheal and subcarinal and AP window as well as right hilar lymph nodes compatible with nodal metastatic disease. New and enlarging hypermetabolic irregular solid pulmonary nodules consistent with pulmonary metastases.Plan is to proceed with fourth line eribulinstarted in April 2020.  Interval history-patient reports feeling more short of breath over the last 2 weeks.  She states even walking to her mailbox makes her run out of breath.  Denies any fever.  She has some baseline nonproductive cough.  ECOG PS- 1 Pain scale- 0 Opioid associated constipation- no  Review of systems- Review of Systems  Constitutional: Negative for chills, fever, malaise/fatigue and weight loss.  HENT: Negative for congestion, ear discharge and nosebleeds.   Eyes: Negative for blurred vision.  Respiratory: Positive for cough and shortness of breath. Negative for hemoptysis, sputum production and wheezing.   Cardiovascular: Negative for chest pain, palpitations, orthopnea and claudication.  Gastrointestinal: Negative for abdominal pain, blood in stool, constipation, diarrhea, heartburn, melena, nausea and vomiting.  Genitourinary: Negative for dysuria, flank pain, frequency, hematuria and urgency.  Musculoskeletal: Negative for back pain, joint pain and myalgias.  Skin: Negative for rash.  Neurological: Negative for dizziness, tingling, focal weakness, seizures, weakness and headaches.  Endo/Heme/Allergies: Does not bruise/bleed easily.  Psychiatric/Behavioral: Negative for depression and suicidal ideas. The patient does not have insomnia.        Allergies  Allergen Reactions   Penicillins Rash     Past Medical History:  Diagnosis Date   Anemia    Breast cancer, right ([place]) 04/2017   Hx Lumpectomy, Chemo + Rad tx's.   Breast cyst, right    aspirated by Dr. Bary Castilla   Hyperlipidemia    Personal history of chemotherapy    Pre-diabetes  Past Surgical History:    Procedure Laterality Date   AXILLARY LYMPH NODE BIOPSY Right 04/09/2017   Procedure: AXILLARY LYMPH NODE BIOPSY;  Surgeon: Robert Bellow, MD;  Location: [facility] ORS;  Service: General;  Laterality: Right;   BREAST BIOPSY Right 03/27/2017   US guided breast mass - invasive mammary carcinoma   BREAST BIOPSY Right 03/27/2017   Lymph node - metastatic carcinoma   BREAST BIOPSY Right 04/09/2017   Lymph node    BREAST CYST ASPIRATION Right 11/2009   Dr. Bary Castilla did FNA   BREAST EXCISIONAL BIOPSY Right 09/28/2017   lumpectomy and 12 lympnode rad neo adj chemo   COLONOSCOPY  2011   DILATION AND CURETTAGE OF UTERUS     X3   ENDOBRONCHIAL ULTRASOUND N/A 04/09/2017   Procedure: ENDOBRONCHIAL ULTRASOUND;  Surgeon: Laverle Hobby, MD;  Location: [facility] ORS;  Service: Pulmonary;  Laterality: N/A;   ENDOMETRIAL ABLATION     ENDOMETRIAL BIOPSY  09/2009   PORTACATH PLACEMENT Left 04/09/2017   Procedure: INSERTION PORT-A-CATH;  Surgeon: Robert Bellow, MD;  Location: [facility] ORS;  Service: General;  Laterality: Left;    Social History   Socioeconomic History   Marital status: Married    Spouse name: Not on file   Number of children: Not on file   Years of education: Not on file   Highest education level: Not on file  Occupational History   Not on file  Social Needs   Financial resource strain: Not on file   Food insecurity    Worry: Not on file    Inability: Not on file   Transportation needs    Medical: Not on file    Non-medical: Not on file  Tobacco Use   Smoking status: Never Smoker   Smokeless tobacco: Never Used  Substance and Sexual Activity   Alcohol use: Not Currently   Drug use: No   Sexual activity: Not Currently  Lifestyle   Physical activity    Days per week: Not on file    Minutes per session: Not on file   Stress: Not on file  Relationships   Social connections    Talks on phone: Not on file    Gets together: Not on file     Attends religious service: Not on file    Active member of club or organization: Not on file    Attends meetings of clubs or organizations: Not on file    Relationship status: Not on file   Intimate partner violence    Fear of current or ex partner: Not on file    Emotionally abused: Not on file    Physically abused: Not on file    Forced sexual activity: Not on file  Other Topics Concern   Not on file  Social History Narrative   Not on file    Family History  Problem Relation Age of Onset   Melanoma Maternal Grandmother 98       currently 40   Brain cancer Maternal Grandfather 13       unk. type; deceased in 34s   Melanoma Other 56       mat grandmother's father   Melanoma Father 16       on head; currently 97   Prostate cancer Maternal Uncle        3 maternal uncles; dx in 49s   Breast cancer Other        mat grandfather's sister; dx 22s     Current Outpatient Medications:  Biotin 2500 MCG CAPS, Take 5,000 mcg/day by mouth daily., Disp: , Rfl:    Cholecalciferol (VITAMIN D3) 10000 units TABS, Take by mouth 1 day or 1 dose., Disp: , Rfl:    Multiple Vitamins-Minerals (CENTRUM SILVER ADULT 50+) TABS, Take 1 tablet by mouth daily., Disp: , Rfl:    omega-3 acid ethyl esters (LOVAZA) 1 g capsule, Take by mouth 1 day or 1 dose., Disp: , Rfl:    HYDROcodone-homatropine (HYCODAN) 5-1.5 MG/5ML syrup, Take 5 mLs by mouth every 6 (six) hours as needed for cough., Disp: 240 mL, Rfl: 0   pantoprazole (PROTONIX) 20 MG tablet, Take 1 tablet (20 mg total) by mouth daily., Disp: 10 tablet, Rfl: 0   predniSONE (DELTASONE) 20 MG tablet, Take 4 tablets (80 mg total) by mouth daily with breakfast., Disp: 40 tablet, Rfl: 0 No current facility-administered medications for this visit.   Facility-Administered Medications Ordered in Other Visits:    Tbo-Filgrastim (GRANIX) injection 480 mcg, 480 mcg, Subcutaneous, Daily, Sindy Guadeloupe, MD, 480 mcg at 05/19/17 1412  Physical  exam:  Vitals:   01/09/19 0902 01/09/19 0925  BP: 110/75   Pulse: (!) 102   Resp: 18   Temp: (!) 97.2 F (36.2 C)   SpO2:  93%  Weight: 198 lb 6.4 oz (90 kg)   Height: 5' 3" (1.6 m)    Physical Exam HENT:     Head: Normocephalic and atraumatic.  Eyes:     Pupils: Pupils are equal, round, and reactive to light.  Neck:     Musculoskeletal: Normal range of motion.  Cardiovascular:     Rate and Rhythm: Normal rate and regular rhythm.     Heart sounds: Normal heart sounds.  Pulmonary:     Breath sounds: Normal breath sounds.     Comments: Mildly increased effort Abdominal:     General: Bowel sounds are normal.     Palpations: Abdomen is soft.  Skin:    General: Skin is warm and dry.  Neurological:     Mental Status: She is alert and oriented to person, place, and time.      CMP Latest Ref Rng & Units 02/22/2018  Glucose 70 - 99 mg/dL 86  BUN 6 - 20 mg/dL 23(H)  Creatinine 0.44 - 1.00 mg/dL 0.84  Sodium 135 - 145 mmol/L 139  Potassium 3.5 - 5.1 mmol/L 3.6  Chloride 98 - 111 mmol/L 106  CO2 22 - 32 mmol/L 26  Calcium 8.9 - 10.3 mg/dL 9.1  Total Protein 6.5 - 8.1 g/dL 6.6  Total Bilirubin 0.3 - 1.2 mg/dL 0.6  Alkaline Phos 38 - 126 U/L 112  AST 15 - 41 U/L 18  ALT 0 - 44 U/L 21   CBC Latest Ref Rng & Units 02/22/2018  WBC 3.6 - 11.0 K/uL 4.7  Hemoglobin 12.0 - 16.0 g/dL 11.1(L)  Hematocrit 35.0 - 47.0 % 33.5(L)  Platelets 150 - 440 K/uL 187    No images are attached to the encounter.  Ct Chest W Contrast  Result Date: 01/02/2019 CLINICAL DATA:  Restaging metastatic breast cancer EXAM: CT CHEST, ABDOMEN, AND PELVIS WITH CONTRAST TECHNIQUE: Multidetector CT imaging of the chest, abdomen and pelvis was performed following the standard protocol during bolus administration of intravenous contrast. CONTRAST:  115m OMNIPAQUE IOHEXOL 300 MG/ML SOLN, additional oral enteric contrast COMPARISON:  PET-CT, 08/29/2018, 04/05/2018 FINDINGS: CT CHEST FINDINGS Cardiovascular:  Scattered aortic atherosclerosis. Normal heart size. No pericardial effusion. Left chest port catheter. Mediastinum/Nodes: Unchanged right hilar  and mediastinal soft tissue with prominent, matted lymph nodes. New soft tissue nodules or lymph nodes of the azygoesophageal recess, measuring up to 2.5 cm (series 2, image 40). Thyroid gland, trachea, and esophagus demonstrate no significant findings. Lungs/Pleura: There is dense, post treatment consolidation of a right hilar mass, with innumerable bilateral pulmonary nodules, ground-glass opacities, and consolidations. These are generally increased when compared to prior examination, most particularly in the left upper lobe (series 2, image 20). Small bilateral pleural effusions. Musculoskeletal: Postoperative findings of right lumpectomy with skin thickening of the right breast. CT ABDOMEN PELVIS FINDINGS Hepatobiliary: No solid liver abnormality is seen. No gallstones, gallbladder wall thickening, or biliary dilatation. Pancreas: Unremarkable. No pancreatic ductal dilatation or surrounding inflammatory changes. Spleen: Normal in size without significant abnormality. Adrenals/Urinary Tract: Adrenal glands are unremarkable. Kidneys are normal, without renal calculi, solid lesion, or hydronephrosis. Bladder is unremarkable. Stomach/Bowel: Stomach is within normal limits. Appendix appears normal. No evidence of bowel wall thickening, distention, or inflammatory changes. Vascular/Lymphatic: Aortic atherosclerosis. No enlarged abdominal or pelvic lymph nodes. Reproductive: No mass or other abnormality. Other: No abdominal wall hernia or abnormality. No abdominopelvic ascites. Musculoskeletal: No acute or significant osseous findings. IMPRESSION: 1. There is dense, post treatment consolidation of a right hilar mass, with innumerable bilateral pulmonary nodules, ground-glass opacities, and consolidations. These are generally increased when compared to prior examination, most  particularly in the left upper lobe (series 2, image 20). New soft tissue nodules or lymph nodes of the azygoesophageal recess, measuring up to 2.5 cm (series 2, image 40), which may reflect lymphadenopathy or pleural metastatic disease. Findings are generally consistent with worsening metastatic disease, although there may be a superimposed component of nonspecific infection or inflammation, including drug toxicity. 2.  Small bilateral pleural effusions. 3. Postoperative findings of right lumpectomy with skin thickening of the right breast. 4.  No CT evidence of metastatic disease in the abdomen or pelvis. Electronically Signed   By: Eddie Candle M.D.   On: 01/02/2019 13:58   Ct Abdomen Pelvis W Contrast  Result Date: 01/02/2019 CLINICAL DATA:  Restaging metastatic breast cancer EXAM: CT CHEST, ABDOMEN, AND PELVIS WITH CONTRAST TECHNIQUE: Multidetector CT imaging of the chest, abdomen and pelvis was performed following the standard protocol during bolus administration of intravenous contrast. CONTRAST:  167m OMNIPAQUE IOHEXOL 300 MG/ML SOLN, additional oral enteric contrast COMPARISON:  PET-CT, 08/29/2018, 04/05/2018 FINDINGS: CT CHEST FINDINGS Cardiovascular: Scattered aortic atherosclerosis. Normal heart size. No pericardial effusion. Left chest port catheter. Mediastinum/Nodes: Unchanged right hilar and mediastinal soft tissue with prominent, matted lymph nodes. New soft tissue nodules or lymph nodes of the azygoesophageal recess, measuring up to 2.5 cm (series 2, image 40). Thyroid gland, trachea, and esophagus demonstrate no significant findings. Lungs/Pleura: There is dense, post treatment consolidation of a right hilar mass, with innumerable bilateral pulmonary nodules, ground-glass opacities, and consolidations. These are generally increased when compared to prior examination, most particularly in the left upper lobe (series 2, image 20). Small bilateral pleural effusions. Musculoskeletal:  Postoperative findings of right lumpectomy with skin thickening of the right breast. CT ABDOMEN PELVIS FINDINGS Hepatobiliary: No solid liver abnormality is seen. No gallstones, gallbladder wall thickening, or biliary dilatation. Pancreas: Unremarkable. No pancreatic ductal dilatation or surrounding inflammatory changes. Spleen: Normal in size without significant abnormality. Adrenals/Urinary Tract: Adrenal glands are unremarkable. Kidneys are normal, without renal calculi, solid lesion, or hydronephrosis. Bladder is unremarkable. Stomach/Bowel: Stomach is within normal limits. Appendix appears normal. No evidence of bowel wall thickening, distention, or inflammatory  changes. Vascular/Lymphatic: Aortic atherosclerosis. No enlarged abdominal or pelvic lymph nodes. Reproductive: No mass or other abnormality. Other: No abdominal wall hernia or abnormality. No abdominopelvic ascites. Musculoskeletal: No acute or significant osseous findings. IMPRESSION: 1. There is dense, post treatment consolidation of a right hilar mass, with innumerable bilateral pulmonary nodules, ground-glass opacities, and consolidations. These are generally increased when compared to prior examination, most particularly in the left upper lobe (series 2, image 20). New soft tissue nodules or lymph nodes of the azygoesophageal recess, measuring up to 2.5 cm (series 2, image 40), which may reflect lymphadenopathy or pleural metastatic disease. Findings are generally consistent with worsening metastatic disease, although there may be a superimposed component of nonspecific infection or inflammation, including drug toxicity. 2.  Small bilateral pleural effusions. 3. Postoperative findings of right lumpectomy with skin thickening of the right breast. 4.  No CT evidence of metastatic disease in the abdomen or pelvis. Electronically Signed   By: Eddie Candle M.D.   On: 01/02/2019 13:58     Assessment and plan- Patient is a 58 y.o. female  with stage  IV triple negative invasive mammary carcinoma of the right breast T3 N1 M1 with metastases to hilum and subcarinal lymph nodes and most recently disease progression in the lung.    Patient is currently on eribulin here to discuss the results of her CT scan  I have reviewed CT chest abdomen and pelvis images independently and discussed findings with the patient.  Unfortunately CT scan shows new nodes in the azygoesophageal recess.  There is also evidence of new bilateral lung nodules as well as groundglass opacities and consolidation.  Overall this is concerning for disease progression.  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  She has been off steroids for the last 2 to 3 months.  I will empirically start her on prednisone 80 mg.  She will be seeing Dr. Ashby Dawes from pulmonary early next week.  I will discuss this case with her and see if bronchoscopy would be indicated for biopsy to rule out lymphangitic spread of cancer versus possible flareup of pneumonitis.  Patient has gone through multiple lines of chemotherapy fairly quickly and has had disease progression which is overall concerning for poor prognosis.  Given her symptoms of worsening shortness of breath I will plan to start her on carboplatin and gemcitabine soon after bronchoscopy which I will plan to give her 2 weeks on and one-week follow-up if her counts can tolerate it.  Treatment will be given with a palliative intent.  We will also be checking her oxygen levels at rest and on ambulation to see if she will qualify for home oxygen.  I will coordinate her further plan depending on her pulmonary visit early next week.   Visit Diagnosis 1. Acute respiratory failure with hypoxia ([place])      Dr. Randa Evens, MD, MPH [facility] [phone] 01/10/2019 2:45 PM

## 2019-01-10 NOTE — Progress Notes (Signed)
Patient was having SOB especially on exertion. MD asked me to check her sats on Ra and with ambulation.  Ra sat 95% Ambulated around hallway and sat 81% Ambulated with oxygen 2 liter and sats 90%  Patient states that she feels like she can breathe better on oxygen and felt like she can walk more on oxygen.

## 2019-01-12 NOTE — Progress Notes (Addendum)
[facility] Consultation    Virtual Visit via Video Note I connected with patient on 01/13/19 at  8:30 AM EDT by video and verified that I am speaking with the correct person using two identifiers.   I discussed the limitations, risks of performing an evaluation and management service by video and the availability of in person appointments. I also discussed with the patient that there may be a patient responsible charge related to this service.  In light of current covid-19 pandemic, patient also understands that we are trying to protect them by minimizing in office contact if at all possible.  The patient expressed understanding and agreed to proceed. Please see note below for further detail.    The patient was advised to call back or seek an in-person evaluation if the symptoms worsen or if the condition fails to improve as anticipated. I spent 25 minutes of face-to-face time during this visit.   Laverle Hobby, MD    Assessment and Plan:  Metastatic breast cancer with mediastinal lymphadenopathy Acute on chronic pneumonitis.  Cough.  Hypoxic respiratory failure.  - Review of most recent CT chest appears consistent with progressive metastatic breast cancer with metastatic disease to the lungs, there is also evidence of chronic radiation pneumonitis with new acute pneumonitis.  These findings may be secondary to inflammation versus progressive cancer, as well as infection. -We discussed the risks and benefits of bronchoscopy.  A definitive diagnosis would likely require a relatively aggressive procedure with transbronchial biopsies, which would present significant risk in her, particularly in light of her recent hypoxia.  Therefore I recommended treating empirically with antibiotics to treat any infection, and continue with prednisone.  The patient is only been on prednisone for about 3 days, therefore I think it would be prudent to give this a reasonable amount of  time to see if it is going to help with symptoms of pneumonitis. - Patient has already been started empirically on a course of prednisone at 40 mg.  Recommend continuing for at least 2 weeks, then taper by 10 mg weekly until gone.  - If symptoms not improved over the next 2 weeks, will proceed with bronchoscopy.  If symptoms have improved could consider repeat CT chest about 4 weeks from now.  Meds ordered this encounter  Medications   levofloxacin (LEVAQUIN) 500 MG tablet    Sig: Take 1 tablet (500 mg total) by mouth daily.    Dispense:  7 tablet    Refill:  0   sulfamethoxazole-trimethoprim (BACTRIM) 400-80 MG tablet    Sig: Take 1 tablet by mouth 2 (two) times daily for 10 days.    Dispense:  20 tablet    Refill:  0   benzonatate (TESSALON PERLES) 100 MG capsule    Sig: Take 2 capsules (200 mg total) by mouth 3 (three) times daily.    Dispense:  90 capsule    Refill:  2      Date: 01/12/2019  MRN# [id number] Ashley Molina 04-Aug-1960   Ashley Molina is a 58 y.o. old female seen in consultation for chief complaint of: cough.      HPI:  Ashley Molina is a 58 y.o. female, she was last seen here in November of 2018, at that time she underwent ebus bronchoscopy which was consistent with metastatic breast cancer to mediastinal, subcarinal, hilar lymph nodes.  She has a history radiation pneumonitis, she has been on prednisone, initially at 40 mg last fall, then tapered off  in December. She was feeling better at that time with a minor cough.  She started Eribulin on 09/13/18 every 2 weeks, in June she started having a worsening cough, which was progressive. The cough would subside 1 week after the Eribulin, then would get worse with the infusion. The last treatment was 3 weeks, the cough has been getting worse and is now has been having trouble sleeping at night due to cough.  She has been restarted on prednisone at 40 mg three days ago, there has been no difference in the  cough since that time. She is not on bactrim at this time, but she was on it previously. She was prescribed oxygen due to desats with exertion.   Per last visit with oncology: She was seen for second opinion at [facility] and underwent neoadjuvant chemotherapy with carbotaxol followed by dose dense AC. Posttreatment scans showed excellent response to treatment with resolution of hilar adenopathy as well as axillary adenopathy but persistent primary tumor at the right breast. She then went on to get right-sided lumpectomy along with axillary lymph node dissection. Final pathology showed 16 mm residual tumor. 0/12 Ln positive for malignancy. Grade 2. Negative margins. No LVI. ER, PR and her 2 neu negative. She also completed radiation therapy to her chest wall and hilar region at in August 2019. She then completed adjuvant xeloda.Patient also had radiation pneumonitis and was treated with a prolonged course of steroids by radiation oncology at [facility]. Repeat PET CT scan on 08/29/2018 showed new enlarged hypermetabolic right paratracheal and subcarinal and AP window as well as right hilar lymph nodes compatible with nodal metastatic disease. New and enlarging hypermetabolic irregular solid pulmonary nodules consistent with pulmonary metastases.Plan is to proceed with fourth line eribulinstarted in April 2020.  She is referred now from oncology with progressive cough, and desaturations, imaging showed multiple lung nodules which appear to have advanced since previous scan, and changes consistent with pneumonitis.   CT chest 01/02/2019>> imaging personally reviewed, there is bilateral patchy pneumonitis, with bilateral multiple small nodular change, and area of fibrosis.   Last surgery was several years ago >30 yrs ago.   Imaging personally reviewed, PET scan 04/04/17, there is mild right hilar and subcarinal lymphadenopathy which is positive on PET scan. Medication:    Current Outpatient Medications:     Biotin 2500 MCG CAPS, Take 5,000 mcg/day by mouth daily., Disp: , Rfl:    Cholecalciferol (VITAMIN D3) 10000 units TABS, Take by mouth 1 day or 1 dose., Disp: , Rfl:    HYDROcodone-homatropine (HYCODAN) 5-1.5 MG/5ML syrup, Take 5 mLs by mouth every 6 (six) hours as needed for cough., Disp: 240 mL, Rfl: 0   Multiple Vitamins-Minerals (CENTRUM SILVER ADULT 50+) TABS, Take 1 tablet by mouth daily., Disp: , Rfl:    omega-3 acid ethyl esters (LOVAZA) 1 g capsule, Take by mouth 1 day or 1 dose., Disp: , Rfl:    pantoprazole (PROTONIX) 20 MG tablet, Take 1 tablet (20 mg total) by mouth daily., Disp: 10 tablet, Rfl: 0   predniSONE (DELTASONE) 20 MG tablet, Take 4 tablets (80 mg total) by mouth daily with breakfast., Disp: 40 tablet, Rfl: 0 No current facility-administered medications for this visit.   Facility-Administered Medications Ordered in Other Visits:    Tbo-Filgrastim (GRANIX) injection 480 mcg, 480 mcg, Subcutaneous, Daily, Sindy Guadeloupe, MD, 480 mcg at 05/19/17 1412   Allergies:  Penicillins  Review of Systems: Gen:  Denies  fever, sweats, chills HEENT: Denies blurred vision, double  vision. bleeds, sore throat Cvc:  No dizziness, chest pain. Resp:   Denies cough or sputum production, shortness of breath Gi: Denies swallowing difficulty, stomach pain. Gu:  Denies bladder incontinence, burning urine Ext:   No Joint pain, stiffness. Skin: No skin rash,  hives  Endoc:  No polyuria, polydipsia. Psych: No depression, insomnia. Other:  All other systems were reviewed with the patient and were negative other that what is mentioned in the HPI.   Physical Examination:   VS: LMP 04/04/2012 (Approximate)   General Appearance: No distress  Neuro:without focal findings,  speech normal,  HEENT: PERRLA, EOM intact.   Pulmonary: normal breath sounds, No wheezing.  CardiovascularNormal S1,S2.  No m/r/g.   Abdomen: Benign, Soft, non-tender. Renal:  No costovertebral tenderness  GU:  No  performed at this time. Endoc: No evident thyromegaly, no signs of acromegaly. Skin:   warm, no rashes, no ecchymosis  Extremities: normal, no cyanosis, clubbing.  Other findings:    LABORATORY PANEL:   CBC No results for input(s): WBC, HGB, HCT, PLT in the last 168 hours. ------------------------------------------------------------------------------------------------------------------  Chemistries  No results for input(s): NA, K, CL, CO2, GLUCOSE, BUN, CREATININE, CALCIUM, MG, AST, ALT, ALKPHOS, BILITOT in the last 168 hours.  Invalid input(s): GFRCGP ------------------------------------------------------------------------------------------------------------------  Cardiac Enzymes No results for input(s): TROPONINI in the last 168 hours. ------------------------------------------------------------  RADIOLOGY:  No results found.     Thank  you for the consultation and for allowing [facility] to assist in the care of your patient. Our recommendations are noted above.  Please contact us if we can be of further service.   Marda Stalker, MD.  Board Certified in Internal Medicine, Pulmonary Medicine, [place], and Sleep Medicine.   Pulmonary and Critical Care Office Number: [phone]  Patricia Pesa, M.D.  Merton Border, M.D  01/12/2019

## 2019-01-13 ENCOUNTER — Encounter: Payer: Self-pay | Admitting: Oncology

## 2019-01-13 ENCOUNTER — Ambulatory Visit (INDEPENDENT_AMBULATORY_CARE_PROVIDER_SITE_OTHER): Payer: Managed Care, Other (non HMO) | Admitting: Internal Medicine

## 2019-01-13 DIAGNOSIS — R590 Localized enlarged lymph nodes: Secondary | ICD-10-CM | POA: Diagnosis not present

## 2019-01-13 MED ORDER — LEVOFLOXACIN 500 MG PO TABS: 500.0000 mg | ORAL_TABLET | Freq: Every day | ORAL | 0 refills | Status: DC

## 2019-01-13 MED ORDER — SULFAMETHOXAZOLE-TRIMETHOPRIM 400-80 MG PO TABS: 1.0000 | ORAL_TABLET | Freq: Two times a day (BID) | ORAL | 0 refills | Status: DC

## 2019-01-13 MED ORDER — BENZONATATE 100 MG PO CAPS: 200.0000 mg | ORAL_CAPSULE | Freq: Three times a day (TID) | ORAL | 2 refills | Status: DC

## 2019-01-13 NOTE — Patient Instructions (Addendum)
Continue prednisone 40 mg daily.  Will start 2 antibiotics. Call us back in 1 week with progress to see if your symptoms are improving.  If not we will consider proceeding with bronchoscopy. I will prescribe you Tessalon for the cough, take this 2 tablets 3 times daily.  Take the codeine cough syrup that Dr. Janese Banks prescribed for you at night to help with sleep.

## 2019-01-14 ENCOUNTER — Other Ambulatory Visit: Payer: Self-pay | Admitting: Oncology

## 2019-01-14 ENCOUNTER — Other Ambulatory Visit: Payer: Self-pay | Admitting: *Deleted

## 2019-01-14 DIAGNOSIS — C50411 Malignant neoplasm of upper-outer quadrant of right female breast: Secondary | ICD-10-CM

## 2019-01-14 DIAGNOSIS — Z171 Estrogen receptor negative status [ER-]: Secondary | ICD-10-CM

## 2019-01-14 NOTE — Progress Notes (Signed)
DISCONTINUE ON PATHWAY REGIMEN - Breast     A cycle is every 21 days:     Eribulin mesylate   **Always confirm dose/schedule in your pharmacy ordering system**  REASON: Disease Progression PRIOR TREATMENT: [id number]: Eribulin 1.4 mg/m2 D1, 8 q21 Days Until Progression or Unacceptable Toxicity TREATMENT RESPONSE: Progressive Disease (PD)  START ON PATHWAY REGIMEN - Breast     A cycle is every 28 days (3 weeks on and 1 week off):     Gemcitabine   **Always confirm dose/schedule in your pharmacy ordering system**  Patient Characteristics: Distant Metastases or Locoregional Recurrent Disease - Unresected or Locally Advanced Unresectable Disease Progressing after Neoadjuvant and Local Therapies, HER2 Negative/Unknown/Equivocal, ER Negative/Unknown, Chemotherapy, Third Line and Beyond, Prior  or Contraindicated Anthracycline and Prior or Contraindicated Eribulin Therapeutic Status: Distant Metastases BRCA Mutation Status: Absent ER Status: Negative (-) HER2 Status: Negative (-) PR Status: Negative (-) Line of Therapy: Third Line and Beyond Intent of Therapy: Non-Curative / Palliative Intent, Discussed with Patient

## 2019-01-14 NOTE — Progress Notes (Signed)
Error

## 2019-01-15 ENCOUNTER — Encounter: Payer: Self-pay | Admitting: *Deleted

## 2019-01-15 ENCOUNTER — Other Ambulatory Visit: Payer: Self-pay | Admitting: *Deleted

## 2019-01-15 MED ORDER — PREDNISONE 20 MG PO TABS: 20.0000 mg | ORAL_TABLET | ORAL | 0 refills | Status: DC

## 2019-01-16 ENCOUNTER — Ambulatory Visit: Payer: Managed Care, Other (non HMO) | Admitting: Internal Medicine

## 2019-01-17 ENCOUNTER — Other Ambulatory Visit: Payer: Self-pay | Admitting: Oncology

## 2019-01-17 ENCOUNTER — Other Ambulatory Visit: Payer: Self-pay | Admitting: *Deleted

## 2019-01-17 MED ORDER — PANTOPRAZOLE SODIUM 20 MG PO TBEC: 20.0000 mg | DELAYED_RELEASE_TABLET | Freq: Every day | ORAL | 0 refills | Status: DC

## 2019-01-20 ENCOUNTER — Inpatient Hospital Stay: Payer: Managed Care, Other (non HMO)

## 2019-01-20 ENCOUNTER — Other Ambulatory Visit: Payer: Self-pay

## 2019-01-20 ENCOUNTER — Inpatient Hospital Stay: Payer: Managed Care, Other (non HMO) | Attending: Oncology | Admitting: Oncology

## 2019-01-20 ENCOUNTER — Encounter: Payer: Self-pay | Admitting: Oncology

## 2019-01-20 VITALS — BP 104/70 | HR 75 | Temp 97.6°F | Resp 16 | Ht 63.0 in | Wt 191.3 lb

## 2019-01-20 DIAGNOSIS — Z808 Family history of malignant neoplasm of other organs or systems: Secondary | ICD-10-CM | POA: Insufficient documentation

## 2019-01-20 DIAGNOSIS — R5383 Other fatigue: Secondary | ICD-10-CM | POA: Insufficient documentation

## 2019-01-20 DIAGNOSIS — Z5111 Encounter for antineoplastic chemotherapy: Secondary | ICD-10-CM

## 2019-01-20 DIAGNOSIS — Z8042 Family history of malignant neoplasm of prostate: Secondary | ICD-10-CM | POA: Insufficient documentation

## 2019-01-20 DIAGNOSIS — C50411 Malignant neoplasm of upper-outer quadrant of right female breast: Secondary | ICD-10-CM | POA: Insufficient documentation

## 2019-01-20 DIAGNOSIS — Z9221 Personal history of antineoplastic chemotherapy: Secondary | ICD-10-CM | POA: Insufficient documentation

## 2019-01-20 DIAGNOSIS — C771 Secondary and unspecified malignant neoplasm of intrathoracic lymph nodes: Secondary | ICD-10-CM | POA: Insufficient documentation

## 2019-01-20 DIAGNOSIS — Z7189 Other specified counseling: Secondary | ICD-10-CM | POA: Diagnosis not present

## 2019-01-20 DIAGNOSIS — Z171 Estrogen receptor negative status [ER-]: Secondary | ICD-10-CM

## 2019-01-20 DIAGNOSIS — R0602 Shortness of breath: Secondary | ICD-10-CM | POA: Insufficient documentation

## 2019-01-20 DIAGNOSIS — Z803 Family history of malignant neoplasm of breast: Secondary | ICD-10-CM | POA: Insufficient documentation

## 2019-01-20 DIAGNOSIS — J189 Pneumonia, unspecified organism: Secondary | ICD-10-CM | POA: Diagnosis not present

## 2019-01-20 DIAGNOSIS — R05 Cough: Secondary | ICD-10-CM | POA: Insufficient documentation

## 2019-01-20 DIAGNOSIS — Z923 Personal history of irradiation: Secondary | ICD-10-CM | POA: Insufficient documentation

## 2019-01-20 LAB — CBC WITH DIFFERENTIAL/PLATELET
Abs Immature Granulocytes: 0.04 10*3/uL (ref 0.00–0.07)
Basophils Absolute: 0 10*3/uL (ref 0.0–0.1)
Basophils Relative: 0 %
Eosinophils Absolute: 0.1 10*3/uL (ref 0.0–0.5)
Eosinophils Relative: 1 %
HCT: 38.7 % (ref 36.0–46.0)
Hemoglobin: 12.4 g/dL (ref 12.0–15.0)
Immature Granulocytes: 0 %
Lymphocytes Relative: 6 %
Lymphs Abs: 0.6 10*3/uL — ABNORMAL LOW (ref 0.7–4.0)
MCH: 27 pg (ref 26.0–34.0)
MCHC: 32 g/dL (ref 30.0–36.0)
MCV: 84.1 fL (ref 80.0–100.0)
Monocytes Absolute: 0.8 10*3/uL (ref 0.1–1.0)
Monocytes Relative: 8 %
Neutro Abs: 8.5 10*3/uL — ABNORMAL HIGH (ref 1.7–7.7)
Neutrophils Relative %: 85 %
Platelets: 207 10*3/uL (ref 150–400)
RBC: 4.6 MIL/uL (ref 3.87–5.11)
RDW: 17 % — ABNORMAL HIGH (ref 11.5–15.5)
WBC: 10 10*3/uL (ref 4.0–10.5)
nRBC: 0 % (ref 0.0–0.2)

## 2019-01-20 LAB — COMPREHENSIVE METABOLIC PANEL
ALT: 19 U/L (ref 0–44)
AST: 21 U/L (ref 15–41)
Albumin: 3.6 g/dL (ref 3.5–5.0)
Alkaline Phosphatase: 93 U/L (ref 38–126)
Anion gap: 9 (ref 5–15)
BUN: 19 mg/dL (ref 6–20)
CO2: 25 mmol/L (ref 22–32)
Calcium: 9.7 mg/dL (ref 8.9–10.3)
Chloride: 104 mmol/L (ref 98–111)
Creatinine, Ser: 0.84 mg/dL (ref 0.44–1.00)
GFR calc Af Amer: >60 mL/min (ref >60)
GFR calc non Af Amer: >60 mL/min (ref >60)
Glucose, Bld: 110 mg/dL — ABNORMAL HIGH (ref 70–99)
Potassium: 3.8 mmol/L (ref 3.5–5.1)
Sodium: 138 mmol/L (ref 135–145)
Total Bilirubin: 0.7 mg/dL (ref 0.3–1.2)
Total Protein: 6.3 g/dL — ABNORMAL LOW (ref 6.5–8.1)

## 2019-01-20 MED ORDER — SODIUM CHLORIDE 0.9 % IV SOLN
255.0000 mg | Freq: Once | INTRAVENOUS | Status: AC
  Administered 2019-01-20: 260 mg via INTRAVENOUS
  Filled 2019-01-20: qty 26

## 2019-01-20 MED ORDER — PALONOSETRON HCL INJECTION 0.25 MG/5ML
0.2500 mg | Freq: Once | INTRAVENOUS | Status: AC
  Administered 2019-01-20: 13:00:00 0.25 mg via INTRAVENOUS
  Filled 2019-01-20: qty 5

## 2019-01-20 MED ORDER — SODIUM CHLORIDE 0.9 % IV SOLN
Freq: Once | INTRAVENOUS | Status: AC
  Administered 2019-01-20: 11:00:00 via INTRAVENOUS
  Filled 2019-01-20: qty 250

## 2019-01-20 MED ORDER — PROCHLORPERAZINE MALEATE 10 MG PO TABS
10.0000 mg | ORAL_TABLET | Freq: Once | ORAL | Status: AC
  Administered 2019-01-20: 10 mg via ORAL
  Filled 2019-01-20: qty 1

## 2019-01-20 MED ORDER — SODIUM CHLORIDE 0.9 % IV SOLN
800.0000 mg/m2 | Freq: Once | INTRAVENOUS | Status: AC
  Administered 2019-01-20: 1596 mg via INTRAVENOUS
  Filled 2019-01-20: qty 26.3

## 2019-01-20 MED ORDER — HEPARIN SOD (PORK) LOCK FLUSH 100 UNIT/ML IV SOLN
500.0000 [IU] | Freq: Once | INTRAVENOUS | Status: AC | PRN
  Administered 2019-01-20: 500 [IU]
  Filled 2019-01-20: qty 5

## 2019-01-20 NOTE — Progress Notes (Signed)
Feeling better on ATB, steroids, and oxygen.

## 2019-01-20 NOTE — Progress Notes (Signed)
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 Her only site of metastatic disease was biopsy-proven hilar adenopathy. She was seen for second opinion at [facility] and underwent neoadjuvant chemotherapy with carbotaxol followed by dose dense AC. Posttreatment scans showed excellent response to treatment with resolution of hilar adenopathy as well as axillary adenopathy but persistent primary tumor at the right breast. She then went on to get right-sided lumpectomy along with axillary lymph node dissection. Final pathology showed 16 mm residual tumor. 0/12 Ln positive for malignancy. Grade 2. Negative margins. No LVI. ER, PR and her 2 neu negative. She also completed radiation therapy to her chest wall and hilar region at in August 2019. She then completed adjuvant xeloda.Patient also had radiation pneumonitis and was treated with a prolonged course of steroids by radiation oncology at [facility].  Patient has had baseline foundation 1 testing done which did not show any evidence of actionable mutations. PDL 1 was 0%. She also had strata testing done at [facility] which did not show any actionable mutations.  Repeat PET CT scan on  08/29/2018 showed new enlarged hypermetabolic right paratracheal and subcarinal and AP window as well as right hilar lymph nodes compatible with nodal metastatic disease. New and enlarging hypermetabolic irregular solid pulmonary nodules consistent with pulmonary metastases.Plan is to proceed with fourth line eribulinstarted in April 2020.   Interval history-patient finished her course of Levaquin yesterday.  She is down to 60 mg of prednisone daily and reports that her breathing is much improved at this time.  She uses portable oxygen when she carries out exertional activities such as vacuuming around the house but does not require it at baseline.  Her cough has improved as well.  ECOG PS- 1 Pain scale- 0   Review of systems- Review of Systems  Constitutional: Positive for malaise/fatigue. Negative for chills, fever and weight loss.  HENT: Negative for congestion, ear discharge and nosebleeds.   Eyes: Negative for blurred vision.  Respiratory: Positive for cough and shortness of breath. Negative for hemoptysis, sputum production and wheezing.   Cardiovascular: Negative for chest pain, palpitations, orthopnea and claudication.  Gastrointestinal: Negative for abdominal pain, blood in stool, constipation, diarrhea, heartburn, melena, nausea and vomiting.  Genitourinary: Negative for dysuria, flank pain, frequency, hematuria and urgency.  Musculoskeletal: Negative for back pain, joint pain and myalgias.  Skin: Negative for rash.  Neurological: Negative for dizziness, tingling, focal weakness, seizures, weakness and headaches.  Endo/Heme/Allergies: Does not bruise/bleed easily.  Psychiatric/Behavioral: Negative for depression and suicidal ideas. The patient does not have insomnia.       Allergies  Allergen Reactions   Penicillins Rash     Past Medical History:  Diagnosis Date   Anemia    Breast cancer ([place])    Breast cancer, right ([place]) 04/2017   Hx Lumpectomy, Chemo +  Rad  tx's.   Breast cyst, right    aspirated by Dr. Bary Castilla   Hyperlipidemia    Personal history of chemotherapy    Pre-diabetes      Past Surgical History:  Procedure Laterality Date   AXILLARY LYMPH NODE BIOPSY Right 04/09/2017   Procedure: AXILLARY LYMPH NODE BIOPSY;  Surgeon: Robert Bellow, MD;  Location: [facility] ORS;  Service: General;  Laterality: Right;   BREAST BIOPSY Right 03/27/2017   US guided breast mass - invasive mammary carcinoma   BREAST BIOPSY Right 03/27/2017   Lymph node - metastatic carcinoma   BREAST BIOPSY Right 04/09/2017   Lymph node    BREAST CYST ASPIRATION Right 11/2009   Dr. Bary Castilla did FNA   BREAST EXCISIONAL BIOPSY Right 09/28/2017   lumpectomy and 12 lympnode rad neo adj chemo   COLONOSCOPY  2011   DILATION AND CURETTAGE OF UTERUS     X3   ENDOBRONCHIAL ULTRASOUND N/A 04/09/2017   Procedure: ENDOBRONCHIAL ULTRASOUND;  Surgeon: Laverle Hobby, MD;  Location: [facility] ORS;  Service: Pulmonary;  Laterality: N/A;   ENDOMETRIAL ABLATION     ENDOMETRIAL BIOPSY  09/2009   PORTACATH PLACEMENT Left 04/09/2017   Procedure: INSERTION PORT-A-CATH;  Surgeon: Robert Bellow, MD;  Location: [facility] ORS;  Service: General;  Laterality: Left;    Social History   Socioeconomic History   Marital status: Married    Spouse name: Not on file   Number of children: Not on file   Years of education: Not on file   Highest education level: Not on file  Occupational History   Not on file  Social Needs   Financial resource strain: Not on file   Food insecurity    Worry: Not on file    Inability: Not on file   Transportation needs    Medical: Not on file    Non-medical: Not on file  Tobacco Use   Smoking status: Never Smoker   Smokeless tobacco: Never Used  Substance and Sexual Activity   Alcohol use: Not Currently   Drug use: No   Sexual activity: Not Currently  Lifestyle   Physical activity    Days per week: Not on file     Minutes per session: Not on file   Stress: Not on file  Relationships   Social connections    Talks on phone: Not on file    Gets together: Not on file    Attends religious service: Not on file    Active member of club or organization: Not on file    Attends meetings of clubs or organizations: Not on file    Relationship status: Not on file   Intimate partner violence    Fear of current or ex partner: Not on file    Emotionally abused: Not on file    Physically abused: Not on file    Forced sexual activity: Not on file  Other Topics Concern   Not on file  Social History Narrative   Not on file    Family History  Problem Relation Age of Onset   Melanoma Maternal Grandmother 98       currently 80   Brain cancer Maternal Grandfather 64       unk. type; deceased in 63s   Melanoma Other 59       mat grandmother's father   Melanoma Father 31       on head; currently 59   Prostate cancer Maternal Uncle  3 maternal uncles; dx in 35s   Breast cancer Other        mat grandfather's sister; dx 36s     Current Outpatient Medications:    benzonatate (TESSALON PERLES) 100 MG capsule, Take 2 capsules (200 mg total) by mouth 3 (three) times daily., Disp: 90 capsule, Rfl: 2   Biotin 2500 MCG CAPS, Take 5,000 mcg/day by mouth daily., Disp: , Rfl:    Cholecalciferol (VITAMIN D3) 10000 units TABS, Take by mouth 1 day or 1 dose., Disp: , Rfl:    HYDROcodone-homatropine (HYCODAN) 5-1.5 MG/5ML syrup, Take 5 mLs by mouth every 6 (six) hours as needed for cough., Disp: 240 mL, Rfl: 0   Multiple Vitamins-Minerals (CENTRUM SILVER ADULT 50+) TABS, Take 1 tablet by mouth daily., Disp: , Rfl:    omega-3 acid ethyl esters (LOVAZA) 1 g capsule, Take by mouth 1 day or 1 dose., Disp: , Rfl:    pantoprazole (PROTONIX) 20 MG tablet, Take 1 tablet (20 mg total) by mouth daily., Disp: 30 tablet, Rfl: 0   predniSONE (DELTASONE) 20 MG tablet, Take 1 tablet (20 mg total) by mouth as  directed. 60 mg x 7 days, 50 mg x 7 days, 40 mg x 7 days, 30 mg x 7 days, 20 mg x 7 days, 10 mg x 7 days. Each day take with food, Disp: 74 tablet, Rfl: 0   sulfamethoxazole-trimethoprim (BACTRIM) 400-80 MG tablet, Take 1 tablet by mouth 2 (two) times daily for 10 days., Disp: 20 tablet, Rfl: 0 No current facility-administered medications for this visit.   Facility-Administered Medications Ordered in Other Visits:    Tbo-Filgrastim (GRANIX) injection 480 mcg, 480 mcg, Subcutaneous, Daily, Sindy Guadeloupe, MD, 480 mcg at 05/19/17 1412  Physical exam:  Vitals:   01/20/19 1038  BP: 104/70  Pulse: 75  Resp: 16  Temp: 97.6 F (36.4 C)  TempSrc: Tympanic  Weight: 191 lb 4.8 oz (86.8 kg)  Height: 5' 3"  (1.6 m)   Physical Exam Constitutional:      General: She is not in acute distress. HENT:     Head: Normocephalic and atraumatic.  Eyes:     Pupils: Pupils are equal, round, and reactive to light.  Neck:     Musculoskeletal: Normal range of motion.  Cardiovascular:     Rate and Rhythm: Normal rate and regular rhythm.     Heart sounds: Normal heart sounds.  Pulmonary:     Effort: Pulmonary effort is normal.     Breath sounds: Normal breath sounds.  Abdominal:     General: Bowel sounds are normal.     Palpations: Abdomen is soft.  Skin:    General: Skin is warm and dry.  Neurological:     Mental Status: She is alert and oriented to person, place, and time.      CMP Latest Ref Rng & Units 01/20/2019  Glucose 70 - 99 mg/dL 110(H)  BUN 6 - 20 mg/dL 19  Creatinine 0.44 - 1.00 mg/dL 0.84  Sodium 135 - 145 mmol/L 138  Potassium 3.5 - 5.1 mmol/L 3.8  Chloride 98 - 111 mmol/L 104  CO2 22 - 32 mmol/L 25  Calcium 8.9 - 10.3 mg/dL 9.7  Total Protein 6.5 - 8.1 g/dL 6.3(L)  Total Bilirubin 0.3 - 1.2 mg/dL 0.7  Alkaline Phos 38 - 126 U/L 93  AST 15 - 41 U/L 21  ALT 0 - 44 U/L 19   CBC Latest Ref Rng & Units 01/20/2019  WBC 4.0 -  10.5 K/uL 10.0  Hemoglobin 12.0 - 15.0 g/dL 12.4    Hematocrit 36.0 - 46.0 % 38.7  Platelets 150 - 400 K/uL 207    No images are attached to the encounter.  Ct Chest W Contrast  Result Date: 01/02/2019 CLINICAL DATA:  Restaging metastatic breast cancer EXAM: CT CHEST, ABDOMEN, AND PELVIS WITH CONTRAST TECHNIQUE: Multidetector CT imaging of the chest, abdomen and pelvis was performed following the standard protocol during bolus administration of intravenous contrast. CONTRAST:  159m OMNIPAQUE IOHEXOL 300 MG/ML SOLN, additional oral enteric contrast COMPARISON:  PET-CT, 08/29/2018, 04/05/2018 FINDINGS: CT CHEST FINDINGS Cardiovascular: Scattered aortic atherosclerosis. Normal heart size. No pericardial effusion. Left chest port catheter. Mediastinum/Nodes: Unchanged right hilar and mediastinal soft tissue with prominent, matted lymph nodes. New soft tissue nodules or lymph nodes of the azygoesophageal recess, measuring up to 2.5 cm (series 2, image 40). Thyroid gland, trachea, and esophagus demonstrate no significant findings. Lungs/Pleura: There is dense, post treatment consolidation of a right hilar mass, with innumerable bilateral pulmonary nodules, ground-glass opacities, and consolidations. These are generally increased when compared to prior examination, most particularly in the left upper lobe (series 2, image 20). Small bilateral pleural effusions. Musculoskeletal: Postoperative findings of right lumpectomy with skin thickening of the right breast. CT ABDOMEN PELVIS FINDINGS Hepatobiliary: No solid liver abnormality is seen. No gallstones, gallbladder wall thickening, or biliary dilatation. Pancreas: Unremarkable. No pancreatic ductal dilatation or surrounding inflammatory changes. Spleen: Normal in size without significant abnormality. Adrenals/Urinary Tract: Adrenal glands are unremarkable. Kidneys are normal, without renal calculi, solid lesion, or hydronephrosis. Bladder is unremarkable. Stomach/Bowel: Stomach is within normal limits. Appendix  appears normal. No evidence of bowel wall thickening, distention, or inflammatory changes. Vascular/Lymphatic: Aortic atherosclerosis. No enlarged abdominal or pelvic lymph nodes. Reproductive: No mass or other abnormality. Other: No abdominal wall hernia or abnormality. No abdominopelvic ascites. Musculoskeletal: No acute or significant osseous findings. IMPRESSION: 1. There is dense, post treatment consolidation of a right hilar mass, with innumerable bilateral pulmonary nodules, ground-glass opacities, and consolidations. These are generally increased when compared to prior examination, most particularly in the left upper lobe (series 2, image 20). New soft tissue nodules or lymph nodes of the azygoesophageal recess, measuring up to 2.5 cm (series 2, image 40), which may reflect lymphadenopathy or pleural metastatic disease. Findings are generally consistent with worsening metastatic disease, although there may be a superimposed component of nonspecific infection or inflammation, including drug toxicity. 2.  Small bilateral pleural effusions. 3. Postoperative findings of right lumpectomy with skin thickening of the right breast. 4.  No CT evidence of metastatic disease in the abdomen or pelvis. Electronically Signed   By: AEddie CandleM.D.   On: 01/02/2019 13:58   Ct Abdomen Pelvis W Contrast  Result Date: 01/02/2019 CLINICAL DATA:  Restaging metastatic breast cancer EXAM: CT CHEST, ABDOMEN, AND PELVIS WITH CONTRAST TECHNIQUE: Multidetector CT imaging of the chest, abdomen and pelvis was performed following the standard protocol during bolus administration of intravenous contrast. CONTRAST:  1020mOMNIPAQUE IOHEXOL 300 MG/ML SOLN, additional oral enteric contrast COMPARISON:  PET-CT, 08/29/2018, 04/05/2018 FINDINGS: CT CHEST FINDINGS Cardiovascular: Scattered aortic atherosclerosis. Normal heart size. No pericardial effusion. Left chest port catheter. Mediastinum/Nodes: Unchanged right hilar and mediastinal  soft tissue with prominent, matted lymph nodes. New soft tissue nodules or lymph nodes of the azygoesophageal recess, measuring up to 2.5 cm (series 2, image 40). Thyroid gland, trachea, and esophagus demonstrate no significant findings. Lungs/Pleura: There is dense, post treatment consolidation of a right  hilar mass, with innumerable bilateral pulmonary nodules, ground-glass opacities, and consolidations. These are generally increased when compared to prior examination, most particularly in the left upper lobe (series 2, image 20). Small bilateral pleural effusions. Musculoskeletal: Postoperative findings of right lumpectomy with skin thickening of the right breast. CT ABDOMEN PELVIS FINDINGS Hepatobiliary: No solid liver abnormality is seen. No gallstones, gallbladder wall thickening, or biliary dilatation. Pancreas: Unremarkable. No pancreatic ductal dilatation or surrounding inflammatory changes. Spleen: Normal in size without significant abnormality. Adrenals/Urinary Tract: Adrenal glands are unremarkable. Kidneys are normal, without renal calculi, solid lesion, or hydronephrosis. Bladder is unremarkable. Stomach/Bowel: Stomach is within normal limits. Appendix appears normal. No evidence of bowel wall thickening, distention, or inflammatory changes. Vascular/Lymphatic: Aortic atherosclerosis. No enlarged abdominal or pelvic lymph nodes. Reproductive: No mass or other abnormality. Other: No abdominal wall hernia or abnormality. No abdominopelvic ascites. Musculoskeletal: No acute or significant osseous findings. IMPRESSION: 1. There is dense, post treatment consolidation of a right hilar mass, with innumerable bilateral pulmonary nodules, ground-glass opacities, and consolidations. These are generally increased when compared to prior examination, most particularly in the left upper lobe (series 2, image 20). New soft tissue nodules or lymph nodes of the azygoesophageal recess, measuring up to 2.5 cm (series 2,  image 40), which may reflect lymphadenopathy or pleural metastatic disease. Findings are generally consistent with worsening metastatic disease, although there may be a superimposed component of nonspecific infection or inflammation, including drug toxicity. 2.  Small bilateral pleural effusions. 3. Postoperative findings of right lumpectomy with skin thickening of the right breast. 4.  No CT evidence of metastatic disease in the abdomen or pelvis. Electronically Signed   By: Eddie Candle M.D.   On: 01/02/2019 13:58     Assessment and plan- Patient is a 58 y.o. female with stage IV triple negative invasive mammary carcinoma of the right breast T3 N1 M1 with metastases to hilum and subcarinal lymph nodes and most recently disease progression in the lung.   She is here for on treatment assessment prior to cycle 1 day 1 of carboplatin and gemcitabine  We had discussed patient's case tumor board last week and overall findings are consistent with disease progression but there is also a possible element of pneumonitis superimposed.  I therefore started the patient on high-dose steroids and she is now down to 60 mg of prednisone and has been on steroids for the last 2 weeks and clinically feels improved.  Her cough has reduced and her shortness of breath has improved as well.  She will continue steroid taper for the next 6 weeks.  She will also remain on PCP prophylaxis with Bactrim 3 times a week.  Although there is a component of possible pneumonitis she also had progression of her lymphadenopathy as well as lung nodules concerning for disease progression.  I would therefore like to switch her to next line of chemotherapy with palliative carboplatin given at AUC 2 and gemcitabine at 800 mg per metered square 2 weeks on and one-week off until progression or toxicity.  Discussed risks and benefits of carboplatin and gemcitabine including all but not limited to nausea, vomiting, low blood counts, fatigue, risk of  infections and hospitalization.  Treatment is being given with a palliative intent.  Patient understands and agrees to proceed as planned.  Patient does develop neutropenia with chemotherapy in the past as well and I would like to give her on pro Neulasta with day 8 of treatment.  Insurance approval is currently pending for  ongoing Neulasta.  Patient will proceed with cycle 1 day 1 of carboplatin and gemcitabine today and will proceed for day 8 of treatment next week.  I will see her back in 3 weeks time for cycle 2-day 1 of carboplatin and gemcitabine   Visit Diagnosis 1. Malignant neoplasm of upper-outer quadrant of right breast in female, estrogen receptor negative ([place])   2. Encounter for antineoplastic chemotherapy   3. Pneumonitis   4. Goals of care, counseling/discussion      Dr. Randa Evens, MD, MPH [facility] [phone] 01/20/2019 11:18 AM

## 2019-01-21 NOTE — Telephone Encounter (Signed)
DR please advise. Thanks 

## 2019-01-22 ENCOUNTER — Other Ambulatory Visit: Payer: Self-pay | Admitting: *Deleted

## 2019-01-22 ENCOUNTER — Encounter: Payer: Self-pay | Admitting: Oncology

## 2019-01-22 MED ORDER — SULFAMETHOXAZOLE-TRIMETHOPRIM 400-80 MG PO TABS: 1.0000 | ORAL_TABLET | ORAL | 0 refills | Status: AC

## 2019-01-29 ENCOUNTER — Other Ambulatory Visit: Payer: Self-pay

## 2019-01-29 ENCOUNTER — Encounter: Payer: Self-pay | Admitting: Oncology

## 2019-01-30 ENCOUNTER — Other Ambulatory Visit: Payer: Self-pay

## 2019-01-30 ENCOUNTER — Inpatient Hospital Stay: Payer: Managed Care, Other (non HMO)

## 2019-01-30 VITALS — BP 113/78 | HR 87 | Resp 19

## 2019-01-30 DIAGNOSIS — C50411 Malignant neoplasm of upper-outer quadrant of right female breast: Secondary | ICD-10-CM | POA: Diagnosis not present

## 2019-01-30 DIAGNOSIS — Z171 Estrogen receptor negative status [ER-]: Secondary | ICD-10-CM

## 2019-01-30 MED ORDER — SODIUM CHLORIDE 0.9 % IV SOLN
Freq: Once | INTRAVENOUS | Status: AC
  Administered 2019-01-30: 14:00:00 via INTRAVENOUS
  Filled 2019-01-30: qty 250

## 2019-01-30 MED ORDER — PALONOSETRON HCL INJECTION 0.25 MG/5ML
0.2500 mg | Freq: Once | INTRAVENOUS | Status: AC
  Administered 2019-01-30: 0.25 mg via INTRAVENOUS
  Filled 2019-01-30: qty 5

## 2019-01-30 MED ORDER — PEGFILGRASTIM 6 MG/0.6ML ~~LOC~~ PSKT
6.0000 mg | PREFILLED_SYRINGE | Freq: Once | SUBCUTANEOUS | Status: AC
  Administered 2019-01-30: 6 mg via SUBCUTANEOUS
  Filled 2019-01-30: qty 0.6

## 2019-01-30 MED ORDER — SODIUM CHLORIDE 0.9 % IV SOLN
1600.0000 mg | Freq: Once | INTRAVENOUS | Status: AC
  Administered 2019-01-30: 1600 mg via INTRAVENOUS
  Filled 2019-01-30: qty 26.3

## 2019-01-30 MED ORDER — PROCHLORPERAZINE MALEATE 10 MG PO TABS
10.0000 mg | ORAL_TABLET | Freq: Once | ORAL | Status: AC
  Administered 2019-01-30: 14:00:00 10 mg via ORAL
  Filled 2019-01-30: qty 1

## 2019-01-30 MED ORDER — SODIUM CHLORIDE 0.9 % IV SOLN
255.0000 mg | Freq: Once | INTRAVENOUS | Status: AC
  Administered 2019-01-30: 260 mg via INTRAVENOUS
  Filled 2019-01-30: qty 26

## 2019-01-30 MED ORDER — HEPARIN SOD (PORK) LOCK FLUSH 100 UNIT/ML IV SOLN
500.0000 [IU] | Freq: Once | INTRAVENOUS | Status: AC | PRN
  Administered 2019-01-30: 500 [IU]
  Filled 2019-01-30: qty 5

## 2019-02-11 NOTE — Progress Notes (Signed)
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 Cough, improved..  Hypoxic respiratory failure, improved.  - Patient appears to be recovering well from her recent episode of pneumonitis.  Etiology was uncertain but could be secondary to eribulin? -We will continue to monitor progress as she weans from prednisone, continue Bactrim 3 times weekly as long as the patient is on 20 mg or more prednisone daily. -We discussed increasing her activity level to try to increase her stamina, which will help with her dyspnea. -Dr. Janese Banks will be checking a CT scan after her current chemotherapy regimen is completed, in about 6 weeks.  We will see her back after that to review.  Return in about 8 weeks (around 04/10/2019) for after CT chest.   Date: 02/11/2019  MRN# [id number] Ashley Molina 11/14/1960   Ashley Molina is a 58 y.o. old female seen in consultation for chief complaint of: cough.      HPI:  Ashley Molina is a 58 y.o. female with metastatic breast cancer to mediastinal, subcarinal, hilar lymph nodes.  She has a history radiation pneumonitis, she has been on prednisone, initially at 40 mg last fall, then tapered off in December. She was feeling better at that time with a minor cough.  She started Eribulin on 09/13/18 every 2 weeks, in June she started having a worsening cough, which was progressive. The cough would subside 1 week after the Eribulin, then would get worse with the infusion. The last treatment was 3 weeks, the cough has been getting worse and is now has been having trouble sleeping at night due to cough.  She has been restarted on prednisone at 40 mg three days ago, there has been no difference in the cough since that time. She is not on bactrim at this time, but she was on it previously. She was prescribed oxygen due to desats with exertion.    At last visit was noted that she had exertional hypoxia, review CT chest showed evidence of bilateral pneumonitis, as well as likely metastatic breast cancer.  It was thought that the patient will be treated empirically with antibiotics and prednisone for pneumonitis and possible infection, then reevaluate and reconsider bronchoscopy at that time.  Currenlty she notes that her breathing and cough are much better. She notes that the cough was much better with tessalon, the wheezing and crackling in her lungs are better and her breathing overall got better about a week after starting prednisone. She was not able to sleep due to shoulder blade pain, that is now better and she is able to sleep in her bed. She has been able to stop tessalon and cough did not come back.  She is off Eribulin, and is now on another chemo regimen.  She is now on prednisone taper by 10mg  weekly and is on PCP prophylaxis with bactrim.     Per previous visit with oncology: She was seen for second opinion at [facility] and underwent neoadjuvant chemotherapy with carbotaxol followed by dose dense AC. Posttreatment scans showed excellent response to treatment with resolution of hilar adenopathy as well as axillary adenopathy but persistent primary tumor at the right breast. She then went on to get right-sided lumpectomy along with axillary lymph node dissection. Final pathology showed 16 mm residual tumor. 0/12 Ln positive for malignancy. Grade 2. Negative margins. No LVI. ER, PR and her 2 neu negative. She  also completed radiation therapy to her chest wall and hilar region at in August 2019. She then completed adjuvant xeloda.Patient also had radiation pneumonitis and was treated with a prolonged course of steroids by radiation oncology at [facility]. Repeat PET CT scan on 08/29/2018 showed new enlarged hypermetabolic right paratracheal and subcarinal and AP window as well as right hilar lymph nodes compatible with nodal metastatic disease.  New and enlarging hypermetabolic irregular solid pulmonary nodules consistent with pulmonary metastases.Plan is to proceed with fourth line eribulinstarted in April 2020.  She is referred now from oncology with progressive cough, and desaturations, imaging showed multiple lung nodules which appear to have advanced since previous scan, and changes consistent with pneumonitis.   CT chest 01/02/2019>> imaging personally reviewed, there is bilateral patchy pneumonitis, with bilateral multiple small nodular change, and area of fibrosis.   Last surgery was several years ago >30 yrs ago.   Imaging personally reviewed, PET scan 04/04/17, there is mild right hilar and subcarinal lymphadenopathy which is positive on PET scan. Medication:    Current Outpatient Medications:  .  benzonatate (TESSALON PERLES) 100 MG capsule, Take 2 capsules (200 mg total) by mouth 3 (three) times daily., Disp: 90 capsule, Rfl: 2 .  Biotin 2500 MCG CAPS, Take 5,000 mcg/day by mouth daily., Disp: , Rfl:  .  Cholecalciferol (VITAMIN D3) 10000 units TABS, Take by mouth 1 day or 1 dose., Disp: , Rfl:  .  HYDROcodone-homatropine (HYCODAN) 5-1.5 MG/5ML syrup, Take 5 mLs by mouth every 6 (six) hours as needed for cough., Disp: 240 mL, Rfl: 0 .  Multiple Vitamins-Minerals (CENTRUM SILVER ADULT 50+) TABS, Take 1 tablet by mouth daily., Disp: , Rfl:  .  omega-3 acid ethyl esters (LOVAZA) 1 g capsule, Take by mouth 1 day or 1 dose., Disp: , Rfl:  .  pantoprazole (PROTONIX) 20 MG tablet, Take 1 tablet (20 mg total) by mouth daily., Disp: 30 tablet, Rfl: 0 .  predniSONE (DELTASONE) 20 MG tablet, Take 1 tablet (20 mg total) by mouth as directed. 60 mg x 7 days, 50 mg x 7 days, 40 mg x 7 days, 30 mg x 7 days, 20 mg x 7 days, 10 mg x 7 days. Each day take with food, Disp: 74 tablet, Rfl: 0 No current facility-administered medications for this visit.   Facility-Administered Medications Ordered in Other Visits:  .  Tbo-Filgrastim (GRANIX)  injection 480 mcg, 480 mcg, Subcutaneous, Daily, Sindy Guadeloupe, MD, 480 mcg at 05/19/17 1412   Allergies:  Penicillins  Review of Systems:  Constitutional: Feels well. Cardiovascular: Denies chest pain, exertional chest pain.  Pulmonary: Denies hemoptysis, pleuritic chest pain.   The remainder of systems were reviewed and were found to be negative other than what is documented in the HPI.    Physical Examination:   VS: BP 100/72   Pulse 83   Temp (!) 97.4 F (36.3 C) (Temporal)   Ht 5\' 3"  (1.6 m)   Wt 196 lb 6.4 oz (89.1 kg)   LMP 04/04/2012 (Approximate)   SpO2 96%   BMI 34.79 kg/m   General Appearance: No distress  Neuro:without focal findings, mental status, speech normal, alert and oriented HEENT: PERRLA, EOM intact Pulmonary: No wheezing, No rales  CardiovascularNormal S1,S2.  No m/r/g.  Abdomen: Benign, Soft, non-tender, No masses Renal:  No costovertebral tenderness  GU:  No performed at this time. Endoc: No evident thyromegaly, no signs of acromegaly or Cushing features Skin:   warm, no rashes, no ecchymosis  Extremities: normal, no cyanosis, clubbing.      LABORATORY PANEL:   CBC No results for input(s): WBC, HGB, HCT, PLT in the last 168 hours. ------------------------------------------------------------------------------------------------------------------  Chemistries  No results for input(s): NA, K, CL, CO2, GLUCOSE, BUN, CREATININE, CALCIUM, MG, AST, ALT, ALKPHOS, BILITOT in the last 168 hours.  Invalid input(s): GFRCGP ------------------------------------------------------------------------------------------------------------------  Cardiac Enzymes No results for input(s): TROPONINI in the last 168 hours. ------------------------------------------------------------  RADIOLOGY:  No results found.     Thank  you for the consultation and for allowing [facility] to assist in the care of your patient. Our  recommendations are noted above.  Please contact us if we can be of further service.  Marda Stalker, M.D., F.C.C.P.  Board Certified in Internal Medicine, Pulmonary Medicine, [place], and Sleep Medicine.  [facility] Office Number: [phone]  02/11/2019

## 2019-02-12 ENCOUNTER — Other Ambulatory Visit: Payer: Self-pay

## 2019-02-12 ENCOUNTER — Encounter: Payer: Self-pay | Admitting: Oncology

## 2019-02-13 ENCOUNTER — Encounter: Payer: Self-pay | Admitting: Internal Medicine

## 2019-02-13 ENCOUNTER — Ambulatory Visit (INDEPENDENT_AMBULATORY_CARE_PROVIDER_SITE_OTHER): Payer: Managed Care, Other (non HMO) | Admitting: Internal Medicine

## 2019-02-13 ENCOUNTER — Other Ambulatory Visit: Payer: Self-pay

## 2019-02-13 ENCOUNTER — Inpatient Hospital Stay: Payer: Managed Care, Other (non HMO)

## 2019-02-13 ENCOUNTER — Encounter: Payer: Self-pay | Admitting: Oncology

## 2019-02-13 ENCOUNTER — Inpatient Hospital Stay: Payer: Managed Care, Other (non HMO) | Attending: Oncology | Admitting: Oncology

## 2019-02-13 VITALS — BP 106/69 | HR 96 | Temp 97.1°F | Resp 16 | Wt 185.8 lb

## 2019-02-13 VITALS — BP 100/72 | HR 83 | Temp 97.4°F | Ht 63.0 in | Wt 196.4 lb

## 2019-02-13 DIAGNOSIS — J700 Acute pulmonary manifestations due to radiation: Secondary | ICD-10-CM | POA: Diagnosis not present

## 2019-02-13 DIAGNOSIS — Z880 Allergy status to penicillin: Secondary | ICD-10-CM | POA: Insufficient documentation

## 2019-02-13 DIAGNOSIS — Z5111 Encounter for antineoplastic chemotherapy: Secondary | ICD-10-CM | POA: Insufficient documentation

## 2019-02-13 DIAGNOSIS — C7800 Secondary malignant neoplasm of unspecified lung: Secondary | ICD-10-CM | POA: Diagnosis not present

## 2019-02-13 DIAGNOSIS — D6959 Other secondary thrombocytopenia: Secondary | ICD-10-CM | POA: Diagnosis not present

## 2019-02-13 DIAGNOSIS — C50411 Malignant neoplasm of upper-outer quadrant of right female breast: Secondary | ICD-10-CM

## 2019-02-13 DIAGNOSIS — Z9221 Personal history of antineoplastic chemotherapy: Secondary | ICD-10-CM | POA: Diagnosis not present

## 2019-02-13 DIAGNOSIS — Z923 Personal history of irradiation: Secondary | ICD-10-CM | POA: Diagnosis not present

## 2019-02-13 DIAGNOSIS — R5383 Other fatigue: Secondary | ICD-10-CM | POA: Diagnosis not present

## 2019-02-13 DIAGNOSIS — Z8042 Family history of malignant neoplasm of prostate: Secondary | ICD-10-CM | POA: Diagnosis not present

## 2019-02-13 DIAGNOSIS — R918 Other nonspecific abnormal finding of lung field: Secondary | ICD-10-CM | POA: Diagnosis not present

## 2019-02-13 DIAGNOSIS — R590 Localized enlarged lymph nodes: Secondary | ICD-10-CM | POA: Insufficient documentation

## 2019-02-13 DIAGNOSIS — Z171 Estrogen receptor negative status [ER-]: Secondary | ICD-10-CM | POA: Diagnosis not present

## 2019-02-13 DIAGNOSIS — C771 Secondary and unspecified malignant neoplasm of intrathoracic lymph nodes: Secondary | ICD-10-CM | POA: Insufficient documentation

## 2019-02-13 DIAGNOSIS — Z803 Family history of malignant neoplasm of breast: Secondary | ICD-10-CM | POA: Diagnosis not present

## 2019-02-13 DIAGNOSIS — Z79899 Other long term (current) drug therapy: Secondary | ICD-10-CM | POA: Diagnosis not present

## 2019-02-13 DIAGNOSIS — T451X5A Adverse effect of antineoplastic and immunosuppressive drugs, initial encounter: Secondary | ICD-10-CM

## 2019-02-13 DIAGNOSIS — Z808 Family history of malignant neoplasm of other organs or systems: Secondary | ICD-10-CM | POA: Insufficient documentation

## 2019-02-13 NOTE — Progress Notes (Signed)
Pt will switch to 20 mg on Friday and then 1 week of that and 1 week of 10 mg and then done. She is not coughing hardly at all right now, her back has stopped huritng and she sleeping in her bed now

## 2019-02-13 NOTE — Patient Instructions (Addendum)
Continue prednisone taper.  Use tessalon (cough tablets) as needed.  Increase activity.

## 2019-02-14 ENCOUNTER — Encounter: Payer: Self-pay | Admitting: Oncology

## 2019-02-14 NOTE — Progress Notes (Signed)
Hematology/Oncology Consult note [facility]  [phone] [phone]  Patient Care Team: Patient, No Pcp Per as PCP - General (General Practice) Byrnett, Forest Gleason, MD (General Surgery) Gae Dry, MD as Referring Physician (Obstetrics and Gynecology)   Name of the patient: Ashley Molina  [id number]  1961/03/30   Date of visit: 02/14/19  Diagnosis-  Stage IVinvasive mammary carcinoma of the right [id number] negative, PR 5% positive and HER-2/neu negativewith metastases to hilar lymph nodes  Chief complaint/ Reason for visit-on treatment assessment prior to cycle 2-day 1 of carboplatin and gemcitabine  Heme/Onc history: Patient is a 58 year old postmenopausal female who was diagnosed with 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 Her only site of metastatic disease was biopsy-proven hilar adenopathy. She was seen for second opinion at [facility] and underwent neoadjuvant chemotherapy with carbotaxol followed by dose dense AC. Posttreatment scans showed excellent response to treatment with resolution of hilar adenopathy as well as axillary adenopathy but persistent primary tumor at the right breast. She then went on to get right-sided lumpectomy along with axillary lymph node dissection. Final pathology showed 16 mm residual tumor. 0/12 Ln positive for malignancy. Grade 2. Negative margins. No LVI. ER, PR and her 2 neu negative. She also completed radiation therapy to her chest wall and hilar region at in August 2019. She then completed adjuvant xeloda.Patient also had radiation pneumonitis and was treated with a prolonged course of steroids by radiation oncology at [facility].  Patient has had baseline foundation 1 testing done which did not show any evidence of actionable mutations. PDL 1 was 0%. She also had strata testing done at [facility] which did not show any actionable mutations.  Repeat PET CT scan on  08/29/2018 showed new enlarged hypermetabolic right paratracheal and subcarinal and AP window as well as right hilar lymph nodes compatible with nodal metastatic disease. New and enlarging hypermetabolic irregular solid pulmonary nodules consistent with pulmonary metastases.Plan is to proceed with fourth line eribulinstarted in April 2020.  Interval history-reports that her breathing is significantly improved and her prednisone dose is now down to 25 mg.  She does not require oxygen for simple activities but she has been using oxygen for more labor-intensive activities such as vacuuming around the house.  Overall she feels well and denies other complaints at this time  ECOG PS- 1 Pain scale- 0 Opioid associated constipation- no  Review of systems- Review of Systems  Constitutional: Positive for malaise/fatigue. Negative for chills, fever and weight loss.  HENT: Negative for congestion, ear discharge and nosebleeds.   Eyes: Negative for blurred vision.  Respiratory: Positive for shortness of breath. Negative for cough, hemoptysis, sputum production and wheezing.   Cardiovascular: Negative for chest pain, palpitations, orthopnea and claudication.  Gastrointestinal: Negative for abdominal pain, blood in stool, constipation, diarrhea, heartburn, melena, nausea and vomiting.  Genitourinary: Negative for dysuria, flank pain, frequency, hematuria and urgency.  Musculoskeletal: Negative for back pain, joint pain and myalgias.  Skin: Negative for rash.  Neurological: Negative for dizziness, tingling, focal weakness, seizures, weakness and headaches.  Endo/Heme/Allergies: Does not bruise/bleed easily.  Psychiatric/Behavioral: Negative for depression and suicidal ideas. The patient does not have insomnia.        Allergies  Allergen Reactions  . Penicillins Rash     Past Medical History:  Diagnosis Date  . Anemia   . Breast cancer ([place])   . Breast cancer, right ([place]) 04/2017   Hx  Lumpectomy, Chemo + Rad  tx's.  . Breast cyst, right    aspirated by Dr. Bary Castilla  . Hyperlipidemia   . Personal history of chemotherapy   . Pre-diabetes      Past Surgical History:  Procedure Laterality Date  . AXILLARY LYMPH NODE BIOPSY Right 04/09/2017   Procedure: AXILLARY LYMPH NODE BIOPSY;  Surgeon: Robert Bellow, MD;  Location: [facility] ORS;  Service: General;  Laterality: Right;  . BREAST BIOPSY Right 03/27/2017   US guided breast mass - invasive mammary carcinoma  . BREAST BIOPSY Right 03/27/2017   Lymph node - metastatic carcinoma  . BREAST BIOPSY Right 04/09/2017   Lymph node   . BREAST CYST ASPIRATION Right 11/2009   Dr. Bary Castilla did FNA  . BREAST EXCISIONAL BIOPSY Right 09/28/2017   lumpectomy and 12 lympnode rad neo adj chemo  . COLONOSCOPY  2011  . DILATION AND CURETTAGE OF UTERUS     X3  . ENDOBRONCHIAL ULTRASOUND N/A 04/09/2017   Procedure: ENDOBRONCHIAL ULTRASOUND;  Surgeon: Laverle Hobby, MD;  Location: [facility] ORS;  Service: Pulmonary;  Laterality: N/A;  . ENDOMETRIAL ABLATION    . ENDOMETRIAL BIOPSY  09/2009  . PORTACATH PLACEMENT Left 04/09/2017   Procedure: INSERTION PORT-A-CATH;  Surgeon: Robert Bellow, MD;  Location: [facility] ORS;  Service: General;  Laterality: Left;    Social History   Socioeconomic History  . Marital status: Married    Spouse name: Not on file  . Number of children: Not on file  . Years of education: Not on file  . Highest education level: Not on file  Occupational History  . Not on file  Social Needs  . Financial resource strain: Not on file  . Food insecurity    Worry: Not on file    Inability: Not on file  . Transportation needs    Medical: Not on file    Non-medical: Not on file  Tobacco Use  . Smoking status: Never Smoker  . Smokeless tobacco: Never Used  Substance and Sexual Activity  . Alcohol use: Not Currently  . Drug use: No  . Sexual activity: Not Currently  Lifestyle  . Physical activity    Days per  week: Not on file    Minutes per session: Not on file  . Stress: Not on file  Relationships  . Social Herbalist on phone: Not on file    Gets together: Not on file    Attends religious service: Not on file    Active member of club or organization: Not on file    Attends meetings of clubs or organizations: Not on file    Relationship status: Not on file  . Intimate partner violence    Fear of current or ex partner: Not on file    Emotionally abused: Not on file    Physically abused: Not on file    Forced sexual activity: Not on file  Other Topics Concern  . Not on file  Social History Narrative  . Not on file    Family History  Problem Relation Age of Onset  . Melanoma Maternal Grandmother 70       currently 32  . Brain cancer Maternal Grandfather 35       unk. type; deceased in 9s  . Melanoma Other 11       mat grandmother's father  . Melanoma Father 87       on head; currently 70  . Prostate cancer Maternal Uncle  3 maternal uncles; dx in 82s  . Breast cancer Other        mat grandfather's sister; dx 39s     Current Outpatient Medications:  .  Biotin 2500 MCG CAPS, Take 5,000 mcg/day by mouth daily., Disp: , Rfl:  .  Cholecalciferol (VITAMIN D3) 10000 units TABS, Take by mouth 1 day or 1 dose., Disp: , Rfl:  .  Multiple Vitamins-Minerals (CENTRUM SILVER ADULT 50+) TABS, Take 1 tablet by mouth daily., Disp: , Rfl:  .  omega-3 acid ethyl esters (LOVAZA) 1 g capsule, Take by mouth 1 day or 1 dose., Disp: , Rfl:  .  pantoprazole (PROTONIX) 20 MG tablet, Take 1 tablet (20 mg total) by mouth daily., Disp: 30 tablet, Rfl: 0 .  predniSONE (DELTASONE) 20 MG tablet, Take 1 tablet (20 mg total) by mouth as directed. 60 mg x 7 days, 50 mg x 7 days, 40 mg x 7 days, 30 mg x 7 days, 20 mg x 7 days, 10 mg x 7 days. Each day take with food, Disp: 74 tablet, Rfl: 0 .  benzonatate (TESSALON PERLES) 100 MG capsule, Take 2 capsules (200 mg total) by mouth 3 (three) times  daily. (Patient not taking: Reported on 02/13/2019), Disp: 90 capsule, Rfl: 2 .  HYDROcodone-homatropine (HYCODAN) 5-1.5 MG/5ML syrup, Take 5 mLs by mouth every 6 (six) hours as needed for cough., Disp: 240 mL, Rfl: 0 No current facility-administered medications for this visit.   Facility-Administered Medications Ordered in Other Visits:  .  Tbo-Filgrastim (GRANIX) injection 480 mcg, 480 mcg, Subcutaneous, Daily, Sindy Guadeloupe, MD, 480 mcg at 05/19/17 1412  Physical exam:  Vitals:   02/13/19 0912  BP: 106/69  Pulse: 96  Resp: 16  Temp: (!) 97.1 F (36.2 C)  TempSrc: Tympanic  Weight: 185 lb 12.8 oz (84.3 kg)   Physical Exam Constitutional:      General: She is not in acute distress. HENT:     Head: Normocephalic and atraumatic.  Eyes:     Pupils: Pupils are equal, round, and reactive to light.  Neck:     Musculoskeletal: Normal range of motion.  Cardiovascular:     Rate and Rhythm: Normal rate and regular rhythm.     Heart sounds: Normal heart sounds.  Pulmonary:     Effort: Pulmonary effort is normal.     Breath sounds: Normal breath sounds.  Abdominal:     General: Bowel sounds are normal.     Palpations: Abdomen is soft.  Skin:    General: Skin is warm and dry.  Neurological:     Mental Status: She is alert and oriented to person, place, and time.      CMP Latest Ref Rng & Units 01/20/2019  Glucose 70 - 99 mg/dL 110(H)  BUN 6 - 20 mg/dL 19  Creatinine 0.44 - 1.00 mg/dL 0.84  Sodium 135 - 145 mmol/L 138  Potassium 3.5 - 5.1 mmol/L 3.8  Chloride 98 - 111 mmol/L 104  CO2 22 - 32 mmol/L 25  Calcium 8.9 - 10.3 mg/dL 9.7  Total Protein 6.5 - 8.1 g/dL 6.3(L)  Total Bilirubin 0.3 - 1.2 mg/dL 0.7  Alkaline Phos 38 - 126 U/L 93  AST 15 - 41 U/L 21  ALT 0 - 44 U/L 19   CBC Latest Ref Rng & Units 01/20/2019  WBC 4.0 - 10.5 K/uL 10.0  Hemoglobin 12.0 - 15.0 g/dL 12.4  Hematocrit 36.0 - 46.0 % 38.7  Platelets 150 - 400 K/uL  207      Assessment and plan- Patient  is a 58 y.o. 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  There was some concern for platelet aggregation as well.  CMP was within normal limits.  I will hold off on chemotherapy today and she will get her labs checked at [facility] next week.  If sample again shows aggregation we may have to collect her CBC in a citrated sample.  If platelet count next week of greater than 100 she can proceed for chemotherapy next week and I will see her back in 2 weeks for cycle 2-day 8 of carboplatin and gemcitabine.  If thrombocytopenia continues to be an issue I will reduce her carboplatin to AUC 1.5   Visit Diagnosis 1. Chemotherapy-induced thrombocytopenia   2. Malignant neoplasm of upper-outer quadrant of right breast in female, estrogen receptor negative ([place])      Dr. Randa Evens, MD, MPH [facility] [phone] 02/14/2019 8:15 AM

## 2019-02-19 ENCOUNTER — Encounter: Payer: Self-pay | Admitting: Oncology

## 2019-02-19 ENCOUNTER — Other Ambulatory Visit: Payer: Self-pay

## 2019-02-20 ENCOUNTER — Ambulatory Visit: Payer: Managed Care, Other (non HMO) | Admitting: Oncology

## 2019-02-20 ENCOUNTER — Inpatient Hospital Stay: Payer: Managed Care, Other (non HMO)

## 2019-02-20 ENCOUNTER — Ambulatory Visit: Payer: Managed Care, Other (non HMO)

## 2019-02-20 ENCOUNTER — Other Ambulatory Visit: Payer: Self-pay | Admitting: Oncology

## 2019-02-20 ENCOUNTER — Other Ambulatory Visit: Payer: Self-pay

## 2019-02-20 VITALS — BP 100/66 | HR 92 | Temp 97.2°F | Resp 20 | Wt 201.0 lb

## 2019-02-20 DIAGNOSIS — Z5111 Encounter for antineoplastic chemotherapy: Secondary | ICD-10-CM | POA: Diagnosis not present

## 2019-02-20 DIAGNOSIS — C50411 Malignant neoplasm of upper-outer quadrant of right female breast: Secondary | ICD-10-CM

## 2019-02-20 MED ORDER — PALONOSETRON HCL INJECTION 0.25 MG/5ML
0.2500 mg | Freq: Once | INTRAVENOUS | Status: AC
  Administered 2019-02-20: 14:00:00 0.25 mg via INTRAVENOUS
  Filled 2019-02-20: qty 5

## 2019-02-20 MED ORDER — HEPARIN SOD (PORK) LOCK FLUSH 100 UNIT/ML IV SOLN
500.0000 [IU] | Freq: Once | INTRAVENOUS | Status: AC | PRN
  Administered 2019-02-20: 15:00:00 500 [IU]
  Filled 2019-02-20: qty 5

## 2019-02-20 MED ORDER — SODIUM CHLORIDE 0.9 % IV SOLN
1600.0000 mg | Freq: Once | INTRAVENOUS | Status: AC
  Administered 2019-02-20: 1600 mg via INTRAVENOUS
  Filled 2019-02-20: qty 26.3

## 2019-02-20 MED ORDER — PROCHLORPERAZINE MALEATE 10 MG PO TABS
10.0000 mg | ORAL_TABLET | Freq: Once | ORAL | Status: AC
  Administered 2019-02-20: 10 mg via ORAL
  Filled 2019-02-20: qty 1

## 2019-02-20 MED ORDER — SODIUM CHLORIDE 0.9% FLUSH
10.0000 mL | INTRAVENOUS | Status: DC | PRN
  Administered 2019-02-20: 13:00:00 10 mL
  Filled 2019-02-20: qty 10

## 2019-02-20 MED ORDER — SODIUM CHLORIDE 0.9 % IV SOLN
260.0000 mg | Freq: Once | INTRAVENOUS | Status: AC
  Administered 2019-02-20: 260 mg via INTRAVENOUS
  Filled 2019-02-20: qty 26

## 2019-02-20 MED ORDER — SODIUM CHLORIDE 0.9 % IV SOLN
Freq: Once | INTRAVENOUS | Status: AC
  Administered 2019-02-20: 13:00:00 via INTRAVENOUS
  Filled 2019-02-20: qty 250

## 2019-02-26 ENCOUNTER — Encounter: Payer: Self-pay | Admitting: Oncology

## 2019-02-26 ENCOUNTER — Other Ambulatory Visit: Payer: Self-pay

## 2019-02-27 ENCOUNTER — Telehealth: Payer: Self-pay | Admitting: *Deleted

## 2019-02-27 ENCOUNTER — Ambulatory Visit: Payer: Managed Care, Other (non HMO)

## 2019-02-27 ENCOUNTER — Inpatient Hospital Stay: Payer: Managed Care, Other (non HMO)

## 2019-02-27 ENCOUNTER — Ambulatory Visit: Payer: Managed Care, Other (non HMO) | Admitting: Oncology

## 2019-02-27 ENCOUNTER — Inpatient Hospital Stay: Payer: Managed Care, Other (non HMO) | Admitting: Oncology

## 2019-02-27 NOTE — Telephone Encounter (Signed)
Called pt. And let her know that wbc 1.9 and ANC 1.2 and she feels that her counts are too low to give knowing that the counts drop with chemo and pt is already low. Will make a new appt for 10/2 and it will show on mychart. Pt.is agreeable to plan.

## 2019-03-05 ENCOUNTER — Encounter: Payer: Self-pay | Admitting: Oncology

## 2019-03-06 NOTE — Progress Notes (Signed)
Patient is coming in for follow up, she is doing well she mentions swelling in right arm for one month, no pain to it.

## 2019-03-07 ENCOUNTER — Inpatient Hospital Stay: Payer: Managed Care, Other (non HMO) | Attending: Oncology | Admitting: Oncology

## 2019-03-07 ENCOUNTER — Other Ambulatory Visit: Payer: Self-pay

## 2019-03-07 ENCOUNTER — Inpatient Hospital Stay: Payer: Managed Care, Other (non HMO)

## 2019-03-07 ENCOUNTER — Encounter: Payer: Self-pay | Admitting: Oncology

## 2019-03-07 ENCOUNTER — Other Ambulatory Visit: Payer: Self-pay | Admitting: *Deleted

## 2019-03-07 ENCOUNTER — Encounter: Payer: Self-pay | Admitting: *Deleted

## 2019-03-07 VITALS — BP 99/64 | HR 89 | Temp 97.2°F | Wt 201.0 lb

## 2019-03-07 DIAGNOSIS — R590 Localized enlarged lymph nodes: Secondary | ICD-10-CM | POA: Diagnosis not present

## 2019-03-07 DIAGNOSIS — Z808 Family history of malignant neoplasm of other organs or systems: Secondary | ICD-10-CM | POA: Diagnosis not present

## 2019-03-07 DIAGNOSIS — Z23 Encounter for immunization: Secondary | ICD-10-CM | POA: Diagnosis not present

## 2019-03-07 DIAGNOSIS — C50411 Malignant neoplasm of upper-outer quadrant of right female breast: Secondary | ICD-10-CM

## 2019-03-07 DIAGNOSIS — R0602 Shortness of breath: Secondary | ICD-10-CM | POA: Insufficient documentation

## 2019-03-07 DIAGNOSIS — D696 Thrombocytopenia, unspecified: Secondary | ICD-10-CM | POA: Diagnosis not present

## 2019-03-07 DIAGNOSIS — D6959 Other secondary thrombocytopenia: Secondary | ICD-10-CM | POA: Diagnosis not present

## 2019-03-07 DIAGNOSIS — Z5111 Encounter for antineoplastic chemotherapy: Secondary | ICD-10-CM

## 2019-03-07 DIAGNOSIS — Z8042 Family history of malignant neoplasm of prostate: Secondary | ICD-10-CM | POA: Diagnosis not present

## 2019-03-07 DIAGNOSIS — Z171 Estrogen receptor negative status [ER-]: Secondary | ICD-10-CM

## 2019-03-07 DIAGNOSIS — J700 Acute pulmonary manifestations due to radiation: Secondary | ICD-10-CM | POA: Insufficient documentation

## 2019-03-07 DIAGNOSIS — Z5189 Encounter for other specified aftercare: Secondary | ICD-10-CM | POA: Insufficient documentation

## 2019-03-07 DIAGNOSIS — Z923 Personal history of irradiation: Secondary | ICD-10-CM | POA: Diagnosis not present

## 2019-03-07 DIAGNOSIS — M7989 Other specified soft tissue disorders: Secondary | ICD-10-CM | POA: Diagnosis not present

## 2019-03-07 DIAGNOSIS — R5383 Other fatigue: Secondary | ICD-10-CM | POA: Diagnosis not present

## 2019-03-07 DIAGNOSIS — Z79899 Other long term (current) drug therapy: Secondary | ICD-10-CM | POA: Insufficient documentation

## 2019-03-07 DIAGNOSIS — R918 Other nonspecific abnormal finding of lung field: Secondary | ICD-10-CM | POA: Diagnosis not present

## 2019-03-07 DIAGNOSIS — Z803 Family history of malignant neoplasm of breast: Secondary | ICD-10-CM | POA: Insufficient documentation

## 2019-03-07 DIAGNOSIS — R600 Localized edema: Secondary | ICD-10-CM | POA: Diagnosis not present

## 2019-03-07 DIAGNOSIS — T451X5A Adverse effect of antineoplastic and immunosuppressive drugs, initial encounter: Secondary | ICD-10-CM

## 2019-03-07 MED ORDER — PROCHLORPERAZINE MALEATE 10 MG PO TABS
10.0000 mg | ORAL_TABLET | Freq: Once | ORAL | Status: AC
  Administered 2019-03-07: 10 mg via ORAL
  Filled 2019-03-07: qty 1

## 2019-03-07 MED ORDER — DEXAMETHASONE SODIUM PHOSPHATE 10 MG/ML IJ SOLN
10.0000 mg | Freq: Once | INTRAMUSCULAR | Status: AC
  Administered 2019-03-07: 10 mg via INTRAVENOUS
  Filled 2019-03-07: qty 1

## 2019-03-07 MED ORDER — PEGFILGRASTIM 6 MG/0.6ML ~~LOC~~ PSKT
6.0000 mg | PREFILLED_SYRINGE | Freq: Once | SUBCUTANEOUS | Status: AC
  Administered 2019-03-07: 6 mg via SUBCUTANEOUS
  Filled 2019-03-07: qty 0.6

## 2019-03-07 MED ORDER — SODIUM CHLORIDE 0.9% FLUSH
10.0000 mL | INTRAVENOUS | Status: DC | PRN
  Administered 2019-03-07: 10 mL
  Filled 2019-03-07: qty 10

## 2019-03-07 MED ORDER — SODIUM CHLORIDE 0.9 % IV SOLN
1600.0000 mg | Freq: Once | INTRAVENOUS | Status: AC
  Administered 2019-03-07: 1600 mg via INTRAVENOUS
  Filled 2019-03-07: qty 26.3

## 2019-03-07 MED ORDER — SODIUM CHLORIDE 0.9 % IV SOLN
INTRAVENOUS | Status: DC
  Administered 2019-03-07: 10:00:00 via INTRAVENOUS
  Filled 2019-03-07: qty 250

## 2019-03-07 MED ORDER — HEPARIN SOD (PORK) LOCK FLUSH 100 UNIT/ML IV SOLN
500.0000 [IU] | Freq: Once | INTRAVENOUS | Status: AC | PRN
  Administered 2019-03-07: 500 [IU]
  Filled 2019-03-07: qty 5

## 2019-03-07 NOTE — Progress Notes (Addendum)
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 Her only site of metastatic disease was biopsy-proven hilar adenopathy. She was seen for second opinion at [facility] and underwent neoadjuvant chemotherapy with carbotaxol followed by dose dense AC. Posttreatment scans showed excellent response to treatment with resolution of hilar adenopathy as well as axillary adenopathy but persistent primary tumor at the right breast. She then went on to get right-sided lumpectomy along with axillary lymph node dissection. Final pathology showed 16 mm residual tumor. 0/12 Ln positive for malignancy. Grade 2. Negative margins. No LVI. ER, PR and her 2 neu negative. She also completed radiation therapy to her chest wall and hilar region at in August 2019. She then completed adjuvant xeloda.Patient also had radiation pneumonitis and was treated with a prolonged course of steroids by radiation oncology at [facility].  Patient has had baseline foundation 1 testing done which did not show any evidence of actionable mutations. PDL 1 was 0%. She also had strata testing done at [facility] which did not show any actionable mutations.  Repeat PET CT scan on  08/29/2018 showed new enlarged hypermetabolic right paratracheal and subcarinal and AP window as well as right hilar lymph nodes compatible with nodal metastatic disease. New and enlarging hypermetabolic irregular solid pulmonary nodules consistent with pulmonary metastases.Eribulin was started in April 2020 but patient had quick disease progression and has been switched to carboplatin and gemcitabine   Interval history-patient has not received chemotherapy due to neutropenia/thrombocytopenia for about 2 weeks now.  She feels significantly improved and her shortness of breath is almost resolved.  She reports more energy.  She continues to work.  She has had right upper extremity swelling over the last 1 month  ECOG PS- 1 Pain scale- 0   Review of systems- Review of Systems  Constitutional: Positive for malaise/fatigue. Negative for chills, fever and weight loss.  HENT: Negative for congestion, ear discharge and nosebleeds.   Eyes: Negative for blurred vision.  Respiratory: Negative for cough, hemoptysis, sputum production, shortness of breath and wheezing.   Cardiovascular: Negative for chest pain, palpitations, orthopnea and claudication.  Gastrointestinal: Negative for abdominal pain, blood in stool, constipation, diarrhea, heartburn, melena, nausea and vomiting.  Genitourinary: Negative for dysuria, flank pain, frequency, hematuria and urgency.  Musculoskeletal: Negative for back pain, joint pain and myalgias.  Skin: Negative for rash.  Neurological: Negative for dizziness, tingling, focal weakness, seizures, weakness and headaches.  Endo/Heme/Allergies: Does not bruise/bleed easily.  Psychiatric/Behavioral: Negative for depression and suicidal ideas. The patient does not have insomnia.       Allergies  Allergen Reactions  . Penicillins Rash     Past Medical History:  Diagnosis Date  . Anemia   . Breast cancer ([place])   . Breast cancer, right ([place]) 04/2017   Hx Lumpectomy,  Chemo + Rad tx's.  Marland Kitchen  Breast cyst, right    aspirated by Dr. Bary Castilla  . Hyperlipidemia   . Personal history of chemotherapy   . Pre-diabetes      Past Surgical History:  Procedure Laterality Date  . AXILLARY LYMPH NODE BIOPSY Right 04/09/2017   Procedure: AXILLARY LYMPH NODE BIOPSY;  Surgeon: Robert Bellow, MD;  Location: [facility] ORS;  Service: General;  Laterality: Right;  . BREAST BIOPSY Right 03/27/2017   US guided breast mass - invasive mammary carcinoma  . BREAST BIOPSY Right 03/27/2017   Lymph node - metastatic carcinoma  . BREAST BIOPSY Right 04/09/2017   Lymph node   . BREAST CYST ASPIRATION Right 11/2009   Dr. Bary Castilla did FNA  . BREAST EXCISIONAL BIOPSY Right 09/28/2017   lumpectomy and 12 lympnode rad neo adj chemo  . COLONOSCOPY  2011  . DILATION AND CURETTAGE OF UTERUS     X3  . ENDOBRONCHIAL ULTRASOUND N/A 04/09/2017   Procedure: ENDOBRONCHIAL ULTRASOUND;  Surgeon: Laverle Hobby, MD;  Location: [facility] ORS;  Service: Pulmonary;  Laterality: N/A;  . ENDOMETRIAL ABLATION    . ENDOMETRIAL BIOPSY  09/2009  . PORTACATH PLACEMENT Left 04/09/2017   Procedure: INSERTION PORT-A-CATH;  Surgeon: Robert Bellow, MD;  Location: [facility] ORS;  Service: General;  Laterality: Left;    Social History   Socioeconomic History  . Marital status: Married    Spouse name: Not on file  . Number of children: Not on file  . Years of education: Not on file  . Highest education level: Not on file  Occupational History  . Not on file  Social Needs  . Financial resource strain: Not on file  . Food insecurity    Worry: Not on file    Inability: Not on file  . Transportation needs    Medical: Not on file    Non-medical: Not on file  Tobacco Use  . Smoking status: Never Smoker  . Smokeless tobacco: Never Used  Substance and Sexual Activity  . Alcohol use: Not Currently  . Drug use: No  . Sexual activity: Not Currently  Lifestyle  . Physical activity    Days per week: Not  on file    Minutes per session: Not on file  . Stress: Not on file  Relationships  . Social Herbalist on phone: Not on file    Gets together: Not on file    Attends religious service: Not on file    Active member of club or organization: Not on file    Attends meetings of clubs or organizations: Not on file    Relationship status: Not on file  . Intimate partner violence    Fear of current or ex partner: Not on file    Emotionally abused: Not on file    Physically abused: Not on file    Forced sexual activity: Not on file  Other Topics Concern  . Not on file  Social History Narrative  . Not on file    Family History  Problem Relation Age of Onset  . Melanoma Maternal Grandmother 67       currently 39  . Brain cancer Maternal Grandfather 44       unk. type; deceased in 33s  . Melanoma Other 21       mat grandmother's father  . Melanoma Father 39       on head; currently 83  . Prostate cancer Maternal Uncle        3 maternal uncles;  dx in 74s  . Breast cancer Other        mat grandfather's sister; dx 14s     Current Outpatient Medications:  .  Biotin 2500 MCG CAPS, Take 5,000 mcg/day by mouth daily., Disp: , Rfl:  .  Cholecalciferol (VITAMIN D3) 10000 units TABS, Take by mouth 1 day or 1 dose., Disp: , Rfl:  .  Multiple Vitamins-Minerals (CENTRUM SILVER ADULT 50+) TABS, Take 1 tablet by mouth daily., Disp: , Rfl:  .  omega-3 acid ethyl esters (LOVAZA) 1 g capsule, Take by mouth 1 day or 1 dose., Disp: , Rfl:  .  benzonatate (TESSALON PERLES) 100 MG capsule, Take 2 capsules (200 mg total) by mouth 3 (three) times daily. (Patient not taking: Reported on 03/06/2019), Disp: 90 capsule, Rfl: 2 .  HYDROcodone-homatropine (HYCODAN) 5-1.5 MG/5ML syrup, Take 5 mLs by mouth every 6 (six) hours as needed for cough. (Patient not taking: Reported on 03/06/2019), Disp: 240 mL, Rfl: 0 .  pantoprazole (PROTONIX) 20 MG tablet, Take 1 tablet (20 mg total) by mouth daily. (Patient  not taking: Reported on 03/06/2019), Disp: 30 tablet, Rfl: 0 .  predniSONE (DELTASONE) 20 MG tablet, Take 1 tablet (20 mg total) by mouth as directed. 60 mg x 7 days, 50 mg x 7 days, 40 mg x 7 days, 30 mg x 7 days, 20 mg x 7 days, 10 mg x 7 days. Each day take with food (Patient not taking: Reported on 03/06/2019), Disp: 74 tablet, Rfl: 0 No current facility-administered medications for this visit.   Facility-Administered Medications Ordered in Other Visits:  .  0.9 %  sodium chloride infusion, , Intravenous, Continuous, Sindy Guadeloupe, MD, Stopped at 03/07/19 1158 .  sodium chloride flush (NS) 0.9 % injection 10 mL, 10 mL, Intracatheter, PRN, Sindy Guadeloupe, MD, 10 mL at 03/07/19 1008 .  Tbo-Filgrastim (GRANIX) injection 480 mcg, 480 mcg, Subcutaneous, Daily, Sindy Guadeloupe, MD, 480 mcg at 05/19/17 1412  Physical exam:  Vitals:   03/07/19 0927  BP: 99/64  Pulse: 89  Temp: (!) 97.2 F (36.2 C)  TempSrc: Tympanic  Weight: 201 lb (91.2 kg)   Physical Exam Constitutional:      General: She is not in acute distress. HENT:     Head: Normocephalic and atraumatic.  Eyes:     Pupils: Pupils are equal, round, and reactive to light.  Neck:     Musculoskeletal: Normal range of motion.  Cardiovascular:     Rate and Rhythm: Normal rate and regular rhythm.     Heart sounds: Normal heart sounds.  Pulmonary:     Effort: Pulmonary effort is normal.     Breath sounds: Normal breath sounds.  Abdominal:     General: Bowel sounds are normal.     Palpations: Abdomen is soft.  Musculoskeletal:     Comments: Right upper extremity appears mildly more swollen as compared to the left  Skin:    General: Skin is warm and dry.  Neurological:     Mental Status: She is alert and oriented to person, place, and time.      CMP Latest Ref Rng & Units 01/20/2019  Glucose 70 - 99 mg/dL 110(H)  BUN 6 - 20 mg/dL 19  Creatinine 0.44 - 1.00 mg/dL 0.84  Sodium 135 - 145 mmol/L 138  Potassium 3.5 - 5.1 mmol/L  3.8  Chloride 98 - 111 mmol/L 104  CO2 22 - 32 mmol/L 25  Calcium 8.9 - 10.3 mg/dL 9.7  Total Protein 6.5 - 8.1 g/dL 6.3(L)  Total Bilirubin 0.3 - 1.2 mg/dL 0.7  Alkaline Phos 38 - 126 U/L 93  AST 15 - 41 U/L 21  ALT 0 - 44 U/L 19   CBC Latest Ref Rng & Units 01/20/2019  WBC 4.0 - 10.5 K/uL 10.0  Hemoglobin 12.0 - 15.0 g/dL 12.4  Hematocrit 36.0 - 46.0 % 38.7  Platelets 150 - 400 K/uL 207     Assessment and plan- Patient is a 58 y.o. female with stage IV triple negative invasive mammary carcinoma of the right breast T3 N1 M1 with metastases to hilum and subcarinal lymph nodes and most recently disease progression in the lung. She is here for on treatment assessment prior to cycle 2-day 8 of carboplatin and gemcitabine  It has been 2 weeks since her last chemotherapy.Her white count is improved to 3.3 with an [place] of 2.1.  Hemoglobin is stable around 10 but platelets are 96.  A week prior her platelets were 206 and she has not received any treatment over the last 2 weeks.  Discussed that we are encountering myelosuppression mainly because of carboplatin and given that she is heavily pretreated it may be difficult to give her chemo consistently with carboplatin.  I therefore plan to drop the carboplatin and proceed with gemcitabine alone at 800 mg per metered square which she will receive today along with on pro-Neulasta support.  I will see her back in 2 weeks time with labs done at [facility] with differential and CMP prior.  If her counts hold up I plan to give her gemcitabine 2 weeks on 1 week off.  I could consider adding carboplatin on day 8 alone down the line.  Patient verbalized understanding  Right upper extremity swelling suspect this is secondary to lymphedema from her prior axillary lymph node dissection.  However I will get an ultrasound to rule out DVT at this time  I will plan to get repeat scans after 3 cycles   Visit Diagnosis 1. Malignant neoplasm of upper-outer quadrant  of right breast in female, estrogen receptor negative ([place])   2. Encounter for antineoplastic chemotherapy   3. Chemotherapy-induced thrombocytopenia      Dr. Randa Evens, MD, MPH [facility] at [facility] [phone] 03/07/2019 12:20 PM

## 2019-03-07 NOTE — Progress Notes (Signed)
Platelets 96 today. Per Dr Janese Banks okay to proceed with tx, patient receiving Gemzar only today

## 2019-03-13 ENCOUNTER — Ambulatory Visit
Admission: RE | Admit: 2019-03-13 | Discharge: 2019-03-13 | Disposition: A | Discharge status: Home / Self Care | Payer: Managed Care, Other (non HMO) | Source: Ambulatory Visit | Attending: Oncology | Admitting: Oncology

## 2019-03-13 ENCOUNTER — Other Ambulatory Visit: Payer: Self-pay

## 2019-03-13 DIAGNOSIS — C50411 Malignant neoplasm of upper-outer quadrant of right female breast: Secondary | ICD-10-CM

## 2019-03-13 DIAGNOSIS — M7989 Other specified soft tissue disorders: Secondary | ICD-10-CM | POA: Diagnosis present

## 2019-03-13 DIAGNOSIS — Z171 Estrogen receptor negative status [ER-]: Secondary | ICD-10-CM | POA: Diagnosis present

## 2019-03-19 ENCOUNTER — Encounter: Payer: Self-pay | Admitting: Oncology

## 2019-03-20 ENCOUNTER — Encounter: Payer: Self-pay | Admitting: Oncology

## 2019-03-20 ENCOUNTER — Other Ambulatory Visit: Payer: Self-pay

## 2019-03-20 NOTE — Progress Notes (Signed)
Patient stated that she had been doing well with no complaints. Patient asked if she could get her flu injection.

## 2019-03-21 ENCOUNTER — Other Ambulatory Visit: Payer: Self-pay | Admitting: *Deleted

## 2019-03-21 ENCOUNTER — Other Ambulatory Visit: Payer: Self-pay

## 2019-03-21 ENCOUNTER — Inpatient Hospital Stay (HOSPITAL_BASED_OUTPATIENT_CLINIC_OR_DEPARTMENT_OTHER): Payer: Managed Care, Other (non HMO) | Admitting: Oncology

## 2019-03-21 ENCOUNTER — Inpatient Hospital Stay: Payer: Managed Care, Other (non HMO)

## 2019-03-21 VITALS — BP 97/71 | HR 101 | Temp 96.7°F | Resp 16 | Ht 63.0 in | Wt 201.1 lb

## 2019-03-21 VITALS — HR 100

## 2019-03-21 DIAGNOSIS — Z5111 Encounter for antineoplastic chemotherapy: Secondary | ICD-10-CM

## 2019-03-21 DIAGNOSIS — C50911 Malignant neoplasm of unspecified site of right female breast: Secondary | ICD-10-CM

## 2019-03-21 DIAGNOSIS — Z171 Estrogen receptor negative status [ER-]: Secondary | ICD-10-CM | POA: Diagnosis not present

## 2019-03-21 DIAGNOSIS — Z23 Encounter for immunization: Secondary | ICD-10-CM

## 2019-03-21 DIAGNOSIS — C50411 Malignant neoplasm of upper-outer quadrant of right female breast: Secondary | ICD-10-CM

## 2019-03-21 MED ORDER — INFLUENZA VAC SPLIT QUAD 0.5 ML IM SUSY
0.5000 mL | PREFILLED_SYRINGE | Freq: Once | INTRAMUSCULAR | Status: AC
  Administered 2019-03-21: 11:00:00 0.5 mL via INTRAMUSCULAR
  Filled 2019-03-21: qty 0.5

## 2019-03-21 MED ORDER — SODIUM CHLORIDE 0.9 % IV SOLN
1600.0000 mg | Freq: Once | INTRAVENOUS | Status: AC
  Administered 2019-03-21: 1600 mg via INTRAVENOUS
  Filled 2019-03-21: qty 26.3

## 2019-03-21 MED ORDER — PROCHLORPERAZINE MALEATE 10 MG PO TABS
10.0000 mg | ORAL_TABLET | Freq: Once | ORAL | Status: AC
  Administered 2019-03-21: 11:00:00 10 mg via ORAL
  Filled 2019-03-21: qty 1

## 2019-03-21 MED ORDER — HEPARIN SOD (PORK) LOCK FLUSH 100 UNIT/ML IV SOLN
500.0000 [IU] | Freq: Once | INTRAVENOUS | Status: AC | PRN
  Administered 2019-03-21: 500 [IU]
  Filled 2019-03-21: qty 5

## 2019-03-21 MED ORDER — PALONOSETRON HCL INJECTION 0.25 MG/5ML
0.2500 mg | Freq: Once | INTRAVENOUS | Status: AC
  Administered 2019-03-21: 0.25 mg via INTRAVENOUS
  Filled 2019-03-21: qty 5

## 2019-03-21 MED ORDER — SODIUM CHLORIDE 0.9 % IV SOLN
Freq: Once | INTRAVENOUS | Status: AC
  Administered 2019-03-21: 11:00:00 via INTRAVENOUS
  Filled 2019-03-21: qty 250

## 2019-03-21 MED ORDER — SODIUM CHLORIDE 0.9% FLUSH
10.0000 mL | INTRAVENOUS | Status: DC | PRN
  Administered 2019-03-21: 10 mL
  Filled 2019-03-21: qty 10

## 2019-03-21 NOTE — Progress Notes (Signed)
Pt states she feels like the numbness is lottle worse-3 finger tips and thumbs on both hands

## 2019-03-23 NOTE — Progress Notes (Signed)
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 Her only site of metastatic disease was biopsy-proven hilar adenopathy. She was seen for second opinion at [facility] and underwent neoadjuvant chemotherapy with carbotaxol followed by dose dense AC. Posttreatment scans showed excellent response to treatment with resolution of hilar adenopathy as well as axillary adenopathy but persistent primary tumor at the right breast. She then went on to get right-sided lumpectomy along with axillary lymph node dissection. Final pathology showed 16 mm residual tumor. 0/12 Ln positive for malignancy. Grade 2. Negative margins. No LVI. ER, PR and her 2 neu negative. She also completed radiation therapy to her chest wall and hilar region at in August 2019. She then completed adjuvant xeloda.Patient also had radiation pneumonitis and was treated with a prolonged course of steroids by radiation oncology at [facility].  Patient has had baseline foundation 1 testing done which did not show any evidence of actionable mutations. PDL 1 was 0%. She also had strata testing done at [facility] which did not show any actionable mutations.  Repeat PET CT scan on 08/29/2018 showed new  enlarged hypermetabolic right paratracheal and subcarinal and AP window as well as right hilar lymph nodes compatible with nodal metastatic disease. New and enlarging hypermetabolic irregular solid pulmonary nodules consistent with pulmonary metastases.Eribulin was started in April 2020 but patient had quick disease progression and has been switched to carboplatin and gemcitabine  Interval history-  Patient has completed her course of steroids for pneumonitis.  She reports a tablet worsening in her exertional shortness of breath and fatigue since then.  Denies other complaints at this time  ECOG PS- 1 Pain scale- 0 Opioid associated constipation- no  Review of systems- Review of Systems  Constitutional: Positive for malaise/fatigue. Negative for chills, fever and weight loss.  HENT: Negative for congestion, ear discharge and nosebleeds.   Eyes: Negative for blurred vision.  Respiratory: Positive for shortness of breath. Negative for cough, hemoptysis, sputum production and wheezing.   Cardiovascular: Negative for chest pain, palpitations, orthopnea and claudication.  Gastrointestinal: Negative for abdominal pain, blood in stool, constipation, diarrhea, heartburn, melena, nausea and vomiting.  Genitourinary: Negative for dysuria, flank pain, frequency, hematuria and urgency.  Musculoskeletal: Negative for back pain, joint pain and myalgias.  Skin: Negative for rash.  Neurological: Negative for dizziness, tingling, focal weakness, seizures, weakness and headaches.  Endo/Heme/Allergies: Does not bruise/bleed easily.  Psychiatric/Behavioral: Negative for depression and suicidal ideas. The patient does not have insomnia.       Allergies  Allergen Reactions   Penicillins Rash     Past Medical History:  Diagnosis Date   Anemia    Breast cancer ([place])    Breast cancer, right ([place]) 04/2017   Hx Lumpectomy, Chemo + Rad tx's.   Breast cyst, right    aspirated by Dr. Bary Castilla  Hyperlipidemia    Personal history of chemotherapy    Pre-diabetes      Past Surgical History:  Procedure Laterality Date   AXILLARY LYMPH NODE BIOPSY Right 04/09/2017   Procedure: AXILLARY LYMPH NODE BIOPSY;  Surgeon: Robert Bellow, MD;  Location: [facility] ORS;  Service: General;  Laterality: Right;   BREAST BIOPSY Right 03/27/2017   US guided breast mass - invasive mammary carcinoma   BREAST BIOPSY Right 03/27/2017   Lymph node - metastatic carcinoma   BREAST BIOPSY Right 04/09/2017   Lymph node    BREAST CYST ASPIRATION Right 11/2009   Dr. Bary Castilla did FNA   BREAST EXCISIONAL BIOPSY Right 09/28/2017   lumpectomy and 12 lympnode rad neo adj chemo   COLONOSCOPY  2011   DILATION AND CURETTAGE OF UTERUS     X3   ENDOBRONCHIAL ULTRASOUND N/A 04/09/2017   Procedure: ENDOBRONCHIAL ULTRASOUND;  Surgeon: Laverle Hobby, MD;  Location: [facility] ORS;  Service: Pulmonary;  Laterality: N/A;   ENDOMETRIAL ABLATION     ENDOMETRIAL BIOPSY  09/2009   PORTACATH PLACEMENT Left 04/09/2017   Procedure: INSERTION PORT-A-CATH;  Surgeon: Robert Bellow, MD;  Location: [facility] ORS;  Service: General;  Laterality: Left;    Social History   Socioeconomic History   Marital status: Married    Spouse name: Not on file   Number of children: Not on file   Years of education: Not on file   Highest education level: Not on file  Occupational History   Not on file  Social Needs   Financial resource strain: Not on file   Food insecurity    Worry: Not on file    Inability: Not on file   Transportation needs    Medical: Not on file    Non-medical: Not on file  Tobacco Use   Smoking status: Never Smoker   Smokeless tobacco: Never Used  Substance and Sexual Activity   Alcohol use: Not Currently   Drug use: No   Sexual activity: Not Currently  Lifestyle   Physical activity    Days per week: Not on file    Minutes per session: Not on file   Stress: Not on file    Relationships   Social connections    Talks on phone: Not on file    Gets together: Not on file    Attends religious service: Not on file    Active member of club or organization: Not on file    Attends meetings of clubs or organizations: Not on file    Relationship status: Not on file   Intimate partner violence    Fear of current or ex partner: Not on file    Emotionally abused: Not on file    Physically abused: Not on file    Forced sexual activity: Not on file  Other Topics Concern   Not on file  Social History Narrative   Not on file    Family History  Problem Relation Age of Onset   Melanoma Maternal Grandmother 98       currently 41   Brain cancer Maternal Grandfather 12       unk. type; deceased in 27s   Melanoma Other 88       mat grandmother's father   Melanoma Father 22       on head; currently 62   Prostate cancer Maternal Uncle        3 maternal uncles; dx in 21s   Breast cancer Other  mat grandfather's sister; dx 71s     Current Outpatient Medications:    Biotin 2500 MCG CAPS, Take 5,000 mcg/day by mouth daily., Disp: , Rfl:    Cholecalciferol (VITAMIN D3) 10000 units TABS, Take by mouth 1 day or 1 dose., Disp: , Rfl:    Multiple Vitamins-Minerals (CENTRUM SILVER ADULT 50+) TABS, Take 1 tablet by mouth daily., Disp: , Rfl:    omega-3 acid ethyl esters (LOVAZA) 1 g capsule, Take by mouth 1 day or 1 dose., Disp: , Rfl:    benzonatate (TESSALON PERLES) 100 MG capsule, Take 2 capsules (200 mg total) by mouth 3 (three) times daily. (Patient not taking: Reported on 03/06/2019), Disp: 90 capsule, Rfl: 2   HYDROcodone-homatropine (HYCODAN) 5-1.5 MG/5ML syrup, Take 5 mLs by mouth every 6 (six) hours as needed for cough. (Patient not taking: Reported on 03/06/2019), Disp: 240 mL, Rfl: 0   pantoprazole (PROTONIX) 20 MG tablet, Take 1 tablet (20 mg total) by mouth daily. (Patient not taking: Reported on 03/06/2019), Disp: 30 tablet, Rfl: 0    predniSONE (DELTASONE) 20 MG tablet, Take 1 tablet (20 mg total) by mouth as directed. 60 mg x 7 days, 50 mg x 7 days, 40 mg x 7 days, 30 mg x 7 days, 20 mg x 7 days, 10 mg x 7 days. Each day take with food (Patient not taking: Reported on 03/06/2019), Disp: 74 tablet, Rfl: 0 No current facility-administered medications for this visit.   Facility-Administered Medications Ordered in Other Visits:    Tbo-Filgrastim (GRANIX) injection 480 mcg, 480 mcg, Subcutaneous, Daily, Sindy Guadeloupe, MD, 480 mcg at 05/19/17 1412  Physical exam:  Vitals:   03/21/19 1015  BP: 97/71  Pulse: (!) 101  Resp: 16  Temp: (!) 96.7 F (35.9 C)  TempSrc: Tympanic  Weight: 201 lb 1.6 oz (91.2 kg)  Height: 5' 3"  (1.6 m)   Physical Exam Constitutional:      General: She is not in acute distress. HENT:     Head: Normocephalic and atraumatic.  Eyes:     Pupils: Pupils are equal, round, and reactive to light.  Neck:     Musculoskeletal: Normal range of motion.  Cardiovascular:     Rate and Rhythm: Normal rate and regular rhythm.     Heart sounds: Normal heart sounds.  Pulmonary:     Effort: Pulmonary effort is normal.     Breath sounds: Normal breath sounds.  Abdominal:     General: Bowel sounds are normal.     Palpations: Abdomen is soft.  Skin:    General: Skin is warm and dry.  Neurological:     Mental Status: She is alert and oriented to person, place, and time.      CMP Latest Ref Rng & Units 01/20/2019  Glucose 70 - 99 mg/dL 110(H)  BUN 6 - 20 mg/dL 19  Creatinine 0.44 - 1.00 mg/dL 0.84  Sodium 135 - 145 mmol/L 138  Potassium 3.5 - 5.1 mmol/L 3.8  Chloride 98 - 111 mmol/L 104  CO2 22 - 32 mmol/L 25  Calcium 8.9 - 10.3 mg/dL 9.7  Total Protein 6.5 - 8.1 g/dL 6.3(L)  Total Bilirubin 0.3 - 1.2 mg/dL 0.7  Alkaline Phos 38 - 126 U/L 93  AST 15 - 41 U/L 21  ALT 0 - 44 U/L 19   CBC Latest Ref Rng & Units 01/20/2019  WBC 4.0 - 10.5 K/uL 10.0  Hemoglobin 12.0 - 15.0 g/dL 12.4  Hematocrit 36.0  - 46.0 %  38.7  Platelets 150 - 400 K/uL 207    No images are attached to the encounter.  US Venous Img Upper Uni Right  Result Date: 03/13/2019 CLINICAL DATA:  Right upper extremity edema. History of right-sided breast cancer with right axillary lymph node dissection. Evaluate for DVT. EXAM: RIGHT UPPER EXTREMITY VENOUS DOPPLER ULTRASOUND TECHNIQUE: Gray-scale sonography with graded compression, as well as color Doppler and duplex ultrasound were performed to evaluate the upper extremity deep venous system from the level of the subclavian vein and including the jugular, axillary, basilic, radial, ulnar and upper cephalic vein. Spectral Doppler was utilized to evaluate flow at rest and with distal augmentation maneuvers. COMPARISON:  None. FINDINGS: Contralateral Subclavian Vein: Respiratory phasicity is normal and symmetric with the symptomatic side. Port a catheter tubing is seen within mid aspect the left subclavian vein (image 4), however there is no associated pericatheter thrombosis. Normal compressibility. Internal Jugular Vein: No evidence of thrombus. Normal compressibility, respiratory phasicity and response to augmentation. Subclavian Vein: No evidence of thrombus. Normal compressibility, respiratory phasicity and response to augmentation. Axillary Vein: No evidence of thrombus. Normal compressibility, respiratory phasicity and response to augmentation. Cephalic Vein: No evidence of thrombus. Normal compressibility, respiratory phasicity and response to augmentation. Basilic Vein: No evidence of thrombus. Normal compressibility, respiratory phasicity and response to augmentation. Brachial Veins: No evidence of thrombus. Normal compressibility, respiratory phasicity and response to augmentation. Radial Veins: No evidence of thrombus. Normal compressibility, respiratory phasicity and response to augmentation. Ulnar Veins: No evidence of thrombus. Normal compressibility, respiratory phasicity and  response to augmentation. Venous Reflux:  None visualized. Other Findings:  None visualized. IMPRESSION: No evidence of DVT within the right upper extremity. Electronically Signed   By: Sandi Mariscal M.D.   On: 03/13/2019 12:20     Assessment and plan- Patient is a 58 y.o. female with stage IV triple negative invasive mammary carcinoma of the right breast T3 N1 M1 with metastases to hilum and subcarinal lymph nodes and most recently disease progression in the lung. He is here for on treatment assessment prior to cycle 3-day 1 of gemcitabine single agent chemotherapy  Patient was receiving carboplatin and gemcitabine chemotherapy but her counts were not holding up despite Neulasta.  She was also having worsening thrombocytopenia.  Plan is therefore to hold off on carboplatin and continue with single agent gemcitabine.  She did have labs at [facility] which showed mild number cytopenia of 125 but her ANC was greater than 1.  She can proceed with gemcitabine at 800 mg per metered squared per day.  She will directly proceed for day 8 chemotherapy next week with onto Neulasta support and I will see her back in 3 weeks for cycle 4-day 1 of gemcitabine.  I will obtain CT chest abdomen and pelvis with contrast prior   Visit Diagnosis 1. Malignant neoplasm of upper-outer quadrant of right breast in female, estrogen receptor negative ([place])   2. Encounter for antineoplastic chemotherapy      Dr. Randa Evens, MD, MPH [facility] [phone] 03/23/2019 6:10 PM

## 2019-03-24 ENCOUNTER — Encounter: Payer: Self-pay | Admitting: *Deleted

## 2019-03-26 ENCOUNTER — Encounter: Payer: Self-pay | Admitting: Oncology

## 2019-03-26 ENCOUNTER — Other Ambulatory Visit: Payer: Self-pay

## 2019-03-27 ENCOUNTER — Inpatient Hospital Stay: Payer: Managed Care, Other (non HMO)

## 2019-03-27 ENCOUNTER — Other Ambulatory Visit: Payer: Self-pay

## 2019-03-27 VITALS — BP 112/79 | HR 98 | Temp 97.2°F | Resp 18

## 2019-03-27 DIAGNOSIS — C50411 Malignant neoplasm of upper-outer quadrant of right female breast: Secondary | ICD-10-CM

## 2019-03-27 DIAGNOSIS — Z171 Estrogen receptor negative status [ER-]: Secondary | ICD-10-CM

## 2019-03-27 DIAGNOSIS — Z5111 Encounter for antineoplastic chemotherapy: Secondary | ICD-10-CM | POA: Diagnosis not present

## 2019-03-27 MED ORDER — SODIUM CHLORIDE 0.9 % IV SOLN
1600.0000 mg | Freq: Once | INTRAVENOUS | Status: AC
  Administered 2019-03-27: 1600 mg via INTRAVENOUS
  Filled 2019-03-27: qty 26.3

## 2019-03-27 MED ORDER — SODIUM CHLORIDE 0.9 % IV SOLN
Freq: Once | INTRAVENOUS | Status: AC
  Administered 2019-03-27: 09:00:00 via INTRAVENOUS
  Filled 2019-03-27: qty 250

## 2019-03-27 MED ORDER — PEGFILGRASTIM 6 MG/0.6ML ~~LOC~~ PSKT
6.0000 mg | PREFILLED_SYRINGE | Freq: Once | SUBCUTANEOUS | Status: AC
  Administered 2019-03-27: 6 mg via SUBCUTANEOUS
  Filled 2019-03-27: qty 0.6

## 2019-03-27 MED ORDER — PROCHLORPERAZINE MALEATE 10 MG PO TABS
10.0000 mg | ORAL_TABLET | Freq: Once | ORAL | Status: AC
  Administered 2019-03-27: 10 mg via ORAL
  Filled 2019-03-27: qty 1

## 2019-03-27 MED ORDER — HEPARIN SOD (PORK) LOCK FLUSH 100 UNIT/ML IV SOLN
500.0000 [IU] | Freq: Once | INTRAVENOUS | Status: AC
  Administered 2019-03-27: 500 [IU] via INTRAVENOUS

## 2019-03-27 MED ORDER — PALONOSETRON HCL INJECTION 0.25 MG/5ML: 0.2500 mg | Freq: Once | INTRAVENOUS | Status: DC

## 2019-03-28 ENCOUNTER — Ambulatory Visit: Payer: Managed Care, Other (non HMO)

## 2019-04-04 ENCOUNTER — Ambulatory Visit
Admission: RE | Admit: 2019-04-04 | Discharge: 2019-04-04 | Disposition: A | Discharge status: Home / Self Care | Payer: Managed Care, Other (non HMO) | Source: Ambulatory Visit | Attending: Oncology | Admitting: Oncology

## 2019-04-04 ENCOUNTER — Other Ambulatory Visit: Payer: Self-pay

## 2019-04-04 DIAGNOSIS — C7800 Secondary malignant neoplasm of unspecified lung: Secondary | ICD-10-CM | POA: Diagnosis present

## 2019-04-04 DIAGNOSIS — C50911 Malignant neoplasm of unspecified site of right female breast: Secondary | ICD-10-CM | POA: Insufficient documentation

## 2019-04-04 MED ORDER — IOHEXOL 300 MG/ML  SOLN
100.0000 mL | Freq: Once | INTRAMUSCULAR | Status: AC | PRN
  Administered 2019-04-04: 100 mL via INTRAVENOUS

## 2019-04-07 ENCOUNTER — Encounter: Payer: Self-pay | Admitting: Oncology

## 2019-04-09 ENCOUNTER — Other Ambulatory Visit: Payer: Self-pay

## 2019-04-09 ENCOUNTER — Encounter: Payer: Self-pay | Admitting: Oncology

## 2019-04-09 ENCOUNTER — Encounter: Payer: Self-pay | Admitting: Pulmonary Disease

## 2019-04-09 ENCOUNTER — Ambulatory Visit: Payer: Managed Care, Other (non HMO) | Admitting: Pulmonary Disease

## 2019-04-09 VITALS — BP 106/62 | HR 100 | Temp 97.8°F | Ht 63.0 in | Wt 197.8 lb

## 2019-04-09 DIAGNOSIS — J9611 Chronic respiratory failure with hypoxia: Secondary | ICD-10-CM

## 2019-04-09 DIAGNOSIS — J189 Pneumonia, unspecified organism: Secondary | ICD-10-CM

## 2019-04-09 DIAGNOSIS — R0602 Shortness of breath: Secondary | ICD-10-CM

## 2019-04-09 DIAGNOSIS — D638 Anemia in other chronic diseases classified elsewhere: Secondary | ICD-10-CM

## 2019-04-09 DIAGNOSIS — C50911 Malignant neoplasm of unspecified site of right female breast: Secondary | ICD-10-CM

## 2019-04-09 DIAGNOSIS — E669 Obesity, unspecified: Secondary | ICD-10-CM

## 2019-04-09 MED ORDER — ARNUITY ELLIPTA 100 MCG/ACT IN AEPB: 1.0000 | INHALATION_SPRAY | Freq: Every day | RESPIRATORY_TRACT | 3 refills | Status: DC

## 2019-04-09 NOTE — Patient Instructions (Signed)
1.  He will need oxygen when ambulating.  We will try to provide you with a portable oxygen concentrator.  2.  Primary shortness of breath is due to anemia.  We are also checking your heart to make sure there is no heart failure.  You do have some evidence of fluid around your lungs and some swelling about the ankles today.  3.  We will start Arnuity Ellipta 1 inhalation daily this is a steroid inhaler this is trying to spare you taking oral steroids.  4.  We will see you in follow-up in 2 to 3 weeks time call sooner should any new difficulties arise.

## 2019-04-09 NOTE — Progress Notes (Signed)
Patient is coming in for follow up she is doing well she is waiting for test results.

## 2019-04-09 NOTE — Progress Notes (Signed)
 Assessment & Plan:  1. SOB (shortness of breath) (Primary) - ECHOCARDIOGRAM COMPLETE; Future  2. 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 Anemia of chronic illness  4. Pneumonitis  5. Obesity, Class II, BMI 35-39.9  6. Chronic respiratory failure with hypoxia [facility])   Patient Instructions  1.  He will need oxygen when ambulating.  We will try to provide you with a portable oxygen concentrator.  2.  Primary shortness of breath is due to anemia.  We are also checking your heart to make sure there is no heart failure.  You do have some evidence of fluid around your lungs and some swelling about the ankles today.  3.  We will start Arnuity Ellipta  1 inhalation daily this is a steroid inhaler this is trying to spare you taking oral steroids.  4.  We will see you in follow-up in 2 to 3 weeks time call sooner should any new difficulties arise.  Please note: late entry documentation due to logistical difficulties during COVID-19 pandemic. This note is filed for information purposes only, and is not intended to be used for billing, nor does it represent the full scope/nature of the visit in question. Please see any associated scanned media linked to date of encounter for additional pertinent information.  Subjective:    HPI: Ashley Molina is a 58 y.o. female presenting to the pulmonology clinic on 04/09/2019 with report of: Follow-up (pt reports of sob with exertion and dry cough mainly in the morning and after exercise. )     Outpatient Encounter Medications as of 04/09/2019  Medication Sig   Biotin  2500 MCG CAPS Take 5,000 mcg/day by mouth at bedtime.    Cholecalciferol  (VITAMIN D3) 10000 units TABS Take 1 tablet by mouth at bedtime.    Multiple Vitamins-Minerals (CENTRUM SILVER  ADULT 50+) TABS Take 1 tablet by mouth at bedtime.    [DISCONTINUED] benzonatate  (TESSALON  PERLES) 100 MG capsule Take 2 capsules (200 mg total) by mouth 3 (three) times daily. (Patient  taking differently: Take 200 mg by mouth 3 (three) times daily as needed. )   [DISCONTINUED] HYDROcodone -homatropine (HYCODAN) 5-1.5 MG/5ML syrup Take 5 mLs by mouth every 6 (six) hours as needed for cough. (Patient not taking: Reported on 08/11/2019)   [DISCONTINUED] omega-3 acid ethyl esters (LOVAZA ) 1 g capsule Take by mouth 1 day or 1 dose.   [DISCONTINUED] predniSONE  (DELTASONE ) 20 MG tablet Take 1 tablet (20 mg total) by mouth as directed. 60 mg x 7 days, 50 mg x 7 days, 40 mg x 7 days, 30 mg x 7 days, 20 mg x 7 days, 10 mg x 7 days. Each day take with food   [DISCONTINUED] Fluticasone  Furoate (ARNUITY ELLIPTA ) 100 MCG/ACT AEPB Inhale 1 puff into the lungs daily.   [DISCONTINUED] pantoprazole  (PROTONIX ) 20 MG tablet Take 1 tablet (20 mg total) by mouth daily.   [DISCONTINUED] pantoprazole  (PROTONIX ) 20 MG tablet Take 1 tablet (20 mg total) by mouth daily. (Patient not taking: Reported on 03/06/2019)   [DISCONTINUED] prochlorperazine  (COMPAZINE ) 10 MG tablet Take 1 tablet (10 mg total) by mouth every 6 (six) hours as needed (Nausea or vomiting).   [DISCONTINUED] Tbo-Filgrastim  (GRANIX ) injection 480 mcg    No facility-administered encounter medications on file as of 04/09/2019.      Objective:   Vitals:   04/09/19 0901  BP: 106/62  Pulse: 100  Temp: 97.8 F (36.6 C)  Height: 5' 3 (1.6 m)  Weight: 197 lb 12.8 oz (  89.7 kg)  SpO2: 95%  TempSrc: Temporal  BMI (Calculated): 35.05     Physical exam documentation is limited by delayed entry of information.

## 2019-04-10 ENCOUNTER — Inpatient Hospital Stay: Payer: Managed Care, Other (non HMO) | Attending: Oncology | Admitting: Oncology

## 2019-04-10 ENCOUNTER — Ambulatory Visit: Payer: Managed Care, Other (non HMO) | Admitting: Internal Medicine

## 2019-04-10 ENCOUNTER — Encounter: Payer: Self-pay | Admitting: *Deleted

## 2019-04-10 ENCOUNTER — Other Ambulatory Visit: Payer: Self-pay | Admitting: *Deleted

## 2019-04-10 ENCOUNTER — Inpatient Hospital Stay: Payer: Managed Care, Other (non HMO)

## 2019-04-10 ENCOUNTER — Other Ambulatory Visit: Payer: Self-pay

## 2019-04-10 VITALS — BP 119/79 | HR 125 | Temp 98.1°F | Wt 199.0 lb

## 2019-04-10 DIAGNOSIS — Z923 Personal history of irradiation: Secondary | ICD-10-CM | POA: Diagnosis not present

## 2019-04-10 DIAGNOSIS — R5383 Other fatigue: Secondary | ICD-10-CM | POA: Insufficient documentation

## 2019-04-10 DIAGNOSIS — Z5189 Encounter for other specified aftercare: Secondary | ICD-10-CM | POA: Insufficient documentation

## 2019-04-10 DIAGNOSIS — R600 Localized edema: Secondary | ICD-10-CM | POA: Diagnosis not present

## 2019-04-10 DIAGNOSIS — Z171 Estrogen receptor negative status [ER-]: Secondary | ICD-10-CM

## 2019-04-10 DIAGNOSIS — Z7189 Other specified counseling: Secondary | ICD-10-CM

## 2019-04-10 DIAGNOSIS — J9621 Acute and chronic respiratory failure with hypoxia: Secondary | ICD-10-CM | POA: Diagnosis not present

## 2019-04-10 DIAGNOSIS — J90 Pleural effusion, not elsewhere classified: Secondary | ICD-10-CM | POA: Diagnosis not present

## 2019-04-10 DIAGNOSIS — C50411 Malignant neoplasm of upper-outer quadrant of right female breast: Secondary | ICD-10-CM | POA: Diagnosis present

## 2019-04-10 DIAGNOSIS — R0602 Shortness of breath: Secondary | ICD-10-CM | POA: Insufficient documentation

## 2019-04-10 DIAGNOSIS — I700 Atherosclerosis of aorta: Secondary | ICD-10-CM | POA: Diagnosis not present

## 2019-04-10 DIAGNOSIS — Z79899 Other long term (current) drug therapy: Secondary | ICD-10-CM | POA: Diagnosis not present

## 2019-04-10 DIAGNOSIS — Z5111 Encounter for antineoplastic chemotherapy: Secondary | ICD-10-CM | POA: Diagnosis present

## 2019-04-10 DIAGNOSIS — C7800 Secondary malignant neoplasm of unspecified lung: Secondary | ICD-10-CM | POA: Insufficient documentation

## 2019-04-10 DIAGNOSIS — J984 Other disorders of lung: Secondary | ICD-10-CM | POA: Diagnosis not present

## 2019-04-10 MED ORDER — SODIUM CHLORIDE 0.9 % IV SOLN
Freq: Once | INTRAVENOUS | Status: AC
  Administered 2019-04-10: 11:00:00 via INTRAVENOUS
  Filled 2019-04-10: qty 250

## 2019-04-10 MED ORDER — SODIUM CHLORIDE 0.9 % IV SOLN
1600.0000 mg | Freq: Once | INTRAVENOUS | Status: AC
  Administered 2019-04-10: 1600 mg via INTRAVENOUS
  Filled 2019-04-10: qty 26.3

## 2019-04-10 MED ORDER — PANTOPRAZOLE SODIUM 20 MG PO TBEC: 20.0000 mg | DELAYED_RELEASE_TABLET | Freq: Every day | ORAL | 0 refills | Status: DC

## 2019-04-10 MED ORDER — PALONOSETRON HCL INJECTION 0.25 MG/5ML
0.2500 mg | Freq: Once | INTRAVENOUS | Status: AC
  Administered 2019-04-10: 11:00:00 0.25 mg via INTRAVENOUS
  Filled 2019-04-10: qty 5

## 2019-04-10 MED ORDER — PREDNISONE 20 MG PO TABS: 20.0000 mg | ORAL_TABLET | Freq: Every day | ORAL | 0 refills | Status: DC

## 2019-04-10 MED ORDER — PROCHLORPERAZINE MALEATE 10 MG PO TABS
10.0000 mg | ORAL_TABLET | Freq: Once | ORAL | Status: AC
  Administered 2019-04-10: 10 mg via ORAL
  Filled 2019-04-10: qty 1

## 2019-04-10 MED ORDER — HEPARIN SOD (PORK) LOCK FLUSH 100 UNIT/ML IV SOLN
500.0000 [IU] | Freq: Once | INTRAVENOUS | Status: AC | PRN
  Administered 2019-04-10: 500 [IU]
  Filled 2019-04-10: qty 5

## 2019-04-10 NOTE — Progress Notes (Signed)
Per Dr. Janese Banks, okay to proceed with Gemzar treatment with HR of 125, no ON PRO today.

## 2019-04-11 ENCOUNTER — Other Ambulatory Visit: Payer: Self-pay | Admitting: *Deleted

## 2019-04-11 ENCOUNTER — Encounter: Payer: Self-pay | Admitting: Oncology

## 2019-04-11 MED ORDER — SULFAMETHOXAZOLE-TRIMETHOPRIM 800-160 MG PO TABS: 1.0000 | ORAL_TABLET | ORAL | 0 refills | Status: DC

## 2019-04-11 NOTE — Progress Notes (Signed)
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 Her only site of metastatic disease was biopsy-proven hilar adenopathy. She was seen for second opinion at [facility] ([place]) and underwent neoadjuvant chemotherapy with carbotaxol followed by dose dense AC. Posttreatment scans showed excellent response to treatment with resolution of hilar adenopathy as well as axillary adenopathy but persistent primary tumor at the right breast. She then went on to get right-sided lumpectomy along with axillary lymph node dissection. Final pathology showed 16 mm residual tumor. 0/12 Ln positive for malignancy. Grade 2. Negative margins. No LVI. ER, PR and her 2 neu negative. She also completed radiation therapy to her chest wall and hilar region at in August 2019. She then completed adjuvant xeloda.Patient also had radiation pneumonitis and was treated with a prolonged course of steroids by radiation oncology at [facility].  Patient has had baseline foundation 1 testing done which did not show any evidence of actionable mutations. PDL 1 was 0%. She also had strata testing done at [facility] which did not show any actionable mutations.  Repeat PET CT  scan on 08/29/2018 showed new enlarged hypermetabolic right paratracheal and subcarinal and AP window as well as right hilar lymph nodes compatible with nodal metastatic disease. New and enlarging hypermetabolic irregular solid pulmonary nodules consistent with pulmonary metastases.Eribulin was started in April 2020 but patient had quick disease progression and has been switched to carboplatin and gemcitabine.  Patient had significant neutropenia and thrombocytopenia despite giving Neulasta and therefore carboplatin had to be dropped  Interval history-patient feels that her shortness of breath is steadily getting worse and she is back to using oxygen mainly on ambulation.  Feels fatigued  ECOG PS- 1 Pain scale- 0   Review of systems- Review of Systems  Constitutional: Positive for malaise/fatigue. Negative for chills, fever and weight loss.  HENT: Negative for congestion, ear discharge and nosebleeds.   Eyes: Negative for blurred vision.  Respiratory: Positive for shortness of breath. Negative for cough, hemoptysis, sputum production and wheezing.   Cardiovascular: Negative for chest pain, palpitations, orthopnea and claudication.  Gastrointestinal: Negative for abdominal pain, blood in stool, constipation, diarrhea, heartburn, melena, nausea and vomiting.  Genitourinary: Negative for dysuria, flank pain, frequency, hematuria and urgency.  Musculoskeletal: Negative for back pain, joint pain and myalgias.  Skin: Negative for rash.  Neurological: Negative for dizziness, tingling, focal weakness, seizures, weakness and headaches.  Endo/Heme/Allergies: Does not bruise/bleed easily.  Psychiatric/Behavioral: Negative for depression and suicidal ideas. The patient does not have insomnia.       Allergies  Allergen Reactions   Penicillins Rash     Past Medical History:  Diagnosis Date   Anemia    Breast cancer ([place])    Breast cancer, right ([place]) 04/2017   Hx Lumpectomy, Chemo + Rad  tx's.   Breast cyst,  right    aspirated by Dr. Bary Castilla   Hyperlipidemia    Personal history of chemotherapy    Pre-diabetes      Past Surgical History:  Procedure Laterality Date   AXILLARY LYMPH NODE BIOPSY Right 04/09/2017   Procedure: AXILLARY LYMPH NODE BIOPSY;  Surgeon: Robert Bellow, MD;  Location: [facility] ORS;  Service: General;  Laterality: Right;   BREAST BIOPSY Right 03/27/2017   US guided breast mass - invasive mammary carcinoma   BREAST BIOPSY Right 03/27/2017   Lymph node - metastatic carcinoma   BREAST BIOPSY Right 04/09/2017   Lymph node    BREAST CYST ASPIRATION Right 11/2009   Dr. Bary Castilla did FNA   BREAST EXCISIONAL BIOPSY Right 09/28/2017   lumpectomy and 12 lympnode rad neo adj chemo   COLONOSCOPY  2011   DILATION AND CURETTAGE OF UTERUS     X3   ENDOBRONCHIAL ULTRASOUND N/A 04/09/2017   Procedure: ENDOBRONCHIAL ULTRASOUND;  Surgeon: Laverle Hobby, MD;  Location: [facility] ORS;  Service: Pulmonary;  Laterality: N/A;   ENDOMETRIAL ABLATION     ENDOMETRIAL BIOPSY  09/2009   PORTACATH PLACEMENT Left 04/09/2017   Procedure: INSERTION PORT-A-CATH;  Surgeon: Robert Bellow, MD;  Location: [facility] ORS;  Service: General;  Laterality: Left;    Social History   Socioeconomic History   Marital status: Married    Spouse name: Not on file   Number of children: Not on file   Years of education: Not on file   Highest education level: Not on file  Occupational History   Not on file  Social Needs   Financial resource strain: Not on file   Food insecurity    Worry: Not on file    Inability: Not on file   Transportation needs    Medical: Not on file    Non-medical: Not on file  Tobacco Use   Smoking status: Never Smoker   Smokeless tobacco: Never Used  Substance and Sexual Activity   Alcohol use: Not Currently   Drug use: No   Sexual activity: Not Currently  Lifestyle   Physical activity    Days per week: Not on file     Minutes per session: Not on file   Stress: Not on file  Relationships   Social connections    Talks on phone: Not on file    Gets together: Not on file    Attends religious service: Not on file    Active member of club or organization: Not on file    Attends meetings of clubs or organizations: Not on file    Relationship status: Not on file   Intimate partner violence    Fear of current or ex partner: Not on file    Emotionally abused: Not on file    Physically abused: Not on file    Forced sexual activity: Not on file  Other Topics Concern   Not on file  Social History Narrative   Not on file    Family History  Problem Relation Age of Onset   Melanoma Maternal Grandmother 98       currently 8   Brain cancer Maternal Grandfather 57       unk. type; deceased in 11s   Melanoma Other 54       mat grandmother's father   Melanoma Father 36       on head; currently 110   Prostate cancer Maternal Uncle        3 maternal uncles; dx in  25s   Breast cancer Other        mat grandfather's sister; dx 28s     Current Outpatient Medications:    benzonatate (TESSALON PERLES) 100 MG capsule, Take 2 capsules (200 mg total) by mouth 3 (three) times daily., Disp: 90 capsule, Rfl: 2   Biotin 2500 MCG CAPS, Take 5,000 mcg/day by mouth daily., Disp: , Rfl:    Cholecalciferol (VITAMIN D3) 10000 units TABS, Take by mouth 1 day or 1 dose., Disp: , Rfl:    Ferrous Sulfate (IRON) 325 (65 Fe) MG TABS, Take 1 tablet by mouth daily., Disp: , Rfl:    Fluticasone Furoate (ARNUITY ELLIPTA) 100 MCG/ACT AEPB, Inhale 1 puff into the lungs daily., Disp: 30 each, Rfl: 3   HYDROcodone-homatropine (HYCODAN) 5-1.5 MG/5ML syrup, Take 5 mLs by mouth every 6 (six) hours as needed for cough., Disp: 240 mL, Rfl: 0   Multiple Vitamins-Minerals (CENTRUM SILVER ADULT 50+) TABS, Take 1 tablet by mouth daily., Disp: , Rfl:    pantoprazole (PROTONIX) 20 MG tablet, Take 1 tablet (20 mg total) by mouth  daily., Disp: 30 tablet, Rfl: 0   predniSONE (DELTASONE) 20 MG tablet, Take 1 tablet (20 mg total) by mouth daily with breakfast., Disp: 30 tablet, Rfl: 0 No current facility-administered medications for this visit.   Facility-Administered Medications Ordered in Other Visits:    Tbo-Filgrastim (GRANIX) injection 480 mcg, 480 mcg, Subcutaneous, Daily, Sindy Guadeloupe, MD, 480 mcg at 05/19/17 1412  Physical exam:  Vitals:   04/10/19 0934  BP: 119/79  Pulse: (!) 125  Temp: 98.1 F (36.7 C)  TempSrc: Tympanic  SpO2: 98%  Weight: 199 lb (90.3 kg)   Physical Exam Constitutional:      General: She is not in acute distress.    Comments: She is on continuous oxygen  HENT:     Head: Normocephalic and atraumatic.  Eyes:     Pupils: Pupils are equal, round, and reactive to light.  Neck:     Musculoskeletal: Normal range of motion.  Cardiovascular:     Rate and Rhythm: Regular rhythm. Tachycardia present.     Heart sounds: Normal heart sounds.  Pulmonary:     Effort: Pulmonary effort is normal.     Comments: Breath sounds decreased at the bases Abdominal:     General: Bowel sounds are normal.     Palpations: Abdomen is soft.  Musculoskeletal:     Comments: Bilateral +1 edema  Skin:    General: Skin is warm and dry.  Neurological:     Mental Status: She is alert and oriented to person, place, and time.      CMP Latest Ref Rng & Units 01/20/2019  Glucose 70 - 99 mg/dL 110(H)  BUN 6 - 20 mg/dL 19  Creatinine 0.44 - 1.00 mg/dL 0.84  Sodium 135 - 145 mmol/L 138  Potassium 3.5 - 5.1 mmol/L 3.8  Chloride 98 - 111 mmol/L 104  CO2 22 - 32 mmol/L 25  Calcium 8.9 - 10.3 mg/dL 9.7  Total Protein 6.5 - 8.1 g/dL 6.3(L)  Total Bilirubin 0.3 - 1.2 mg/dL 0.7  Alkaline Phos 38 - 126 U/L 93  AST 15 - 41 U/L 21  ALT 0 - 44 U/L 19   CBC Latest Ref Rng & Units 01/20/2019  WBC 4.0 - 10.5 K/uL 10.0  Hemoglobin 12.0 - 15.0 g/dL 12.4  Hematocrit 36.0 - 46.0 % 38.7  Platelets 150 - 400 K/uL  207    No images are  attached to the encounter.  Ct Chest W Contrast  Result Date: 04/04/2019 CLINICAL DATA:  Restaging breast cancer EXAM: CT CHEST, ABDOMEN, AND PELVIS WITH CONTRAST TECHNIQUE: Multidetector CT imaging of the chest, abdomen and pelvis was performed following the standard protocol during bolus administration of intravenous contrast. CONTRAST:  129m OMNIPAQUE IOHEXOL 300 MG/ML SOLN, additional oral enteric contrast COMPARISON:  01/02/2019, PET-CT, 08/29/2018 FINDINGS: CT CHEST FINDINGS Cardiovascular: Left chest port catheter. Normal heart size. No pericardial effusion. Mediastinum/Nodes: No enlarged mediastinal, hilar, or axillary lymph nodes. Thyroid gland, trachea, and esophagus demonstrate no significant findings. Lungs/Pleura: Unchanged dense perihilar post treatment fibrosis and consolidation of the right lung (series 4, image 70). There has been interval resolution of most of the previously seen subpleural consolidation and diffusely scattered ground-glass pulmonary opacity, however there are innumerable new small pulmonary nodules, which appear to have a random distribution including many abutting the fissures and pleura. There is mild bilateral interlobular septal thickening, most conspicuous in the lung bases. Small right, trace left pleural effusions. Musculoskeletal: Status post right lumpectomy with skin thickening of the right breast. Suspicious bone lesions identified. CT ABDOMEN PELVIS FINDINGS Hepatobiliary: No solid liver abnormality is seen. No gallstones, gallbladder wall thickening, or biliary dilatation. Pancreas: Unremarkable. No pancreatic ductal dilatation or surrounding inflammatory changes. Spleen: Normal in size without significant abnormality. Adrenals/Urinary Tract: Adrenal glands are unremarkable. Kidneys are normal, without renal calculi, solid lesion, or hydronephrosis. Bladder is unremarkable. Stomach/Bowel: Stomach is within normal limits. Appendix appears  normal. No evidence of bowel wall thickening, distention, or inflammatory changes. Vascular/Lymphatic: Aortic atherosclerosis. No enlarged abdominal or pelvic lymph nodes. Reproductive: No mass or other abnormality. Other: No abdominal wall hernia or abnormality. No abdominopelvic ascites. Musculoskeletal: No acute or significant osseous findings. IMPRESSION: 1. Unchanged post treatment consolidation and fibrosis of a right hilar lung mass. 2. There has been interval resolution of most of the previously seen subpleural consolidation and diffusely scattered ground-glass pulmonary opacity, consistent with resolution of infection or inflammation, including drug toxicity. 3. There are innumerable new small pulmonary nodules, which appear to have a random distribution including many abutting the fissures and pleura. There is mild bilateral interlobular septal thickening, most conspicuous in the lung bases. Findings are most suspicious for metastatic disease, however unusually profligate atypical infection could have this appearance. 4.  Small right, trace left pleural effusions. 5. Status post right lumpectomy with skin thickening of the right breast. 6.  No evidence of metastatic disease in the abdomen or pelvis. 7.  Aortic Atherosclerosis (ICD10-I70.0). Electronically Signed   By: AEddie CandleM.D.   On: 04/04/2019 13:34   Ct Abdomen Pelvis W Contrast  Result Date: 04/04/2019 CLINICAL DATA:  Restaging breast cancer EXAM: CT CHEST, ABDOMEN, AND PELVIS WITH CONTRAST TECHNIQUE: Multidetector CT imaging of the chest, abdomen and pelvis was performed following the standard protocol during bolus administration of intravenous contrast. CONTRAST:  1066mOMNIPAQUE IOHEXOL 300 MG/ML SOLN, additional oral enteric contrast COMPARISON:  01/02/2019, PET-CT, 08/29/2018 FINDINGS: CT CHEST FINDINGS Cardiovascular: Left chest port catheter. Normal heart size. No pericardial effusion. Mediastinum/Nodes: No enlarged mediastinal,  hilar, or axillary lymph nodes. Thyroid gland, trachea, and esophagus demonstrate no significant findings. Lungs/Pleura: Unchanged dense perihilar post treatment fibrosis and consolidation of the right lung (series 4, image 70). There has been interval resolution of most of the previously seen subpleural consolidation and diffusely scattered ground-glass pulmonary opacity, however there are innumerable new small pulmonary nodules, which appear to have a random distribution including many abutting the fissures  and pleura. There is mild bilateral interlobular septal thickening, most conspicuous in the lung bases. Small right, trace left pleural effusions. Musculoskeletal: Status post right lumpectomy with skin thickening of the right breast. Suspicious bone lesions identified. CT ABDOMEN PELVIS FINDINGS Hepatobiliary: No solid liver abnormality is seen. No gallstones, gallbladder wall thickening, or biliary dilatation. Pancreas: Unremarkable. No pancreatic ductal dilatation or surrounding inflammatory changes. Spleen: Normal in size without significant abnormality. Adrenals/Urinary Tract: Adrenal glands are unremarkable. Kidneys are normal, without renal calculi, solid lesion, or hydronephrosis. Bladder is unremarkable. Stomach/Bowel: Stomach is within normal limits. Appendix appears normal. No evidence of bowel wall thickening, distention, or inflammatory changes. Vascular/Lymphatic: Aortic atherosclerosis. No enlarged abdominal or pelvic lymph nodes. Reproductive: No mass or other abnormality. Other: No abdominal wall hernia or abnormality. No abdominopelvic ascites. Musculoskeletal: No acute or significant osseous findings. IMPRESSION: 1. Unchanged post treatment consolidation and fibrosis of a right hilar lung mass. 2. There has been interval resolution of most of the previously seen subpleural consolidation and diffusely scattered ground-glass pulmonary opacity, consistent with resolution of infection or  inflammation, including drug toxicity. 3. There are innumerable new small pulmonary nodules, which appear to have a random distribution including many abutting the fissures and pleura. There is mild bilateral interlobular septal thickening, most conspicuous in the lung bases. Findings are most suspicious for metastatic disease, however unusually profligate atypical infection could have this appearance. 4.  Small right, trace left pleural effusions. 5. Status post right lumpectomy with skin thickening of the right breast. 6.  No evidence of metastatic disease in the abdomen or pelvis. 7.  Aortic Atherosclerosis (ICD10-I70.0). Electronically Signed   By: Eddie Candle M.D.   On: 04/04/2019 13:34   US Venous Img Upper Uni Right  Result Date: 03/13/2019 CLINICAL DATA:  Right upper extremity edema. History of right-sided breast cancer with right axillary lymph node dissection. Evaluate for DVT. EXAM: RIGHT UPPER EXTREMITY VENOUS DOPPLER ULTRASOUND TECHNIQUE: Gray-scale sonography with graded compression, as well as color Doppler and duplex ultrasound were performed to evaluate the upper extremity deep venous system from the level of the subclavian vein and including the jugular, axillary, basilic, radial, ulnar and upper cephalic vein. Spectral Doppler was utilized to evaluate flow at rest and with distal augmentation maneuvers. COMPARISON:  None. FINDINGS: Contralateral Subclavian Vein: Respiratory phasicity is normal and symmetric with the symptomatic side. Port a catheter tubing is seen within mid aspect the left subclavian vein (image 4), however there is no associated pericatheter thrombosis. Normal compressibility. Internal Jugular Vein: No evidence of thrombus. Normal compressibility, respiratory phasicity and response to augmentation. Subclavian Vein: No evidence of thrombus. Normal compressibility, respiratory phasicity and response to augmentation. Axillary Vein: No evidence of thrombus. Normal  compressibility, respiratory phasicity and response to augmentation. Cephalic Vein: No evidence of thrombus. Normal compressibility, respiratory phasicity and response to augmentation. Basilic Vein: No evidence of thrombus. Normal compressibility, respiratory phasicity and response to augmentation. Brachial Veins: No evidence of thrombus. Normal compressibility, respiratory phasicity and response to augmentation. Radial Veins: No evidence of thrombus. Normal compressibility, respiratory phasicity and response to augmentation. Ulnar Veins: No evidence of thrombus. Normal compressibility, respiratory phasicity and response to augmentation. Venous Reflux:  None visualized. Other Findings:  None visualized. IMPRESSION: No evidence of DVT within the right upper extremity. Electronically Signed   By: Sandi Mariscal M.D.   On: 03/13/2019 12:20     Assessment and plan- Patient is a 58 y.o. female with stage IV triple negative breast cancer with metastases to  the lungs and mediastinal adenopathy.  She is here to discuss results of CT scan on treatment assessment prior to cycle 4-day 1 of gemcitabine  I have reviewed CT chest abdomen pelvis images independently and discussed findings with the patient.  Patient has evidence of chronic lung disease due to posttreatment fibrosis and prior pneumonitis.  Patient was found to have innumerable subpleural consolidation and groundglass opacities back in July 2020.  She was empirically treated for pneumonitis with a tapering course of steroids and these opacities have resolved.  However we now see multiple small pulmonary nodules along the fissures and pleura.  It is unclear if these nodules represent worsening malignancy versus possible flareup of pneumonitis.  Patient did have a dramatic response to steroids previously with resolution of her respiratory failure and she was able to come off oxygen.  I did discuss her case with Dr. Patsey Berthold who had seen the patient yesterday.  She  is concerned that partly her shortness of breath is secondary to her anemia and she would like to rule out a cardiac cause as well for which an echocardiogram is ordered which I agree with.  However significant part of her hypoxic respiratory failure is due to the lung findings.  Ideally she would need a bronchoscopy to differentiate if this is malignancy versus pneumonitis.  However she is on 2 to 3 L of oxygen and per my discussion with Dr. Patsey Berthold bronchoscopy would be a high risk procedure at this time and given the scattered distribution of the small nodules it is unclear if this bronchoscopy would be diagnostic.  We will recheck with the patient and start her on prednisone 20 mg along with Protonix without taper and see how she responds to it.  I will also discuss her case at tumor board next week.  She was found to have matted mediastinal and is azgoesophageal recess lymph nodes back in July which was not as prominent presently indicating possible response to treatment.  I am therefore not inclined to change her present course of chemotherapy at this time.  Treatment is being given with a palliative intent  I have reviewed her labs done at [facility] on 04/18/2019 and counts are acceptable to proceed with cycle 4 day 1 of single agent gemcitabine today.  She will directly proceed for cycle 4-day 8 of gemcitabine in 1 week and receive 1 for Neulasta on that day.  I will see her back in 3 weeks for cycle 5-day 1 of gemcitabine   Total face to face encounter time for this patient visit was 40 min. >50% of the time was  spent in counseling and coordination of care.     Visit Diagnosis 1. Acute on chronic respiratory failure with hypoxia (HCC)   2. Encounter for antineoplastic chemotherapy   3. Malignant neoplasm of upper-outer quadrant of right breast in female, estrogen receptor negative ([place])      Dr. Randa Evens, MD, MPH [facility] [phone] 04/11/2019 8:50  AM

## 2019-04-16 ENCOUNTER — Other Ambulatory Visit: Payer: Self-pay

## 2019-04-16 ENCOUNTER — Encounter: Payer: Self-pay | Admitting: Oncology

## 2019-04-17 ENCOUNTER — Other Ambulatory Visit: Payer: Self-pay | Admitting: Oncology

## 2019-04-17 ENCOUNTER — Other Ambulatory Visit: Payer: Self-pay

## 2019-04-17 ENCOUNTER — Inpatient Hospital Stay: Payer: Managed Care, Other (non HMO)

## 2019-04-17 VITALS — BP 113/73 | HR 99 | Temp 96.5°F | Resp 18 | Wt 199.0 lb

## 2019-04-17 DIAGNOSIS — Z5111 Encounter for antineoplastic chemotherapy: Secondary | ICD-10-CM | POA: Diagnosis not present

## 2019-04-17 DIAGNOSIS — C50411 Malignant neoplasm of upper-outer quadrant of right female breast: Secondary | ICD-10-CM

## 2019-04-17 DIAGNOSIS — Z171 Estrogen receptor negative status [ER-]: Secondary | ICD-10-CM

## 2019-04-17 MED ORDER — HEPARIN SOD (PORK) LOCK FLUSH 100 UNIT/ML IV SOLN
500.0000 [IU] | Freq: Once | INTRAVENOUS | Status: AC | PRN
  Administered 2019-04-17: 14:00:00 500 [IU]
  Filled 2019-04-17: qty 5

## 2019-04-17 MED ORDER — PROCHLORPERAZINE MALEATE 10 MG PO TABS
10.0000 mg | ORAL_TABLET | Freq: Once | ORAL | Status: AC
  Administered 2019-04-17: 10 mg via ORAL
  Filled 2019-04-17: qty 1

## 2019-04-17 MED ORDER — SODIUM CHLORIDE 0.9 % IV SOLN
Freq: Once | INTRAVENOUS | Status: AC
  Administered 2019-04-17: 13:00:00 via INTRAVENOUS
  Filled 2019-04-17: qty 250

## 2019-04-17 MED ORDER — PEGFILGRASTIM 6 MG/0.6ML ~~LOC~~ PSKT
6.0000 mg | PREFILLED_SYRINGE | Freq: Once | SUBCUTANEOUS | Status: AC
  Administered 2019-04-17: 14:00:00 6 mg via SUBCUTANEOUS
  Filled 2019-04-17: qty 0.6

## 2019-04-17 MED ORDER — SODIUM CHLORIDE 0.9 % IV SOLN
1600.0000 mg | Freq: Once | INTRAVENOUS | Status: AC
  Administered 2019-04-17: 14:00:00 1600 mg via INTRAVENOUS
  Filled 2019-04-17: qty 26.3

## 2019-04-17 NOTE — Progress Notes (Signed)
Tumor Board Documentation  Ashley Molina was presented by Dr Janese Banks at our Tumor Board on 04/17/2019, which included representatives from medical oncology, radiation oncology, pathology, radiology, surgical, navigation, internal medicine, pharmacy, palliative care, research.  Ashley Molina currently presents as a current patient, for discussion with history of the following treatments: adjuvant chemotherapy, adjuvant radiation.  Additionally, we reviewed previous medical and familial history, history of present illness, and recent lab results along with all available histopathologic and imaging studies. The tumor board considered available treatment options and made the following recommendations: Active surveillance    The following procedures/referrals were also placed: No orders of the defined types were placed in this encounter.   Clinical Trial Status: not discussed   Staging used: AJCC Stage Group  AJCC Staging:       Group: Stage 4 Breast cancer   National site-specific guidelines NCCN were discussed with respect to the case.  Tumor board is a meeting of clinicians from various specialty areas who evaluate and discuss patients for whom a multidisciplinary approach is being considered. Final determinations in the plan of care are those of the provider(s). The responsibility for follow up of recommendations given during tumor board is that of the provider.   Today's extended care, comprehensive team conference, Ashley Molina was not present for the discussion and was not examined.   Multidisciplinary Tumor Board is a multidisciplinary case peer review process.  Decisions discussed in the Multidisciplinary Tumor Board reflect the opinions of the specialists present at the conference without having examined the patient.  Ultimately, treatment and diagnostic decisions rest with the primary provider(s) and the patient.

## 2019-04-17 NOTE — Progress Notes (Signed)
Per Dr. Janese Banks proceed with Neulasta ON PRO as scheduled.

## 2019-04-22 ENCOUNTER — Ambulatory Visit (INDEPENDENT_AMBULATORY_CARE_PROVIDER_SITE_OTHER): Payer: Managed Care, Other (non HMO)

## 2019-04-22 ENCOUNTER — Other Ambulatory Visit: Payer: Self-pay

## 2019-04-22 DIAGNOSIS — R0602 Shortness of breath: Secondary | ICD-10-CM | POA: Diagnosis not present

## 2019-04-30 ENCOUNTER — Ambulatory Visit: Payer: Managed Care, Other (non HMO) | Admitting: Pulmonary Disease

## 2019-04-30 ENCOUNTER — Encounter: Payer: Self-pay | Admitting: Pulmonary Disease

## 2019-04-30 ENCOUNTER — Other Ambulatory Visit: Payer: Self-pay

## 2019-04-30 VITALS — BP 124/70 | HR 80 | Temp 98.0°F | Ht 63.0 in | Wt 198.0 lb

## 2019-04-30 DIAGNOSIS — D638 Anemia in other chronic diseases classified elsewhere: Secondary | ICD-10-CM

## 2019-04-30 DIAGNOSIS — R0602 Shortness of breath: Secondary | ICD-10-CM

## 2019-04-30 DIAGNOSIS — C50911 Malignant neoplasm of unspecified site of right female breast: Secondary | ICD-10-CM

## 2019-04-30 DIAGNOSIS — J189 Pneumonia, unspecified organism: Secondary | ICD-10-CM

## 2019-04-30 DIAGNOSIS — E669 Obesity, unspecified: Secondary | ICD-10-CM

## 2019-04-30 DIAGNOSIS — C7800 Secondary malignant neoplasm of unspecified lung: Secondary | ICD-10-CM

## 2019-04-30 NOTE — Patient Instructions (Signed)
1.  Continue prednisone but decrease to 10 mg (half a tablet) daily.  2.  Continue Arnuity Ellipta 100 mcg, 1 puff daily.  Remember to rinse your mouth well after use.  3.  We will see you in follow-up in 3 to 4 weeks time be aware that that may be a virtual visit depending on the COVID-19 situation.

## 2019-04-30 NOTE — Progress Notes (Signed)
    Assessment & Plan:  1. SOB (shortness of breath) (Primary)  2. 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 Anemia of chronic illness  4. Pneumonitis  5. Obesity, Class II, BMI 35-39.9   Patient Instructions  1.  Continue prednisone  but decrease to 10 mg (half a tablet) daily.  2.  Continue Arnuity Ellipta  100 mcg, 1 puff daily.  Remember to rinse your mouth well after use.  3.  We will see you in follow-up in 3 to 4 weeks time be aware that that may be a virtual visit depending on the COVID-19 situation.  Please note: late entry documentation due to logistical difficulties during COVID-19 pandemic. This note is filed for information purposes only, and is not intended to be used for billing, nor does it represent the full scope/nature of the visit in question. Please see any associated scanned media linked to date of encounter for additional pertinent information.  Subjective:    HPI: Ashley Molina is a 58 y.o. female presenting to the pulmonology clinic on 04/30/2019 with report of: Follow-up (breathing has imorved since last OV. c/o occ dry cough.)     Outpatient Encounter Medications as of 04/30/2019  Medication Sig   Biotin  2500 MCG CAPS Take 5,000 mcg/day by mouth at bedtime.    Cholecalciferol  (VITAMIN D3) 10000 units TABS Take 1 tablet by mouth at bedtime.    Ferrous Sulfate  (IRON) 325 (65 Fe) MG TABS Take 1 tablet by mouth at bedtime.    Multiple Vitamins-Minerals (CENTRUM SILVER  ADULT 50+) TABS Take 1 tablet by mouth at bedtime.    [DISCONTINUED] benzonatate  (TESSALON  PERLES) 100 MG capsule Take 2 capsules (200 mg total) by mouth 3 (three) times daily. (Patient taking differently: Take 200 mg by mouth 3 (three) times daily as needed. )   [DISCONTINUED] Fluticasone  Furoate (ARNUITY ELLIPTA ) 100 MCG/ACT AEPB Inhale 1 puff into the lungs daily.   [DISCONTINUED] HYDROcodone -homatropine (HYCODAN) 5-1.5 MG/5ML syrup Take 5 mLs by mouth every 6 (six)  hours as needed for cough. (Patient not taking: Reported on 08/11/2019)   [DISCONTINUED] pantoprazole  (PROTONIX ) 20 MG tablet Take 1 tablet (20 mg total) by mouth daily.   [DISCONTINUED] predniSONE  (DELTASONE ) 20 MG tablet Take 1 tablet (20 mg total) by mouth daily with breakfast. (Patient taking differently: Take 10 mg by mouth daily with breakfast. )   [DISCONTINUED] sulfamethoxazole -trimethoprim  (BACTRIM  DS) 800-160 MG tablet Take 1 tablet by mouth 3 (three) times a week.   [DISCONTINUED] pantoprazole  (PROTONIX ) 20 MG tablet Take 1 tablet (20 mg total) by mouth daily.   [DISCONTINUED] prochlorperazine  (COMPAZINE ) 10 MG tablet Take 1 tablet (10 mg total) by mouth every 6 (six) hours as needed (Nausea or vomiting).   [DISCONTINUED] Tbo-Filgrastim  (GRANIX ) injection 480 mcg    No facility-administered encounter medications on file as of 04/30/2019.      Objective:   Vitals:   04/30/19 0829  BP: 124/70  Pulse: 80  Temp: 98 F (36.7 C)  Height: 5' 3 (1.6 m)  Weight: 198 lb (89.8 kg)  SpO2: 100%  TempSrc: Temporal  BMI (Calculated): 35.08     Physical exam documentation is limited by delayed entry of information.

## 2019-05-06 ENCOUNTER — Other Ambulatory Visit: Payer: Self-pay

## 2019-05-06 ENCOUNTER — Encounter: Payer: Self-pay | Admitting: Oncology

## 2019-05-06 ENCOUNTER — Inpatient Hospital Stay: Payer: Managed Care, Other (non HMO) | Attending: Oncology | Admitting: Oncology

## 2019-05-06 ENCOUNTER — Inpatient Hospital Stay: Payer: Managed Care, Other (non HMO)

## 2019-05-06 VITALS — BP 118/65 | HR 78 | Temp 98.1°F | Ht 63.0 in | Wt 200.0 lb

## 2019-05-06 VITALS — Resp 18

## 2019-05-06 DIAGNOSIS — Z171 Estrogen receptor negative status [ER-]: Secondary | ICD-10-CM | POA: Diagnosis not present

## 2019-05-06 DIAGNOSIS — C771 Secondary and unspecified malignant neoplasm of intrathoracic lymph nodes: Secondary | ICD-10-CM | POA: Diagnosis not present

## 2019-05-06 DIAGNOSIS — Z803 Family history of malignant neoplasm of breast: Secondary | ICD-10-CM | POA: Diagnosis not present

## 2019-05-06 DIAGNOSIS — Z9221 Personal history of antineoplastic chemotherapy: Secondary | ICD-10-CM | POA: Diagnosis not present

## 2019-05-06 DIAGNOSIS — D84821 Immunodeficiency due to drugs: Secondary | ICD-10-CM | POA: Insufficient documentation

## 2019-05-06 DIAGNOSIS — C50411 Malignant neoplasm of upper-outer quadrant of right female breast: Secondary | ICD-10-CM

## 2019-05-06 DIAGNOSIS — T451X5A Adverse effect of antineoplastic and immunosuppressive drugs, initial encounter: Secondary | ICD-10-CM | POA: Insufficient documentation

## 2019-05-06 DIAGNOSIS — R509 Fever, unspecified: Secondary | ICD-10-CM | POA: Insufficient documentation

## 2019-05-06 DIAGNOSIS — R5383 Other fatigue: Secondary | ICD-10-CM | POA: Diagnosis not present

## 2019-05-06 DIAGNOSIS — D6959 Other secondary thrombocytopenia: Secondary | ICD-10-CM | POA: Diagnosis not present

## 2019-05-06 DIAGNOSIS — Z7952 Long term (current) use of systemic steroids: Secondary | ICD-10-CM | POA: Diagnosis not present

## 2019-05-06 DIAGNOSIS — Z79899 Other long term (current) drug therapy: Secondary | ICD-10-CM | POA: Insufficient documentation

## 2019-05-06 DIAGNOSIS — R0602 Shortness of breath: Secondary | ICD-10-CM | POA: Diagnosis not present

## 2019-05-06 DIAGNOSIS — R0989 Other specified symptoms and signs involving the circulatory and respiratory systems: Secondary | ICD-10-CM | POA: Diagnosis not present

## 2019-05-06 DIAGNOSIS — Z5111 Encounter for antineoplastic chemotherapy: Secondary | ICD-10-CM | POA: Diagnosis not present

## 2019-05-06 DIAGNOSIS — Z8701 Personal history of pneumonia (recurrent): Secondary | ICD-10-CM | POA: Insufficient documentation

## 2019-05-06 DIAGNOSIS — Z808 Family history of malignant neoplasm of other organs or systems: Secondary | ICD-10-CM | POA: Insufficient documentation

## 2019-05-06 DIAGNOSIS — Z880 Allergy status to penicillin: Secondary | ICD-10-CM | POA: Insufficient documentation

## 2019-05-06 DIAGNOSIS — Z923 Personal history of irradiation: Secondary | ICD-10-CM | POA: Insufficient documentation

## 2019-05-06 DIAGNOSIS — C7800 Secondary malignant neoplasm of unspecified lung: Secondary | ICD-10-CM | POA: Diagnosis not present

## 2019-05-06 DIAGNOSIS — Z8619 Personal history of other infectious and parasitic diseases: Secondary | ICD-10-CM | POA: Diagnosis not present

## 2019-05-06 DIAGNOSIS — D6481 Anemia due to antineoplastic chemotherapy: Secondary | ICD-10-CM | POA: Insufficient documentation

## 2019-05-06 DIAGNOSIS — Z8042 Family history of malignant neoplasm of prostate: Secondary | ICD-10-CM | POA: Diagnosis not present

## 2019-05-06 MED ORDER — PALONOSETRON HCL INJECTION 0.25 MG/5ML: 0.2500 mg | Freq: Once | INTRAVENOUS | Status: DC

## 2019-05-06 MED ORDER — SODIUM CHLORIDE 0.9 % IV SOLN
Freq: Once | INTRAVENOUS | Status: AC
  Administered 2019-05-06: 11:00:00 via INTRAVENOUS
  Filled 2019-05-06: qty 250

## 2019-05-06 MED ORDER — SODIUM CHLORIDE 0.9% FLUSH
10.0000 mL | INTRAVENOUS | Status: DC | PRN
  Administered 2019-05-06: 10 mL
  Filled 2019-05-06: qty 10

## 2019-05-06 MED ORDER — HEPARIN SOD (PORK) LOCK FLUSH 100 UNIT/ML IV SOLN
500.0000 [IU] | Freq: Once | INTRAVENOUS | Status: AC | PRN
  Administered 2019-05-06: 500 [IU]
  Filled 2019-05-06: qty 5

## 2019-05-06 MED ORDER — SODIUM CHLORIDE 0.9 % IV SOLN
1600.0000 mg | Freq: Once | INTRAVENOUS | Status: AC
  Administered 2019-05-06: 1600 mg via INTRAVENOUS
  Filled 2019-05-06: qty 42.08

## 2019-05-06 MED ORDER — SULFAMETHOXAZOLE-TRIMETHOPRIM 800-160 MG PO TABS: 1.0000 | ORAL_TABLET | ORAL | 0 refills | Status: DC

## 2019-05-06 MED ORDER — PROCHLORPERAZINE MALEATE 10 MG PO TABS
10.0000 mg | ORAL_TABLET | Freq: Once | ORAL | Status: AC
  Administered 2019-05-06: 10 mg via ORAL
  Filled 2019-05-06: qty 1

## 2019-05-08 NOTE — Progress Notes (Signed)
Hematology/Oncology Consult note [facility]  [phone] [phone]  Patient Care Team: Patient, No Pcp Per as PCP - General (General Practice) Byrnett, Forest Gleason, MD (General Surgery) Gae Dry, MD as Referring Physician (Obstetrics and Gynecology)   Name of the patient: Ashley Molina  [id number]  February 08, 1961   Date of visit: 05/08/19  Diagnosis- Stage IVinvasive mammary carcinoma of the right [id number] negative, PR 5% positive and HER-2/neu negativewith metastases to hilar lymph nodes  Chief complaint/ Reason for visit-on treatment assessment prior to cycle 5-day 1 of gemcitabine  Heme/Onc history: Patient is a 58 year old postmenopausal female who was diagnosed with 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 Her only site of metastatic disease was biopsy-proven hilar adenopathy. She was seen for second opinion at [facility] and underwent neoadjuvant chemotherapy with carbotaxol followed by dose dense AC. Posttreatment scans showed excellent response to treatment with resolution of hilar adenopathy as well as axillary adenopathy but persistent primary tumor at the right breast. She then went on to get right-sided lumpectomy along with axillary lymph node dissection. Final pathology showed 16 mm residual tumor. 0/12 Ln positive for malignancy. Grade 2. Negative margins. No LVI. ER, PR and her 2 neu negative. She also completed radiation therapy to her chest wall and hilar region at in August 2019. She then completed adjuvant xeloda.Patient also had radiation pneumonitis and was treated with a prolonged course of steroids by radiation oncology at [facility].  Patient has had baseline foundation 1 testing done which did not show any evidence of actionable mutations. PDL 1 was 0%. She also had strata testing done at [facility] which did not show any actionable mutations.  Repeat PET CT scan on 08/29/2018 showed new  enlarged hypermetabolic right paratracheal and subcarinal and AP window as well as right hilar lymph nodes compatible with nodal metastatic disease. New and enlarging hypermetabolic irregular solid pulmonary nodules consistent with pulmonary metastases.Eribulin was started in April 2020 but patient had quick disease progression and has been switched to carboplatin and gemcitabine.  Patient had significant neutropenia and thrombocytopenia despite giving Neulasta and therefore carboplatin had to be dropped  Interval history-patient was started on 20 mg prednisone for possible pneumonitis by Dr. Patsey Berthold.  Has been slowly tapered down to 10 mg now.  Since starting steroids patient has felt remarkably better and she does not require oxygen for her ADLs.  Overall energy levels are improved.  Patient denies any specific complaints at this time  ECOG PS- 1 Pain scale- 0   Review of systems- Review of Systems  Constitutional: Positive for malaise/fatigue. Negative for chills, fever and weight loss.  HENT: Negative for congestion, ear discharge and nosebleeds.   Eyes: Negative for blurred vision.  Respiratory: Negative for cough, hemoptysis, sputum production, shortness of breath and wheezing.   Cardiovascular: Negative for chest pain, palpitations, orthopnea and claudication.  Gastrointestinal: Negative for abdominal pain, blood in stool, constipation, diarrhea, heartburn, melena, nausea and vomiting.  Genitourinary: Negative for dysuria, flank pain, frequency, hematuria and urgency.  Musculoskeletal: Negative for back pain, joint pain and myalgias.  Skin: Negative for rash.  Neurological: Negative for dizziness, tingling, focal weakness, seizures, weakness and headaches.  Endo/Heme/Allergies: Does not bruise/bleed easily.  Psychiatric/Behavioral: Negative for depression and suicidal ideas. The patient does not have insomnia.       Allergies  Allergen Reactions  . Penicillins Rash     Past  Medical History:  Diagnosis Date  . Anemia   .  Breast cancer ([place])   . Breast cancer, right ([place]) 04/2017   Hx Lumpectomy, Chemo + Rad tx's.  . Breast cyst, right    aspirated by Dr. Bary Castilla  . Hyperlipidemia   . Personal history of chemotherapy   . Pre-diabetes      Past Surgical History:  Procedure Laterality Date  . AXILLARY LYMPH NODE BIOPSY Right 04/09/2017   Procedure: AXILLARY LYMPH NODE BIOPSY;  Surgeon: Robert Bellow, MD;  Location: [facility] ORS;  Service: General;  Laterality: Right;  . BREAST BIOPSY Right 03/27/2017   US guided breast mass - invasive mammary carcinoma  . BREAST BIOPSY Right 03/27/2017   Lymph node - metastatic carcinoma  . BREAST BIOPSY Right 04/09/2017   Lymph node   . BREAST CYST ASPIRATION Right 11/2009   Dr. Bary Castilla did FNA  . BREAST EXCISIONAL BIOPSY Right 09/28/2017   lumpectomy and 12 lympnode rad neo adj chemo  . COLONOSCOPY  2011  . DILATION AND CURETTAGE OF UTERUS     X3  . ENDOBRONCHIAL ULTRASOUND N/A 04/09/2017   Procedure: ENDOBRONCHIAL ULTRASOUND;  Surgeon: Laverle Hobby, MD;  Location: [facility] ORS;  Service: Pulmonary;  Laterality: N/A;  . ENDOMETRIAL ABLATION    . ENDOMETRIAL BIOPSY  09/2009  . PORTACATH PLACEMENT Left 04/09/2017   Procedure: INSERTION PORT-A-CATH;  Surgeon: Robert Bellow, MD;  Location: [facility] ORS;  Service: General;  Laterality: Left;    Social History   Socioeconomic History  . Marital status: Married    Spouse name: Not on file  . Number of children: Not on file  . Years of education: Not on file  . Highest education level: Not on file  Occupational History  . Not on file  Social Needs  . Financial resource strain: Not on file  . Food insecurity    Worry: Not on file    Inability: Not on file  . Transportation needs    Medical: Not on file    Non-medical: Not on file  Tobacco Use  . Smoking status: Never Smoker  . Smokeless tobacco: Never Used  Substance and Sexual Activity  . Alcohol  use: Not Currently  . Drug use: No  . Sexual activity: Not Currently  Lifestyle  . Physical activity    Days per week: Not on file    Minutes per session: Not on file  . Stress: Not on file  Relationships  . Social Herbalist on phone: Not on file    Gets together: Not on file    Attends religious service: Not on file    Active member of club or organization: Not on file    Attends meetings of clubs or organizations: Not on file    Relationship status: Not on file  . Intimate partner violence    Fear of current or ex partner: Not on file    Emotionally abused: Not on file    Physically abused: Not on file    Forced sexual activity: Not on file  Other Topics Concern  . Not on file  Social History Narrative  . Not on file    Family History  Problem Relation Age of Onset  . Melanoma Maternal Grandmother 71       currently 50  . Brain cancer Maternal Grandfather 51       unk. type; deceased in 62s  . Melanoma Other 4       mat grandmother's father  . Melanoma Father 71  on head; currently 12  . Prostate cancer Maternal Uncle        3 maternal uncles; dx in 69s  . Breast cancer Other        mat grandfather's sister; dx 58s     Current Outpatient Medications:  .  benzonatate (TESSALON PERLES) 100 MG capsule, Take 2 capsules (200 mg total) by mouth 3 (three) times daily., Disp: 90 capsule, Rfl: 2 .  Biotin 2500 MCG CAPS, Take 5,000 mcg/day by mouth daily., Disp: , Rfl:  .  Cholecalciferol (VITAMIN D3) 10000 units TABS, Take by mouth 1 day or 1 dose., Disp: , Rfl:  .  Ferrous Sulfate (IRON) 325 (65 Fe) MG TABS, Take 1 tablet by mouth daily., Disp: , Rfl:  .  Fluticasone Furoate (ARNUITY ELLIPTA) 100 MCG/ACT AEPB, Inhale 1 puff into the lungs daily., Disp: 30 each, Rfl: 3 .  HYDROcodone-homatropine (HYCODAN) 5-1.5 MG/5ML syrup, Take 5 mLs by mouth every 6 (six) hours as needed for cough., Disp: 240 mL, Rfl: 0 .  Multiple Vitamins-Minerals (CENTRUM SILVER  ADULT 50+) TABS, Take 1 tablet by mouth daily., Disp: , Rfl:  .  predniSONE (DELTASONE) 20 MG tablet, Take 1 tablet (20 mg total) by mouth daily with breakfast., Disp: 30 tablet, Rfl: 0 .  sulfamethoxazole-trimethoprim (BACTRIM DS) 800-160 MG tablet, Take 1 tablet by mouth 3 (three) times a week., Disp: 12 tablet, Rfl: 0 No current facility-administered medications for this visit.   Facility-Administered Medications Ordered in Other Visits:  .  Tbo-Filgrastim (GRANIX) injection 480 mcg, 480 mcg, Subcutaneous, Daily, Sindy Guadeloupe, MD, 480 mcg at 05/19/17 1412  Physical exam:  Vitals:   05/06/19 0927  BP: 118/65  Pulse: 78  Temp: 98.1 F (36.7 C)  TempSrc: Tympanic  Weight: 200 lb (90.7 kg)  Height: 5' 3"  (1.6 m)   Physical Exam Constitutional:      General: She is not in acute distress. HENT:     Head: Normocephalic and atraumatic.  Eyes:     Pupils: Pupils are equal, round, and reactive to light.  Neck:     Musculoskeletal: Normal range of motion.  Cardiovascular:     Rate and Rhythm: Normal rate and regular rhythm.     Heart sounds: Normal heart sounds.  Pulmonary:     Effort: Pulmonary effort is normal.     Breath sounds: Normal breath sounds.  Abdominal:     General: Bowel sounds are normal.     Palpations: Abdomen is soft.  Skin:    General: Skin is warm and dry.  Neurological:     Mental Status: She is alert and oriented to person, place, and time.      CMP Latest Ref Rng & Units 01/20/2019  Glucose 70 - 99 mg/dL 110(H)  BUN 6 - 20 mg/dL 19  Creatinine 0.44 - 1.00 mg/dL 0.84  Sodium 135 - 145 mmol/L 138  Potassium 3.5 - 5.1 mmol/L 3.8  Chloride 98 - 111 mmol/L 104  CO2 22 - 32 mmol/L 25  Calcium 8.9 - 10.3 mg/dL 9.7  Total Protein 6.5 - 8.1 g/dL 6.3(L)  Total Bilirubin 0.3 - 1.2 mg/dL 0.7  Alkaline Phos 38 - 126 U/L 93  AST 15 - 41 U/L 21  ALT 0 - 44 U/L 19   CBC Latest Ref Rng & Units 01/20/2019  WBC 4.0 - 10.5 K/uL 10.0  Hemoglobin 12.0 - 15.0 g/dL  12.4  Hematocrit 36.0 - 46.0 % 38.7  Platelets 150 - 400 K/uL 207  Assessment and plan- Patient is a 58 y.o. female  with stage IV triple negative breast cancer with metastases to the lungs and mediastinal adenopathy.    She is here for on treatment assessment prior to cycle 5-day 1 of gemcitabine  1.  Labs done at [place] had a white count of 3.9 with an ANC of 2.2, H&H of 10.3/32.4 and a platelet count of 337.  Counts are therefore okay to proceed with cycle 5-day 1 of gemcitabine today.  She will directly proceed for cycle 5-day 8 of gemcitabine next week.  She will proceed with cycle 6-day 1 of gemcitabine on 05/29/2019 and I will see her for cycle 6-day 15 of gemcitabine on 06/05/2019  2.  Chemo-induced anemia: Stable and her hemoglobin has remained around 10.  3.  Possible pneumonitis.  Patient did have exertional shortness of breath which has improved significantly after starting steroids.  She is on a slow steroid taper and currently and 10 mg and may remain on 5 mg indefinitely as she does get a flareup as soon as she comes off steroids.  She will continue taking Bactrim prophylaxis as well   Visit Diagnosis 1. Malignant neoplasm of upper-outer quadrant of right breast in female, estrogen receptor negative ([place])   2. Encounter for antineoplastic chemotherapy      Dr. Randa Evens, MD, MPH [facility] [phone] 05/08/2019 8:35 AM

## 2019-05-09 ENCOUNTER — Other Ambulatory Visit: Payer: Self-pay | Admitting: Oncology

## 2019-05-09 MED ORDER — SULFAMETHOXAZOLE-TRIMETHOPRIM 800-160 MG PO TABS: 1.0000 | ORAL_TABLET | ORAL | 0 refills | Status: DC

## 2019-05-12 ENCOUNTER — Encounter: Payer: Self-pay | Admitting: Oncology

## 2019-05-12 ENCOUNTER — Other Ambulatory Visit: Payer: Self-pay

## 2019-05-12 ENCOUNTER — Other Ambulatory Visit: Payer: Self-pay | Admitting: *Deleted

## 2019-05-12 DIAGNOSIS — Z20822 Contact with and (suspected) exposure to covid-19: Secondary | ICD-10-CM

## 2019-05-12 DIAGNOSIS — Z20828 Contact with and (suspected) exposure to other viral communicable diseases: Secondary | ICD-10-CM

## 2019-05-13 ENCOUNTER — Ambulatory Visit: Payer: Managed Care, Other (non HMO)

## 2019-05-14 ENCOUNTER — Telehealth: Payer: Self-pay | Admitting: *Deleted

## 2019-05-14 LAB — NOVEL CORONAVIRUS, NAA: SARS-CoV-2, NAA: DETECTED — AB

## 2019-05-14 NOTE — Telephone Encounter (Signed)
Pt had sent message that she needs to cancel tom. Visit because she has tested positive for covid. Dr. Janese Banks sent a message today to ask if pt would like to see if she qualifies for a new med-bamlanivimab.  This is a drug that pt can have to decrease mortality and hopefully to keep her from being hospitalized. There is certain criteria that pt's would have to be approved to take, it is 1 time IV med. At [facility] in [place]. I called the pt and spoke to her and she is in agreement to get med if she qualifies. I have called Richardson Landry at [phone] and left message. I waited about 2 hours and have not heard anything so Josh- palliative care told me to call main number main infusion clinic number is [phone]  .  I did call and steve is there today and he will be checking voicemails and rtn calls as soon as possible. I did call pt back and let her know the status

## 2019-05-15 ENCOUNTER — Other Ambulatory Visit: Payer: Self-pay | Admitting: Nurse Practitioner

## 2019-05-15 ENCOUNTER — Inpatient Hospital Stay: Payer: Managed Care, Other (non HMO)

## 2019-05-15 ENCOUNTER — Telehealth: Payer: Self-pay | Admitting: Nurse Practitioner

## 2019-05-15 ENCOUNTER — Ambulatory Visit (HOSPITAL_COMMUNITY)
Admission: RE | Admit: 2019-05-15 | Discharge: 2019-05-15 | Disposition: A | Discharge status: Home / Self Care | Payer: Managed Care, Other (non HMO) | Source: Ambulatory Visit | Attending: Pulmonary Disease | Admitting: Pulmonary Disease

## 2019-05-15 ENCOUNTER — Telehealth: Payer: Self-pay | Admitting: *Deleted

## 2019-05-15 ENCOUNTER — Other Ambulatory Visit: Payer: Self-pay | Admitting: *Deleted

## 2019-05-15 DIAGNOSIS — D701 Agranulocytosis secondary to cancer chemotherapy: Secondary | ICD-10-CM | POA: Insufficient documentation

## 2019-05-15 DIAGNOSIS — U071 COVID-19: Secondary | ICD-10-CM | POA: Diagnosis present

## 2019-05-15 DIAGNOSIS — T451X5A Adverse effect of antineoplastic and immunosuppressive drugs, initial encounter: Secondary | ICD-10-CM | POA: Insufficient documentation

## 2019-05-15 MED ORDER — DEXAMETHASONE 4 MG PO TABS: 4.0000 mg | ORAL_TABLET | Freq: Two times a day (BID) | ORAL | 0 refills | Status: DC

## 2019-05-15 MED ORDER — SODIUM CHLORIDE 0.9 % IV SOLN: INTRAVENOUS | Status: DC | PRN

## 2019-05-15 MED ORDER — SODIUM CHLORIDE 0.9 % IV SOLN
700.0000 mg | Freq: Once | INTRAVENOUS | Status: AC
  Administered 2019-05-15: 700 mg via INTRAVENOUS
  Filled 2019-05-15: qty 20

## 2019-05-15 MED ORDER — FAMOTIDINE IN NACL 20-0.9 MG/50ML-% IV SOLN: 20.0000 mg | Freq: Once | INTRAVENOUS | Status: DC | PRN

## 2019-05-15 MED ORDER — METHYLPREDNISOLONE SODIUM SUCC 125 MG IJ SOLR: 125.0000 mg | Freq: Once | INTRAMUSCULAR | Status: DC | PRN

## 2019-05-15 MED ORDER — DIPHENHYDRAMINE HCL 50 MG/ML IJ SOLN: 50.0000 mg | Freq: Once | INTRAMUSCULAR | Status: DC | PRN

## 2019-05-15 MED ORDER — EPINEPHRINE 0.3 MG/0.3ML IJ SOAJ: 0.3000 mg | Freq: Once | INTRAMUSCULAR | Status: DC | PRN

## 2019-05-15 MED ORDER — ALBUTEROL SULFATE HFA 108 (90 BASE) MCG/ACT IN AERS: 2.0000 | INHALATION_SPRAY | Freq: Once | RESPIRATORY_TRACT | Status: DC | PRN

## 2019-05-15 NOTE — Progress Notes (Signed)
  I connected by phone with Ashley Molina on 05/15/2019 at 11:41 AM to discuss the potential use of an new treatment for mild to moderate COVID-19 viral infection in non-hospitalized patients.  This patient is a 58 y.o. female that meets the FDA criteria for Emergency Use Authorization of bamlanivimab or casirivimab\imdevimab.  Has a (+) direct SARS-CoV-2 viral test result  Has mild or moderate COVID-19   Is ? 60 years of age and weighs ? 40 kg  Is NOT hospitalized due to COVID-19  Is NOT requiring oxygen therapy or requiring an increase in baseline oxygen flow rate due to COVID-19  Is within 10 days of symptom onset  Has at least one of the high risk factor(s) for progression to severe COVID-19 and/or hospitalization as defined in EUA.  Specific high risk criteria : Currently receiving immunosuppressive treatment Patient Active Problem List   Diagnosis Date Noted  . Pneumonitis 01/20/2019  . Goals of care, counseling/discussion 10/08/2018  . Chemotherapy induced neutropenia ([place]) 05/03/2017  . 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 Malignant neoplasm of upper-outer quadrant of right breast in female, estrogen receptor negative ([place]) 04/05/2017  . Anemia 03/14/2017  . Solitary cyst of breast, right 03/14/2017  . Pre-diabetes 03/14/2017  . Hypercholesterolemia 03/14/2017  . Mass of upper outer quadrant of right breast 03/14/2017     I have spoken and communicated the following to the patient or parent/caregiver:  1. FDA has authorized the emergency use of bamlanivimab and casirivimab\imdevimab for the treatment of mild to moderate COVID-19 in adults and pediatric patients with positive results of direct SARS-CoV-2 viral testing who are 28 years of age and older weighing at least 40 kg, and who are at high risk for progressing to severe COVID-19 and/or hospitalization.  2. The significant known and potential risks and benefits of bamlanivimab and  casirivimab\imdevimab, and the extent to which such potential risks and benefits are unknown.  3. Information on available alternative treatments and the risks and benefits of those alternatives, including clinical trials.  4. Patients treated with bamlanivimab and casirivimab\imdevimab should continue to self-isolate and use infection control measures (e.g., wear mask, isolate, social distance, avoid sharing personal items, clean and disinfect "high touch" surfaces, and frequent handwashing) according to CDC guidelines.   5. The patient or parent/caregiver has the option to accept or refuse bamlanivimab or casirivimab\imdevimab .  After reviewing this information with the patient, The patient agreed to proceed with receiving the bamlanimivab infusion and will be provided a copy of the Fact sheet prior to receiving the infusion.Fenton Foy 05/15/2019 11:41 AM

## 2019-05-15 NOTE — Telephone Encounter (Signed)
Called pt and let her know that [facility] will be [facility] her to set up appt. Dr Janese Banks wants her to start the decadron 4 mg bid with food for 10 days and then go back on prednisone. She is agreeable and she gave me the pharmacy in [place] to send it into because she is staying with her daughter because of covid positive test.

## 2019-05-15 NOTE — Telephone Encounter (Signed)
Called to Discuss with patient about Covid symptoms and the use of bamlanivimab, a monoclonal antibody infusion for those with mild to moderate Covid symptoms and at a high risk of hospitalization.     Pt is qualified for this infusion at the [facility] infusion center due to co-morbid conditions and/or a member of an at-risk group.    Patient is managed for the following:  Patient Active Problem List   Diagnosis Date Noted  . Pneumonitis 01/20/2019  . Goals of care, counseling/discussion 10/08/2018  . Chemotherapy induced neutropenia ([place]) 05/03/2017  . 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 Malignant neoplasm of upper-outer quadrant of right breast in female, estrogen receptor negative ([place]) 04/05/2017  . Anemia 03/14/2017  . Solitary cyst of breast, right 03/14/2017  . Pre-diabetes 03/14/2017  . Hypercholesterolemia 03/14/2017  . Mass of upper outer quadrant of right breast 03/14/2017      Unable to reach pt. Left message for call back.

## 2019-05-15 NOTE — Progress Notes (Signed)
  Diagnosis: COVID-19  Physician: Mannam  Procedure: Covid Infusion Clinic Med: bamlanivimab infusion - Provided patient with bamlanimivab fact sheet for patients, parents and caregivers prior to infusion.  Complications: No immediate complications noted.  Discharge: Discharged home   [place], Remo Lipps 05/15/2019

## 2019-05-22 ENCOUNTER — Encounter: Payer: Self-pay | Admitting: *Deleted

## 2019-05-23 ENCOUNTER — Inpatient Hospital Stay (HOSPITAL_BASED_OUTPATIENT_CLINIC_OR_DEPARTMENT_OTHER): Payer: Managed Care, Other (non HMO) | Admitting: Oncology

## 2019-05-23 ENCOUNTER — Other Ambulatory Visit: Payer: Self-pay

## 2019-05-23 ENCOUNTER — Encounter: Payer: Self-pay | Admitting: Oncology

## 2019-05-23 DIAGNOSIS — U071 COVID-19: Secondary | ICD-10-CM

## 2019-05-23 DIAGNOSIS — Z5111 Encounter for antineoplastic chemotherapy: Secondary | ICD-10-CM | POA: Diagnosis not present

## 2019-05-23 DIAGNOSIS — D849 Immunodeficiency, unspecified: Secondary | ICD-10-CM | POA: Diagnosis not present

## 2019-05-23 NOTE — Progress Notes (Signed)
Patient stated that she tested positive for COVID-19 and she is currently on antibiotics.

## 2019-05-27 ENCOUNTER — Ambulatory Visit: Payer: Managed Care, Other (non HMO)

## 2019-05-28 NOTE — Progress Notes (Signed)
I connected with Ashley Molina on 05/28/19 at 11:00 AM EST by video enabled telemedicine visit and verified that I am speaking with the correct person using two identifiers.   I discussed the limitations, risks, security and privacy concerns of performing an evaluation and management service by telemedicine and the availability of in-person appointments. I also discussed with the patient that there may be a patient responsible charge related to this service. The patient expressed understanding and agreed to proceed.  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 Her only site of metastatic disease was biopsy-proven hilar adenopathy. She was seen for second opinion at [facility] and underwent neoadjuvant chemotherapy with carbotaxol followed by dose dense AC. Posttreatment scans showed excellent response to treatment with resolution of hilar adenopathy as well as axillary adenopathy but persistent primary tumor at the right breast. She then went on to get right-sided lumpectomy along with axillary lymph node dissection. Final pathology showed 16 mm residual tumor. 0/12 Ln positive for malignancy. Grade 2. Negative margins. No LVI. ER, PR and her 2 neu negative. She also completed radiation therapy to her chest wall and hilar region at in August 2019. She then completed adjuvant xeloda.Patient also had radiation pneumonitis and was treated with a prolonged course of steroids by radiation oncology at [facility].  Patient has had  baseline foundation 1 testing done which did not show any evidence of actionable mutations. PDL 1 was 0%. She also had strata testing done at [facility] which did not show any actionable mutations.  Repeat PET CT scan on 08/29/2018 showed new enlarged hypermetabolic right paratracheal and subcarinal and AP window as well as right hilar lymph nodes compatible with nodal metastatic disease. New and enlarging hypermetabolic irregular solid pulmonary nodules consistent with pulmonary metastases.Eribulin was started in April 2020 but patient had quick disease progression and has been switched to carboplatin and gemcitabine.Patient had significant neutropenia and thrombocytopenia despite giving Neulasta and therefore carboplatin had to be dropped  Interval history: Patient diagnosed with COVID infection about 10 days ago.  She ha low grade fever and some runny nose. She was started on decadron and given 1 dose of bamlanavamib. She feels better at this time. Home pulse ox does nt show hypoxia. She sometimes drops her O2 sats on ambulation which has been the case even before covid   Review of Systems  Constitutional: Positive for malaise/fatigue. Negative for chills, fever and weight loss.  HENT: Negative for congestion, ear discharge and nosebleeds.   Eyes: Negative for blurred vision.  Respiratory: Negative for cough, hemoptysis, sputum production, shortness of breath and wheezing.   Cardiovascular: Negative for chest pain, palpitations, orthopnea and claudication.  Gastrointestinal: Negative for abdominal pain, blood in stool, constipation, diarrhea, heartburn, melena, nausea and vomiting.  Genitourinary: Negative for dysuria, flank pain, frequency, hematuria and urgency.  Musculoskeletal: Negative for back pain, joint pain and myalgias.  Skin: Negative for rash.  Neurological: Negative for dizziness, tingling, focal weakness, seizures, weakness and headaches.  Endo/Heme/Allergies: Does not bruise/bleed  easily.  Psychiatric/Behavioral: Negative for depression and suicidal ideas. The patient  does not have insomnia.     Allergies  Allergen Reactions  . Penicillins Rash    Past Medical History:  Diagnosis Date  . Anemia   . Breast cancer ([place])   . Breast cancer, right ([place]) 04/2017   Hx Lumpectomy, Chemo + Rad tx's.  . Breast cyst, right    aspirated by Dr. Bary Castilla  . Hyperlipidemia   . Personal history of chemotherapy   . Pre-diabetes     Past Surgical History:  Procedure Laterality Date  . AXILLARY LYMPH NODE BIOPSY Right 04/09/2017   Procedure: AXILLARY LYMPH NODE BIOPSY;  Surgeon: Robert Bellow, MD;  Location: [facility] ORS;  Service: General;  Laterality: Right;  . BREAST BIOPSY Right 03/27/2017   US guided breast mass - invasive mammary carcinoma  . BREAST BIOPSY Right 03/27/2017   Lymph node - metastatic carcinoma  . BREAST BIOPSY Right 04/09/2017   Lymph node   . BREAST CYST ASPIRATION Right 11/2009   Dr. Bary Castilla did FNA  . BREAST EXCISIONAL BIOPSY Right 09/28/2017   lumpectomy and 12 lympnode rad neo adj chemo  . COLONOSCOPY  2011  . DILATION AND CURETTAGE OF UTERUS     X3  . ENDOBRONCHIAL ULTRASOUND N/A 04/09/2017   Procedure: ENDOBRONCHIAL ULTRASOUND;  Surgeon: Laverle Hobby, MD;  Location: [facility] ORS;  Service: Pulmonary;  Laterality: N/A;  . ENDOMETRIAL ABLATION    . ENDOMETRIAL BIOPSY  09/2009  . PORTACATH PLACEMENT Left 04/09/2017   Procedure: INSERTION PORT-A-CATH;  Surgeon: Robert Bellow, MD;  Location: [facility] ORS;  Service: General;  Laterality: Left;    Social History   Socioeconomic History  . Marital status: Married    Spouse name: Not on file  . Number of children: Not on file  . Years of education: Not on file  . Highest education level: Not on file  Occupational History  . Not on file  Tobacco Use  . Smoking status: Never Smoker  . Smokeless tobacco: Never Used  Substance and Sexual Activity  . Alcohol use: Not Currently  .  Drug use: No  . Sexual activity: Not Currently  Other Topics Concern  . Not on file  Social History Narrative  . Not on file   Social Determinants of Health   Financial Resource Strain:   . Difficulty of Paying Living Expenses: Not on file  Food Insecurity:   . Worried About Charity fundraiser in the Last Year: Not on file  . Ran Out of Food in the Last Year: Not on file  Transportation Needs:   . Lack of Transportation (Medical): Not on file  . Lack of Transportation (Non-Medical): Not on file  Physical Activity:   . Days of Exercise per Week: Not on file  . Minutes of Exercise per Session: Not on file  Stress:   . Feeling of Stress : Not on file  Social Connections:   . Frequency of Communication with Friends and Family: Not on file  . Frequency of Social Gatherings with Friends and Family: Not on file  . Attends Religious Services: Not on file  . Active Member of Clubs or Organizations: Not on file  . Attends Archivist Meetings: Not on file  . Marital Status: Not on file  Intimate Partner Violence:   . Fear of Current or Ex-Partner: Not on file  . Emotionally Abused: Not on file  . Physically Abused: Not on file  . Sexually Abused: Not on file    Family History  Problem  Relation Age of Onset  . Melanoma Maternal Grandmother 39       currently 63  . Brain cancer Maternal Grandfather 55       unk. type; deceased in 62s  . Melanoma Other 15       mat grandmother's father  . Melanoma Father 33       on head; currently 51  . Prostate cancer Maternal Uncle        3 maternal uncles; dx in 62s  . Breast cancer Other        mat grandfather's sister; dx 63s     Current Outpatient Medications:  .  Biotin 2500 MCG CAPS, Take 5,000 mcg/day by mouth daily., Disp: , Rfl:  .  Cholecalciferol (VITAMIN D3) 10000 units TABS, Take by mouth 1 day or 1 dose., Disp: , Rfl:  .  dexamethasone (DECADRON) 4 MG tablet, Take 1 tablet (4 mg total) by mouth 2 (two) times daily  with a meal., Disp: 20 tablet, Rfl: 0 .  Ferrous Sulfate (IRON) 325 (65 Fe) MG TABS, Take 1 tablet by mouth daily., Disp: , Rfl:  .  Fluticasone Furoate (ARNUITY ELLIPTA) 100 MCG/ACT AEPB, Inhale 1 puff into the lungs daily., Disp: 30 each, Rfl: 3 .  Multiple Vitamins-Minerals (CENTRUM SILVER ADULT 50+) TABS, Take 1 tablet by mouth daily., Disp: , Rfl:  .  Omega-3 Fatty Acids (FISH OIL) 1000 MG CAPS, Take 1 capsule by mouth 1 day or 1 dose., Disp: , Rfl:  .  sulfamethoxazole-trimethoprim (BACTRIM DS) 800-160 MG tablet, Take 1 tablet by mouth 3 (three) times a week., Disp: 12 tablet, Rfl: 0 .  benzonatate (TESSALON PERLES) 100 MG capsule, Take 2 capsules (200 mg total) by mouth 3 (three) times daily. (Patient not taking: Reported on 05/23/2019), Disp: 90 capsule, Rfl: 2 .  HYDROcodone-homatropine (HYCODAN) 5-1.5 MG/5ML syrup, Take 5 mLs by mouth every 6 (six) hours as needed for cough. (Patient not taking: Reported on 05/23/2019), Disp: 240 mL, Rfl: 0 .  predniSONE (DELTASONE) 20 MG tablet, Take 1 tablet (20 mg total) by mouth daily with breakfast. (Patient not taking: Reported on 05/23/2019), Disp: 30 tablet, Rfl: 0 No current facility-administered medications for this visit.  Facility-Administered Medications Ordered in Other Visits:  .  Tbo-Filgrastim (GRANIX) injection 480 mcg, 480 mcg, Subcutaneous, Daily, Sindy Guadeloupe, MD, 480 mcg at 05/19/17 1412  No results found.  No images are attached to the encounter.   CMP Latest Ref Rng & Units 01/20/2019  Glucose 70 - 99 mg/dL 110(H)  BUN 6 - 20 mg/dL 19  Creatinine 0.44 - 1.00 mg/dL 0.84  Sodium 135 - 145 mmol/L 138  Potassium 3.5 - 5.1 mmol/L 3.8  Chloride 98 - 111 mmol/L 104  CO2 22 - 32 mmol/L 25  Calcium 8.9 - 10.3 mg/dL 9.7  Total Protein 6.5 - 8.1 g/dL 6.3(L)  Total Bilirubin 0.3 - 1.2 mg/dL 0.7  Alkaline Phos 38 - 126 U/L 93  AST 15 - 41 U/L 21  ALT 0 - 44 U/L 19   CBC Latest Ref Rng & Units 01/20/2019  WBC 4.0 - 10.5 K/uL  10.0  Hemoglobin 12.0 - 15.0 g/dL 12.4  Hematocrit 36.0 - 46.0 % 38.7  Platelets 150 - 400 K/uL 207     Observation/objective: appears in no acute distress over video visit.   Assessment and plan:Patient is a 58 y.o. female with stage IV triple negative breast cancer with metastases to the lungs and mediastinal adenopathy. this is a  routine visit for f/u of recent covid infection  Patient will complete her decadron course in 3 days. She will go back to low dose prednisone 10 mg which she was on for radiation pneumonitis. She is also s/p 1 dose of bamlanavamib. She has recuperated well so far and will remain in isolation for 10 more days. She is living with her daughter and family. She knows to call us if she has any concerns.  Chemo on hold for 3 weeks from the time she was diagnosed with covid. Last chemo given on 12/1   Follow-up instructions: I will see her on 12/31 with labs cbc with dff/cmp for day 1 cycle 6 of gemcitabine  I discussed the assessment and treatment plan with the patient. The patient was provided an opportunity to ask questions and all were answered. The patient agreed with the plan and demonstrated an understanding of the instructions.   The patient was advised to call back or seek an in-person evaluation if the symptoms worsen or if the condition fails to improve as anticipated.    Visit Diagnosis: 1. COVID-19 in immunocompromised patient [facility]     Dr. Randa Evens, MD, MPH [facility] at [facility] [phone] 05/28/2019 2:31 PM

## 2019-05-29 ENCOUNTER — Ambulatory Visit: Payer: Managed Care, Other (non HMO)

## 2019-06-02 ENCOUNTER — Ambulatory Visit: Payer: Managed Care, Other (non HMO) | Admitting: Pulmonary Disease

## 2019-06-03 ENCOUNTER — Ambulatory Visit: Payer: Managed Care, Other (non HMO) | Admitting: Oncology

## 2019-06-03 ENCOUNTER — Ambulatory Visit: Payer: Managed Care, Other (non HMO)

## 2019-06-05 ENCOUNTER — Inpatient Hospital Stay: Payer: Managed Care, Other (non HMO)

## 2019-06-05 ENCOUNTER — Inpatient Hospital Stay (HOSPITAL_BASED_OUTPATIENT_CLINIC_OR_DEPARTMENT_OTHER): Payer: Managed Care, Other (non HMO) | Admitting: Oncology

## 2019-06-05 ENCOUNTER — Other Ambulatory Visit: Payer: Self-pay

## 2019-06-05 ENCOUNTER — Encounter: Payer: Self-pay | Admitting: Oncology

## 2019-06-05 VITALS — BP 116/73 | HR 79 | Temp 96.7°F | Ht 63.0 in | Wt 197.0 lb

## 2019-06-05 DIAGNOSIS — C50411 Malignant neoplasm of upper-outer quadrant of right female breast: Secondary | ICD-10-CM

## 2019-06-05 DIAGNOSIS — D6959 Other secondary thrombocytopenia: Secondary | ICD-10-CM

## 2019-06-05 DIAGNOSIS — T451X5A Adverse effect of antineoplastic and immunosuppressive drugs, initial encounter: Secondary | ICD-10-CM | POA: Diagnosis not present

## 2019-06-05 DIAGNOSIS — Z5111 Encounter for antineoplastic chemotherapy: Secondary | ICD-10-CM

## 2019-06-05 DIAGNOSIS — Z171 Estrogen receptor negative status [ER-]: Secondary | ICD-10-CM

## 2019-06-05 DIAGNOSIS — Z8616 Personal history of COVID-19: Secondary | ICD-10-CM

## 2019-06-05 DIAGNOSIS — D6481 Anemia due to antineoplastic chemotherapy: Secondary | ICD-10-CM | POA: Diagnosis not present

## 2019-06-05 DIAGNOSIS — Z95828 Presence of other vascular implants and grafts: Secondary | ICD-10-CM

## 2019-06-05 LAB — CBC WITH DIFFERENTIAL/PLATELET
Abs Immature Granulocytes: 0.03 10*3/uL (ref 0.00–0.07)
Basophils Absolute: 0 10*3/uL (ref 0.0–0.1)
Basophils Relative: 1 %
Eosinophils Absolute: 0.1 10*3/uL (ref 0.0–0.5)
Eosinophils Relative: 3 %
HCT: 36.5 % (ref 36.0–46.0)
Hemoglobin: 10.8 g/dL — ABNORMAL LOW (ref 12.0–15.0)
Immature Granulocytes: 1 %
Lymphocytes Relative: 14 %
Lymphs Abs: 0.6 10*3/uL — ABNORMAL LOW (ref 0.7–4.0)
MCH: 28.1 pg (ref 26.0–34.0)
MCHC: 29.6 g/dL — ABNORMAL LOW (ref 30.0–36.0)
MCV: 94.8 fL (ref 80.0–100.0)
Monocytes Absolute: 0.5 10*3/uL (ref 0.1–1.0)
Monocytes Relative: 12 %
Neutro Abs: 3 10*3/uL (ref 1.7–7.7)
Neutrophils Relative %: 69 %
Platelets: 141 10*3/uL — ABNORMAL LOW (ref 150–400)
RBC: 3.85 MIL/uL — ABNORMAL LOW (ref 3.87–5.11)
RDW: 16.1 % — ABNORMAL HIGH (ref 11.5–15.5)
WBC: 4.2 10*3/uL (ref 4.0–10.5)
nRBC: 0 % (ref 0.0–0.2)

## 2019-06-05 LAB — COMPREHENSIVE METABOLIC PANEL
ALT: 23 U/L (ref 0–44)
AST: 17 U/L (ref 15–41)
Albumin: 3.4 g/dL — ABNORMAL LOW (ref 3.5–5.0)
Alkaline Phosphatase: 80 U/L (ref 38–126)
Anion gap: 9 (ref 5–15)
BUN: 19 mg/dL (ref 6–20)
CO2: 23 mmol/L (ref 22–32)
Calcium: 8.6 mg/dL — ABNORMAL LOW (ref 8.9–10.3)
Chloride: 108 mmol/L (ref 98–111)
Creatinine, Ser: 0.63 mg/dL (ref 0.44–1.00)
GFR calc Af Amer: >60 mL/min (ref >60)
GFR calc non Af Amer: >60 mL/min (ref >60)
Glucose, Bld: 111 mg/dL — ABNORMAL HIGH (ref 70–99)
Potassium: 3.3 mmol/L — ABNORMAL LOW (ref 3.5–5.1)
Sodium: 140 mmol/L (ref 135–145)
Total Bilirubin: 0.3 mg/dL (ref 0.3–1.2)
Total Protein: 5.8 g/dL — ABNORMAL LOW (ref 6.5–8.1)

## 2019-06-05 MED ORDER — HEPARIN SOD (PORK) LOCK FLUSH 100 UNIT/ML IV SOLN
500.0000 [IU] | Freq: Once | INTRAVENOUS | Status: AC | PRN
  Administered 2019-06-05: 500 [IU]
  Filled 2019-06-05: qty 5

## 2019-06-05 MED ORDER — SODIUM CHLORIDE 0.9% FLUSH
10.0000 mL | Freq: Once | INTRAVENOUS | Status: AC
  Administered 2019-06-05: 10 mL via INTRAVENOUS
  Filled 2019-06-05: qty 10

## 2019-06-05 MED ORDER — SODIUM CHLORIDE 0.9 % IV SOLN
1600.0000 mg | Freq: Once | INTRAVENOUS | Status: AC
  Administered 2019-06-05: 12:00:00 1600 mg via INTRAVENOUS
  Filled 2019-06-05: qty 26.3

## 2019-06-05 MED ORDER — SODIUM CHLORIDE 0.9 % IV SOLN
Freq: Once | INTRAVENOUS | Status: AC
  Filled 2019-06-05: qty 250

## 2019-06-05 MED ORDER — PALONOSETRON HCL INJECTION 0.25 MG/5ML
0.2500 mg | Freq: Once | INTRAVENOUS | Status: AC
  Administered 2019-06-05: 0.25 mg via INTRAVENOUS
  Filled 2019-06-05: qty 5

## 2019-06-05 MED ORDER — PROCHLORPERAZINE MALEATE 10 MG PO TABS
10.0000 mg | ORAL_TABLET | Freq: Once | ORAL | Status: AC
  Administered 2019-06-05: 11:00:00 10 mg via ORAL
  Filled 2019-06-05: qty 1

## 2019-06-05 NOTE — Progress Notes (Signed)
Patient stated that she had been doing well with no complaints. 

## 2019-06-09 NOTE — Progress Notes (Signed)
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 Her only site of metastatic disease was biopsy-proven hilar adenopathy. She was seen for second opinion at [facility] and underwent neoadjuvant chemotherapy with carbotaxol followed by dose dense AC. Posttreatment scans showed excellent response to treatment with resolution of hilar adenopathy as well as axillary adenopathy but persistent primary tumor at the right breast. She then went on to get right-sided lumpectomy along with axillary lymph node dissection. Final pathology showed 16 mm residual tumor. 0/12 Ln positive for malignancy. Grade 2. Negative margins. No LVI. ER, PR and her 2 neu negative. She also completed radiation therapy to her chest wall and hilar region at in August 2019. She then completed adjuvant xeloda.Patient also had radiation pneumonitis and was treated with a prolonged course of steroids by radiation oncology at [facility].  Patient has had baseline foundation 1 testing done which did not show any evidence of actionable mutations. PDL 1 was 0%. She also had strata testing done at [facility] which did not show any actionable mutations.  Repeat PET CT scan on 08/29/2018 showed new  enlarged hypermetabolic right paratracheal and subcarinal and AP window as well as right hilar lymph nodes compatible with nodal metastatic disease. New and enlarging hypermetabolic irregular solid pulmonary nodules consistent with pulmonary metastases.Eribulin was started in April 2020 but patient had quick disease progression and has been switched to carboplatin and gemcitabine.Patient had significant neutropenia and thrombocytopenia despite giving Neulasta and therefore carboplatin had to be dropped   Interval history-patient had Covid infection about 3 weeks ago and recovered uneventfully.  She denies any complaints at this time.  She does have mild baseline shortness of breath on exertion due to her prior pneumonitis which is essentially stable.  ECOG PS- 1 Pain scale- 0   Review of systems- Review of Systems  Constitutional: Negative for chills, fever, malaise/fatigue and weight loss.  HENT: Negative for congestion, ear discharge and nosebleeds.   Eyes: Negative for blurred vision.  Respiratory: Positive for shortness of breath. Negative for cough, hemoptysis, sputum production and wheezing.   Cardiovascular: Negative for chest pain, palpitations, orthopnea and claudication.  Gastrointestinal: Negative for abdominal pain, blood in stool, constipation, diarrhea, heartburn, melena, nausea and vomiting.  Genitourinary: Negative for dysuria, flank pain, frequency, hematuria and urgency.  Musculoskeletal: Negative for back pain, joint pain and myalgias.  Skin: Negative for rash.  Neurological: Negative for dizziness, tingling, focal weakness, seizures, weakness and headaches.  Endo/Heme/Allergies: Does not bruise/bleed easily.  Psychiatric/Behavioral: Negative for depression and suicidal ideas. The patient does not have insomnia.        Allergies  Allergen Reactions  . Penicillins Rash     Past Medical History:  Diagnosis Date  . Anemia   . Breast cancer ([place])   . Breast  cancer, right ([place]) 04/2017   Hx Lumpectomy,  Chemo + Rad tx's.  . Breast cyst, right    aspirated by Dr. Bary Castilla  . Hyperlipidemia   . Personal history of chemotherapy   . Pre-diabetes      Past Surgical History:  Procedure Laterality Date  . AXILLARY LYMPH NODE BIOPSY Right 04/09/2017   Procedure: AXILLARY LYMPH NODE BIOPSY;  Surgeon: Robert Bellow, MD;  Location: [facility] ORS;  Service: General;  Laterality: Right;  . BREAST BIOPSY Right 03/27/2017   US guided breast mass - invasive mammary carcinoma  . BREAST BIOPSY Right 03/27/2017   Lymph node - metastatic carcinoma  . BREAST BIOPSY Right 04/09/2017   Lymph node   . BREAST CYST ASPIRATION Right 11/2009   Dr. Bary Castilla did FNA  . BREAST EXCISIONAL BIOPSY Right 09/28/2017   lumpectomy and 12 lympnode rad neo adj chemo  . COLONOSCOPY  2011  . DILATION AND CURETTAGE OF UTERUS     X3  . ENDOBRONCHIAL ULTRASOUND N/A 04/09/2017   Procedure: ENDOBRONCHIAL ULTRASOUND;  Surgeon: Laverle Hobby, MD;  Location: [facility] ORS;  Service: Pulmonary;  Laterality: N/A;  . ENDOMETRIAL ABLATION    . ENDOMETRIAL BIOPSY  09/2009  . PORTACATH PLACEMENT Left 04/09/2017   Procedure: INSERTION PORT-A-CATH;  Surgeon: Robert Bellow, MD;  Location: [facility] ORS;  Service: General;  Laterality: Left;    Social History   Socioeconomic History  . Marital status: Married    Spouse name: Not on file  . Number of children: Not on file  . Years of education: Not on file  . Highest education level: Not on file  Occupational History  . Not on file  Tobacco Use  . Smoking status: Never Smoker  . Smokeless tobacco: Never Used  Substance and Sexual Activity  . Alcohol use: Not Currently  . Drug use: No  . Sexual activity: Not Currently  Other Topics Concern  . Not on file  Social History Narrative  . Not on file   Social Determinants of Health   Financial Resource Strain:   . Difficulty of Paying Living Expenses: Not on file  Food  Insecurity:   . Worried About Charity fundraiser in the Last Year: Not on file  . Ran Out of Food in the Last Year: Not on file  Transportation Needs:   . Lack of Transportation (Medical): Not on file  . Lack of Transportation (Non-Medical): Not on file  Physical Activity:   . Days of Exercise per Week: Not on file  . Minutes of Exercise per Session: Not on file  Stress:   . Feeling of Stress : Not on file  Social Connections:   . Frequency of Communication with Friends and Family: Not on file  . Frequency of Social Gatherings with Friends and Family: Not on file  . Attends Religious Services: Not on file  . Active Member of Clubs or Organizations: Not on file  . Attends Archivist Meetings: Not on file  . Marital Status: Not on file  Intimate Partner Violence:   . Fear of Current or Ex-Partner: Not on file  . Emotionally Abused: Not on file  . Physically Abused: Not on file  . Sexually Abused: Not on file    Family History  Problem Relation Age of Onset  . Melanoma Maternal Grandmother 30       currently 52  . Brain cancer Maternal Grandfather 63       unk. type; deceased in 46s  . Melanoma Other 90  mat grandmother's father  . Melanoma Father 81       on head; currently 54  . Prostate cancer Maternal Uncle        3 maternal uncles; dx in 71s  . Breast cancer Other        mat grandfather's sister; dx 89s     Current Outpatient Medications:  .  benzonatate (TESSALON PERLES) 100 MG capsule, Take 2 capsules (200 mg total) by mouth 3 (three) times daily., Disp: 90 capsule, Rfl: 2 .  Biotin 2500 MCG CAPS, Take 5,000 mcg/day by mouth daily., Disp: , Rfl:  .  Cholecalciferol (VITAMIN D3) 10000 units TABS, Take by mouth 1 day or 1 dose., Disp: , Rfl:  .  dexamethasone (DECADRON) 4 MG tablet, Take 1 tablet (4 mg total) by mouth 2 (two) times daily with a meal., Disp: 20 tablet, Rfl: 0 .  Ferrous Sulfate (IRON) 325 (65 Fe) MG TABS, Take 1 tablet by mouth daily.,  Disp: , Rfl:  .  Fluticasone Furoate (ARNUITY ELLIPTA) 100 MCG/ACT AEPB, Inhale 1 puff into the lungs daily., Disp: 30 each, Rfl: 3 .  HYDROcodone-homatropine (HYCODAN) 5-1.5 MG/5ML syrup, Take 5 mLs by mouth every 6 (six) hours as needed for cough., Disp: 240 mL, Rfl: 0 .  Multiple Vitamins-Minerals (CENTRUM SILVER ADULT 50+) TABS, Take 1 tablet by mouth daily., Disp: , Rfl:  .  Omega-3 Fatty Acids (FISH OIL) 1000 MG CAPS, Take 1 capsule by mouth 1 day or 1 dose., Disp: , Rfl:  .  predniSONE (DELTASONE) 20 MG tablet, Take 1 tablet (20 mg total) by mouth daily with breakfast. (Patient taking differently: Take 10 mg by mouth daily with breakfast. ), Disp: 30 tablet, Rfl: 0 .  sulfamethoxazole-trimethoprim (BACTRIM DS) 800-160 MG tablet, Take 1 tablet by mouth 3 (three) times a week., Disp: 12 tablet, Rfl: 0 No current facility-administered medications for this visit.  Facility-Administered Medications Ordered in Other Visits:  .  Tbo-Filgrastim (GRANIX) injection 480 mcg, 480 mcg, Subcutaneous, Daily, Sindy Guadeloupe, MD, 480 mcg at 05/19/17 1412  Physical exam:  Vitals:   06/05/19 1003  BP: 116/73  Pulse: 79  Temp: (!) 96.7 F (35.9 C)  TempSrc: Tympanic  Weight: 197 lb (89.4 kg)  Height: 5' 3"  (1.6 m)   Physical Exam Constitutional:      General: She is not in acute distress. HENT:     Head: Normocephalic and atraumatic.  Eyes:     Pupils: Pupils are equal, round, and reactive to light.  Cardiovascular:     Rate and Rhythm: Normal rate and regular rhythm.     Heart sounds: Normal heart sounds.  Pulmonary:     Effort: Pulmonary effort is normal.     Breath sounds: Normal breath sounds.  Abdominal:     General: Bowel sounds are normal.     Palpations: Abdomen is soft.  Musculoskeletal:     Cervical back: Normal range of motion.  Skin:    General: Skin is warm and dry.  Neurological:     Mental Status: She is alert and oriented to person, place, and time.      CMP Latest  Ref Rng & Units 06/05/2019  Glucose 70 - 99 mg/dL 111(H)  BUN 6 - 20 mg/dL 19  Creatinine 0.44 - 1.00 mg/dL 0.63  Sodium 135 - 145 mmol/L 140  Potassium 3.5 - 5.1 mmol/L 3.3(L)  Chloride 98 - 111 mmol/L 108  CO2 22 - 32 mmol/L 23  Calcium 8.9 -  10.3 mg/dL 8.6(L)  Total Protein 6.5 - 8.1 g/dL 5.8(L)  Total Bilirubin 0.3 - 1.2 mg/dL 0.3  Alkaline Phos 38 - 126 U/L 80  AST 15 - 41 U/L 17  ALT 0 - 44 U/L 23   CBC Latest Ref Rng & Units 06/05/2019  WBC 4.0 - 10.5 K/uL 4.2  Hemoglobin 12.0 - 15.0 g/dL 10.8(L)  Hematocrit 36.0 - 46.0 % 36.5  Platelets 150 - 400 K/uL 141(L)      Assessment and plan- Patient is a 60 y.o. 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  Patient has recovered from Covid 3 weeks ago.  She did receive Decadron and regeneron as an outpatient and did not require hospitalization.  It would be okay for her to proceed with chemotherapy at this time.  She remains on 10 mg of prednisone given her prior episode of pneumonitis and follows up with pulmonary as well  2.  Counts are otherwise okay to proceed with cycle 6-day 1 of gemcitabine today.  She will directly proceed for cycle 6-day 8 of gemcitabine next week and will receive 1 for Neulasta support on that day.  I will see her back in 3 weeks time for cycle 7-day 1  3.  Patient has mild baseline thrombocytopenia and anemia secondary to chemotherapy which we will continue to monitor. Labs to be [facility] at [facility]   Visit Diagnosis 1. Malignant neoplasm of upper-outer quadrant of right breast in female, estrogen receptor negative ([place])   2. Encounter for antineoplastic chemotherapy   3. History of COVID-19   4. Antineoplastic chemotherapy induced anemia   5. Chemotherapy-induced thrombocytopenia      Dr. Randa Evens, MD, MPH [facility] at [facility] [phone] 06/09/2019 8:10 AM

## 2019-06-11 ENCOUNTER — Other Ambulatory Visit: Payer: Self-pay

## 2019-06-11 MED ORDER — ARNUITY ELLIPTA 100 MCG/ACT IN AEPB: 1.0000 | INHALATION_SPRAY | Freq: Every day | RESPIRATORY_TRACT | 3 refills | Status: AC

## 2019-06-12 ENCOUNTER — Encounter: Payer: Self-pay | Admitting: Oncology

## 2019-06-12 ENCOUNTER — Inpatient Hospital Stay: Payer: Managed Care, Other (non HMO) | Attending: Oncology

## 2019-06-12 ENCOUNTER — Other Ambulatory Visit: Payer: Self-pay

## 2019-06-12 VITALS — BP 124/77 | HR 93 | Temp 97.9°F | Resp 18 | Wt 197.1 lb

## 2019-06-12 DIAGNOSIS — R748 Abnormal levels of other serum enzymes: Secondary | ICD-10-CM | POA: Diagnosis not present

## 2019-06-12 DIAGNOSIS — Z808 Family history of malignant neoplasm of other organs or systems: Secondary | ICD-10-CM | POA: Diagnosis not present

## 2019-06-12 DIAGNOSIS — Z803 Family history of malignant neoplasm of breast: Secondary | ICD-10-CM | POA: Insufficient documentation

## 2019-06-12 DIAGNOSIS — Z171 Estrogen receptor negative status [ER-]: Secondary | ICD-10-CM | POA: Diagnosis not present

## 2019-06-12 DIAGNOSIS — D696 Thrombocytopenia, unspecified: Secondary | ICD-10-CM | POA: Insufficient documentation

## 2019-06-12 DIAGNOSIS — Z5111 Encounter for antineoplastic chemotherapy: Secondary | ICD-10-CM | POA: Diagnosis not present

## 2019-06-12 DIAGNOSIS — C50411 Malignant neoplasm of upper-outer quadrant of right female breast: Secondary | ICD-10-CM | POA: Insufficient documentation

## 2019-06-12 DIAGNOSIS — C771 Secondary and unspecified malignant neoplasm of intrathoracic lymph nodes: Secondary | ICD-10-CM | POA: Diagnosis not present

## 2019-06-12 DIAGNOSIS — Z5189 Encounter for other specified aftercare: Secondary | ICD-10-CM | POA: Insufficient documentation

## 2019-06-12 DIAGNOSIS — R0602 Shortness of breath: Secondary | ICD-10-CM | POA: Diagnosis not present

## 2019-06-12 DIAGNOSIS — Z923 Personal history of irradiation: Secondary | ICD-10-CM | POA: Diagnosis not present

## 2019-06-12 DIAGNOSIS — D709 Neutropenia, unspecified: Secondary | ICD-10-CM | POA: Diagnosis not present

## 2019-06-12 DIAGNOSIS — Z7952 Long term (current) use of systemic steroids: Secondary | ICD-10-CM | POA: Insufficient documentation

## 2019-06-12 DIAGNOSIS — Z8042 Family history of malignant neoplasm of prostate: Secondary | ICD-10-CM | POA: Diagnosis not present

## 2019-06-12 DIAGNOSIS — R5383 Other fatigue: Secondary | ICD-10-CM | POA: Insufficient documentation

## 2019-06-12 MED ORDER — SODIUM CHLORIDE 0.9 % IV SOLN
Freq: Once | INTRAVENOUS | Status: AC
  Filled 2019-06-12: qty 250

## 2019-06-12 MED ORDER — PROCHLORPERAZINE MALEATE 10 MG PO TABS
10.0000 mg | ORAL_TABLET | Freq: Once | ORAL | Status: AC
  Administered 2019-06-12: 10 mg via ORAL
  Filled 2019-06-12: qty 1

## 2019-06-12 MED ORDER — HEPARIN SOD (PORK) LOCK FLUSH 100 UNIT/ML IV SOLN
500.0000 [IU] | Freq: Once | INTRAVENOUS | Status: AC | PRN
  Administered 2019-06-12: 15:00:00 500 [IU]
  Filled 2019-06-12: qty 5

## 2019-06-12 MED ORDER — SODIUM CHLORIDE 0.9 % IV SOLN
1600.0000 mg | Freq: Once | INTRAVENOUS | Status: AC
  Administered 2019-06-12: 15:00:00 1600 mg via INTRAVENOUS
  Filled 2019-06-12: qty 26.3

## 2019-06-12 MED ORDER — PEGFILGRASTIM 6 MG/0.6ML ~~LOC~~ PSKT
6.0000 mg | PREFILLED_SYRINGE | Freq: Once | SUBCUTANEOUS | Status: AC
  Administered 2019-06-12: 15:00:00 6 mg via SUBCUTANEOUS
  Filled 2019-06-12: qty 0.6

## 2019-06-13 ENCOUNTER — Other Ambulatory Visit: Payer: Self-pay

## 2019-06-13 DIAGNOSIS — C7800 Secondary malignant neoplasm of unspecified lung: Secondary | ICD-10-CM

## 2019-06-13 DIAGNOSIS — C50911 Malignant neoplasm of unspecified site of right female breast: Secondary | ICD-10-CM

## 2019-06-13 MED ORDER — SULFAMETHOXAZOLE-TRIMETHOPRIM 800-160 MG PO TABS: 1.0000 | ORAL_TABLET | ORAL | 0 refills | Status: DC

## 2019-06-13 NOTE — Telephone Encounter (Signed)
Yes continue

## 2019-06-15 ENCOUNTER — Other Ambulatory Visit: Payer: Self-pay | Admitting: *Deleted

## 2019-06-15 DIAGNOSIS — C7800 Secondary malignant neoplasm of unspecified lung: Secondary | ICD-10-CM

## 2019-06-15 DIAGNOSIS — C50911 Malignant neoplasm of unspecified site of right female breast: Secondary | ICD-10-CM

## 2019-06-15 MED ORDER — SULFAMETHOXAZOLE-TRIMETHOPRIM 800-160 MG PO TABS: 1.0000 | ORAL_TABLET | ORAL | 0 refills | Status: DC

## 2019-06-16 ENCOUNTER — Telehealth: Payer: Self-pay | Admitting: Pulmonary Disease

## 2019-06-16 NOTE — Telephone Encounter (Signed)
Spoke to pt to gather additional information on sx. Pt reports of sob with exertion and lingering cough since being sx with covid. Pt denied additional symptoms.   Per Dr. Patsey Berthold verbally- recommend phone visit.

## 2019-06-16 NOTE — Telephone Encounter (Signed)
LVM to change to phone visit

## 2019-06-17 ENCOUNTER — Ambulatory Visit: Payer: Managed Care, Other (non HMO) | Admitting: Pulmonary Disease

## 2019-06-17 ENCOUNTER — Encounter: Payer: Self-pay | Admitting: Pulmonary Disease

## 2019-06-17 DIAGNOSIS — C50911 Malignant neoplasm of unspecified site of right female breast: Secondary | ICD-10-CM

## 2019-06-17 DIAGNOSIS — C7800 Secondary malignant neoplasm of unspecified lung: Secondary | ICD-10-CM

## 2019-06-17 MED ORDER — PREDNISONE 10 MG PO TABS: ORAL_TABLET | ORAL | 3 refills | Status: DC

## 2019-06-17 NOTE — Patient Instructions (Signed)
1.  We will decrease her prednisone to 5 mg daily.  2.  I sent a prescription for 10 mg tablets of prednisone you can cut them in half.  If your symptoms get worse increase back to 10 mg daily and give [place] a call.  3.  I do recommend a repeat CT and Dr. Janese Banks schedule set after your chemo.  4.  We will see you in follow-up in 4 to 6 weeks time call us sooner if you have any worsening of your symptoms.

## 2019-06-17 NOTE — Progress Notes (Signed)
    Assessment & Plan:  1. 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  We will decrease her prednisone  to 5 mg daily.  2.  I sent a prescription for 10 mg tablets of prednisone  you can cut them in half.  If your symptoms get worse increase back to 10 mg daily and give us  a call.  3.  I do recommend a repeat CT and Dr. Melanee schedule set after your chemo.  4.  We will see you in follow-up in 4 to 6 weeks time call us  sooner if you have any worsening of your symptoms. covid 12/7  Croupy cough change Post infectious 5 mg pred  4 -6 weeks Ct non contrasted post chemo   Please note: late entry documentation due to logistical difficulties during COVID-19 pandemic. This note is filed for information purposes only, and is not intended to be used for billing, nor does it represent the full scope/nature of the visit in question. Please see any associated scanned media linked to date of encounter for additional pertinent information.  Subjective:    HPI: Ashley Molina is a 59 y.o. female presenting to the pulmonology clinic on 06/17/2019 with report of: No chief complaint on file.     Outpatient Encounter Medications as of 06/17/2019  Medication Sig   Biotin  2500 MCG CAPS Take 5,000 mcg/day by mouth at bedtime.    Cholecalciferol  (VITAMIN D3) 10000 units TABS Take 1 tablet by mouth at bedtime.    Ferrous Sulfate  (IRON) 325 (65 Fe) MG TABS Take 1 tablet by mouth at bedtime.    Fluticasone  Furoate (ARNUITY ELLIPTA ) 100 MCG/ACT AEPB Inhale 1 puff into the lungs daily.   Multiple Vitamins-Minerals (CENTRUM SILVER  ADULT 50+) TABS Take 1 tablet by mouth at bedtime.    [DISCONTINUED] benzonatate  (TESSALON  PERLES) 100 MG capsule Take 2 capsules (200 mg total) by mouth 3 (three) times daily. (Patient taking differently: Take 200 mg by mouth 3 (three) times daily as needed. )   [DISCONTINUED] dexamethasone  (DECADRON ) 4 MG tablet Take 1 tablet (4 mg  total) by mouth 2 (two) times daily with a meal. (Patient not taking: Reported on 06/25/2019)   [DISCONTINUED] HYDROcodone -homatropine (HYCODAN) 5-1.5 MG/5ML syrup Take 5 mLs by mouth every 6 (six) hours as needed for cough. (Patient not taking: Reported on 08/11/2019)   [DISCONTINUED] Omega-3 Fatty Acids (FISH OIL) 1000 MG CAPS Take 1 capsule by mouth at bedtime.    [DISCONTINUED] pantoprazole  (PROTONIX ) 20 MG tablet Take 1 tablet (20 mg total) by mouth daily.   [DISCONTINUED] predniSONE  (DELTASONE ) 10 MG tablet Take 1/2 tablet daily (5 mg) or as directed by MD.   [DISCONTINUED] predniSONE  (DELTASONE ) 20 MG tablet Take 1 tablet (20 mg total) by mouth daily with breakfast. (Patient taking differently: Take 10 mg by mouth daily with breakfast. )   [DISCONTINUED] prochlorperazine  (COMPAZINE ) 10 MG tablet Take 1 tablet (10 mg total) by mouth every 6 (six) hours as needed (Nausea or vomiting).   [DISCONTINUED] sulfamethoxazole -trimethoprim  (BACTRIM  DS) 800-160 MG tablet Take 1 tablet by mouth 3 (three) times a week. (Patient not taking: Reported on 07/17/2019)   [DISCONTINUED] Tbo-Filgrastim  (GRANIX ) injection 480 mcg    No facility-administered encounter medications on file as of 06/17/2019.      Objective:   There were no vitals filed for this visit.   Physical exam documentation is limited by delayed entry of information.

## 2019-06-20 ENCOUNTER — Other Ambulatory Visit: Payer: Self-pay

## 2019-06-20 ENCOUNTER — Encounter: Payer: Self-pay | Admitting: Oncology

## 2019-06-20 ENCOUNTER — Other Ambulatory Visit: Payer: Self-pay | Admitting: *Deleted

## 2019-06-20 MED ORDER — PANTOPRAZOLE SODIUM 20 MG PO TBEC: 20.0000 mg | DELAYED_RELEASE_TABLET | Freq: Every day | ORAL | 1 refills | Status: AC

## 2019-06-25 ENCOUNTER — Encounter: Payer: Self-pay | Admitting: Oncology

## 2019-06-25 ENCOUNTER — Other Ambulatory Visit: Payer: Self-pay

## 2019-06-25 NOTE — Progress Notes (Signed)
Patient stated that she had been doing well with no complaints. 

## 2019-06-26 ENCOUNTER — Inpatient Hospital Stay (HOSPITAL_BASED_OUTPATIENT_CLINIC_OR_DEPARTMENT_OTHER): Payer: Managed Care, Other (non HMO) | Admitting: Oncology

## 2019-06-26 ENCOUNTER — Inpatient Hospital Stay: Payer: Managed Care, Other (non HMO)

## 2019-06-26 ENCOUNTER — Encounter: Payer: Self-pay | Admitting: Oncology

## 2019-06-26 ENCOUNTER — Other Ambulatory Visit: Payer: Self-pay

## 2019-06-26 VITALS — BP 114/85 | HR 92 | Temp 98.7°F | Ht 63.0 in | Wt 201.0 lb

## 2019-06-26 DIAGNOSIS — Z171 Estrogen receptor negative status [ER-]: Secondary | ICD-10-CM

## 2019-06-26 DIAGNOSIS — C7800 Secondary malignant neoplasm of unspecified lung: Secondary | ICD-10-CM | POA: Diagnosis not present

## 2019-06-26 DIAGNOSIS — C50411 Malignant neoplasm of upper-outer quadrant of right female breast: Secondary | ICD-10-CM

## 2019-06-26 DIAGNOSIS — C50911 Malignant neoplasm of unspecified site of right female breast: Secondary | ICD-10-CM | POA: Diagnosis not present

## 2019-06-26 DIAGNOSIS — Z5111 Encounter for antineoplastic chemotherapy: Secondary | ICD-10-CM

## 2019-06-26 MED ORDER — PROCHLORPERAZINE MALEATE 10 MG PO TABS
10.0000 mg | ORAL_TABLET | Freq: Once | ORAL | Status: AC
  Administered 2019-06-26: 10 mg via ORAL
  Filled 2019-06-26: qty 1

## 2019-06-26 MED ORDER — PALONOSETRON HCL INJECTION 0.25 MG/5ML
0.2500 mg | Freq: Once | INTRAVENOUS | Status: DC
  Filled 2019-06-26: qty 5

## 2019-06-26 MED ORDER — SODIUM CHLORIDE 0.9% FLUSH
10.0000 mL | INTRAVENOUS | Status: DC | PRN
  Administered 2019-06-26: 12:00:00 10 mL
  Filled 2019-06-26: qty 10

## 2019-06-26 MED ORDER — HEPARIN SOD (PORK) LOCK FLUSH 100 UNIT/ML IV SOLN
500.0000 [IU] | Freq: Once | INTRAVENOUS | Status: AC | PRN
  Administered 2019-06-26: 13:00:00 500 [IU]
  Filled 2019-06-26: qty 5

## 2019-06-26 MED ORDER — SODIUM CHLORIDE 0.9 % IV SOLN
1600.0000 mg | Freq: Once | INTRAVENOUS | Status: AC
  Administered 2019-06-26: 12:00:00 1600 mg via INTRAVENOUS
  Filled 2019-06-26: qty 26.3

## 2019-06-26 MED ORDER — SODIUM CHLORIDE 0.9 % IV SOLN
Freq: Once | INTRAVENOUS | Status: AC
  Filled 2019-06-26: qty 250

## 2019-06-26 MED ORDER — HEPARIN SOD (PORK) LOCK FLUSH 100 UNIT/ML IV SOLN
INTRAVENOUS | Status: AC
  Filled 2019-06-26: qty 5

## 2019-06-27 NOTE — Progress Notes (Signed)
Hematology/Oncology Consult note [facility]  [phone] [phone]  Patient Care Team: Patient, No Pcp Per as PCP - General (General Practice) Byrnett, Forest Gleason, MD (General Surgery) Gae Dry, MD as Referring Physician (Obstetrics and Gynecology)   Name of the patient: Ashley Molina  [id number]  [birth year]   Date of visit: 06/27/19  Diagnosis- Stage IVinvasive mammary carcinoma of the right [id number] negative, PR 5% positive and HER-2/neu negativewith metastases to hilar lymph nodes  Chief complaint/ Reason for visit-on treatment assessment prior to cycle 7-day 1 of gemcitabine  Heme/Onc history: Patient is a 59 year old postmenopausal female who was diagnosed with 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 Her only site of metastatic disease was biopsy-proven hilar adenopathy. She was seen for second opinion at [facility] and underwent neoadjuvant chemotherapy with carbotaxol followed by dose dense AC. Posttreatment scans showed excellent response to treatment with resolution of hilar adenopathy as well as axillary adenopathy but persistent primary tumor at the right breast. She then went on to get right-sided lumpectomy along with axillary lymph node dissection. Final pathology showed 16 mm residual tumor. 0/12 Ln positive for malignancy. Grade 2. Negative margins. No LVI. ER, PR and her 2 neu negative. She also completed radiation therapy to her chest wall and hilar region at in August 2019. She then completed adjuvant xeloda.Patient also had radiation pneumonitis and was treated with a prolonged course of steroids by radiation oncology at [facility].  Patient has had baseline foundation 1 testing done which did not show any evidence of actionable mutations. PDL 1 was 0%. She also had strata testing done at [facility] which did not show any actionable mutations.  Repeat PET CT scan on 08/29/2018 showed new  enlarged hypermetabolic right paratracheal and subcarinal and AP window as well as right hilar lymph nodes compatible with nodal metastatic disease. New and enlarging hypermetabolic irregular solid pulmonary nodules consistent with pulmonary metastases.Eribulin was started in April 2020 but patient had quick disease progression and has been switched to carboplatin and gemcitabine.Patient had significant neutropenia and thrombocytopenia despite giving Neulasta and therefore carboplatin had to be dropped    Interval history-tolerating chemotherapy well without any significant side effects.  She does not have any shortness of breath at rest but does report mild shortness of breath on exertion and uses as needed oxygen.  Denies other complaints at this time  ECOG PS- 1 Pain scale- 0   Review of systems- Review of Systems  Constitutional: Positive for malaise/fatigue. Negative for chills, fever and weight loss.  HENT: Negative for congestion, ear discharge and nosebleeds.   Eyes: Negative for blurred vision.  Respiratory: Negative for cough, hemoptysis, sputum production, shortness of breath and wheezing.   Cardiovascular: Negative for chest pain, palpitations, orthopnea and claudication.  Gastrointestinal: Negative for abdominal pain, blood in stool, constipation, diarrhea, heartburn, melena, nausea and vomiting.  Genitourinary: Negative for dysuria, flank pain, frequency, hematuria and urgency.  Musculoskeletal: Negative for back pain, joint pain and myalgias.  Skin: Negative for rash.  Neurological: Negative for dizziness, tingling, focal weakness, seizures, weakness and headaches.  Endo/Heme/Allergies: Does not bruise/bleed easily.  Psychiatric/Behavioral: Negative for depression and suicidal ideas. The patient does not have insomnia.       Allergies  Allergen Reactions  . Penicillins Rash     Past Medical History:  Diagnosis Date  . Anemia   . Breast cancer ([place])   . Breast  cancer, right ([place]) 04/2017   Hx Lumpectomy,  Chemo + Rad tx's.  . Breast cyst, right    aspirated by Dr. Bary Castilla  . Hyperlipidemia   . Personal history of chemotherapy   . Pre-diabetes      Past Surgical History:  Procedure Laterality Date  . AXILLARY LYMPH NODE BIOPSY Right 04/09/2017   Procedure: AXILLARY LYMPH NODE BIOPSY;  Surgeon: Robert Bellow, MD;  Location: [facility] ORS;  Service: General;  Laterality: Right;  . BREAST BIOPSY Right 03/27/2017   US guided breast mass - invasive mammary carcinoma  . BREAST BIOPSY Right 03/27/2017   Lymph node - metastatic carcinoma  . BREAST BIOPSY Right 04/09/2017   Lymph node   . BREAST CYST ASPIRATION Right 11/2009   Dr. Bary Castilla did FNA  . BREAST EXCISIONAL BIOPSY Right 09/28/2017   lumpectomy and 12 lympnode rad neo adj chemo  . COLONOSCOPY  2011  . DILATION AND CURETTAGE OF UTERUS     X3  . ENDOBRONCHIAL ULTRASOUND N/A 04/09/2017   Procedure: ENDOBRONCHIAL ULTRASOUND;  Surgeon: Laverle Hobby, MD;  Location: [facility] ORS;  Service: Pulmonary;  Laterality: N/A;  . ENDOMETRIAL ABLATION    . ENDOMETRIAL BIOPSY  09/2009  . PORTACATH PLACEMENT Left 04/09/2017   Procedure: INSERTION PORT-A-CATH;  Surgeon: Robert Bellow, MD;  Location: [facility] ORS;  Service: General;  Laterality: Left;    Social History   Socioeconomic History  . Marital status: Married    Spouse name: Not on file  . Number of children: Not on file  . Years of education: Not on file  . Highest education level: Not on file  Occupational History  . Not on file  Tobacco Use  . Smoking status: Never Smoker  . Smokeless tobacco: Never Used  Substance and Sexual Activity  . Alcohol use: Not Currently  . Drug use: No  . Sexual activity: Not Currently  Other Topics Concern  . Not on file  Social History Narrative  . Not on file   Social Determinants of Health   Financial Resource Strain:   . Difficulty of Paying Living Expenses: Not on file  Food  Insecurity:   . Worried About Charity fundraiser in the Last Year: Not on file  . Ran Out of Food in the Last Year: Not on file  Transportation Needs:   . Lack of Transportation (Medical): Not on file  . Lack of Transportation (Non-Medical): Not on file  Physical Activity:   . Days of Exercise per Week: Not on file  . Minutes of Exercise per Session: Not on file  Stress:   . Feeling of Stress : Not on file  Social Connections:   . Frequency of Communication with Friends and Family: Not on file  . Frequency of Social Gatherings with Friends and Family: Not on file  . Attends Religious Services: Not on file  . Active Member of Clubs or Organizations: Not on file  . Attends Archivist Meetings: Not on file  . Marital Status: Not on file  Intimate Partner Violence:   . Fear of Current or Ex-Partner: Not on file  . Emotionally Abused: Not on file  . Physically Abused: Not on file  . Sexually Abused: Not on file    Family History  Problem Relation Age of Onset  . Melanoma Maternal Grandmother 33       currently 41  . Brain cancer Maternal Grandfather 33       unk. type; deceased in 53s  . Melanoma Other 90  mat grandmother's father  . Melanoma Father 61       on head; currently 3  . Prostate cancer Maternal Uncle        3 maternal uncles; dx in 76s  . Breast cancer Other        mat grandfather's sister; dx 2s     Current Outpatient Medications:  .  Biotin 2500 MCG CAPS, Take 5,000 mcg/day by mouth daily., Disp: , Rfl:  .  Cholecalciferol (VITAMIN D3) 10000 units TABS, Take by mouth 1 day or 1 dose., Disp: , Rfl:  .  Ferrous Sulfate (IRON) 325 (65 Fe) MG TABS, Take 1 tablet by mouth daily., Disp: , Rfl:  .  Fluticasone Furoate (ARNUITY ELLIPTA) 100 MCG/ACT AEPB, Inhale 1 puff into the lungs daily., Disp: 30 each, Rfl: 3 .  Multiple Vitamins-Minerals (CENTRUM SILVER ADULT 50+) TABS, Take 1 tablet by mouth daily., Disp: , Rfl:  .  Omega-3 Fatty Acids (FISH  OIL) 1000 MG CAPS, Take 1 capsule by mouth 1 day or 1 dose., Disp: , Rfl:  .  pantoprazole (PROTONIX) 20 MG tablet, Take 1 tablet (20 mg total) by mouth daily., Disp: 90 tablet, Rfl: 1 .  predniSONE (DELTASONE) 10 MG tablet, Take 1/2 tablet daily (5 mg) or as directed by MD., Disp: 30 tablet, Rfl: 3 .  sulfamethoxazole-trimethoprim (BACTRIM DS) 800-160 MG tablet, Take 1 tablet by mouth 3 (three) times a week., Disp: 12 tablet, Rfl: 0 .  benzonatate (TESSALON PERLES) 100 MG capsule, Take 2 capsules (200 mg total) by mouth 3 (three) times daily. (Patient not taking: Reported on 06/25/2019), Disp: 90 capsule, Rfl: 2 .  dexamethasone (DECADRON) 4 MG tablet, Take 1 tablet (4 mg total) by mouth 2 (two) times daily with a meal. (Patient not taking: Reported on 06/25/2019), Disp: 20 tablet, Rfl: 0 .  HYDROcodone-homatropine (HYCODAN) 5-1.5 MG/5ML syrup, Take 5 mLs by mouth every 6 (six) hours as needed for cough. (Patient not taking: Reported on 06/25/2019), Disp: 240 mL, Rfl: 0 No current facility-administered medications for this visit.  Facility-Administered Medications Ordered in Other Visits:  .  Tbo-Filgrastim (GRANIX) injection 480 mcg, 480 mcg, Subcutaneous, Daily, Sindy Guadeloupe, MD, 480 mcg at 05/19/17 1412  Physical exam:  Vitals:   06/26/19 1032  BP: 114/85  Pulse: 92  Temp: 98.7 F (37.1 C)  TempSrc: Tympanic  Weight: 201 lb (91.2 kg)  Height: 5' 3"  (1.6 m)   Physical Exam Constitutional:      General: She is not in acute distress. HENT:     Head: Normocephalic and atraumatic.  Eyes:     Pupils: Pupils are equal, round, and reactive to light.  Cardiovascular:     Rate and Rhythm: Normal rate and regular rhythm.     Heart sounds: Normal heart sounds.  Pulmonary:     Effort: Pulmonary effort is normal.     Breath sounds: Normal breath sounds.  Abdominal:     General: Bowel sounds are normal.     Palpations: Abdomen is soft.  Musculoskeletal:     Cervical back: Normal range of  motion.  Skin:    General: Skin is warm and dry.  Neurological:     Mental Status: She is alert and oriented to person, place, and time.      CMP Latest Ref Rng & Units 06/05/2019  Glucose 70 - 99 mg/dL 111(H)  BUN 6 - 20 mg/dL 19  Creatinine 0.44 - 1.00 mg/dL 0.63  Sodium 135 - 145 mmol/L  140  Potassium 3.5 - 5.1 mmol/L 3.3(L)  Chloride 98 - 111 mmol/L 108  CO2 22 - 32 mmol/L 23  Calcium 8.9 - 10.3 mg/dL 8.6(L)  Total Protein 6.5 - 8.1 g/dL 5.8(L)  Total Bilirubin 0.3 - 1.2 mg/dL 0.3  Alkaline Phos 38 - 126 U/L 80  AST 15 - 41 U/L 17  ALT 0 - 44 U/L 23   CBC Latest Ref Rng & Units 06/05/2019  WBC 4.0 - 10.5 K/uL 4.2  Hemoglobin 12.0 - 15.0 g/dL 10.8(L)  Hematocrit 36.0 - 46.0 % 36.5  Platelets 150 - 400 K/uL 141(L)      Assessment and plan- Patient is a 59 y.o. female with stage IV triple negative breast cancer with metastases to the lungs and mediastinal adenopathy. She is here for on treatment assessment prior to cycle 7-day 1 of gemcitabine  I reviewed labs done at [facility] from 06/24/2019.  CBC showed a normal white count of 7.8 with an ANC of 5.6, H&H of 11.2/34.3 and a platelet count of 192.  LFTs were normal except for a mildly elevated alkaline phosphatase of 152.  Counts are okay to proceed with cycle 7-day 1 of gemcitabine today.  She will directly proceed for cycle 7-day 8 next week with on pro-Neulasta support.  I will see her back in 3 weeks for cycle 8-day 1.  I will plan to get interim CT chest abdomen and pelvis with contrast before the next cycle to assess response to treatment so far    Visit Diagnosis 1. 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 Encounter for antineoplastic chemotherapy      Dr. Randa Evens, MD, MPH [facility] at [facility] [phone] 06/27/2019 12:29 PM

## 2019-07-02 ENCOUNTER — Other Ambulatory Visit: Payer: Self-pay

## 2019-07-02 ENCOUNTER — Encounter: Payer: Self-pay | Admitting: Oncology

## 2019-07-03 ENCOUNTER — Inpatient Hospital Stay: Payer: Managed Care, Other (non HMO)

## 2019-07-03 ENCOUNTER — Other Ambulatory Visit: Payer: Self-pay

## 2019-07-03 ENCOUNTER — Ambulatory Visit: Payer: Managed Care, Other (non HMO)

## 2019-07-03 VITALS — BP 143/85 | HR 99 | Temp 96.4°F | Wt 203.0 lb

## 2019-07-03 DIAGNOSIS — Z5111 Encounter for antineoplastic chemotherapy: Secondary | ICD-10-CM | POA: Diagnosis not present

## 2019-07-03 DIAGNOSIS — C50411 Malignant neoplasm of upper-outer quadrant of right female breast: Secondary | ICD-10-CM

## 2019-07-03 MED ORDER — HEPARIN SOD (PORK) LOCK FLUSH 100 UNIT/ML IV SOLN
INTRAVENOUS | Status: AC
  Filled 2019-07-03: qty 5

## 2019-07-03 MED ORDER — PEGFILGRASTIM 6 MG/0.6ML ~~LOC~~ PSKT
6.0000 mg | PREFILLED_SYRINGE | Freq: Once | SUBCUTANEOUS | Status: AC
  Administered 2019-07-03: 6 mg via SUBCUTANEOUS
  Filled 2019-07-03: qty 0.6

## 2019-07-03 MED ORDER — SODIUM CHLORIDE 0.9% FLUSH
10.0000 mL | INTRAVENOUS | Status: DC | PRN
  Administered 2019-07-03: 10 mL
  Filled 2019-07-03: qty 10

## 2019-07-03 MED ORDER — PROCHLORPERAZINE MALEATE 10 MG PO TABS
10.0000 mg | ORAL_TABLET | Freq: Once | ORAL | Status: AC
  Administered 2019-07-03: 10 mg via ORAL
  Filled 2019-07-03: qty 1

## 2019-07-03 MED ORDER — SODIUM CHLORIDE 0.9 % IV SOLN
1600.0000 mg | Freq: Once | INTRAVENOUS | Status: AC
  Administered 2019-07-03: 1600 mg via INTRAVENOUS
  Filled 2019-07-03: qty 26.3

## 2019-07-03 MED ORDER — SODIUM CHLORIDE 0.9 % IV SOLN
Freq: Once | INTRAVENOUS | Status: AC
  Filled 2019-07-03: qty 250

## 2019-07-03 MED ORDER — HEPARIN SOD (PORK) LOCK FLUSH 100 UNIT/ML IV SOLN
500.0000 [IU] | Freq: Once | INTRAVENOUS | Status: AC | PRN
  Administered 2019-07-03: 500 [IU]
  Filled 2019-07-03: qty 5

## 2019-07-04 ENCOUNTER — Ambulatory Visit: Payer: Managed Care, Other (non HMO)

## 2019-07-04 NOTE — Telephone Encounter (Signed)
Dr. Gonzalez, please advise. Thanks 

## 2019-07-04 NOTE — Telephone Encounter (Signed)
Stay on the 10 mg. This may be "new normal" while on tumor therapy.

## 2019-07-09 ENCOUNTER — Ambulatory Visit
Admission: RE | Admit: 2019-07-09 | Discharge: 2019-07-09 | Disposition: A | Discharge status: Home / Self Care | Payer: Managed Care, Other (non HMO) | Source: Ambulatory Visit | Attending: Oncology | Admitting: Oncology

## 2019-07-09 ENCOUNTER — Other Ambulatory Visit: Payer: Self-pay

## 2019-07-09 DIAGNOSIS — C7800 Secondary malignant neoplasm of unspecified lung: Secondary | ICD-10-CM | POA: Insufficient documentation

## 2019-07-09 DIAGNOSIS — C50911 Malignant neoplasm of unspecified site of right female breast: Secondary | ICD-10-CM | POA: Insufficient documentation

## 2019-07-09 MED ORDER — IOHEXOL 300 MG/ML  SOLN
100.0000 mL | Freq: Once | INTRAMUSCULAR | Status: AC | PRN
  Administered 2019-07-09: 100 mL via INTRAVENOUS

## 2019-07-15 NOTE — Telephone Encounter (Signed)
Dr. Patsey Berthold can you please advise on the below message. Thank you.

## 2019-07-16 ENCOUNTER — Encounter: Payer: Self-pay | Admitting: Oncology

## 2019-07-16 ENCOUNTER — Other Ambulatory Visit: Payer: Self-pay

## 2019-07-16 MED ORDER — PREDNISONE 10 MG PO TABS: ORAL_TABLET | ORAL | 3 refills | Status: DC

## 2019-07-16 NOTE — Telephone Encounter (Signed)
Can refill Prednisone 10 mg, one tablet daily give 3 refills. Since she is only at 10 mg she will not need the Bactrim.

## 2019-07-17 ENCOUNTER — Inpatient Hospital Stay: Payer: Managed Care, Other (non HMO)

## 2019-07-17 ENCOUNTER — Other Ambulatory Visit: Payer: Self-pay

## 2019-07-17 ENCOUNTER — Encounter: Payer: Self-pay | Admitting: Oncology

## 2019-07-17 ENCOUNTER — Inpatient Hospital Stay: Payer: Managed Care, Other (non HMO) | Attending: Oncology | Admitting: Oncology

## 2019-07-17 VITALS — BP 112/67 | HR 97 | Temp 97.4°F | Resp 16 | Wt 205.0 lb

## 2019-07-17 DIAGNOSIS — J189 Pneumonia, unspecified organism: Secondary | ICD-10-CM

## 2019-07-17 DIAGNOSIS — C50411 Malignant neoplasm of upper-outer quadrant of right female breast: Secondary | ICD-10-CM | POA: Diagnosis not present

## 2019-07-17 DIAGNOSIS — Z79899 Other long term (current) drug therapy: Secondary | ICD-10-CM | POA: Diagnosis not present

## 2019-07-17 DIAGNOSIS — D696 Thrombocytopenia, unspecified: Secondary | ICD-10-CM | POA: Insufficient documentation

## 2019-07-17 DIAGNOSIS — Z923 Personal history of irradiation: Secondary | ICD-10-CM | POA: Insufficient documentation

## 2019-07-17 DIAGNOSIS — Z808 Family history of malignant neoplasm of other organs or systems: Secondary | ICD-10-CM | POA: Diagnosis not present

## 2019-07-17 DIAGNOSIS — R0602 Shortness of breath: Secondary | ICD-10-CM | POA: Insufficient documentation

## 2019-07-17 DIAGNOSIS — Z880 Allergy status to penicillin: Secondary | ICD-10-CM | POA: Diagnosis not present

## 2019-07-17 DIAGNOSIS — C771 Secondary and unspecified malignant neoplasm of intrathoracic lymph nodes: Secondary | ICD-10-CM | POA: Diagnosis not present

## 2019-07-17 DIAGNOSIS — C7800 Secondary malignant neoplasm of unspecified lung: Secondary | ICD-10-CM | POA: Diagnosis not present

## 2019-07-17 DIAGNOSIS — Z171 Estrogen receptor negative status [ER-]: Secondary | ICD-10-CM | POA: Diagnosis not present

## 2019-07-17 DIAGNOSIS — Z5111 Encounter for antineoplastic chemotherapy: Secondary | ICD-10-CM | POA: Diagnosis present

## 2019-07-17 DIAGNOSIS — J90 Pleural effusion, not elsewhere classified: Secondary | ICD-10-CM | POA: Insufficient documentation

## 2019-07-17 DIAGNOSIS — Z7952 Long term (current) use of systemic steroids: Secondary | ICD-10-CM | POA: Diagnosis not present

## 2019-07-17 DIAGNOSIS — Z8042 Family history of malignant neoplasm of prostate: Secondary | ICD-10-CM | POA: Insufficient documentation

## 2019-07-17 DIAGNOSIS — Z803 Family history of malignant neoplasm of breast: Secondary | ICD-10-CM | POA: Diagnosis not present

## 2019-07-17 DIAGNOSIS — Z9221 Personal history of antineoplastic chemotherapy: Secondary | ICD-10-CM | POA: Diagnosis not present

## 2019-07-17 DIAGNOSIS — Z5189 Encounter for other specified aftercare: Secondary | ICD-10-CM | POA: Insufficient documentation

## 2019-07-17 DIAGNOSIS — I700 Atherosclerosis of aorta: Secondary | ICD-10-CM | POA: Insufficient documentation

## 2019-07-17 DIAGNOSIS — Z8616 Personal history of COVID-19: Secondary | ICD-10-CM | POA: Insufficient documentation

## 2019-07-17 MED ORDER — PALONOSETRON HCL INJECTION 0.25 MG/5ML
0.2500 mg | Freq: Once | INTRAVENOUS | Status: AC
  Administered 2019-07-17: 0.25 mg via INTRAVENOUS
  Filled 2019-07-17: qty 5

## 2019-07-17 MED ORDER — HEPARIN SOD (PORK) LOCK FLUSH 100 UNIT/ML IV SOLN
500.0000 [IU] | Freq: Once | INTRAVENOUS | Status: AC | PRN
  Administered 2019-07-17: 500 [IU]
  Filled 2019-07-17: qty 5

## 2019-07-17 MED ORDER — PROCHLORPERAZINE MALEATE 10 MG PO TABS
10.0000 mg | ORAL_TABLET | Freq: Once | ORAL | Status: AC
  Administered 2019-07-17: 10 mg via ORAL
  Filled 2019-07-17: qty 1

## 2019-07-17 MED ORDER — HEPARIN SOD (PORK) LOCK FLUSH 100 UNIT/ML IV SOLN
INTRAVENOUS | Status: AC
  Filled 2019-07-17: qty 5

## 2019-07-17 MED ORDER — SODIUM CHLORIDE 0.9 % IV SOLN
1600.0000 mg | Freq: Once | INTRAVENOUS | Status: AC
  Administered 2019-07-17: 1600 mg via INTRAVENOUS
  Filled 2019-07-17: qty 26.3

## 2019-07-17 MED ORDER — SODIUM CHLORIDE 0.9 % IV SOLN
Freq: Once | INTRAVENOUS | Status: AC
  Filled 2019-07-17: qty 250

## 2019-07-17 NOTE — Progress Notes (Signed)
Pt does not do well with breathing when she goes to 5 mg predisone, when she gets on that dose she is sob on exertion and pulse goes up in 100's. She went to PCP and she is on 10 mg prednisone and she has oxygen on exertion. She states that PCP said to stop bactrim.

## 2019-07-18 ENCOUNTER — Ambulatory Visit: Payer: Managed Care, Other (non HMO)

## 2019-07-18 NOTE — Progress Notes (Signed)
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 Her only site of metastatic disease was biopsy-proven hilar adenopathy. She was seen for second opinion at [facility] and underwent neoadjuvant chemotherapy with carbotaxol followed by dose dense AC. Posttreatment scans showed excellent response to treatment with resolution of hilar adenopathy as well as axillary adenopathy but persistent primary tumor at the right breast. She then went on to get right-sided lumpectomy along with axillary lymph node dissection. Final pathology showed 16 mm residual tumor. 0/12 Ln positive for malignancy. Grade 2. Negative margins. No LVI. ER, PR and her 2 neu negative. She also completed radiation therapy to her chest wall and hilar region at in August 2019. She then completed adjuvant xeloda.Patient also had radiation pneumonitis and was treated with a prolonged course of steroids by radiation oncology at [facility].  Patient has had baseline foundation 1 testing done which did not show any evidence of actionable mutations. PDL 1 was 0%. She also had strata testing done at [facility] which did not show any actionable mutations.  Repeat PET CT scan on 08/29/2018 showed new  enlarged hypermetabolic right paratracheal and subcarinal and AP window as well as right hilar lymph nodes compatible with nodal metastatic disease. New and enlarging hypermetabolic irregular solid pulmonary nodules consistent with pulmonary metastases.Eribulin was started in April 2020 but patient had quick disease progression and has been switched to carboplatin and gemcitabine.Patient had significant neutropenia and thrombocytopenia despite giving Neulasta and therefore carboplatin had to be dropped  Interval history-she has been seeing pulmonary to see if she can be weaned off prednisone.  However when her prednisone dose was decreased to 5 mg she noticed more exertional shortness of breath and her prednisone had to be bumped back up to 10 mg.  She reports she is doing a little better with this dose but still needs oxygen on ambulation.  Denies other complaints at this time  ECOG PS- 1 Pain scale- 0   Review of systems- Review of Systems  Constitutional: Negative for chills, fever, malaise/fatigue and weight loss.  HENT: Negative for congestion, ear discharge and nosebleeds.   Eyes: Negative for blurred vision.  Respiratory: Positive for shortness of breath. Negative for cough, hemoptysis, sputum production and wheezing.   Cardiovascular: Negative for chest pain, palpitations, orthopnea and claudication.  Gastrointestinal: Negative for abdominal pain, blood in stool, constipation, diarrhea, heartburn, melena, nausea and vomiting.  Genitourinary: Negative for dysuria, flank pain, frequency, hematuria and urgency.  Musculoskeletal: Negative for back pain, joint pain and myalgias.  Skin: Negative for rash.  Neurological: Negative for dizziness, tingling, focal weakness, seizures, weakness and headaches.  Endo/Heme/Allergies: Does not bruise/bleed easily.  Psychiatric/Behavioral: Negative for depression and suicidal ideas. The patient does not have insomnia.       Allergies  Allergen  Reactions  . Penicillins Rash  Past Medical History:  Diagnosis Date  . Anemia   . Breast cancer ([place])   . Breast cancer, right ([place]) 04/2017   Hx Lumpectomy, Chemo + Rad tx's.  . Breast cyst, right    aspirated by Dr. Bary Castilla  . Hyperlipidemia   . Personal history of chemotherapy   . Pre-diabetes      Past Surgical History:  Procedure Laterality Date  . AXILLARY LYMPH NODE BIOPSY Right 04/09/2017   Procedure: AXILLARY LYMPH NODE BIOPSY;  Surgeon: Robert Bellow, MD;  Location: [facility] ORS;  Service: General;  Laterality: Right;  . BREAST BIOPSY Right 03/27/2017   US guided breast mass - invasive mammary carcinoma  . BREAST BIOPSY Right 03/27/2017   Lymph node - metastatic carcinoma  . BREAST BIOPSY Right 04/09/2017   Lymph node   . BREAST CYST ASPIRATION Right 11/2009   Dr. Bary Castilla did FNA  . BREAST EXCISIONAL BIOPSY Right 09/28/2017   lumpectomy and 12 lympnode rad neo adj chemo  . COLONOSCOPY  2011  . DILATION AND CURETTAGE OF UTERUS     X3  . ENDOBRONCHIAL ULTRASOUND N/A 04/09/2017   Procedure: ENDOBRONCHIAL ULTRASOUND;  Surgeon: Laverle Hobby, MD;  Location: [facility] ORS;  Service: Pulmonary;  Laterality: N/A;  . ENDOMETRIAL ABLATION    . ENDOMETRIAL BIOPSY  09/2009  . PORTACATH PLACEMENT Left 04/09/2017   Procedure: INSERTION PORT-A-CATH;  Surgeon: Robert Bellow, MD;  Location: [facility] ORS;  Service: General;  Laterality: Left;    Social History   Socioeconomic History  . Marital status: Married    Spouse name: Not on file  . Number of children: Not on file  . Years of education: Not on file  . Highest education level: Not on file  Occupational History  . Not on file  Tobacco Use  . Smoking status: Never Smoker  . Smokeless tobacco: Never Used  Substance and Sexual Activity  . Alcohol use: Not Currently  . Drug use: No  . Sexual activity: Not Currently  Other Topics Concern  . Not on file  Social History Narrative  . Not on file   Social  Determinants of Health   Financial Resource Strain:   . Difficulty of Paying Living Expenses: Not on file  Food Insecurity:   . Worried About Charity fundraiser in the Last Year: Not on file  . Ran Out of Food in the Last Year: Not on file  Transportation Needs:   . Lack of Transportation (Medical): Not on file  . Lack of Transportation (Non-Medical): Not on file  Physical Activity:   . Days of Exercise per Week: Not on file  . Minutes of Exercise per Session: Not on file  Stress:   . Feeling of Stress : Not on file  Social Connections:   . Frequency of Communication with Friends and Family: Not on file  . Frequency of Social Gatherings with Friends and Family: Not on file  . Attends Religious Services: Not on file  . Active Member of Clubs or Organizations: Not on file  . Attends Archivist Meetings: Not on file  . Marital Status: Not on file  Intimate Partner Violence:   . Fear of Current or Ex-Partner: Not on file  . Emotionally Abused: Not on file  . Physically Abused: Not on file  . Sexually Abused: Not on file    Family History  Problem Relation Age of Onset  . Melanoma Maternal Grandmother 51       currently 44  .  Brain cancer Maternal Grandfather 67       unk. type; deceased in 74s  . Melanoma Other 10       mat grandmother's father  . Melanoma Father 67       on head; currently 74  . Prostate cancer Maternal Uncle        3 maternal uncles; dx in 67s  . Breast cancer Other        mat grandfather's sister; dx 39s     Current Outpatient Medications:  .  benzonatate (TESSALON PERLES) 100 MG capsule, Take 2 capsules (200 mg total) by mouth 3 (three) times daily. (Patient taking differently: Take 200 mg by mouth 3 (three) times daily as needed. ), Disp: 90 capsule, Rfl: 2 .  Biotin 2500 MCG CAPS, Take 5,000 mcg/day by mouth daily., Disp: , Rfl:  .  Cholecalciferol (VITAMIN D3) 10000 units TABS, Take by mouth 1 day or 1 dose., Disp: , Rfl:  .   Fluticasone Furoate (ARNUITY ELLIPTA) 100 MCG/ACT AEPB, Inhale 1 puff into the lungs daily., Disp: 30 each, Rfl: 3 .  HYDROcodone-homatropine (HYCODAN) 5-1.5 MG/5ML syrup, Take 5 mLs by mouth every 6 (six) hours as needed for cough., Disp: 240 mL, Rfl: 0 .  Multiple Vitamins-Minerals (CENTRUM SILVER ADULT 50+) TABS, Take 1 tablet by mouth daily., Disp: , Rfl:  .  Omega-3 Fatty Acids (FISH OIL) 1000 MG CAPS, Take 1 capsule by mouth 1 day or 1 dose., Disp: , Rfl:  .  pantoprazole (PROTONIX) 20 MG tablet, Take 1 tablet (20 mg total) by mouth daily., Disp: 90 tablet, Rfl: 1 .  predniSONE (DELTASONE) 10 MG tablet, 1 tablet daily., Disp: 30 tablet, Rfl: 3 .  Ferrous Sulfate (IRON) 325 (65 Fe) MG TABS, Take 1 tablet by mouth daily., Disp: , Rfl:  .  sulfamethoxazole-trimethoprim (BACTRIM DS) 800-160 MG tablet, Take 1 tablet by mouth 3 (three) times a week. (Patient not taking: Reported on 07/17/2019), Disp: 12 tablet, Rfl: 0 No current facility-administered medications for this visit.  Facility-Administered Medications Ordered in Other Visits:  .  Tbo-Filgrastim (GRANIX) injection 480 mcg, 480 mcg, Subcutaneous, Daily, Sindy Guadeloupe, MD, 480 mcg at 05/19/17 1412  Physical exam:  Vitals:   07/17/19 0953 07/17/19 1002  BP: 112/67   Pulse: 97   Resp: 16   Temp: (!) 97.4 F (36.3 C)   TempSrc: Tympanic   SpO2:  97%  Weight: 205 lb (93 kg)    Physical Exam Constitutional:      Comments: On oxygen.  Appears in no acute distress  HENT:     Head: Normocephalic and atraumatic.  Eyes:     Pupils: Pupils are equal, round, and reactive to light.  Cardiovascular:     Rate and Rhythm: Normal rate and regular rhythm.     Heart sounds: Normal heart sounds.  Pulmonary:     Effort: Pulmonary effort is normal.     Breath sounds: Normal breath sounds.  Abdominal:     General: Bowel sounds are normal.     Palpations: Abdomen is soft.  Musculoskeletal:     Cervical back: Normal range of motion.   Skin:    General: Skin is warm and dry.  Neurological:     Mental Status: She is alert and oriented to person, place, and time.      CMP Latest Ref Rng & Units 06/05/2019  Glucose 70 - 99 mg/dL 111(H)  BUN 6 - 20 mg/dL 19  Creatinine 0.44 - 1.00  mg/dL 0.63  Sodium 135 - 145 mmol/L 140  Potassium 3.5 - 5.1 mmol/L 3.3(L)  Chloride 98 - 111 mmol/L 108  CO2 22 - 32 mmol/L 23  Calcium 8.9 - 10.3 mg/dL 8.6(L)  Total Protein 6.5 - 8.1 g/dL 5.8(L)  Total Bilirubin 0.3 - 1.2 mg/dL 0.3  Alkaline Phos 38 - 126 U/L 80  AST 15 - 41 U/L 17  ALT 0 - 44 U/L 23   CBC Latest Ref Rng & Units 06/05/2019  WBC 4.0 - 10.5 K/uL 4.2  Hemoglobin 12.0 - 15.0 g/dL 10.8(L)  Hematocrit 36.0 - 46.0 % 36.5  Platelets 150 - 400 K/uL 141(L)    No images are attached to the encounter.  CT Chest W Contrast  Result Date: 07/09/2019 CLINICAL DATA:  Restaging metastatic breast cancer, COVID-19 05/2019 EXAM: CT CHEST, ABDOMEN, AND PELVIS WITH CONTRAST TECHNIQUE: Multidetector CT imaging of the chest, abdomen and pelvis was performed following the standard protocol during bolus administration of intravenous contrast. CONTRAST:  137m OMNIPAQUE IOHEXOL 300 MG/ML SOLN, additional oral enteric contrast COMPARISON:  04/04/2019, 01/02/2019 FINDINGS: CT CHEST FINDINGS Cardiovascular: Left chest port catheter. Aortic atherosclerosis. Normal heart size. No pericardial effusion. Mediastinum/Nodes: No enlarged mediastinal, hilar, or axillary lymph nodes. Thyroid gland, trachea, and esophagus demonstrate no significant findings. Lungs/Pleura: Unchanged post treatment/radiation consolidation and fibrosis of the perihilar right lung. Unchanged small right, trace left pleural effusions. There has been near total resolution of previously seen fine, randomly distributed pulmonary nodularity, with occasional small persistent subpleural nodules, for example in the right apex (series 3, image 31). There is persistent interstitial septal  thickening. There are new, scattered, predominantly peripheral and subpleural ground-glass opacities Musculoskeletal: No chest wall mass or suspicious bone lesions identified. Status post right lumpectomy and axillary lymph node dissection with skin thickening of the right breast. CT ABDOMEN PELVIS FINDINGS Hepatobiliary: No solid liver abnormality is seen. No gallstones, gallbladder wall thickening, or biliary dilatation. Pancreas: Unremarkable. No pancreatic ductal dilatation or surrounding inflammatory changes. Spleen: Normal in size without significant abnormality. Adrenals/Urinary Tract: Adrenal glands are unremarkable. Kidneys are normal, without renal calculi, solid lesion, or hydronephrosis. Bladder is unremarkable. Stomach/Bowel: Stomach is within normal limits. Appendix appears normal. No evidence of bowel wall thickening, distention, or inflammatory changes. Vascular/Lymphatic: Aortic atherosclerosis. No enlarged abdominal or pelvic lymph nodes. Reproductive: No mass or other abnormality. Other: No abdominal wall hernia or abnormality. No abdominopelvic ascites. Musculoskeletal: No acute or significant osseous findings. IMPRESSION: 1. Unchanged post treatment/radiation consolidation of fibrosis of the perihilar right lung. 2. Near total resolution of previously seen fine, randomly distributed pulmonary nodularity, with occasional small persistent subpleural nodules, for example in the right apex. This may reflect treatment response of metastatic disease or alternately resolution of atypical infection, unclear and very difficult to distinguish. 3. Persistent interstitial septal thickening and small right, trace left pleural effusions, consistent with edema and suspicious for a component of lymphangitic metastasis. 4. Interval development of scattered, predominantly peripheral and subpleural ground-glass opacities, which are generally consistent with reported history of recent COVID-19 infection, although  also similar in appearance and morphology to ground-glass opacities seen on prior examinations. Differential considerations further include drug toxicity given ongoing chemotherapy. 5. Redemonstrated postoperative findings of right breast lumpectomy. 6. No evidence of metastatic disease within the abdomen or pelvis. 7.  Aortic Atherosclerosis (ICD10-I70.0). Electronically Signed   By: AEddie CandleM.D.   On: 07/09/2019 10:46   CT Abdomen Pelvis W Contrast  Result Date: 07/09/2019 CLINICAL DATA:  Restaging metastatic  breast cancer, COVID-19 05/2019 EXAM: CT CHEST, ABDOMEN, AND PELVIS WITH CONTRAST TECHNIQUE: Multidetector CT imaging of the chest, abdomen and pelvis was performed following the standard protocol during bolus administration of intravenous contrast. CONTRAST:  183m OMNIPAQUE IOHEXOL 300 MG/ML SOLN, additional oral enteric contrast COMPARISON:  04/04/2019, 01/02/2019 FINDINGS: CT CHEST FINDINGS Cardiovascular: Left chest port catheter. Aortic atherosclerosis. Normal heart size. No pericardial effusion. Mediastinum/Nodes: No enlarged mediastinal, hilar, or axillary lymph nodes. Thyroid gland, trachea, and esophagus demonstrate no significant findings. Lungs/Pleura: Unchanged post treatment/radiation consolidation and fibrosis of the perihilar right lung. Unchanged small right, trace left pleural effusions. There has been near total resolution of previously seen fine, randomly distributed pulmonary nodularity, with occasional small persistent subpleural nodules, for example in the right apex (series 3, image 31). There is persistent interstitial septal thickening. There are new, scattered, predominantly peripheral and subpleural ground-glass opacities Musculoskeletal: No chest wall mass or suspicious bone lesions identified. Status post right lumpectomy and axillary lymph node dissection with skin thickening of the right breast. CT ABDOMEN PELVIS FINDINGS Hepatobiliary: No solid liver abnormality is  seen. No gallstones, gallbladder wall thickening, or biliary dilatation. Pancreas: Unremarkable. No pancreatic ductal dilatation or surrounding inflammatory changes. Spleen: Normal in size without significant abnormality. Adrenals/Urinary Tract: Adrenal glands are unremarkable. Kidneys are normal, without renal calculi, solid lesion, or hydronephrosis. Bladder is unremarkable. Stomach/Bowel: Stomach is within normal limits. Appendix appears normal. No evidence of bowel wall thickening, distention, or inflammatory changes. Vascular/Lymphatic: Aortic atherosclerosis. No enlarged abdominal or pelvic lymph nodes. Reproductive: No mass or other abnormality. Other: No abdominal wall hernia or abnormality. No abdominopelvic ascites. Musculoskeletal: No acute or significant osseous findings. IMPRESSION: 1. Unchanged post treatment/radiation consolidation of fibrosis of the perihilar right lung. 2. Near total resolution of previously seen fine, randomly distributed pulmonary nodularity, with occasional small persistent subpleural nodules, for example in the right apex. This may reflect treatment response of metastatic disease or alternately resolution of atypical infection, unclear and very difficult to distinguish. 3. Persistent interstitial septal thickening and small right, trace left pleural effusions, consistent with edema and suspicious for a component of lymphangitic metastasis. 4. Interval development of scattered, predominantly peripheral and subpleural ground-glass opacities, which are generally consistent with reported history of recent COVID-19 infection, although also similar in appearance and morphology to ground-glass opacities seen on prior examinations. Differential considerations further include drug toxicity given ongoing chemotherapy. 5. Redemonstrated postoperative findings of right breast lumpectomy. 6. No evidence of metastatic disease within the abdomen or pelvis. 7.  Aortic Atherosclerosis  (ICD10-I70.0). Electronically Signed   By: AEddie CandleM.D.   On: 07/09/2019 10:46     Assessment and plan- Patient is a 59y.o. female with stage IV triple negative breast cancer with metastases to the lungs and mediastinal adenopathy.she is here for on treatment assessment prior to cycle 8 day 1 of gemcitabine  I have reviewed ct chest images independently and discussed findings with the patient. CT is difficult to interpret given her pre existing pneumonitis, ,etastatic breast cancer and recent covid infection. She does not have any over progressive disease noted on scans. She is still has ongoing septal thickening and possible lymphangitic involvement. Overall findings are indicative of stable disease. She will therefore continue gemcitabine until progression or toxicity. Labs at [facility] reviewed and ok to proceed with cycle 8 day1 of gemcitaine today and day 8 next week with on pro neulasta support.  I will see her back in 3 weeks for cycle 9.  Pneumonitis- she  is back on prednisone 10 mg and will likely remain at that dose. Being followed by pulmonary   Visit Diagnosis 1. Encounter for antineoplastic chemotherapy   2. Malignant neoplasm of upper-outer quadrant of right breast in female, estrogen receptor negative ([place])   3. Pneumonitis      Dr. Randa Evens, MD, MPH [facility] [phone] 07/18/2019 1:32 PM

## 2019-07-22 ENCOUNTER — Ambulatory Visit: Payer: Managed Care, Other (non HMO) | Admitting: Pulmonary Disease

## 2019-07-22 DIAGNOSIS — Z8616 Personal history of COVID-19: Secondary | ICD-10-CM

## 2019-07-22 DIAGNOSIS — C7800 Secondary malignant neoplasm of unspecified lung: Secondary | ICD-10-CM

## 2019-07-22 DIAGNOSIS — C50911 Malignant neoplasm of unspecified site of right female breast: Secondary | ICD-10-CM

## 2019-07-22 DIAGNOSIS — J189 Pneumonia, unspecified organism: Secondary | ICD-10-CM

## 2019-07-22 NOTE — Progress Notes (Signed)
    Assessment & Plan:  1. Pneumonitis (Primary)  2. 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 History of COVID-19   Patient Instructions  We will increase your prednisone  to 20 mg daily  We discussed that there are many factors causing your said that one of them was your recent COVID-19 infection  CT scan of the chest was nonspecific shows groundglass opacities which could be a side effect of the Gemzar  or residual from your COVID-19 infection  We will see you in follow-up in 3 to 4 weeks time   While you are on 20 mg of prednisone  you will need to take Bactrim  DS 1 tablet 3 times a week once we drop down this dose you can come off the Bactrim   Please note: late entry documentation due to logistical difficulties during COVID-19 pandemic. This note is filed for information purposes only, and is not intended to be used for billing, nor does it represent the full scope/nature of the visit in question. Please see any associated scanned media linked to date of encounter for additional pertinent information.  Subjective:    HPI: Ashley Molina is a 59 y.o. female presenting to the pulmonology clinic on 07/22/2019 with report of: No chief complaint on file.     Outpatient Encounter Medications as of 07/22/2019  Medication Sig Note   Biotin  2500 MCG CAPS Take 5,000 mcg/day by mouth at bedtime.     Cholecalciferol  (VITAMIN D3) 10000 units TABS Take 1 tablet by mouth at bedtime.     Ferrous Sulfate  (IRON) 325 (65 Fe) MG TABS Take 1 tablet by mouth at bedtime.     Fluticasone  Furoate (ARNUITY ELLIPTA ) 100 MCG/ACT AEPB Inhale 1 puff into the lungs daily.    Multiple Vitamins-Minerals (CENTRUM SILVER  ADULT 50+) TABS Take 1 tablet by mouth at bedtime.     pantoprazole  (PROTONIX ) 20 MG tablet Take 1 tablet (20 mg total) by mouth daily.    [DISCONTINUED] benzonatate  (TESSALON  PERLES) 100 MG capsule Take 2 capsules (200 mg total) by mouth 3 (three) times daily. (Patient  taking differently: Take 200 mg by mouth 3 (three) times daily as needed. )    [DISCONTINUED] HYDROcodone -homatropine (HYCODAN) 5-1.5 MG/5ML syrup Take 5 mLs by mouth every 6 (six) hours as needed for cough. (Patient not taking: Reported on 08/11/2019)    [DISCONTINUED] Omega-3 Fatty Acids (FISH OIL) 1000 MG CAPS Take 1 capsule by mouth at bedtime.     [DISCONTINUED] predniSONE  (DELTASONE ) 10 MG tablet 1 tablet daily. (Patient not taking: Reported on 08/07/2019) 08/07/2019: Not taking    [DISCONTINUED] prochlorperazine  (COMPAZINE ) 10 MG tablet Take 1 tablet (10 mg total) by mouth every 6 (six) hours as needed (Nausea or vomiting).    [DISCONTINUED] sulfamethoxazole -trimethoprim  (BACTRIM  DS) 800-160 MG tablet Take 1 tablet by mouth 3 (three) times a week. (Patient not taking: Reported on 07/17/2019)    [DISCONTINUED] Tbo-Filgrastim  (GRANIX ) injection 480 mcg     No facility-administered encounter medications on file as of 07/22/2019.      Objective:   There were no vitals filed for this visit.   Physical exam documentation is limited by delayed entry of information.

## 2019-07-22 NOTE — Patient Instructions (Signed)
We will increase your prednisone to 20 mg daily  We discussed that there are many factors causing your said that one of them was your recent COVID-19 infection  CT scan of the chest was nonspecific shows groundglass opacities which could be a side effect of the Gemzar or residual from your COVID-19 infection  We will see you in follow-up in 3 to 4 weeks time   While you are on 20 mg of prednisone you will need to take Bactrim DS 1 tablet 3 times a week once we drop down this dose you can come off the Bactrim

## 2019-07-23 ENCOUNTER — Other Ambulatory Visit: Payer: Self-pay

## 2019-07-23 ENCOUNTER — Encounter: Payer: Self-pay | Admitting: Oncology

## 2019-07-24 ENCOUNTER — Other Ambulatory Visit: Payer: Self-pay

## 2019-07-24 ENCOUNTER — Inpatient Hospital Stay: Payer: Managed Care, Other (non HMO)

## 2019-07-24 VITALS — BP 130/80 | HR 98 | Temp 97.2°F | Wt 206.0 lb

## 2019-07-24 DIAGNOSIS — C50411 Malignant neoplasm of upper-outer quadrant of right female breast: Secondary | ICD-10-CM

## 2019-07-24 DIAGNOSIS — Z5111 Encounter for antineoplastic chemotherapy: Secondary | ICD-10-CM | POA: Diagnosis not present

## 2019-07-24 MED ORDER — SODIUM CHLORIDE 0.9 % IV SOLN
1600.0000 mg | Freq: Once | INTRAVENOUS | Status: AC
  Administered 2019-07-24: 1600 mg via INTRAVENOUS
  Filled 2019-07-24: qty 26.3

## 2019-07-24 MED ORDER — SODIUM CHLORIDE 0.9% FLUSH
10.0000 mL | INTRAVENOUS | Status: DC | PRN
  Administered 2019-07-24: 10 mL
  Filled 2019-07-24: qty 10

## 2019-07-24 MED ORDER — PROCHLORPERAZINE MALEATE 10 MG PO TABS
10.0000 mg | ORAL_TABLET | Freq: Once | ORAL | Status: AC
  Administered 2019-07-24: 10 mg via ORAL
  Filled 2019-07-24: qty 1

## 2019-07-24 MED ORDER — SODIUM CHLORIDE 0.9 % IV SOLN
Freq: Once | INTRAVENOUS | Status: AC
  Filled 2019-07-24: qty 250

## 2019-07-24 MED ORDER — PEGFILGRASTIM 6 MG/0.6ML ~~LOC~~ PSKT
6.0000 mg | PREFILLED_SYRINGE | Freq: Once | SUBCUTANEOUS | Status: AC
  Administered 2019-07-24: 6 mg via SUBCUTANEOUS
  Filled 2019-07-24: qty 0.6

## 2019-07-24 MED ORDER — HEPARIN SOD (PORK) LOCK FLUSH 100 UNIT/ML IV SOLN
INTRAVENOUS | Status: AC
  Filled 2019-07-24: qty 5

## 2019-07-24 MED ORDER — HEPARIN SOD (PORK) LOCK FLUSH 100 UNIT/ML IV SOLN
500.0000 [IU] | Freq: Once | INTRAVENOUS | Status: AC | PRN
  Administered 2019-07-24: 500 [IU]
  Filled 2019-07-24: qty 5

## 2019-07-25 ENCOUNTER — Ambulatory Visit: Payer: Managed Care, Other (non HMO)

## 2019-07-28 MED ORDER — SULFAMETHOXAZOLE-TRIMETHOPRIM 800-160 MG PO TABS: ORAL_TABLET | ORAL | 0 refills | Status: DC

## 2019-07-31 ENCOUNTER — Encounter: Payer: Self-pay | Admitting: Oncology

## 2019-08-05 ENCOUNTER — Other Ambulatory Visit: Payer: Self-pay

## 2019-08-05 ENCOUNTER — Emergency Department: Payer: Managed Care, Other (non HMO)

## 2019-08-05 ENCOUNTER — Inpatient Hospital Stay
Admission: EM | Admit: 2019-08-05 | Discharge: 2019-08-07 | DRG: 177 | Disposition: A | Discharge status: Home / Self Care | Payer: Managed Care, Other (non HMO) | Attending: Hospitalist | Admitting: Hospitalist

## 2019-08-05 ENCOUNTER — Encounter: Payer: Self-pay | Admitting: *Deleted

## 2019-08-05 DIAGNOSIS — Z803 Family history of malignant neoplasm of breast: Secondary | ICD-10-CM

## 2019-08-05 DIAGNOSIS — U071 COVID-19: Secondary | ICD-10-CM | POA: Diagnosis not present

## 2019-08-05 DIAGNOSIS — C50911 Malignant neoplasm of unspecified site of right female breast: Secondary | ICD-10-CM | POA: Diagnosis present

## 2019-08-05 DIAGNOSIS — R042 Hemoptysis: Secondary | ICD-10-CM | POA: Diagnosis not present

## 2019-08-05 DIAGNOSIS — Z171 Estrogen receptor negative status [ER-]: Secondary | ICD-10-CM

## 2019-08-05 DIAGNOSIS — Z79899 Other long term (current) drug therapy: Secondary | ICD-10-CM

## 2019-08-05 DIAGNOSIS — Z9221 Personal history of antineoplastic chemotherapy: Secondary | ICD-10-CM

## 2019-08-05 DIAGNOSIS — Z79891 Long term (current) use of opiate analgesic: Secondary | ICD-10-CM

## 2019-08-05 DIAGNOSIS — Z8616 Personal history of COVID-19: Secondary | ICD-10-CM

## 2019-08-05 DIAGNOSIS — Z923 Personal history of irradiation: Secondary | ICD-10-CM

## 2019-08-05 DIAGNOSIS — Z880 Allergy status to penicillin: Secondary | ICD-10-CM

## 2019-08-05 DIAGNOSIS — E785 Hyperlipidemia, unspecified: Secondary | ICD-10-CM | POA: Diagnosis present

## 2019-08-05 DIAGNOSIS — J189 Pneumonia, unspecified organism: Secondary | ICD-10-CM | POA: Diagnosis present

## 2019-08-05 DIAGNOSIS — R7303 Prediabetes: Secondary | ICD-10-CM | POA: Diagnosis present

## 2019-08-05 DIAGNOSIS — Z7952 Long term (current) use of systemic steroids: Secondary | ICD-10-CM

## 2019-08-05 DIAGNOSIS — J9621 Acute and chronic respiratory failure with hypoxia: Secondary | ICD-10-CM | POA: Diagnosis present

## 2019-08-05 DIAGNOSIS — C7800 Secondary malignant neoplasm of unspecified lung: Secondary | ICD-10-CM | POA: Diagnosis present

## 2019-08-05 DIAGNOSIS — E8770 Fluid overload, unspecified: Secondary | ICD-10-CM | POA: Diagnosis present

## 2019-08-05 HISTORY — DX: COVID-19: U07.1

## 2019-08-05 LAB — CBC WITH DIFFERENTIAL/PLATELET
Abs Immature Granulocytes: 0.23 10*3/uL — ABNORMAL HIGH (ref 0.00–0.07)
Basophils Absolute: 0.1 10*3/uL (ref 0.0–0.1)
Basophils Relative: 1 %
Eosinophils Absolute: 0 10*3/uL (ref 0.0–0.5)
Eosinophils Relative: 0 %
HCT: 35.2 % — ABNORMAL LOW (ref 36.0–46.0)
Hemoglobin: 11 g/dL — ABNORMAL LOW (ref 12.0–15.0)
Immature Granulocytes: 2 %
Lymphocytes Relative: 7 %
Lymphs Abs: 0.8 10*3/uL (ref 0.7–4.0)
MCH: 29.5 pg (ref 26.0–34.0)
MCHC: 31.3 g/dL (ref 30.0–36.0)
MCV: 94.4 fL (ref 80.0–100.0)
Monocytes Absolute: 0.8 10*3/uL (ref 0.1–1.0)
Monocytes Relative: 7 %
Neutro Abs: 8.8 10*3/uL — ABNORMAL HIGH (ref 1.7–7.7)
Neutrophils Relative %: 83 %
Platelets: 263 10*3/uL (ref 150–400)
RBC: 3.73 MIL/uL — ABNORMAL LOW (ref 3.87–5.11)
RDW: 17.9 % — ABNORMAL HIGH (ref 11.5–15.5)
WBC: 10.7 10*3/uL — ABNORMAL HIGH (ref 4.0–10.5)
nRBC: 0 % (ref 0.0–0.2)

## 2019-08-05 LAB — COMPREHENSIVE METABOLIC PANEL
ALT: 20 U/L (ref 0–44)
AST: 21 U/L (ref 15–41)
Albumin: 3.9 g/dL (ref 3.5–5.0)
Alkaline Phosphatase: 140 U/L — ABNORMAL HIGH (ref 38–126)
Anion gap: 12 (ref 5–15)
BUN: 18 mg/dL (ref 6–20)
CO2: 25 mmol/L (ref 22–32)
Calcium: 9.1 mg/dL (ref 8.9–10.3)
Chloride: 105 mmol/L (ref 98–111)
Creatinine, Ser: 0.58 mg/dL (ref 0.44–1.00)
GFR calc Af Amer: >60 mL/min (ref >60)
GFR calc non Af Amer: >60 mL/min (ref >60)
Glucose, Bld: 116 mg/dL — ABNORMAL HIGH (ref 70–99)
Potassium: 4 mmol/L (ref 3.5–5.1)
Sodium: 142 mmol/L (ref 135–145)
Total Bilirubin: 0.5 mg/dL (ref 0.3–1.2)
Total Protein: 7 g/dL (ref 6.5–8.1)

## 2019-08-05 LAB — PROTIME-INR
INR: 1 (ref 0.8–1.2)
Prothrombin Time: 12.6 seconds (ref 11.4–15.2)

## 2019-08-05 LAB — LACTIC ACID, PLASMA: Lactic Acid, Venous: 1.2 mmol/L (ref 0.5–1.9)

## 2019-08-05 LAB — TROPONIN I (HIGH SENSITIVITY): Troponin I (High Sensitivity): 9 ng/L (ref <18)

## 2019-08-05 LAB — BRAIN NATRIURETIC PEPTIDE: B Natriuretic Peptide: 208 pg/mL — ABNORMAL HIGH (ref 0.0–100.0)

## 2019-08-05 MED ORDER — PREDNISONE 20 MG PO TABS: 20.0000 mg | ORAL_TABLET | Freq: Every day | ORAL | 2 refills | Status: DC

## 2019-08-05 MED ORDER — IOHEXOL 350 MG/ML SOLN
75.0000 mL | Freq: Once | INTRAVENOUS | Status: AC | PRN
  Administered 2019-08-06: 75 mL via INTRAVENOUS

## 2019-08-05 NOTE — ED Provider Notes (Signed)
-----------------------------------------   11:34 PM on 08/05/2019 -----------------------------------------  Assuming care from Dr. Cherylann Banas.  In short, Ashley Molina is a 59 y.o. female with a chief complaint of hemoptysis.  Refer to the original H&P for additional details.  The current plan of care is to follow up CTA chest.  Anticipate admission.   ----------------------------------------- 1:07 AM on 08/06/2019 -----------------------------------------  The patient CT scan did not show any sign of pulmonary embolism but was notable for worsening multifocal pneumonia.  She was diagnosed with COVID-19 about 2 and half months ago and her appearance currently is that of COVID-19.  I talked with the patient and she said that she received outpatient antibiotic infusions and Decadron for COVID-19 when she was first diagnosed and had only mild symptoms.  She has been gradually getting worse over the last couple of weeks including the hemoptysis that she developed today.  Given the somewhat atypical presentation, I have ordered ceftriaxone 2 g IV, azithromycin 500 mg IV, Decadron 10 mg IV, and remdesivir per pharmacy consult.  I talked with the pharmacist, Nicki Reaper, and explained the situation.  I have also spoken with Dr. Sidney Ace with the hospitalist service who will admit.  I ordered all of the out on "COVID-19 labs" including inflammatory markers, procalcitonin, etc.  Currently she does not meet sepsis criteria and I do not believe this is a sepsis case but I feel that erring on the side of caution with empiric antibiotics is appropriate.   Hinda Kehr, MD 08/06/19 [phone]

## 2019-08-05 NOTE — ED Provider Notes (Signed)
[facility] Emergency Department Provider Note ____________________________________________   First MD Initiated Contact with Patient 08/05/19 2218     (approximate)  I have reviewed the triage vital signs and the nursing notes.   HISTORY  Chief Complaint Hemoptysis    HPI Ashley Molina is a 59 y.o. female with PMH as noted below including breast cancer with metastases to the hilar lymph nodes who presents with hemoptysis, acute onset this evening, and described as a relatively small amount of bright red blood.  In addition, the patient has had gradual onset worsening shortness of breath and feels acutely short of breath right now.  She denies any chest pain or fever.  She has been on 20 mg of prednisone daily recently, and also uses oxygen at home as needed.  Past Medical History:  Diagnosis Date  . Anemia   . Breast cancer ([place])   . Breast cancer, right ([place]) 04/2017   Hx Lumpectomy, Chemo + Rad tx's.  . Breast cyst, right    aspirated by Dr. Bary Castilla  . COVID-19   . Hyperlipidemia   . Personal history of chemotherapy   . Pre-diabetes     Patient Active Problem List   Diagnosis Date Noted  . Pneumonitis 01/20/2019  . Goals of care, counseling/discussion 10/08/2018  . Chemotherapy induced neutropenia ([place]) 05/03/2017  . 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 Malignant neoplasm of upper-outer quadrant of right breast in female, estrogen receptor negative ([place]) 04/05/2017  . Anemia 03/14/2017  . Solitary cyst of breast, right 03/14/2017  . Pre-diabetes 03/14/2017  . Hypercholesterolemia 03/14/2017  . Mass of upper outer quadrant of right breast 03/14/2017    Past Surgical History:  Procedure Laterality Date  . AXILLARY LYMPH NODE BIOPSY Right 04/09/2017   Procedure: AXILLARY LYMPH NODE BIOPSY;  Surgeon: Robert Bellow, MD;  Location: [facility] ORS;  Service: General;  Laterality: Right;  . BREAST BIOPSY Right  03/27/2017   US guided breast mass - invasive mammary carcinoma  . BREAST BIOPSY Right 03/27/2017   Lymph node - metastatic carcinoma  . BREAST BIOPSY Right 04/09/2017   Lymph node   . BREAST CYST ASPIRATION Right 11/2009   Dr. Bary Castilla did FNA  . BREAST EXCISIONAL BIOPSY Right 09/28/2017   lumpectomy and 12 lympnode rad neo adj chemo  . COLONOSCOPY  2011  . DILATION AND CURETTAGE OF UTERUS     X3  . ENDOBRONCHIAL ULTRASOUND N/A 04/09/2017   Procedure: ENDOBRONCHIAL ULTRASOUND;  Surgeon: Laverle Hobby, MD;  Location: [facility] ORS;  Service: Pulmonary;  Laterality: N/A;  . ENDOMETRIAL ABLATION    . ENDOMETRIAL BIOPSY  09/2009  . PORTACATH PLACEMENT Left 04/09/2017   Procedure: INSERTION PORT-A-CATH;  Surgeon: Robert Bellow, MD;  Location: [facility] ORS;  Service: General;  Laterality: Left;    Prior to Admission medications   Medication Sig Start Date End Date Taking? Authorizing Provider  benzonatate (TESSALON PERLES) 100 MG capsule Take 2 capsules (200 mg total) by mouth 3 (three) times daily. Patient taking differently: Take 200 mg by mouth 3 (three) times daily as needed.  01/13/19 01/13/20  Laverle Hobby, MD  Biotin 2500 MCG CAPS Take 5,000 mcg/day by mouth daily.    [provider]  Cholecalciferol (VITAMIN D3) 10000 units TABS Take by mouth 1 day or 1 dose.    [provider]  Ferrous Sulfate (IRON) 325 (65 Fe) MG TABS Take 1 tablet by mouth daily.    [provider]  Fluticasone Furoate (ARNUITY ELLIPTA) 100 MCG/ACT AEPB Inhale 1 puff into the lungs daily. 06/11/19   Tyler Pita, MD  HYDROcodone-homatropine [facility]) 5-1.5 MG/5ML syrup Take 5 mLs by mouth every 6 (six) hours as needed for cough. 01/10/19   Sindy Guadeloupe, MD  Multiple Vitamins-Minerals (CENTRUM SILVER ADULT 50+) TABS Take 1 tablet by mouth daily.    [provider]  Omega-3 Fatty Acids (FISH OIL) 1000 MG CAPS Take 1 capsule by mouth 1 day or 1 dose.    [provider]  pantoprazole (PROTONIX) 20 MG tablet Take 1 tablet (20 mg total) by mouth daily. 06/20/19   Sindy Guadeloupe, MD  predniSONE (DELTASONE) 10 MG tablet 1 tablet daily. 07/16/19   Tyler Pita, MD  predniSONE (DELTASONE) 20 MG tablet Take 1 tablet (20 mg total) by mouth daily. 08/05/19 08/04/20  Tyler Pita, MD  sulfamethoxazole-trimethoprim (BACTRIM DS) 800-160 MG tablet Take 1 tablet by mouth 3 (three) times a week. Patient not taking: Reported on 07/17/2019 06/16/19   Sindy Guadeloupe, MD  sulfamethoxazole-trimethoprim (BACTRIM DS) 800-160 MG tablet 1 tablet three times per week. 07/28/19   Tyler Pita, MD  prochlorperazine (COMPAZINE) 10 MG tablet Take 1 tablet (10 mg total) by mouth every 6 (six) hours as needed (Nausea or vomiting). 04/05/17 09/05/18  Sindy Guadeloupe, MD    Allergies Penicillins  Family History  Problem Relation Age of Onset  . Melanoma Maternal Grandmother 18       currently 35  . Brain cancer Maternal Grandfather 92       unk. type; deceased in 32s  . Melanoma Other 16       mat grandmother's father  . Melanoma Father 62       on head; currently 76  . Prostate cancer Maternal Uncle        3 maternal uncles; dx in 9s  . Breast cancer Other        mat grandfather's sister; dx 5s    Social History Social History   Tobacco Use  . Smoking status: Never Smoker  . Smokeless tobacco: Never Used  Substance Use Topics  . Alcohol use: Not Currently  . Drug use: No    Review of Systems  Constitutional: No fever. Eyes: No redness. ENT: No sore throat. Cardiovascular: Denies chest pain. Respiratory: Positive for shortness of breath. Gastrointestinal: No vomiting or diarrhea.  Genitourinary: Negative for flank pain.  Musculoskeletal: Negative for back pain. Skin: Negative for rash. Neurological: Negative for headache.   ____________________________________________   PHYSICAL EXAM:  VITAL SIGNS: ED Triage Vitals  Enc Vitals Group      BP 08/05/19 2206 (!) 169/97     Pulse Rate 08/05/19 2206 (!) 134     Resp 08/05/19 2206 (!) 22     Temp 08/05/19 2206 98.3 F (36.8 C)     Temp Source 08/05/19 2206 Oral     SpO2 08/05/19 2206 96 %     Weight 08/05/19 2207 200 lb (90.7 kg)     Height 08/05/19 2207 5\' 2"  (1.575 m)     Head Circumference --      Peak Flow --      Pain Score 08/05/19 2207 0     Pain Loc --      Pain Edu? --      Excl. in [place]? --     Constitutional: Alert and oriented.  Uncomfortable appearing but in no acute distress. Eyes: Conjunctivae  are normal.  Head: Atraumatic. Nose: No congestion/rhinnorhea. Mouth/Throat: Mucous membranes are moist.   Neck: Normal range of motion.  Cardiovascular: Tachycardic, regular rhythm. Grossly normal heart sounds.  Good peripheral circulation. Respiratory: Increased respiratory effort.  No retractions. Lungs CTAB. Gastrointestinal: No distention.  Musculoskeletal: 2+ bilateral lower extremity edema.  Extremities warm and well perfused.  Neurologic:  Normal speech and language. No gross focal neurologic deficits are appreciated.  Skin:  Skin is warm and dry. No rash noted. Psychiatric: Mood and affect are normal. Speech and behavior are normal.  ____________________________________________   LABS (all labs ordered are listed, but only abnormal results are displayed)  Labs Reviewed  COMPREHENSIVE METABOLIC PANEL - Abnormal; Notable for the following components:      Result Value   Glucose, Bld 116 (*)    Alkaline Phosphatase 140 (*)    All other components within normal limits  CBC WITH DIFFERENTIAL/PLATELET - Abnormal; Notable for the following components:   WBC 10.7 (*)    RBC 3.73 (*)    Hemoglobin 11.0 (*)    HCT 35.2 (*)    RDW 17.9 (*)    Neutro Abs 8.8 (*)    Abs Immature Granulocytes 0.23 (*)    All other components within normal limits  BRAIN NATRIURETIC PEPTIDE - Abnormal; Notable for the following components:   B Natriuretic Peptide 208.0  (*)    All other components within normal limits  CULTURE, BLOOD (ROUTINE X 2)  CULTURE, BLOOD (ROUTINE X 2)  LACTIC ACID, PLASMA  PROTIME-INR  LACTIC ACID, PLASMA  PROCALCITONIN  TROPONIN I (HIGH SENSITIVITY)  TROPONIN I (HIGH SENSITIVITY)   ____________________________________________  EKG   ____________________________________________  RADIOLOGY  CXR: Postradiation fibrosis with no acute focal infiltrate or edema  ____________________________________________   PROCEDURES  Procedure(s) performed: No  Procedures  Critical Care performed: No ____________________________________________   INITIAL IMPRESSION / ASSESSMENT AND PLAN / ED COURSE  Pertinent labs & imaging results that were available during my care of the patient were reviewed by me and considered in my medical decision making (see chart for details).  59 year old female with PMH as noted above including breast cancer with metastases presents with 1 episode of hemoptysis acute onset this evening but also with worsening shortness of breath despite being on prednisone and using oxygen at home as needed.  She has not coughed up any further blood.  I reviewed the past medical records in [place].  The patient has had no recent ED visits or admissions.  She is followed by pulmonary here and has been on prednisone 5 mg chronically but recently had to increase it.  She uses 2 L of oxygen intermittently as needed.  On initial exam, the patient was uncomfortable appearing, short of breath, and tachycardic.  O2 saturation was in the mid 80s on room air although went up to 100% on nasal cannula.  The remainder of the exam was as described above.  Differential includes acute bronchitis, pneumonia, other infectious etiology, hemoptysis related to the cancer itself, or possible pulmonary embolism.  The patient had Covid just under 3 months ago, so repeat testing at this time will not be of any clinical utility.  However, she has  no fever or other symptoms to suggest an active COVID-19 infection.  We will obtain a chest x-ray, lab work-up, and reassess.  I anticipate that the patient will need a CT angiogram to rule out PE.  ----------------------------------------- 12:00 AM on 08/06/2019 -----------------------------------------  Chest x-ray shows post radiation type  changes but no acute abnormality to explain the hemoptysis.  I have ordered a CT angiogram.  The patient is feeling much better now on 2 L of oxygen.  Given the degree of her shortness of breath and hypoxia earlier, I think she would benefit from monitoring in the hospital overnight even if she does not have a PE.  I signed the patient out to the oncoming physician Dr. Karma Greaser.  ____________________________________________   FINAL CLINICAL IMPRESSION(S) / ED DIAGNOSES  Final diagnoses:  Hemoptysis      NEW MEDICATIONS STARTED DURING THIS VISIT:  New Prescriptions   No medications on file     Note:  This document was prepared using Dragon voice recognition software and may include unintentional dictation errors.   Arta Silence, MD 08/06/19 [phone]

## 2019-08-05 NOTE — Telephone Encounter (Signed)
Dr. Patsey Berthold please see below message and advise.

## 2019-08-05 NOTE — ED Triage Notes (Signed)
Pt reports coughing up  Bright red blood tonight.  jpt has a picture on her phone.  Pt also reports sob.  Pt on 2 liters oxygen prn.  Hx covid, breast cancer. Pt reports breast cancer has spread to her lungs and pt had chemo 2 weeks ago.  Pt has a port.  Pt alert.

## 2019-08-05 NOTE — Telephone Encounter (Signed)
Ok to give Rx. For 20 mg tabs #30 2R

## 2019-08-06 ENCOUNTER — Other Ambulatory Visit: Payer: Self-pay

## 2019-08-06 ENCOUNTER — Encounter: Payer: Self-pay | Admitting: Family Medicine

## 2019-08-06 ENCOUNTER — Encounter: Payer: Self-pay | Admitting: Oncology

## 2019-08-06 DIAGNOSIS — R0689 Other abnormalities of breathing: Secondary | ICD-10-CM | POA: Diagnosis not present

## 2019-08-06 DIAGNOSIS — Z923 Personal history of irradiation: Secondary | ICD-10-CM | POA: Diagnosis not present

## 2019-08-06 DIAGNOSIS — R918 Other nonspecific abnormal finding of lung field: Secondary | ICD-10-CM

## 2019-08-06 DIAGNOSIS — E8770 Fluid overload, unspecified: Secondary | ICD-10-CM | POA: Diagnosis present

## 2019-08-06 DIAGNOSIS — Z79899 Other long term (current) drug therapy: Secondary | ICD-10-CM | POA: Diagnosis not present

## 2019-08-06 DIAGNOSIS — J1282 Pneumonia due to coronavirus disease 2019: Secondary | ICD-10-CM

## 2019-08-06 DIAGNOSIS — E785 Hyperlipidemia, unspecified: Secondary | ICD-10-CM | POA: Diagnosis present

## 2019-08-06 DIAGNOSIS — U071 COVID-19: Principal | ICD-10-CM

## 2019-08-06 DIAGNOSIS — Z9221 Personal history of antineoplastic chemotherapy: Secondary | ICD-10-CM | POA: Diagnosis not present

## 2019-08-06 DIAGNOSIS — C7800 Secondary malignant neoplasm of unspecified lung: Secondary | ICD-10-CM | POA: Diagnosis present

## 2019-08-06 DIAGNOSIS — Z880 Allergy status to penicillin: Secondary | ICD-10-CM | POA: Diagnosis not present

## 2019-08-06 DIAGNOSIS — J189 Pneumonia, unspecified organism: Secondary | ICD-10-CM | POA: Diagnosis present

## 2019-08-06 DIAGNOSIS — C50911 Malignant neoplasm of unspecified site of right female breast: Secondary | ICD-10-CM | POA: Diagnosis present

## 2019-08-06 DIAGNOSIS — R0600 Dyspnea, unspecified: Secondary | ICD-10-CM | POA: Diagnosis not present

## 2019-08-06 DIAGNOSIS — Z171 Estrogen receptor negative status [ER-]: Secondary | ICD-10-CM | POA: Diagnosis not present

## 2019-08-06 DIAGNOSIS — Z8616 Personal history of COVID-19: Secondary | ICD-10-CM | POA: Diagnosis not present

## 2019-08-06 DIAGNOSIS — Z79891 Long term (current) use of opiate analgesic: Secondary | ICD-10-CM | POA: Diagnosis not present

## 2019-08-06 DIAGNOSIS — J9601 Acute respiratory failure with hypoxia: Secondary | ICD-10-CM

## 2019-08-06 DIAGNOSIS — R7303 Prediabetes: Secondary | ICD-10-CM | POA: Diagnosis present

## 2019-08-06 DIAGNOSIS — R042 Hemoptysis: Secondary | ICD-10-CM | POA: Diagnosis present

## 2019-08-06 DIAGNOSIS — J9621 Acute and chronic respiratory failure with hypoxia: Secondary | ICD-10-CM | POA: Diagnosis present

## 2019-08-06 DIAGNOSIS — Z803 Family history of malignant neoplasm of breast: Secondary | ICD-10-CM | POA: Diagnosis not present

## 2019-08-06 DIAGNOSIS — Z7952 Long term (current) use of systemic steroids: Secondary | ICD-10-CM | POA: Diagnosis not present

## 2019-08-06 LAB — LACTIC ACID, PLASMA: Lactic Acid, Venous: 1.1 mmol/L (ref 0.5–1.9)

## 2019-08-06 LAB — C-REACTIVE PROTEIN: CRP: 0.6 mg/dL (ref <1.0)

## 2019-08-06 LAB — LIPID PANEL
Cholesterol: 195 mg/dL (ref 0–200)
HDL: 54 mg/dL (ref >40)
LDL Cholesterol: 131 mg/dL — ABNORMAL HIGH (ref 0–99)
Total CHOL/HDL Ratio: 3.6 RATIO
Triglycerides: 51 mg/dL (ref <150)
VLDL: 10 mg/dL (ref 0–40)

## 2019-08-06 LAB — LACTATE DEHYDROGENASE: LDH: 297 U/L — ABNORMAL HIGH (ref 98–192)

## 2019-08-06 LAB — FERRITIN: Ferritin: 216 ng/mL (ref 11–307)

## 2019-08-06 LAB — HEMOGLOBIN A1C
Hgb A1c MFr Bld: 5.4 % (ref 4.8–5.6)
Mean Plasma Glucose: 108.28 mg/dL

## 2019-08-06 LAB — ABO/RH: ABO/RH(D): O POS

## 2019-08-06 LAB — PROCALCITONIN: Procalcitonin: <0.1 ng/mL

## 2019-08-06 LAB — TROPONIN I (HIGH SENSITIVITY): Troponin I (High Sensitivity): 13 ng/L (ref <18)

## 2019-08-06 LAB — HIV ANTIBODY (ROUTINE TESTING W REFLEX): HIV Screen 4th Generation wRfx: NONREACTIVE

## 2019-08-06 LAB — FIBRIN DERIVATIVES D-DIMER (ARMC ONLY): Fibrin derivatives D-dimer (ARMC): 2193.2 ng/mL (FEU) — ABNORMAL HIGH (ref 0.00–499.00)

## 2019-08-06 LAB — FIBRINOGEN: Fibrinogen: 375 mg/dL (ref 210–475)

## 2019-08-06 LAB — TRIGLYCERIDES: Triglycerides: 84 mg/dL (ref <150)

## 2019-08-06 MED ORDER — DEXAMETHASONE SODIUM PHOSPHATE 10 MG/ML IJ SOLN: 6.0000 mg | INTRAMUSCULAR | Status: DC

## 2019-08-06 MED ORDER — FUROSEMIDE 10 MG/ML IJ SOLN
20.0000 mg | Freq: Once | INTRAMUSCULAR | Status: AC
  Administered 2019-08-06: 13:00:00 20 mg via INTRAVENOUS
  Filled 2019-08-06: qty 2

## 2019-08-06 MED ORDER — BIOTIN 2500 MCG PO CAPS: 5000.0000 ug/d | ORAL_CAPSULE | Freq: Every day | ORAL | Status: DC

## 2019-08-06 MED ORDER — TRAZODONE HCL 50 MG PO TABS: 25.0000 mg | ORAL_TABLET | Freq: Every evening | ORAL | Status: DC | PRN

## 2019-08-06 MED ORDER — SODIUM CHLORIDE 0.9 % IV SOLN: 200.0000 mg | Freq: Once | INTRAVENOUS | Status: DC

## 2019-08-06 MED ORDER — SODIUM CHLORIDE 0.9 % IV SOLN: 100.0000 mg | Freq: Every day | INTRAVENOUS | Status: DC

## 2019-08-06 MED ORDER — OMEGA-3-ACID ETHYL ESTERS 1 G PO CAPS: 1.0000 g | ORAL_CAPSULE | Freq: Every day | ORAL | Status: DC

## 2019-08-06 MED ORDER — DEXAMETHASONE SODIUM PHOSPHATE 10 MG/ML IJ SOLN
10.0000 mg | Freq: Once | INTRAMUSCULAR | Status: AC
  Administered 2019-08-06: 01:00:00 10 mg via INTRAVENOUS
  Filled 2019-08-06: qty 1

## 2019-08-06 MED ORDER — ACETAMINOPHEN 325 MG PO TABS: 650.0000 mg | ORAL_TABLET | Freq: Four times a day (QID) | ORAL | Status: DC | PRN

## 2019-08-06 MED ORDER — PREDNISONE 20 MG PO TABS
20.0000 mg | ORAL_TABLET | Freq: Every day | ORAL | Status: DC
  Administered 2019-08-06 – 2019-08-07 (×2): 20 mg via ORAL
  Filled 2019-08-06 (×2): qty 1

## 2019-08-06 MED ORDER — GUAIFENESIN-DM 100-10 MG/5ML PO SYRP: 10.0000 mL | ORAL_SOLUTION | ORAL | Status: DC | PRN

## 2019-08-06 MED ORDER — FAMOTIDINE 20 MG PO TABS
20.0000 mg | ORAL_TABLET | Freq: Two times a day (BID) | ORAL | Status: DC
  Administered 2019-08-06 – 2019-08-07 (×3): 20 mg via ORAL
  Filled 2019-08-06 (×3): qty 1

## 2019-08-06 MED ORDER — ONDANSETRON HCL 4 MG PO TABS: 4.0000 mg | ORAL_TABLET | Freq: Four times a day (QID) | ORAL | Status: DC | PRN

## 2019-08-06 MED ORDER — BENZONATATE 100 MG PO CAPS: 200.0000 mg | ORAL_CAPSULE | Freq: Three times a day (TID) | ORAL | Status: DC | PRN

## 2019-08-06 MED ORDER — VITAMIN D 25 MCG (1000 UNIT) PO TABS: 1000.0000 [IU] | ORAL_TABLET | Freq: Every day | ORAL | Status: DC

## 2019-08-06 MED ORDER — ENOXAPARIN SODIUM 40 MG/0.4ML ~~LOC~~ SOLN
40.0000 mg | SUBCUTANEOUS | Status: DC
  Administered 2019-08-06 – 2019-08-07 (×2): 40 mg via SUBCUTANEOUS
  Filled 2019-08-06 (×2): qty 0.4

## 2019-08-06 MED ORDER — SODIUM CHLORIDE 0.9 % IV SOLN
500.0000 mg | INTRAVENOUS | Status: DC
  Administered 2019-08-06: 500 mg via INTRAVENOUS
  Filled 2019-08-06 (×2): qty 500

## 2019-08-06 MED ORDER — ADULT MULTIVITAMIN W/MINERALS CH: 1.0000 | ORAL_TABLET | Freq: Every day | ORAL | Status: DC

## 2019-08-06 MED ORDER — ONDANSETRON HCL 4 MG/2ML IJ SOLN: 4.0000 mg | Freq: Four times a day (QID) | INTRAMUSCULAR | Status: DC | PRN

## 2019-08-06 MED ORDER — SODIUM CHLORIDE 0.9 % IV BOLUS
500.0000 mL | Freq: Once | INTRAVENOUS | Status: AC
  Administered 2019-08-06: 01:00:00 500 mL via INTRAVENOUS

## 2019-08-06 MED ORDER — SODIUM CHLORIDE 0.9 % IV SOLN
200.0000 mg | Freq: Once | INTRAVENOUS | Status: AC
  Administered 2019-08-06: 03:00:00 200 mg via INTRAVENOUS
  Filled 2019-08-06: qty 40

## 2019-08-06 MED ORDER — ASPIRIN EC 81 MG PO TBEC
81.0000 mg | DELAYED_RELEASE_TABLET | Freq: Every day | ORAL | Status: DC
  Administered 2019-08-06 – 2019-08-07 (×2): 81 mg via ORAL
  Filled 2019-08-06 (×2): qty 1

## 2019-08-06 MED ORDER — HYDROCOD POLST-CPM POLST ER 10-8 MG/5ML PO SUER: 5.0000 mL | Freq: Two times a day (BID) | ORAL | Status: DC | PRN

## 2019-08-06 MED ORDER — BUDESONIDE 180 MCG/ACT IN AEPB
1.0000 | INHALATION_SPRAY | Freq: Two times a day (BID) | RESPIRATORY_TRACT | Status: DC
  Administered 2019-08-06 – 2019-08-07 (×2): 1 via RESPIRATORY_TRACT
  Filled 2019-08-06: qty 1

## 2019-08-06 MED ORDER — FERROUS SULFATE 325 (65 FE) MG PO TABS
325.0000 mg | ORAL_TABLET | Freq: Every day | ORAL | Status: DC
  Administered 2019-08-06: 21:00:00 325 mg via ORAL
  Filled 2019-08-06: qty 1

## 2019-08-06 MED ORDER — SODIUM CHLORIDE 0.9 % IV SOLN
2.0000 g | INTRAVENOUS | Status: DC
  Administered 2019-08-06: 01:00:00 2 g via INTRAVENOUS
  Filled 2019-08-06 (×2): qty 20

## 2019-08-06 MED ORDER — SODIUM CHLORIDE 0.9 % IV SOLN: INTRAVENOUS | Status: DC

## 2019-08-06 MED ORDER — SULFAMETHOXAZOLE-TRIMETHOPRIM 800-160 MG PO TABS
1.0000 | ORAL_TABLET | ORAL | Status: DC
  Administered 2019-08-06: 1 via ORAL
  Filled 2019-08-06 (×2): qty 1

## 2019-08-06 MED ORDER — HYDROCODONE-HOMATROPINE 5-1.5 MG/5ML PO SYRP: 5.0000 mL | ORAL_SOLUTION | Freq: Four times a day (QID) | ORAL | Status: DC | PRN

## 2019-08-06 MED ORDER — BUDESONIDE 0.25 MG/2ML IN SUSP: 0.2500 mg | Freq: Two times a day (BID) | RESPIRATORY_TRACT | Status: DC

## 2019-08-06 MED ORDER — MAGNESIUM HYDROXIDE 400 MG/5ML PO SUSP: 30.0000 mL | Freq: Every day | ORAL | Status: DC | PRN

## 2019-08-06 NOTE — Consult Note (Signed)
Reason for Consult: Dyspnea, hypoxia, previous COVID-19 Referring Physician: Enzo Bi, MD  Ashley Molina is an 59 y.o. female.  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  The patient had COVID-19 in late December 2020.  She presented with acute onset of worsening dyspnea over the last 1-1/2-2 weeks associated with dry cough.  She has been battling issues with an inflammatory process, steroid responsive of uncertain etiology, that has been ill characterized.  Of note she noted increasing dyspnea a few days after receiving her last gemcitabine infusion.  She also experienced generalized body aches and chest discomfort and tightness.  In addition she experience lower extremity edema which she states is her usual after gemcitabine but this time the edema did not resolve after a few days.  Prior to that she had been weaned down to 10 mg of prednisone but after that did not sit up in dose she had to increase to 20 mg again.  She did this on her own 2 days ago.  This failed to improve her symptoms.  She has not had any fevers, chills or sweats.   On admission she had a CT scan of the chest that showed increase in multifocal patchy groundglass opacities and some worsening tree-in-bud nodular opacities in the left upper lobe (these represent inflammation).  She has posttreatment radiation and fibrotic changes on the right infrahilar region however I see no increase in adenopathy and no change in this right hilar process.  She has a trace right pleural effusion.  I have reviewed these films independently and have compared to prior.  The patient voices no other complaint.  She has not had any paroxysmal nocturnal dyspnea nor orthopnea.  Of note she received a single dose of Lasix 20 mg IV and felt markedly better after that.   Past Medical History:  Diagnosis Date  . Anemia   . Breast cancer ([place])   . Breast cancer, right ([place]) 04/2017   Hx  Lumpectomy, Chemo + Rad tx's.  . Breast cyst, right    aspirated by Dr. Bary Castilla  . COVID-19   . Hyperlipidemia   . Personal history of chemotherapy   . Pre-diabetes     Past Surgical History:  Procedure Laterality Date  . AXILLARY LYMPH NODE BIOPSY Right 04/09/2017   Procedure: AXILLARY LYMPH NODE BIOPSY;  Surgeon: Robert Bellow, MD;  Location: [facility] ORS;  Service: General;  Laterality: Right;  . BREAST BIOPSY Right 03/27/2017   US guided breast mass - invasive mammary carcinoma  . BREAST BIOPSY Right 03/27/2017   Lymph node - metastatic carcinoma  . BREAST BIOPSY Right 04/09/2017   Lymph node   . BREAST CYST ASPIRATION Right 11/2009   Dr. Bary Castilla did FNA  . BREAST EXCISIONAL BIOPSY Right 09/28/2017   lumpectomy and 12 lympnode rad neo adj chemo  . COLONOSCOPY  2011  . DILATION AND CURETTAGE OF UTERUS     X3  . ENDOBRONCHIAL ULTRASOUND N/A 04/09/2017   Procedure: ENDOBRONCHIAL ULTRASOUND;  Surgeon: Laverle Hobby, MD;  Location: [facility] ORS;  Service: Pulmonary;  Laterality: N/A;  . ENDOMETRIAL ABLATION    . ENDOMETRIAL BIOPSY  09/2009  . PORTACATH PLACEMENT Left 04/09/2017   Procedure: INSERTION PORT-A-CATH;  Surgeon: Robert Bellow, MD;  Location: [facility] ORS;  Service: General;  Laterality: Left;    Family History  Problem Relation Age of Onset  . Melanoma Maternal Grandmother 69       currently  29  . Brain cancer Maternal Grandfather 50       unk. type; deceased in 6s  . Melanoma Other 38       mat grandmother's father  . Melanoma Father 81       on head; currently 63  . Prostate cancer Maternal Uncle        3 maternal uncles; dx in 22s  . Breast cancer Other        mat grandfather's sister; dx 76s    Social History:  reports that she has never smoked. She has never used smokeless tobacco. She reports previous alcohol use. She reports that she does not use drugs.  Allergies:  Allergies  Allergen Reactions  . Penicillins Rash    Medications:  Prior  to Admission: In addition to the below, the patient is on gemcitabine. Medications Prior to Admission  Medication Sig Dispense Refill Last Dose  . benzonatate (TESSALON PERLES) 100 MG capsule Take 2 capsules (200 mg total) by mouth 3 (three) times daily. (Patient taking differently: Take 200 mg by mouth 3 (three) times daily as needed. ) 90 capsule 2 prn at prn  . Biotin 2500 MCG CAPS Take 5,000 mcg/day by mouth at bedtime.    08/04/2019 at 2100  . Cholecalciferol (VITAMIN D3) 10000 units TABS Take 1 tablet by mouth at bedtime.    08/04/2019 at 2100  . Ferrous Sulfate (IRON) 325 (65 Fe) MG TABS Take 1 tablet by mouth at bedtime.    08/04/2019 at 2100  . Fluticasone Furoate (ARNUITY ELLIPTA) 100 MCG/ACT AEPB Inhale 1 puff into the lungs daily. 30 each 3 08/05/2019 at 0800  . HYDROcodone-homatropine (HYCODAN) 5-1.5 MG/5ML syrup Take 5 mLs by mouth every 6 (six) hours as needed for cough. 240 mL 0 prn at prn  . Multiple Vitamins-Minerals (CENTRUM SILVER ADULT 50+) TABS Take 1 tablet by mouth at bedtime.    08/04/2019 at 2100  . Omega-3 Fatty Acids (FISH OIL) 1000 MG CAPS Take 1 capsule by mouth at bedtime.    08/04/2019 at 2100  . pantoprazole (PROTONIX) 20 MG tablet Take 1 tablet (20 mg total) by mouth daily. 90 tablet 1 08/05/2019 at 0800  . predniSONE (DELTASONE) 20 MG tablet Take 1 tablet (20 mg total) by mouth daily. 30 tablet 2 08/05/2019 at 0800  . sulfamethoxazole-trimethoprim (BACTRIM DS) 800-160 MG tablet 1 tablet three times per week. 12 tablet 0 08/04/2019 at 0800  . predniSONE (DELTASONE) 10 MG tablet 1 tablet daily. (Patient not taking: Reported on 08/06/2019) 30 tablet 3 Not Taking at 0800  . sulfamethoxazole-trimethoprim (BACTRIM DS) 800-160 MG tablet Take 1 tablet by mouth 3 (three) times a week. (Patient not taking: Reported on 07/17/2019) 12 tablet 0     Results for orders placed or performed during the hospital encounter of 08/05/19 (from the past 48 hour(s))  Comprehensive metabolic panel     Status:  Abnormal   Collection Time: 08/05/19 10:30 PM  Result Value Ref Range   Sodium 142 135 - 145 mmol/L   Potassium 4.0 3.5 - 5.1 mmol/L   Chloride 105 98 - 111 mmol/L   CO2 25 22 - 32 mmol/L   Glucose, Bld 116 (H) 70 - 99 mg/dL    Comment: Glucose reference range applies only to samples taken after fasting for at least 8 hours.   BUN 18 6 - 20 mg/dL   Creatinine, Ser 0.58 0.44 - 1.00 mg/dL   Calcium 9.1 8.9 - 10.3 mg/dL   Total  Protein 7.0 6.5 - 8.1 g/dL   Albumin 3.9 3.5 - 5.0 g/dL   AST 21 15 - 41 U/L   ALT 20 0 - 44 U/L   Alkaline Phosphatase 140 (H) 38 - 126 U/L   Total Bilirubin 0.5 0.3 - 1.2 mg/dL   GFR calc non Af Amer >60 >60 mL/min   GFR calc Af Amer >60 >60 mL/min   Anion gap 12 5 - 15    Comment: Performed at [facility], [address] [place], [place] [address]  Lactic acid, plasma     Status: None   Collection Time: 08/05/19 10:30 PM  Result Value Ref Range   Lactic Acid, Venous 1.2 0.5 - 1.9 mmol/L    Comment: Performed at [facility], [address] [place], [place] [address]  CBC with Differential     Status: Abnormal   Collection Time: 08/05/19 10:30 PM  Result Value Ref Range   WBC 10.7 (H) 4.0 - 10.5 K/uL   RBC 3.73 (L) 3.87 - 5.11 MIL/uL   Hemoglobin 11.0 (L) 12.0 - 15.0 g/dL   HCT 35.2 (L) 36.0 - 46.0 %   MCV 94.4 80.0 - 100.0 fL   MCH 29.5 26.0 - 34.0 pg   MCHC 31.3 30.0 - 36.0 g/dL   RDW 17.9 (H) 11.5 - 15.5 %   Platelets 263 150 - 400 K/uL   nRBC 0.0 0.0 - 0.2 %   Neutrophils Relative % 83 %   Neutro Abs 8.8 (H) 1.7 - 7.7 K/uL   Lymphocytes Relative 7 %   Lymphs Abs 0.8 0.7 - 4.0 K/uL   Monocytes Relative 7 %   Monocytes Absolute 0.8 0.1 - 1.0 K/uL   Eosinophils Relative 0 %   Eosinophils Absolute 0.0 0.0 - 0.5 K/uL   Basophils Relative 1 %   Basophils Absolute 0.1 0.0 - 0.1 K/uL   Immature Granulocytes 2 %   Abs Immature Granulocytes 0.23 (H) 0.00 - 0.07 K/uL    Comment: Performed at [facility], [address] [place], [place] [address]  Troponin I (High Sensitivity)     Status: None   Collection Time: 08/05/19 10:30 PM  Result Value Ref Range   Troponin I (High Sensitivity) 9 <18 ng/L    Comment: (NOTE) Elevated high sensitivity troponin I (hsTnI) values and significant  changes across serial measurements may suggest ACS but many other  chronic and acute conditions are known to elevate hsTnI results.  Refer to the "Links" section for chest pain algorithms and additional  guidance. Performed at [facility], [address] [place], [place] [address]   Brain natriuretic peptide     Status: Abnormal   Collection Time: 08/05/19 10:30 PM  Result Value Ref Range   B Natriuretic Peptide 208.0 (H) 0.0 - 100.0 pg/mL    Comment: Performed at [facility], [address] [place], [place] [address]  Protime-INR     Status: None   Collection Time: 08/05/19 10:30 PM  Result Value Ref Range   Prothrombin Time 12.6 11.4 - 15.2 seconds   INR 1.0 0.8 - 1.2    Comment: (NOTE) INR goal varies based on device and disease states. Performed at [facility], [address] [place], [place] [address]   Fibrin derivatives D-Dimer     Status: Abnormal   Collection Time: 08/05/19 10:30 PM  Result Value Ref Range   Fibrin derivatives D-dimer ([facility]) 2,193.20 (H) 0.00 - 499.00 ng/mL (FEU)  Comment: (NOTE) <> Exclusion of Venous Thromboembolism (VTE) - OUTPATIENT ONLY   (Emergency Department or [place])   0-499 ng/ml (FEU): With a low to intermediate pretest probability                      for VTE this test result excludes the diagnosis                      of VTE.   >499 ng/ml (FEU) : VTE not excluded; additional work up for VTE is                      required. <> Testing on Inpatients and Evaluation of Disseminated Intravascular   Coagulation (DIC) Reference Range:   0-499 ng/ml (FEU) Performed at [facility], [address] [place], [place] [address]    Lactate dehydrogenase     Status: Abnormal   Collection Time: 08/05/19 10:30 PM  Result Value Ref Range   LDH 297 (H) 98 - 192 U/L    Comment: Performed at [facility], [address] [place], [place] [address]  Ferritin     Status: None   Collection Time: 08/05/19 10:30 PM  Result Value Ref Range   Ferritin 216 11 - 307 ng/mL    Comment: Performed at [facility], [address] [place], [place] [address]  Fibrinogen     Status: None   Collection Time: 08/05/19 10:30 PM  Result Value Ref Range   Fibrinogen 375 210 - 475 mg/dL    Comment: Performed at [facility], [address] [place], [place] [address]  C-reactive protein     Status: None   Collection Time: 08/05/19 10:30 PM  Result Value Ref Range   CRP 0.6 <1.0 mg/dL    Comment: Performed at [facility], [place] [address] [place], [place] [address]  ABO/Rh     Status: None   Collection Time: 08/05/19 10:30 PM  Result Value Ref Range   ABO/RH(D)      O POS Performed at [facility], [place], [place] [address]   Lactic acid, plasma     Status: None   Collection Time: 08/06/19 12:38 AM  Result Value Ref Range   Lactic Acid, Venous 1.1 0.5 - 1.9 mmol/L    Comment: Performed at [facility], [place], [place] [address]  Troponin I (High Sensitivity)     Status: None   Collection Time: 08/06/19 12:38 AM  Result Value Ref Range   Troponin I (High Sensitivity) 13 <18 ng/L    Comment: (NOTE) Elevated high sensitivity troponin I (hsTnI) values and significant  changes across serial measurements may suggest ACS but many other  chronic and acute conditions are known to elevate hsTnI results.  Refer to the "Links" section for chest pain algorithms and additional  guidance. Performed at [facility], [address] [place],  [address]   Procalcitonin     Status: None   Collection Time: 08/06/19 12:38 AM  Result Value  Ref Range   Procalcitonin <0.10 ng/mL    Comment:        Interpretation: PCT (Procalcitonin) <= 0.5 ng/mL: Systemic infection (sepsis) is not likely. Local bacterial infection is possible. (NOTE)       Sepsis PCT Algorithm           Lower Respiratory Tract  Infection PCT Algorithm    ----------------------------     ----------------------------         PCT < 0.25 ng/mL                PCT < 0.10 ng/mL         Strongly encourage             Strongly discourage   discontinuation of antibiotics    initiation of antibiotics    ----------------------------     -----------------------------       PCT 0.25 - 0.50 ng/mL            PCT 0.10 - 0.25 ng/mL               OR       >80% decrease in PCT            Discourage initiation of                                            antibiotics      Encourage discontinuation           of antibiotics    ----------------------------     -----------------------------         PCT >= 0.50 ng/mL              PCT 0.26 - 0.50 ng/mL               AND        <80% decrease in PCT             Encourage initiation of                                             antibiotics       Encourage continuation           of antibiotics    ----------------------------     -----------------------------        PCT >= 0.50 ng/mL                  PCT > 0.50 ng/mL               AND         increase in PCT                  Strongly encourage                                      initiation of antibiotics    Strongly encourage escalation           of antibiotics                                     -----------------------------                                           PCT <= 0.25 ng/mL  OR                                        > 80% decrease in PCT                                     Discontinue / Do not initiate                                             antibiotics Performed at [facility], [address] [place], [place] [address]   Triglycerides     Status: None   Collection Time: 08/06/19 12:38 AM  Result Value Ref Range   Triglycerides 84 <150 mg/dL    Comment: Performed at [facility], [address] [place], [place] [address]  Blood Culture (routine x 2)     Status: None (Preliminary result)   Collection Time: 08/06/19  1:00 AM   Specimen: BLOOD  Result Value Ref Range   Specimen Description BLOOD RIGHT HAND    Special Requests      BOTTLES DRAWN AEROBIC AND ANAEROBIC Blood Culture results may not be optimal due to an inadequate volume of blood received in culture bottles   Culture      NO GROWTH < 12 HOURS Performed at [facility], [address], [place], [place] [address]    Report Status PENDING   Blood Culture (routine x 2)     Status: None (Preliminary result)   Collection Time: 08/06/19  1:01 AM   Specimen: BLOOD  Result Value Ref Range   Specimen Description BLOOD RIGHT ARM    Special Requests      BOTTLES DRAWN AEROBIC AND ANAEROBIC Blood Culture results may not be optimal due to an inadequate volume of blood received in culture bottles   Culture      NO GROWTH < 12 HOURS Performed at [facility], [address] [place], [place] [address]    Report Status PENDING   HIV Antibody (routine testing w rflx)     Status: None   Collection Time: 08/06/19  5:48 AM  Result Value Ref Range   HIV Screen 4th Generation wRfx NON REACTIVE NON REACTIVE    Comment: Performed at [facility], [place] [address], [place], [place] [address]  Lipid panel     Status: Abnormal   Collection Time: 08/06/19  5:48 AM  Result Value Ref Range   Cholesterol 195 0 - 200 mg/dL   Triglycerides 51 <150 mg/dL   HDL 54 >40 mg/dL   Total CHOL/HDL Ratio 3.6 RATIO   VLDL 10 0 - 40 mg/dL   LDL Cholesterol 131 (H) 0 - 99 mg/dL    Comment:        Total Cholesterol/HDL:CHD Risk Coronary Heart Disease Risk Table                      Men   Women  1/2 Average Risk   3.4   3.3  Average Risk       5.0   4.4  2 X Average Risk   9.6   7.1  3 X Average Risk  23.4  11.0        Use the calculated Patient Ratio above and the CHD Risk Table to determine the patient's CHD Risk.        ATP III CLASSIFICATION (LDL):  <100     mg/dL   Optimal  100-129  mg/dL   Near or Above                    Optimal  130-159  mg/dL   Borderline  160-189  mg/dL   High  >190     mg/dL   Very High Performed at [facility], [address] [place], [place] [address]   Hemoglobin A1c     Status: None   Collection Time: 08/06/19  5:48 AM  Result Value Ref Range   Hgb A1c MFr Bld 5.4 4.8 - 5.6 %    Comment: (NOTE) Pre diabetes:          5.7%-6.4% Diabetes:              >6.4% Glycemic control for   <7.0% adults with diabetes    Mean Plasma Glucose 108.28 mg/dL    Comment: Performed at [facility] [address] [place], [place] [address]    CT Angio Chest PE W and/or Wo Contrast  Result Date: 08/06/2019 CLINICAL DATA:  Hemoptysis, history of breast cancer and COVID EXAM: CT ANGIOGRAPHY CHEST WITH CONTRAST TECHNIQUE: Multidetector CT imaging of the chest was performed using the standard protocol during bolus administration of intravenous contrast. Multiplanar CT image reconstructions and MIPs were obtained to evaluate the vascular anatomy. CONTRAST:  56mL OMNIPAQUE IOHEXOL 350 MG/ML SOLN COMPARISON:  July 09, 2019 FINDINGS: Cardiovascular: There is a optimal opacification of the pulmonary arteries. There is no central,segmental, or subsegmental filling defects within the pulmonary arteries. A left-sided MediPort catheter is seen with the tip in the right atrium. The heart is normal in size. No pericardial effusion or thickening. No evidence right heart strain. There is normal three-vessel brachiocephalic anatomy without proximal stenosis. Scattered mild aortic atherosclerosis. Mediastinum/Nodes: No hilar, mediastinal, or  axillary adenopathy. Thyroid gland, trachea, and esophagus demonstrate no significant findings. Lungs/Pleura: There is new/worsening multifocal patchy predominantly peripherally based ground-glass opacities seen throughout both lungs. There is also slight interval worsening in the tree-in-bud nodular hazy opacities within the left lung apex. There is unchanged spiculated masslike consolidation within the right infrahilar region, likely from post treatment changes/fibrosis. There is a trace right pleural effusion present. Upper Abdomen: No acute abnormalities present in the visualized portions of the upper abdomen. Musculoskeletal: No chest wall abnormality. No acute or significant osseous findings. Again noted is diffuse skin thickening seen along the right breast. There is a surgical clips within the right breast. Review of the MIP images confirms the above findings. IMPRESSION: 1. No central, segmental, or subsegmental pulmonary embolism. 2. Interval worsening of the multifocal patchy ground-glass opacities throughout both lungs, consistent with multifocal pneumonia. 3. Interval slight worsening in the tree-in-bud nodular opacities within the left upper lobe, likely due to atypical infectious etiology, however cannot exclude underlying metastatic disease. 4. Post treatment radiation/fibrotic changes in the right infrahilar region. 5. Trace right pleural effusion Electronically Signed   By: Prudencio Pair M.D.   On: 08/06/2019 00:17   DG Chest Portable 1 View  Result Date: 08/05/2019 CLINICAL DATA:  59 year old female with hemoptysis. History of breast cancer. EXAM: PORTABLE CHEST 1 VIEW COMPARISON:  Chest radiograph dated 04/09/2017. CT dated 07/09/2019. FINDINGS: Port-A-Cath with tip  in the region of the cavoatrial junction. Right hilar density corresponding to the post treatment/post radiation fibrosis seen on the prior CT. Diffuse interstitial and vascular prominence with scattered faint reticulonodular  densities throughout the lungs. Probable small bilateral pleural effusions. No pneumothorax. The cardiac silhouette is within normal limits. Atherosclerotic calcification of the aorta. No acute osseous pathology. IMPRESSION: Right hilar post radiation fibrosis and scattered bilateral reticulonodular densities and small bilateral pleural effusions. Overall findings are similar to the CT of 07/09/2019. Electronically Signed   By: Anner Crete M.D.   On: 08/05/2019 22:59    Review of Systems Blood pressure 99/64, pulse 82, temperature 98.3 F (36.8 C), temperature source Oral, resp. rate 18, height 5\' 2"  (1.575 m), weight 90.7 kg, last menstrual period 04/04/2012, SpO2 97 %. Physical Exam GENERAL: Obese woman in no acute distress. HEAD: Normocephalic, atraumatic.  EYES: Pupils equal, round, reactive to light.  No scleral icterus.  MOUTH: Oral mucosa moist. NECK: Supple. No thyromegaly. No nodules. No JVD.  PULMONARY: Limited exam due to PPE/CAPR CARDIOVASCULAR: Pulses regular, CAPR limits exam GASTROINTESTINAL: Obese abdomen, no tenderness. MUSCULOSKELETAL: No joint deformity, no clubbing, 3+ pitting edema lower extremities to mid shin, skin tight.  NEUROLOGIC: Awake alert, no focal deficits. SKIN: Intact,warm,dry PSYCH: Normal mood and behavior   Assessment/Plan:  Dyspnea Bilateral nonspecific groundglass opacities Steroid responsive pulmonary process The patient appears to be at baseline I am concerned of gemcitabine pulmonary toxicity due to the temporal relationship of her worsening status to her infusions Though gemcitabine pulmonary toxicity appears to be rare it is indeed a possibility and she is exhibiting classic findings in this regard   Will discuss with Dr. 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   Renold Don, MD [facility] 08/06/2019, 7:58 PM    *This note was dictated using voice recognition software/Dragon.  Despite best efforts to proofread, errors can occur which can change the meaning.  Any change was purely unintentional.

## 2019-08-06 NOTE — Plan of Care (Signed)
Patient seen and data reviewed.  Full consult note to follow.  Renold Don, MD [facility]

## 2019-08-06 NOTE — Progress Notes (Signed)
PHARMACIST - PHYSICIAN ORDER COMMUNICATION  CONCERNING: P&T Medication Policy on Herbal Medications  DESCRIPTION:  This patient's order for:  BIOTIN  has been noted.  This product(s) is classified as an "herbal" or natural product. Due to a lack of definitive safety studies or FDA approval, nonstandard manufacturing practices, plus the potential risk of unknown drug-drug interactions while on inpatient medications, the Pharmacy and Therapeutics Committee does not permit the use of "herbal" or natural products of this type within [facility]   ACTION TAKEN: The pharmacy department is unable to verify this order at this time.  Please reevaluate patient's clinical condition at discharge and address if the herbal or natural product(s) should be resumed at that time.

## 2019-08-06 NOTE — H&P (Addendum)
[facility] NAME: Ashley Molina    MR#:  [id number]  DATE OF BIRTH:  [birth year]  DATE OF ADMISSION:  08/05/2019  PRIMARY CARE PHYSICIAN: Patient, No Pcp Per   REQUESTING/REFERRING PHYSICIAN: Hinda Kehr, MD CHIEF COMPLAINT:   Chief Complaint  Patient presents with  . Hemoptysis    HISTORY OF PRESENT ILLNESS:  Ashley Molina  is a 59 y.o. female with a known history of COVID-19 05/12/2019, dyslipidemia and right breast cancer with lung metastasis, status post lumpectomy, chemotherapy and radiotherapy, dyslipidemia and prediabetes mellitus, who presented to the emergency room with acute onset of worsening dyspnea over the last couple weeks with associated cough.  She experienced hemoptysis today.  She uses O2 at night.  She denies any fever or chills.  No nausea or vomiting or diarrhea.  No melena or bright red blood per rectum or other bleeding diathesis.  She admits to rhinorrhea and nasal congestion without sore throat or earache.  She stated that she received monoclonal antibody infusion at [facility] in December when she was diagnosed with COVID-19 in addition to oral steroids on an outpatient basis.  Upon arrival to the emergency room, blood pressure was 169/97 and later 122/75 with a pulse of 134  and later 98 and respiratory to 22 and pulse ox was 96 sent on 2 L of O2 nasal cannula and later 99%.  Labs revealed unremarkable CMP.  BNP was 208 and LDH 297 with high-sensitivity troponin I 9 then 13.  Ferritin was 216 lactic acid was 1.2 and later 1.1.  Procalcitonin was less than 0.1.  CBC showed WBCs of 10.97 hemoglobin 11 hematocrit 35.2 and platelets 263.  D-dimer was 2193.20 with fibrinogen of 375.  Chest CTA showed: Marland Kitchen No central, segmental, or subsegmental pulmonary embolism. 2. Interval worsening of the multifocal patchy ground-glass opacities throughout both lungs, consistent with multifocal pneumonia. 3. Interval slight  worsening in the tree-in-bud nodular opacities within the left upper lobe, likely due to atypical infectious etiology, however cannot exclude underlying metastatic disease. 4. Post treatment radiation/fibrotic changes in the right infrahilar region. 5. Trace right pleural effusion  The patient was given IV Decadron and remdesivir as well as IV Rocephin and Zithromax initially and 500 mL IV normal saline bolus.  She will be admitted to medical monitored isolation bed for further evaluation and management.  PAST MEDICAL HISTORY:   Past Medical History:  Diagnosis Date  . Anemia   . Breast cancer ([place])   . Breast cancer, right ([place]) 04/2017   Hx Lumpectomy, Chemo + Rad tx's.  . Breast cyst, right    aspirated by Dr. Bary Castilla  . COVID-19   . Hyperlipidemia   . Personal history of chemotherapy   . Pre-diabetes     PAST SURGICAL HISTORY:   Past Surgical History:  Procedure Laterality Date  . AXILLARY LYMPH NODE BIOPSY Right 04/09/2017   Procedure: AXILLARY LYMPH NODE BIOPSY;  Surgeon: Robert Bellow, MD;  Location: [facility] ORS;  Service: General;  Laterality: Right;  . BREAST BIOPSY Right 03/27/2017   US guided breast mass - invasive mammary carcinoma  . BREAST BIOPSY Right 03/27/2017   Lymph node - metastatic carcinoma  . BREAST BIOPSY Right 04/09/2017   Lymph node   . BREAST CYST ASPIRATION Right 11/2009   Dr. Bary Castilla did FNA  . BREAST EXCISIONAL BIOPSY Right 09/28/2017   lumpectomy and 12 lympnode rad neo adj chemo  . COLONOSCOPY  2011  .  DILATION AND CURETTAGE OF UTERUS     X3  . ENDOBRONCHIAL ULTRASOUND N/A 04/09/2017   Procedure: ENDOBRONCHIAL ULTRASOUND;  Surgeon: Laverle Hobby, MD;  Location: [facility] ORS;  Service: Pulmonary;  Laterality: N/A;  . ENDOMETRIAL ABLATION    . ENDOMETRIAL BIOPSY  09/2009  . PORTACATH PLACEMENT Left 04/09/2017   Procedure: INSERTION PORT-A-CATH;  Surgeon: Robert Bellow, MD;  Location: [facility] ORS;  Service: General;  Laterality: Left;     SOCIAL HISTORY:   Social History   Tobacco Use  . Smoking status: Never Smoker  . Smokeless tobacco: Never Used  Substance Use Topics  . Alcohol use: Not Currently    FAMILY HISTORY:   Family History  Problem Relation Age of Onset  . Melanoma Maternal Grandmother 3       currently 24  . Brain cancer Maternal Grandfather 46       unk. type; deceased in 78s  . Melanoma Other 51       mat grandmother's father  . Melanoma Father 46       on head; currently 79  . Prostate cancer Maternal Uncle        3 maternal uncles; dx in 54s  . Breast cancer Other        mat grandfather's sister; dx 98s    DRUG ALLERGIES:   Allergies  Allergen Reactions  . Penicillins Rash    REVIEW OF SYSTEMS:   ROS As per history of present illness. All pertinent systems were reviewed above. Constitutional,  HEENT, cardiovascular, respiratory, GI, GU, musculoskeletal, neuro, psychiatric, endocrine,  integumentary and hematologic systems were reviewed and are otherwise  negative/unremarkable except for positive findings mentioned above in the HPI.   MEDICATIONS AT HOME:   Prior to Admission medications   Medication Sig Start Date End Date Taking? Authorizing Provider  benzonatate (TESSALON PERLES) 100 MG capsule Take 2 capsules (200 mg total) by mouth 3 (three) times daily. Patient taking differently: Take 200 mg by mouth 3 (three) times daily as needed.  01/13/19 01/13/20  Laverle Hobby, MD  Biotin 2500 MCG CAPS Take 5,000 mcg/day by mouth daily.    [provider]  Cholecalciferol (VITAMIN D3) 10000 units TABS Take by mouth 1 day or 1 dose.    [provider]  Ferrous Sulfate (IRON) 325 (65 Fe) MG TABS Take 1 tablet by mouth daily.    [provider]  Fluticasone Furoate (ARNUITY ELLIPTA) 100 MCG/ACT AEPB Inhale 1 puff into the lungs daily. 06/11/19   Tyler Pita, MD  HYDROcodone-homatropine [facility]) 5-1.5 MG/5ML syrup Take 5 mLs by mouth every 6  (six) hours as needed for cough. 01/10/19   Sindy Guadeloupe, MD  Multiple Vitamins-Minerals (CENTRUM SILVER ADULT 50+) TABS Take 1 tablet by mouth daily.    [provider]  Omega-3 Fatty Acids (FISH OIL) 1000 MG CAPS Take 1 capsule by mouth 1 day or 1 dose.    [provider]  pantoprazole (PROTONIX) 20 MG tablet Take 1 tablet (20 mg total) by mouth daily. 06/20/19   Sindy Guadeloupe, MD  predniSONE (DELTASONE) 10 MG tablet 1 tablet daily. 07/16/19   Tyler Pita, MD  predniSONE (DELTASONE) 20 MG tablet Take 1 tablet (20 mg total) by mouth daily. 08/05/19 08/04/20  Tyler Pita, MD  sulfamethoxazole-trimethoprim (BACTRIM DS) 800-160 MG tablet Take 1 tablet by mouth 3 (three) times a week. Patient not taking: Reported on 07/17/2019 06/16/19   Sindy Guadeloupe, MD  sulfamethoxazole-trimethoprim (BACTRIM DS)  800-160 MG tablet 1 tablet three times per week. 07/28/19   Tyler Pita, MD  prochlorperazine (COMPAZINE) 10 MG tablet Take 1 tablet (10 mg total) by mouth every 6 (six) hours as needed (Nausea or vomiting). 04/05/17 09/05/18  Sindy Guadeloupe, MD      VITAL SIGNS:  Blood pressure (!) 130/92, pulse 97, temperature 98.3 F (36.8 C), temperature source Oral, resp. rate 18, height 5\' 2"  (1.575 m), weight 90.7 kg, last menstrual period 04/04/2012, SpO2 100 %.  PHYSICAL EXAMINATION:  Physical Exam  GENERAL:  60 y.o.-year-old Caucasian female patient lying in the bed with mild respiratory distress with conversational dyspnea. -EYES: Pupils equal, round, reactive to light and accommodation. No scleral icterus. Extraocular muscles intact.  HEENT: Head atraumatic, normocephalic. Oropharynx and nasopharynx clear.  NECK:  Supple, no jugular venous distention. No thyroid enlargement, no tenderness.  LUNGS: Diminished bibasal breath sounds with bibasal crackles. CARDIOVASCULAR: Regular rate and rhythm, S1, S2 normal. No murmurs, rubs, or gallops.  ABDOMEN: Soft, nondistended,  nontender. Bowel sounds present. No organomegaly or mass.  EXTREMITIES: No pedal edema, cyanosis, or clubbing.  NEUROLOGIC: Cranial nerves II through XII are intact. Muscle strength 5/5 in all extremities. Sensation intact. Gait not checked.  PSYCHIATRIC: The patient is alert and oriented x 3.  Normal affect and good eye contact. SKIN: No obvious rash, lesion, or ulcer.   LABORATORY PANEL:   CBC Recent Labs  Lab 08/05/19 2230  WBC 10.7*  HGB 11.0*  HCT 35.2*  PLT 263   ------------------------------------------------------------------------------------------------------------------  Chemistries  Recent Labs  Lab 08/05/19 2230  NA 142  K 4.0  CL 105  CO2 25  GLUCOSE 116*  BUN 18  CREATININE 0.58  CALCIUM 9.1  AST 21  ALT 20  ALKPHOS 140*  BILITOT 0.5   ------------------------------------------------------------------------------------------------------------------  Cardiac Enzymes No results for input(s): TROPONINI in the last 168 hours. ------------------------------------------------------------------------------------------------------------------  RADIOLOGY:  CT Angio Chest PE W and/or Wo Contrast  Result Date: 08/06/2019 CLINICAL DATA:  Hemoptysis, history of breast cancer and COVID EXAM: CT ANGIOGRAPHY CHEST WITH CONTRAST TECHNIQUE: Multidetector CT imaging of the chest was performed using the standard protocol during bolus administration of intravenous contrast. Multiplanar CT image reconstructions and MIPs were obtained to evaluate the vascular anatomy. CONTRAST:  19mL OMNIPAQUE IOHEXOL 350 MG/ML SOLN COMPARISON:  July 09, 2019 FINDINGS: Cardiovascular: There is a optimal opacification of the pulmonary arteries. There is no central,segmental, or subsegmental filling defects within the pulmonary arteries. A left-sided MediPort catheter is seen with the tip in the right atrium. The heart is normal in size. No pericardial effusion or thickening. No evidence right  heart strain. There is normal three-vessel brachiocephalic anatomy without proximal stenosis. Scattered mild aortic atherosclerosis. Mediastinum/Nodes: No hilar, mediastinal, or axillary adenopathy. Thyroid gland, trachea, and esophagus demonstrate no significant findings. Lungs/Pleura: There is new/worsening multifocal patchy predominantly peripherally based ground-glass opacities seen throughout both lungs. There is also slight interval worsening in the tree-in-bud nodular hazy opacities within the left lung apex. There is unchanged spiculated masslike consolidation within the right infrahilar region, likely from post treatment changes/fibrosis. There is a trace right pleural effusion present. Upper Abdomen: No acute abnormalities present in the visualized portions of the upper abdomen. Musculoskeletal: No chest wall abnormality. No acute or significant osseous findings. Again noted is diffuse skin thickening seen along the right breast. There is a surgical clips within the right breast. Review of the MIP images confirms the above findings. IMPRESSION: 1. No central, segmental, or subsegmental  pulmonary embolism. 2. Interval worsening of the multifocal patchy ground-glass opacities throughout both lungs, consistent with multifocal pneumonia. 3. Interval slight worsening in the tree-in-bud nodular opacities within the left upper lobe, likely due to atypical infectious etiology, however cannot exclude underlying metastatic disease. 4. Post treatment radiation/fibrotic changes in the right infrahilar region. 5. Trace right pleural effusion Electronically Signed   By: Prudencio Pair M.D.   On: 08/06/2019 00:17   DG Chest Portable 1 View  Result Date: 08/05/2019 CLINICAL DATA:  59 year old female with hemoptysis. History of breast cancer. EXAM: PORTABLE CHEST 1 VIEW COMPARISON:  Chest radiograph dated 04/09/2017. CT dated 07/09/2019. FINDINGS: Port-A-Cath with tip in the region of the cavoatrial junction. Right hilar  density corresponding to the post treatment/post radiation fibrosis seen on the prior CT. Diffuse interstitial and vascular prominence with scattered faint reticulonodular densities throughout the lungs. Probable small bilateral pleural effusions. No pneumothorax. The cardiac silhouette is within normal limits. Atherosclerotic calcification of the aorta. No acute osseous pathology. IMPRESSION: Right hilar post radiation fibrosis and scattered bilateral reticulonodular densities and small bilateral pleural effusions. Overall findings are similar to the CT of 07/09/2019. Electronically Signed   By: Anner Crete M.D.   On: 08/05/2019 22:59      IMPRESSION AND PLAN:   1.  Multifocal pneumonia and acute hypoxemic respiratory failure like secondary to COVID-19 especially given negative procalcitonin. -The patient will be admitted to a medical monitored isolation bed. -We will continue her on IV remdesivir and Decadron. -She will be placed on vitamin D3 and aspirin. -We will follow CRP and inflammatory markers. -O2 protocol will be followed -CRP came back 0.6.  2.  Dyslipidemia. -This is likely diet managed. -We will check fasting lipids  3.  History of metastatic right breast cancer, status post lumpectomy, chemotherapy and radiotherapy. -She has been in remission.  4.  Prediabetes mellitus. -Check her hemoglobin A1c level and monitor blood glucose.  5.  DVT prophylaxis. -Subcutaneous Lovenox.    All the records are reviewed and case discussed with ED provider. The plan of care was discussed in details with the patient (and family). I answered all questions. The patient agreed to proceed with the above mentioned plan. Further management will depend upon hospital course.   CODE STATUS: Full code  TOTAL TIME TAKING CARE OF THIS PATIENT: 55 minutes.    Christel Mormon M.D on 08/06/2019 at 1:27 AM  [facility]   From 7 PM-7 AM, contact night-coverage [email or web address]  CC:  Primary care physician; Patient, No Pcp Per   Note: This dictation was prepared with Dragon dictation along with smaller phrase technology. Any transcriptional errors that result from this process are unintentional.

## 2019-08-06 NOTE — Progress Notes (Addendum)
PROGRESS NOTE    Ashley Molina  [id number] DOB: 1960-10-30 DOA: 08/05/2019 PCP: Patient, No Pcp Per    Assessment & Plan:   Active Problems:   COVID-19    Ashley Molina  is a 59 y.o. female with a known history of COVID-19 05/12/2019, dyslipidemia and right breast cancer with lung metastasis, status post lumpectomy, chemotherapy and radiotherapy, dyslipidemia and prediabetes mellitus, who presented to the emergency room with acute onset of worsening dyspnea over the last couple weeks with associated cough.  She experienced hemoptysis today.  She uses O2 at night.  She denies any fever or chills.  No nausea or vomiting or diarrhea.  No melena or bright red blood per rectum or other bleeding diathesis.  She admits to rhinorrhea and nasal congestion without sore throat or earache.  She stated that she received monoclonal antibody infusion at [facility] in December when she was diagnosed with COVID-19 in addition to oral steroids on an outpatient basis.   # DOE # Hemoptysis  # [id number] 05/12/2019 # [name] PRN # Hx of radiation to lungs 2 years ago -DOE has been gradually worsening.  Hemoptysis was just prior to presentation, and per picture, was sputum mixed with blood. --Pt completed COVID tx >2 months ago, currently no significant increased O2 requirement, CRP 0.6 (so unlikely post-COVID inflammatory response), so no need for COVID tx, nor airborne isolation. --CT chest showed "Interval worsening of the multifocal patchy ground-glass opacities throughout both lungs" and "Interval slight worsening in the tree-in-bud nodular opacities within the left upper lobe".  Pt does have hx of radiation exposure to lungs about 2 years ago and currently is on chemo which may have lung toxicity. --Procal <0.1, so unlikely to have bacterial PNA. PLAN: -d/c IV remdesivir and Decadron. -d/c airborne precaution --d/c ceftriaxone and doxy --continue home  prednisone 20 mg daily --continue home ARNUITY as Pulmicort -Home 2L supplemental O2 PRN --Pulm consult --Oncology to see pt tomorrow --Trial IV lasix 20 mg x1 today   2.  Dyslipidemia. -LDL 131  3.  History of metastatic right breast cancer, status post lumpectomy, chemotherapy and radiotherapy. -She has been in remission. --continue home ppx Bactrim MWF --Oncology to see pt tomorrow  4.  Prediabetes mellitus. -A1c 5.4   DVT prophylaxis: Lovenox SQ Code Status: Full code  Family Communication: not today Disposition Plan: Home, after pulm and onc clear.   Subjective and Interval History:  Pt reported feeling better after coughing up stuff from her lungs.  Has DOE, and noted more swelling in her legs.  No fever, chest pain, abdominal pain, N/V/D, dysuria.   Objective: Vitals:   08/06/19 0349 08/06/19 0739 08/06/19 1147 08/06/19 1600  BP: 128/83 122/79 113/72 114/64  Pulse: 98 87 91 89  Resp: 20 18 18 18   Temp: 98.3 F (36.8 C) 98.2 F (36.8 C) 98.3 F (36.8 C) 98.3 F (36.8 C)  TempSrc: Oral     SpO2: 99% 99% 99% 99%  Weight:      Height:        Intake/Output Summary (Last 24 hours) at 08/06/2019 1706 Last data filed at 08/06/2019 1500 Gross per 24 hour  Intake 1470.99 ml  Output --  Net 1470.99 ml   Filed Weights   08/05/19 2207  Weight: 90.7 kg    Examination:   Constitutional: NAD, AAOx3 HEENT: conjunctivae and lids normal, EOMI CV: RRR no M,R,G. Distal pulses +2.  No cyanosis.   RESP: CTA  B/L, normal respiratory effort, on 3L GI: +BS, NTND Extremities: Mild edema in BLE SKIN: warm, dry and intact Neuro: II - XII grossly intact.  Sensation intact Psych: Normal mood and affect.  Appropriate judgement and reason   Data Reviewed: I have personally reviewed following labs and imaging studies  CBC: Recent Labs  Lab 08/05/19 2230  WBC 10.7*  NEUTROABS 8.8*  HGB 11.0*  HCT 35.2*  MCV 94.4  PLT 99991111   Basic Metabolic Panel: Recent Labs   Lab 08/05/19 2230  NA 142  K 4.0  CL 105  CO2 25  GLUCOSE 116*  BUN 18  CREATININE 0.58  CALCIUM 9.1   GFR: Estimated Creatinine Clearance: 78.3 mL/min (by C-G formula based on SCr of 0.58 mg/dL). Liver Function Tests: Recent Labs  Lab 08/05/19 2230  AST 21  ALT 20  ALKPHOS 140*  BILITOT 0.5  PROT 7.0  ALBUMIN 3.9   No results for input(s): LIPASE, AMYLASE in the last 168 hours. No results for input(s): AMMONIA in the last 168 hours. Coagulation Profile: Recent Labs  Lab 08/05/19 2230  INR 1.0   Cardiac Enzymes: No results for input(s): CKTOTAL, CKMB, CKMBINDEX, TROPONINI in the last 168 hours. BNP (last 3 results) No results for input(s): PROBNP in the last 8760 hours. HbA1C: Recent Labs    08/06/19 0548  HGBA1C 5.4   CBG: No results for input(s): GLUCAP in the last 168 hours. Lipid Profile: Recent Labs    08/06/19 0038 08/06/19 0548  CHOL  --  195  HDL  --  54  LDLCALC  --  131*  TRIG 84 51  CHOLHDL  --  3.6   Thyroid Function Tests: No results for input(s): TSH, T4TOTAL, FREET4, T3FREE, THYROIDAB in the last 72 hours. Anemia Panel: Recent Labs    08/05/19 2230  FERRITIN 216   Sepsis Labs: Recent Labs  Lab 08/05/19 2230 08/06/19 0038  PROCALCITON  --  <0.10  LATICACIDVEN 1.2 1.1    Recent Results (from the past 240 hour(s))  Blood Culture (routine x 2)     Status: None (Preliminary result)   Collection Time: 08/06/19  1:00 AM   Specimen: BLOOD  Result Value Ref Range Status   Specimen Description BLOOD RIGHT HAND  Final   Special Requests   Final    BOTTLES DRAWN AEROBIC AND ANAEROBIC Blood Culture results may not be optimal due to an inadequate volume of blood received in culture bottles   Culture   Final    NO GROWTH < 12 HOURS Performed at [facility], [address] [place], [place] [address]    Report Status PENDING  Incomplete  Blood Culture (routine x 2)     Status: None (Preliminary result)   Collection  Time: 08/06/19  1:01 AM   Specimen: BLOOD  Result Value Ref Range Status   Specimen Description BLOOD RIGHT ARM  Final   Special Requests   Final    BOTTLES DRAWN AEROBIC AND ANAEROBIC Blood Culture results may not be optimal due to an inadequate volume of blood received in culture bottles   Culture   Final    NO GROWTH < 12 HOURS Performed at [facility], [address] [place], [place] [address]    Report Status PENDING  Incomplete      Radiology Studies: CT Angio Chest PE W and/or Wo Contrast  Result Date: 08/06/2019 CLINICAL DATA:  Hemoptysis, history of breast cancer and COVID EXAM: CT ANGIOGRAPHY CHEST WITH CONTRAST  TECHNIQUE: Multidetector CT imaging of the chest was performed using the standard protocol during bolus administration of intravenous contrast. Multiplanar CT image reconstructions and MIPs were obtained to evaluate the vascular anatomy. CONTRAST:  32mL OMNIPAQUE IOHEXOL 350 MG/ML SOLN COMPARISON:  July 09, 2019 FINDINGS: Cardiovascular: There is a optimal opacification of the pulmonary arteries. There is no central,segmental, or subsegmental filling defects within the pulmonary arteries. A left-sided MediPort catheter is seen with the tip in the right atrium. The heart is normal in size. No pericardial effusion or thickening. No evidence right heart strain. There is normal three-vessel brachiocephalic anatomy without proximal stenosis. Scattered mild aortic atherosclerosis. Mediastinum/Nodes: No hilar, mediastinal, or axillary adenopathy. Thyroid gland, trachea, and esophagus demonstrate no significant findings. Lungs/Pleura: There is new/worsening multifocal patchy predominantly peripherally based ground-glass opacities seen throughout both lungs. There is also slight interval worsening in the tree-in-bud nodular hazy opacities within the left lung apex. There is unchanged spiculated masslike consolidation within the right infrahilar region, likely from post  treatment changes/fibrosis. There is a trace right pleural effusion present. Upper Abdomen: No acute abnormalities present in the visualized portions of the upper abdomen. Musculoskeletal: No chest wall abnormality. No acute or significant osseous findings. Again noted is diffuse skin thickening seen along the right breast. There is a surgical clips within the right breast. Review of the MIP images confirms the above findings. IMPRESSION: 1. No central, segmental, or subsegmental pulmonary embolism. 2. Interval worsening of the multifocal patchy ground-glass opacities throughout both lungs, consistent with multifocal pneumonia. 3. Interval slight worsening in the tree-in-bud nodular opacities within the left upper lobe, likely due to atypical infectious etiology, however cannot exclude underlying metastatic disease. 4. Post treatment radiation/fibrotic changes in the right infrahilar region. 5. Trace right pleural effusion Electronically Signed   By: Prudencio Pair M.D.   On: 08/06/2019 00:17   DG Chest Portable 1 View  Result Date: 08/05/2019 CLINICAL DATA:  59 year old female with hemoptysis. History of breast cancer. EXAM: PORTABLE CHEST 1 VIEW COMPARISON:  Chest radiograph dated 04/09/2017. CT dated 07/09/2019. FINDINGS: Port-A-Cath with tip in the region of the cavoatrial junction. Right hilar density corresponding to the post treatment/post radiation fibrosis seen on the prior CT. Diffuse interstitial and vascular prominence with scattered faint reticulonodular densities throughout the lungs. Probable small bilateral pleural effusions. No pneumothorax. The cardiac silhouette is within normal limits. Atherosclerotic calcification of the aorta. No acute osseous pathology. IMPRESSION: Right hilar post radiation fibrosis and scattered bilateral reticulonodular densities and small bilateral pleural effusions. Overall findings are similar to the CT of 07/09/2019. Electronically Signed   By: Anner Crete M.D.    On: 08/05/2019 22:59     Scheduled Meds: . aspirin EC  81 mg Oral Daily  . budesonide  1 puff Inhalation BID  . enoxaparin (LOVENOX) injection  40 mg Subcutaneous Q24H  . famotidine  20 mg Oral BID  . ferrous sulfate  325 mg Oral QHS  . predniSONE  20 mg Oral Daily  . sulfamethoxazole-trimethoprim  1 tablet Oral Once per day on Mon Wed Fri   Continuous Infusions:   LOS: 0 days    This is a no-charge note.   Enzo Bi, MD [facility] If 7PM-7AM, please contact night-coverage 08/06/2019, 5:06 PM

## 2019-08-06 NOTE — Plan of Care (Addendum)
VSS. Denies any pain. Isolation precautions discontinued. Intermittent cough present, pt denies need for any prn medication. Denies hemoptysis. Pt up ad lib, independent w/ needs. Assessment as documented.   Pt sleeping in chair overnight.  Problem: Clinical Measurements: Goal: Ability to maintain clinical measurements within normal limits will improve Outcome: Progressing Goal: Will remain free from infection Outcome: Progressing Goal: Diagnostic test results will improve Outcome: Progressing Goal: Respiratory complications will improve Outcome: Progressing Goal: Cardiovascular complication will be avoided Outcome: Progressing   Problem: Activity: Goal: Risk for activity intolerance will decrease Outcome: Progressing   Problem: Nutrition: Goal: Adequate nutrition will be maintained Outcome: Progressing   Problem: Coping: Goal: Level of anxiety will decrease Outcome: Progressing   Problem: Elimination: Goal: Will not experience complications related to bowel motility Outcome: Progressing Goal: Will not experience complications related to urinary retention Outcome: Progressing   Problem: Pain Managment: Goal: General experience of comfort will improve Outcome: Progressing   Problem: Safety: Goal: Ability to remain free from injury will improve Outcome: Progressing   Problem: Skin Integrity: Goal: Risk for impaired skin integrity will decrease Outcome: Progressing

## 2019-08-06 NOTE — Plan of Care (Signed)
  Problem: Education: Goal: Knowledge of General Education information will improve Description: Including pain rating scale, medication(s)/side effects and non-pharmacologic comfort measures Outcome: Completed/Met   Problem: Health Behavior/Discharge Planning: Goal: Ability to manage health-related needs will improve Outcome: Completed/Met

## 2019-08-06 NOTE — Progress Notes (Signed)
Remdesivir - Pharmacy Brief Note   O:  ALT: 20 CXR: evidence of worsening infection SpO2: 99% on [place]   A/P:  Remdesivir 200 mg IVPB once followed by 100 mg IVPB daily x 4 days.   Hart Robinsons, PharmD Clinical Pharmacist  08/06/2019 1:00 AM

## 2019-08-07 ENCOUNTER — Encounter: Payer: Self-pay | Admitting: *Deleted

## 2019-08-07 ENCOUNTER — Inpatient Hospital Stay: Payer: Managed Care, Other (non HMO)

## 2019-08-07 ENCOUNTER — Inpatient Hospital Stay: Payer: Managed Care, Other (non HMO) | Admitting: Oncology

## 2019-08-07 DIAGNOSIS — R0600 Dyspnea, unspecified: Secondary | ICD-10-CM

## 2019-08-07 LAB — BASIC METABOLIC PANEL
Anion gap: 9 (ref 5–15)
BUN: 19 mg/dL (ref 6–20)
CO2: 25 mmol/L (ref 22–32)
Calcium: 9 mg/dL (ref 8.9–10.3)
Chloride: 107 mmol/L (ref 98–111)
Creatinine, Ser: 0.55 mg/dL (ref 0.44–1.00)
GFR calc Af Amer: >60 mL/min (ref >60)
GFR calc non Af Amer: >60 mL/min (ref >60)
Glucose, Bld: 93 mg/dL (ref 70–99)
Potassium: 3.9 mmol/L (ref 3.5–5.1)
Sodium: 141 mmol/L (ref 135–145)

## 2019-08-07 LAB — CBC
HCT: 31.7 % — ABNORMAL LOW (ref 36.0–46.0)
Hemoglobin: 9.7 g/dL — ABNORMAL LOW (ref 12.0–15.0)
MCH: 29 pg (ref 26.0–34.0)
MCHC: 30.6 g/dL (ref 30.0–36.0)
MCV: 94.6 fL (ref 80.0–100.0)
Platelets: 289 10*3/uL (ref 150–400)
RBC: 3.35 MIL/uL — ABNORMAL LOW (ref 3.87–5.11)
RDW: 17.6 % — ABNORMAL HIGH (ref 11.5–15.5)
WBC: 9.4 10*3/uL (ref 4.0–10.5)
nRBC: 0 % (ref 0.0–0.2)

## 2019-08-07 LAB — MAGNESIUM: Magnesium: 2.1 mg/dL (ref 1.7–2.4)

## 2019-08-07 MED ORDER — FUROSEMIDE 20 MG PO TABS: 20.0000 mg | ORAL_TABLET | ORAL | 0 refills | Status: DC

## 2019-08-07 MED ORDER — BENZONATATE 100 MG PO CAPS: 200.0000 mg | ORAL_CAPSULE | Freq: Three times a day (TID) | ORAL | Status: DC | PRN

## 2019-08-07 MED ORDER — FUROSEMIDE 10 MG/ML IJ SOLN
20.0000 mg | Freq: Once | INTRAMUSCULAR | Status: AC
  Administered 2019-08-07: 20 mg via INTRAVENOUS
  Filled 2019-08-07: qty 2

## 2019-08-07 NOTE — Discharge Summary (Signed)
Physician Discharge Summary   ROSEBELL SUMMEROUR  female DOB: 1961/01/08  [id number]  PCP: Patient, No Pcp Per  Admit date: 08/05/2019 Discharge date: 08/07/2019  Admitted From: home Disposition:  home CODE STATUS: Full code  Discharge Instructions    Diet - low sodium heart healthy   Complete by: As directed    Discharge instructions   Complete by: As directed    I have discussed with Dr. Janese Banks and Dr. Patsey Berthold, and since you are feeling better, they are ok with you going home today.  You will follow up with both in clinic.    I am sending you home on a small dose of diuretic, Lasix 20 mg, to be taken every other day, which may help with your breathing.   Dr. Enzo Bi - -   Increase activity slowly   Complete by: As directed        Hospital Course:  For full details, please see H&P, progress notes, consult notes and ancillary notes.  Briefly,  [name] a58 y.o.Caucasian femalewith a known history of COVID-19 05/12/2019, [name] nightly and PRN, and right breast cancer with lung metastasis s/p lumpectomy, chemotherapy and radiotherapy, who presented to the emergency room with acute onset of worsening dyspnea over the last couple weeks with associated cough and production of bloody sputum.   # DOE # Hemoptysis  # [id number] 05/12/2019 # [name] PRN # Hx of radiation to lungs 2 years ago DOE has been gradually worsening.  Hemoptysis was just prior to presentation, and per picture, was sputum mixed with blood.  Pt had completed COVID tx >2 months ago, currently no significant increased O2 requirement, CRP 0.6 (so unlikely post-COVID inflammatory response), so no need for COVID tx, nor airborne isolation.  CT chest showed "Interval worsening of the multifocal patchy ground-glass opacities throughout both lungs" and "Interval slight worsening in the tree-in-bud nodular opacities within the left  upper lobe".  Pt does have hx of radiation exposure to lungs about 2 years ago and currently is on chemo which may have lung toxicity.  Procal <0.1, so unlikely to have bacterial PNA, so abx was not continued.  Pt was continued on home prednisone 20 mg daily and home ARNUITY as Pulmicort.  Due to reported increased LE swelling, pt was tried on IV lasix 20 mg daily x 2 days, with good urine output and improvement in her dyspnea.  Pt was discharged on PO Lasix 20 mg every other day.  Pt had no more bloody sputum after presentation, and felt breathing was back to baseline prior to discharge.  Pt's case discussed with Dr. Janese Banks and Dr. Patsey Berthold, who will continue to follow up with pt as outpatient.  Dyslipidemia. LDL 131  History of metastatic right breast cancer, status post lumpectomy, chemotherapy and radiotherapy. She has been in remission.  Pt was continued on home ppx Bactrim MWF and prednisone 20 mg daily.  Pt will follow up with her oncologist Dr. Janese Banks.  Hx of Prediabetes mellitus A1c 5.4, controlled.   Discharge Diagnoses:  Active Problems:   COVID-19    Discharge Instructions:  Allergies as of 08/07/2019      Reactions   Penicillins Rash      Medication List    TAKE these medications   Arnuity Ellipta 100 MCG/ACT Aepb Generic drug: Fluticasone Furoate Inhale 1 puff into the lungs daily.   benzonatate 100 MG capsule Commonly known as: Best boy Take 2  capsules (200 mg total) by mouth 3 (three) times daily as needed.   Biotin 2500 MCG Caps Take 5,000 mcg/day by mouth at bedtime.   Centrum Silver Adult 50+ Tabs Take 1 tablet by mouth at bedtime.   Fish Oil 1000 MG Caps Take 1 capsule by mouth at bedtime.   furosemide 20 MG tablet Commonly known as: Lasix Take 1 tablet (20 mg total) by mouth every other day. Diuretic, water pill.   HYDROcodone-homatropine 5-1.5 MG/5ML syrup Commonly known as: HYCODAN Take 5 mLs by mouth every 6 (six) hours as needed for  cough.   Iron 325 (65 Fe) MG Tabs Take 1 tablet by mouth at bedtime.   pantoprazole 20 MG tablet Commonly known as: Protonix Take 1 tablet (20 mg total) by mouth daily.   predniSONE 20 MG tablet Commonly known as: Deltasone Take 1 tablet (20 mg total) by mouth daily. What changed: Another medication with the same name was removed. Continue taking this medication, and follow the directions you see here.   sulfamethoxazole-trimethoprim 800-160 MG tablet Commonly known as: BACTRIM DS 1 tablet three times per week. What changed: Another medication with the same name was removed. Continue taking this medication, and follow the directions you see here.   Vitamin D3 250 MCG (10000 UT) Tabs Take 1 tablet by mouth at bedtime.         Allergies  Allergen Reactions   Penicillins Rash     The results of significant diagnostics from this hospitalization (including imaging, microbiology, ancillary and laboratory) are listed below for reference.   Consultations:   Procedures/Studies: CT Chest W Contrast  Result Date: 07/09/2019 CLINICAL DATA:  Restaging metastatic breast cancer, COVID-19 05/2019 EXAM: CT CHEST, ABDOMEN, AND PELVIS WITH CONTRAST TECHNIQUE: Multidetector CT imaging of the chest, abdomen and pelvis was performed following the standard protocol during bolus administration of intravenous contrast. CONTRAST:  191mL OMNIPAQUE IOHEXOL 300 MG/ML SOLN, additional oral enteric contrast COMPARISON:  04/04/2019, 01/02/2019 FINDINGS: CT CHEST FINDINGS Cardiovascular: Left chest port catheter. Aortic atherosclerosis. Normal heart size. No pericardial effusion. Mediastinum/Nodes: No enlarged mediastinal, hilar, or axillary lymph nodes. Thyroid gland, trachea, and esophagus demonstrate no significant findings. Lungs/Pleura: Unchanged post treatment/radiation consolidation and fibrosis of the perihilar right lung. Unchanged small right, trace left pleural effusions. There has been near total  resolution of previously seen fine, randomly distributed pulmonary nodularity, with occasional small persistent subpleural nodules, for example in the right apex (series 3, image 31). There is persistent interstitial septal thickening. There are new, scattered, predominantly peripheral and subpleural ground-glass opacities Musculoskeletal: No chest wall mass or suspicious bone lesions identified. Status post right lumpectomy and axillary lymph node dissection with skin thickening of the right breast. CT ABDOMEN PELVIS FINDINGS Hepatobiliary: No solid liver abnormality is seen. No gallstones, gallbladder wall thickening, or biliary dilatation. Pancreas: Unremarkable. No pancreatic ductal dilatation or surrounding inflammatory changes. Spleen: Normal in size without significant abnormality. Adrenals/Urinary Tract: Adrenal glands are unremarkable. Kidneys are normal, without renal calculi, solid lesion, or hydronephrosis. Bladder is unremarkable. Stomach/Bowel: Stomach is within normal limits. Appendix appears normal. No evidence of bowel wall thickening, distention, or inflammatory changes. Vascular/Lymphatic: Aortic atherosclerosis. No enlarged abdominal or pelvic lymph nodes. Reproductive: No mass or other abnormality. Other: No abdominal wall hernia or abnormality. No abdominopelvic ascites. Musculoskeletal: No acute or significant osseous findings. IMPRESSION: 1. Unchanged post treatment/radiation consolidation of fibrosis of the perihilar right lung. 2. Near total resolution of previously seen fine, randomly distributed pulmonary nodularity, with occasional small persistent  subpleural nodules, for example in the right apex. This may reflect treatment response of metastatic disease or alternately resolution of atypical infection, unclear and very difficult to distinguish. 3. Persistent interstitial septal thickening and small right, trace left pleural effusions, consistent with edema and suspicious for a component  of lymphangitic metastasis. 4. Interval development of scattered, predominantly peripheral and subpleural ground-glass opacities, which are generally consistent with reported history of recent COVID-19 infection, although also similar in appearance and morphology to ground-glass opacities seen on prior examinations. Differential considerations further include drug toxicity given ongoing chemotherapy. 5. Redemonstrated postoperative findings of right breast lumpectomy. 6. No evidence of metastatic disease within the abdomen or pelvis. 7.  Aortic Atherosclerosis (ICD10-I70.0). Electronically Signed   By: Eddie Candle M.D.   On: 07/09/2019 10:46   CT Angio Chest PE W and/or Wo Contrast  Result Date: 08/06/2019 CLINICAL DATA:  Hemoptysis, history of breast cancer and COVID EXAM: CT ANGIOGRAPHY CHEST WITH CONTRAST TECHNIQUE: Multidetector CT imaging of the chest was performed using the standard protocol during bolus administration of intravenous contrast. Multiplanar CT image reconstructions and MIPs were obtained to evaluate the vascular anatomy. CONTRAST:  56mL OMNIPAQUE IOHEXOL 350 MG/ML SOLN COMPARISON:  July 09, 2019 FINDINGS: Cardiovascular: There is a optimal opacification of the pulmonary arteries. There is no central,segmental, or subsegmental filling defects within the pulmonary arteries. A left-sided MediPort catheter is seen with the tip in the right atrium. The heart is normal in size. No pericardial effusion or thickening. No evidence right heart strain. There is normal three-vessel brachiocephalic anatomy without proximal stenosis. Scattered mild aortic atherosclerosis. Mediastinum/Nodes: No hilar, mediastinal, or axillary adenopathy. Thyroid gland, trachea, and esophagus demonstrate no significant findings. Lungs/Pleura: There is new/worsening multifocal patchy predominantly peripherally based ground-glass opacities seen throughout both lungs. There is also slight interval worsening in the  tree-in-bud nodular hazy opacities within the left lung apex. There is unchanged spiculated masslike consolidation within the right infrahilar region, likely from post treatment changes/fibrosis. There is a trace right pleural effusion present. Upper Abdomen: No acute abnormalities present in the visualized portions of the upper abdomen. Musculoskeletal: No chest wall abnormality. No acute or significant osseous findings. Again noted is diffuse skin thickening seen along the right breast. There is a surgical clips within the right breast. Review of the MIP images confirms the above findings. IMPRESSION: 1. No central, segmental, or subsegmental pulmonary embolism. 2. Interval worsening of the multifocal patchy ground-glass opacities throughout both lungs, consistent with multifocal pneumonia. 3. Interval slight worsening in the tree-in-bud nodular opacities within the left upper lobe, likely due to atypical infectious etiology, however cannot exclude underlying metastatic disease. 4. Post treatment radiation/fibrotic changes in the right infrahilar region. 5. Trace right pleural effusion Electronically Signed   By: Prudencio Pair M.D.   On: 08/06/2019 00:17   CT Abdomen Pelvis W Contrast  Result Date: 07/09/2019 CLINICAL DATA:  Restaging metastatic breast cancer, COVID-19 05/2019 EXAM: CT CHEST, ABDOMEN, AND PELVIS WITH CONTRAST TECHNIQUE: Multidetector CT imaging of the chest, abdomen and pelvis was performed following the standard protocol during bolus administration of intravenous contrast. CONTRAST:  18mL OMNIPAQUE IOHEXOL 300 MG/ML SOLN, additional oral enteric contrast COMPARISON:  04/04/2019, 01/02/2019 FINDINGS: CT CHEST FINDINGS Cardiovascular: Left chest port catheter. Aortic atherosclerosis. Normal heart size. No pericardial effusion. Mediastinum/Nodes: No enlarged mediastinal, hilar, or axillary lymph nodes. Thyroid gland, trachea, and esophagus demonstrate no significant findings. Lungs/Pleura:  Unchanged post treatment/radiation consolidation and fibrosis of the perihilar right lung. Unchanged small right, trace  left pleural effusions. There has been near total resolution of previously seen fine, randomly distributed pulmonary nodularity, with occasional small persistent subpleural nodules, for example in the right apex (series 3, image 31). There is persistent interstitial septal thickening. There are new, scattered, predominantly peripheral and subpleural ground-glass opacities Musculoskeletal: No chest wall mass or suspicious bone lesions identified. Status post right lumpectomy and axillary lymph node dissection with skin thickening of the right breast. CT ABDOMEN PELVIS FINDINGS Hepatobiliary: No solid liver abnormality is seen. No gallstones, gallbladder wall thickening, or biliary dilatation. Pancreas: Unremarkable. No pancreatic ductal dilatation or surrounding inflammatory changes. Spleen: Normal in size without significant abnormality. Adrenals/Urinary Tract: Adrenal glands are unremarkable. Kidneys are normal, without renal calculi, solid lesion, or hydronephrosis. Bladder is unremarkable. Stomach/Bowel: Stomach is within normal limits. Appendix appears normal. No evidence of bowel wall thickening, distention, or inflammatory changes. Vascular/Lymphatic: Aortic atherosclerosis. No enlarged abdominal or pelvic lymph nodes. Reproductive: No mass or other abnormality. Other: No abdominal wall hernia or abnormality. No abdominopelvic ascites. Musculoskeletal: No acute or significant osseous findings. IMPRESSION: 1. Unchanged post treatment/radiation consolidation of fibrosis of the perihilar right lung. 2. Near total resolution of previously seen fine, randomly distributed pulmonary nodularity, with occasional small persistent subpleural nodules, for example in the right apex. This may reflect treatment response of metastatic disease or alternately resolution of atypical infection, unclear and very  difficult to distinguish. 3. Persistent interstitial septal thickening and small right, trace left pleural effusions, consistent with edema and suspicious for a component of lymphangitic metastasis. 4. Interval development of scattered, predominantly peripheral and subpleural ground-glass opacities, which are generally consistent with reported history of recent COVID-19 infection, although also similar in appearance and morphology to ground-glass opacities seen on prior examinations. Differential considerations further include drug toxicity given ongoing chemotherapy. 5. Redemonstrated postoperative findings of right breast lumpectomy. 6. No evidence of metastatic disease within the abdomen or pelvis. 7.  Aortic Atherosclerosis (ICD10-I70.0). Electronically Signed   By: Eddie Candle M.D.   On: 07/09/2019 10:46   DG Chest Portable 1 View  Result Date: 08/05/2019 CLINICAL DATA:  59 year old female with hemoptysis. History of breast cancer. EXAM: PORTABLE CHEST 1 VIEW COMPARISON:  Chest radiograph dated 04/09/2017. CT dated 07/09/2019. FINDINGS: Port-A-Cath with tip in the region of the cavoatrial junction. Right hilar density corresponding to the post treatment/post radiation fibrosis seen on the prior CT. Diffuse interstitial and vascular prominence with scattered faint reticulonodular densities throughout the lungs. Probable small bilateral pleural effusions. No pneumothorax. The cardiac silhouette is within normal limits. Atherosclerotic calcification of the aorta. No acute osseous pathology. IMPRESSION: Right hilar post radiation fibrosis and scattered bilateral reticulonodular densities and small bilateral pleural effusions. Overall findings are similar to the CT of 07/09/2019. Electronically Signed   By: Anner Crete M.D.   On: 08/05/2019 22:59      Labs: BNP (last 3 results) Recent Labs    08/05/19 2230  BNP 123XX123*   Basic Metabolic Panel: Recent Labs  Lab 08/05/19 2230 08/07/19 0601  NA  142 141  K 4.0 3.9  CL 105 107  CO2 25 25  GLUCOSE 116* 93  BUN 18 19  CREATININE 0.58 0.55  CALCIUM 9.1 9.0  MG  --  2.1   Liver Function Tests: Recent Labs  Lab 08/05/19 2230  AST 21  ALT 20  ALKPHOS 140*  BILITOT 0.5  PROT 7.0  ALBUMIN 3.9   No results for input(s): LIPASE, AMYLASE in the last 168 hours. No results for  input(s): AMMONIA in the last 168 hours. CBC: Recent Labs  Lab 08/05/19 2230 08/07/19 0601  WBC 10.7* 9.4  NEUTROABS 8.8*  --   HGB 11.0* 9.7*  HCT 35.2* 31.7*  MCV 94.4 94.6  PLT 263 289   Cardiac Enzymes: No results for input(s): CKTOTAL, CKMB, CKMBINDEX, TROPONINI in the last 168 hours. BNP: Invalid input(s): POCBNP CBG: No results for input(s): GLUCAP in the last 168 hours. D-Dimer No results for input(s): DDIMER in the last 72 hours. Hgb A1c Recent Labs    08/06/19 0548  HGBA1C 5.4   Lipid Profile Recent Labs    08/06/19 0038 08/06/19 0548  CHOL  --  195  HDL  --  54  LDLCALC  --  131*  TRIG 84 51  CHOLHDL  --  3.6   Thyroid function studies No results for input(s): TSH, T4TOTAL, T3FREE, THYROIDAB in the last 72 hours.  Invalid input(s): FREET3 Anemia work up [employer]    08/05/19 2230  FERRITIN 216   Urinalysis No results found for: COLORURINE, APPEARANCEUR, [place], [place], [place], [place], [place], [place], [place], UROBILINOGEN, NITRITE, LEUKOCYTESUR Sepsis Labs Invalid input(s): PROCALCITONIN,  WBC,  LACTICIDVEN Microbiology Recent Results (from the past 240 hour(s))  Blood Culture (routine x 2)     Status: None (Preliminary result)   Collection Time: 08/06/19  1:00 AM   Specimen: BLOOD  Result Value Ref Range Status   Specimen Description BLOOD RIGHT HAND  Final   Special Requests   Final    BOTTLES DRAWN AEROBIC AND ANAEROBIC Blood Culture results may not be optimal due to an inadequate volume of blood received in culture bottles   Culture   Final    NO GROWTH 1 DAY Performed at [facility], [address] [place], [place] [address]    Report Status PENDING  Incomplete  Blood Culture (routine x 2)     Status: None (Preliminary result)   Collection Time: 08/06/19  1:01 AM   Specimen: BLOOD  Result Value Ref Range Status   Specimen Description BLOOD RIGHT ARM  Final   Special Requests   Final    BOTTLES DRAWN AEROBIC AND ANAEROBIC Blood Culture results may not be optimal due to an inadequate volume of blood received in culture bottles   Culture   Final    NO GROWTH 1 DAY Performed at [facility], [address], [place], [place] [address]    Report Status PENDING  Incomplete     Total time spend on discharging this patient, including the last patient exam, discussing the hospital stay, instructions for ongoing care as it relates to all pertinent caregivers, as well as preparing the medical discharge records, prescriptions, and/or referrals as applicable, is 40 minutes.    Enzo Bi, MD  [facility] 08/07/2019, 2:31 PM  If 7PM-7AM, please contact night-coverage

## 2019-08-11 ENCOUNTER — Inpatient Hospital Stay: Payer: Managed Care, Other (non HMO)

## 2019-08-11 ENCOUNTER — Encounter: Payer: Self-pay | Admitting: Oncology

## 2019-08-11 ENCOUNTER — Other Ambulatory Visit: Payer: Self-pay

## 2019-08-11 ENCOUNTER — Inpatient Hospital Stay: Payer: Managed Care, Other (non HMO) | Attending: Oncology | Admitting: Oncology

## 2019-08-11 VITALS — BP 117/76 | HR 99 | Temp 97.0°F | Wt 203.0 lb

## 2019-08-11 DIAGNOSIS — J9621 Acute and chronic respiratory failure with hypoxia: Secondary | ICD-10-CM

## 2019-08-11 DIAGNOSIS — C50411 Malignant neoplasm of upper-outer quadrant of right female breast: Secondary | ICD-10-CM | POA: Diagnosis not present

## 2019-08-11 DIAGNOSIS — Z171 Estrogen receptor negative status [ER-]: Secondary | ICD-10-CM

## 2019-08-11 DIAGNOSIS — J9690 Respiratory failure, unspecified, unspecified whether with hypoxia or hypercapnia: Secondary | ICD-10-CM | POA: Diagnosis not present

## 2019-08-11 LAB — CULTURE, BLOOD (ROUTINE X 2)
Culture: NO GROWTH
Culture: NO GROWTH

## 2019-08-11 MED ORDER — PREDNISONE 20 MG PO TABS: 40.0000 mg | ORAL_TABLET | Freq: Every day | ORAL | 0 refills | Status: DC

## 2019-08-13 ENCOUNTER — Other Ambulatory Visit: Payer: Self-pay

## 2019-08-13 ENCOUNTER — Emergency Department: Payer: Managed Care, Other (non HMO)

## 2019-08-13 ENCOUNTER — Encounter: Payer: Self-pay | Admitting: Emergency Medicine

## 2019-08-13 ENCOUNTER — Inpatient Hospital Stay
Admission: EM | Admit: 2019-08-13 | Discharge: 2019-08-16 | DRG: 189 | Disposition: A | Discharge status: Home / Self Care | Payer: Managed Care, Other (non HMO) | Attending: Internal Medicine | Admitting: Internal Medicine

## 2019-08-13 DIAGNOSIS — E669 Obesity, unspecified: Secondary | ICD-10-CM | POA: Diagnosis not present

## 2019-08-13 DIAGNOSIS — T380X5A Adverse effect of glucocorticoids and synthetic analogues, initial encounter: Secondary | ICD-10-CM | POA: Diagnosis not present

## 2019-08-13 DIAGNOSIS — Z20822 Contact with and (suspected) exposure to covid-19: Secondary | ICD-10-CM | POA: Diagnosis present

## 2019-08-13 DIAGNOSIS — Z171 Estrogen receptor negative status [ER-]: Secondary | ICD-10-CM | POA: Diagnosis not present

## 2019-08-13 DIAGNOSIS — Z515 Encounter for palliative care: Secondary | ICD-10-CM | POA: Diagnosis not present

## 2019-08-13 DIAGNOSIS — R0682 Tachypnea, not elsewhere classified: Secondary | ICD-10-CM | POA: Diagnosis not present

## 2019-08-13 DIAGNOSIS — E872 Acidosis: Secondary | ICD-10-CM | POA: Diagnosis present

## 2019-08-13 DIAGNOSIS — C50411 Malignant neoplasm of upper-outer quadrant of right female breast: Secondary | ICD-10-CM | POA: Diagnosis present

## 2019-08-13 DIAGNOSIS — Z9221 Personal history of antineoplastic chemotherapy: Secondary | ICD-10-CM | POA: Diagnosis not present

## 2019-08-13 DIAGNOSIS — J9690 Respiratory failure, unspecified, unspecified whether with hypoxia or hypercapnia: Secondary | ICD-10-CM | POA: Diagnosis present

## 2019-08-13 DIAGNOSIS — E785 Hyperlipidemia, unspecified: Secondary | ICD-10-CM | POA: Diagnosis present

## 2019-08-13 DIAGNOSIS — R7303 Prediabetes: Secondary | ICD-10-CM | POA: Diagnosis present

## 2019-08-13 DIAGNOSIS — J810 Acute pulmonary edema: Secondary | ICD-10-CM | POA: Diagnosis present

## 2019-08-13 DIAGNOSIS — I248 Other forms of acute ischemic heart disease: Secondary | ICD-10-CM | POA: Diagnosis present

## 2019-08-13 DIAGNOSIS — J189 Pneumonia, unspecified organism: Secondary | ICD-10-CM

## 2019-08-13 DIAGNOSIS — J9601 Acute respiratory failure with hypoxia: Secondary | ICD-10-CM

## 2019-08-13 DIAGNOSIS — J9621 Acute and chronic respiratory failure with hypoxia: Secondary | ICD-10-CM | POA: Diagnosis present

## 2019-08-13 DIAGNOSIS — J9600 Acute respiratory failure, unspecified whether with hypoxia or hypercapnia: Secondary | ICD-10-CM | POA: Diagnosis not present

## 2019-08-13 DIAGNOSIS — C7800 Secondary malignant neoplasm of unspecified lung: Secondary | ICD-10-CM | POA: Diagnosis present

## 2019-08-13 DIAGNOSIS — Z8616 Personal history of COVID-19: Secondary | ICD-10-CM | POA: Diagnosis not present

## 2019-08-13 DIAGNOSIS — D72829 Elevated white blood cell count, unspecified: Secondary | ICD-10-CM | POA: Diagnosis not present

## 2019-08-13 DIAGNOSIS — Z8701 Personal history of pneumonia (recurrent): Secondary | ICD-10-CM

## 2019-08-13 DIAGNOSIS — Z79899 Other long term (current) drug therapy: Secondary | ICD-10-CM

## 2019-08-13 DIAGNOSIS — Z9981 Dependence on supplemental oxygen: Secondary | ICD-10-CM | POA: Diagnosis not present

## 2019-08-13 DIAGNOSIS — J449 Chronic obstructive pulmonary disease, unspecified: Secondary | ICD-10-CM | POA: Diagnosis not present

## 2019-08-13 DIAGNOSIS — Z7189 Other specified counseling: Secondary | ICD-10-CM | POA: Diagnosis not present

## 2019-08-13 DIAGNOSIS — R0602 Shortness of breath: Secondary | ICD-10-CM | POA: Diagnosis not present

## 2019-08-13 DIAGNOSIS — C50511 Malignant neoplasm of lower-outer quadrant of right female breast: Secondary | ICD-10-CM | POA: Diagnosis not present

## 2019-08-13 DIAGNOSIS — J8410 Pulmonary fibrosis, unspecified: Secondary | ICD-10-CM | POA: Diagnosis present

## 2019-08-13 DIAGNOSIS — Z7951 Long term (current) use of inhaled steroids: Secondary | ICD-10-CM | POA: Diagnosis not present

## 2019-08-13 DIAGNOSIS — Z7952 Long term (current) use of systemic steroids: Secondary | ICD-10-CM | POA: Diagnosis not present

## 2019-08-13 DIAGNOSIS — Z880 Allergy status to penicillin: Secondary | ICD-10-CM

## 2019-08-13 DIAGNOSIS — C7802 Secondary malignant neoplasm of left lung: Secondary | ICD-10-CM | POA: Diagnosis not present

## 2019-08-13 DIAGNOSIS — R778 Other specified abnormalities of plasma proteins: Secondary | ICD-10-CM | POA: Diagnosis not present

## 2019-08-13 DIAGNOSIS — Z923 Personal history of irradiation: Secondary | ICD-10-CM | POA: Diagnosis not present

## 2019-08-13 DIAGNOSIS — J441 Chronic obstructive pulmonary disease with (acute) exacerbation: Secondary | ICD-10-CM | POA: Diagnosis not present

## 2019-08-13 DIAGNOSIS — Y92230 Patient room in hospital as the place of occurrence of the external cause: Secondary | ICD-10-CM | POA: Diagnosis not present

## 2019-08-13 DIAGNOSIS — C771 Secondary and unspecified malignant neoplasm of intrathoracic lymph nodes: Secondary | ICD-10-CM | POA: Diagnosis not present

## 2019-08-13 DIAGNOSIS — R5383 Other fatigue: Secondary | ICD-10-CM | POA: Diagnosis not present

## 2019-08-13 DIAGNOSIS — Z803 Family history of malignant neoplasm of breast: Secondary | ICD-10-CM

## 2019-08-13 DIAGNOSIS — C7801 Secondary malignant neoplasm of right lung: Secondary | ICD-10-CM | POA: Diagnosis not present

## 2019-08-13 LAB — CBC WITH DIFFERENTIAL/PLATELET
Abs Immature Granulocytes: 0.16 10*3/uL — ABNORMAL HIGH (ref 0.00–0.07)
Basophils Absolute: 0.1 10*3/uL (ref 0.0–0.1)
Basophils Relative: 0 %
Eosinophils Absolute: 0 10*3/uL (ref 0.0–0.5)
Eosinophils Relative: 0 %
HCT: 41.4 % (ref 36.0–46.0)
Hemoglobin: 12.9 g/dL (ref 12.0–15.0)
Immature Granulocytes: 1 %
Lymphocytes Relative: 5 %
Lymphs Abs: 0.9 10*3/uL (ref 0.7–4.0)
MCH: 28.7 pg (ref 26.0–34.0)
MCHC: 31.2 g/dL (ref 30.0–36.0)
MCV: 92.2 fL (ref 80.0–100.0)
Monocytes Absolute: 0.3 10*3/uL (ref 0.1–1.0)
Monocytes Relative: 2 %
Neutro Abs: 15.7 10*3/uL — ABNORMAL HIGH (ref 1.7–7.7)
Neutrophils Relative %: 92 %
Platelets: 307 10*3/uL (ref 150–400)
RBC: 4.49 MIL/uL (ref 3.87–5.11)
RDW: 16.2 % — ABNORMAL HIGH (ref 11.5–15.5)
WBC: 17.1 10*3/uL — ABNORMAL HIGH (ref 4.0–10.5)
nRBC: 0 % (ref 0.0–0.2)

## 2019-08-13 LAB — COMPREHENSIVE METABOLIC PANEL
ALT: 39 U/L (ref 0–44)
AST: 30 U/L (ref 15–41)
Albumin: 3.9 g/dL (ref 3.5–5.0)
Alkaline Phosphatase: 111 U/L (ref 38–126)
Anion gap: 13 (ref 5–15)
BUN: 26 mg/dL — ABNORMAL HIGH (ref 6–20)
CO2: 20 mmol/L — ABNORMAL LOW (ref 22–32)
Calcium: 9.6 mg/dL (ref 8.9–10.3)
Chloride: 105 mmol/L (ref 98–111)
Creatinine, Ser: 0.71 mg/dL (ref 0.44–1.00)
GFR calc Af Amer: >60 mL/min (ref >60)
GFR calc non Af Amer: >60 mL/min (ref >60)
Glucose, Bld: 152 mg/dL — ABNORMAL HIGH (ref 70–99)
Potassium: 4.3 mmol/L (ref 3.5–5.1)
Sodium: 138 mmol/L (ref 135–145)
Total Bilirubin: 0.6 mg/dL (ref 0.3–1.2)
Total Protein: 6.9 g/dL (ref 6.5–8.1)

## 2019-08-13 LAB — BRAIN NATRIURETIC PEPTIDE: B Natriuretic Peptide: 602 pg/mL — ABNORMAL HIGH (ref 0.0–100.0)

## 2019-08-13 LAB — BLOOD GAS, VENOUS
Acid-base deficit: 1.7 mmol/L (ref 0.0–2.0)
Bicarbonate: 23 mmol/L (ref 20.0–28.0)
Delivery systems: POSITIVE
FIO2: 0.5
O2 Saturation: 98.7 %
PEEP: 8 cmH2O
PIP: 15 cmH2O
Patient temperature: 37
RATE: 8 resp/min
pCO2, Ven: 38 mmHg — ABNORMAL LOW (ref 44.0–60.0)
pH, Ven: 7.39 (ref 7.250–7.430)
pO2, Ven: 123 mmHg — ABNORMAL HIGH (ref 32.0–45.0)

## 2019-08-13 LAB — GLUCOSE, CAPILLARY: Glucose-Capillary: 137 mg/dL — ABNORMAL HIGH (ref 70–99)

## 2019-08-13 LAB — LACTIC ACID, PLASMA
Lactic Acid, Venous: 2.1 mmol/L (ref 0.5–1.9)
Lactic Acid, Venous: 2 mmol/L (ref 0.5–1.9)

## 2019-08-13 LAB — TROPONIN I (HIGH SENSITIVITY)
Troponin I (High Sensitivity): 226 ng/L (ref <18)
Troponin I (High Sensitivity): 277 ng/L (ref <18)
Troponin I (High Sensitivity): 264 ng/L (ref <18)

## 2019-08-13 LAB — PROCALCITONIN: Procalcitonin: <0.1 ng/mL

## 2019-08-13 MED ORDER — ACETAMINOPHEN 650 MG RE SUPP: 650.0000 mg | Freq: Four times a day (QID) | RECTAL | Status: DC | PRN

## 2019-08-13 MED ORDER — BUDESONIDE 0.25 MG/2ML IN SUSP
0.2500 mg | Freq: Every day | RESPIRATORY_TRACT | Status: DC
  Administered 2019-08-14: 0.25 mg via RESPIRATORY_TRACT
  Filled 2019-08-13: qty 2

## 2019-08-13 MED ORDER — SODIUM CHLORIDE 0.9% FLUSH
3.0000 mL | Freq: Two times a day (BID) | INTRAVENOUS | Status: DC
  Administered 2019-08-13 – 2019-08-16 (×6): 3 mL via INTRAVENOUS

## 2019-08-13 MED ORDER — IPRATROPIUM-ALBUTEROL 0.5-2.5 (3) MG/3ML IN SOLN: 9.0000 mL | Freq: Once | RESPIRATORY_TRACT | Status: AC

## 2019-08-13 MED ORDER — FERROUS SULFATE 325 (65 FE) MG PO TABS
325.0000 mg | ORAL_TABLET | Freq: Every day | ORAL | Status: DC
  Administered 2019-08-14 – 2019-08-15 (×2): 325 mg via ORAL
  Filled 2019-08-13 (×3): qty 1

## 2019-08-13 MED ORDER — METHYLPREDNISOLONE SODIUM SUCC 125 MG IJ SOLR: 125.0000 mg | Freq: Once | INTRAMUSCULAR | Status: DC

## 2019-08-13 MED ORDER — LEVALBUTEROL HCL 1.25 MG/0.5ML IN NEBU: 1.2500 mg | INHALATION_SOLUTION | Freq: Four times a day (QID) | RESPIRATORY_TRACT | Status: DC | PRN

## 2019-08-13 MED ORDER — IPRATROPIUM-ALBUTEROL 0.5-2.5 (3) MG/3ML IN SOLN
RESPIRATORY_TRACT | Status: AC
  Filled 2019-08-13: qty 9

## 2019-08-13 MED ORDER — METHYLPREDNISOLONE SODIUM SUCC 40 MG IJ SOLR
40.0000 mg | Freq: Two times a day (BID) | INTRAMUSCULAR | Status: DC
  Administered 2019-08-14: 40 mg via INTRAVENOUS
  Filled 2019-08-13: qty 1

## 2019-08-13 MED ORDER — ONDANSETRON HCL 4 MG PO TABS: 4.0000 mg | ORAL_TABLET | Freq: Four times a day (QID) | ORAL | Status: DC | PRN

## 2019-08-13 MED ORDER — CHLORHEXIDINE GLUCONATE CLOTH 2 % EX PADS
6.0000 | MEDICATED_PAD | Freq: Every day | CUTANEOUS | Status: DC
  Administered 2019-08-13 – 2019-08-15 (×3): 6 via TOPICAL

## 2019-08-13 MED ORDER — IPRATROPIUM BROMIDE 0.02 % IN SOLN: 0.5000 mg | Freq: Four times a day (QID) | RESPIRATORY_TRACT | Status: DC | PRN

## 2019-08-13 MED ORDER — ACETAMINOPHEN 325 MG PO TABS: 650.0000 mg | ORAL_TABLET | Freq: Four times a day (QID) | ORAL | Status: DC | PRN

## 2019-08-13 MED ORDER — HYDROCODONE-ACETAMINOPHEN 5-325 MG PO TABS: 1.0000 | ORAL_TABLET | Freq: Four times a day (QID) | ORAL | Status: DC | PRN

## 2019-08-13 MED ORDER — VITAMIN D 25 MCG (1000 UNIT) PO TABS
1000.0000 [IU] | ORAL_TABLET | Freq: Every day | ORAL | Status: DC
  Administered 2019-08-14 – 2019-08-15 (×2): 1000 [IU] via ORAL
  Filled 2019-08-13 (×3): qty 1

## 2019-08-13 MED ORDER — FUROSEMIDE 10 MG/ML IJ SOLN: 40.0000 mg | Freq: Once | INTRAMUSCULAR | Status: DC

## 2019-08-13 MED ORDER — POLYETHYLENE GLYCOL 3350 17 G PO PACK: 17.0000 g | PACK | Freq: Every day | ORAL | Status: DC | PRN

## 2019-08-13 MED ORDER — FUROSEMIDE 10 MG/ML IJ SOLN
40.0000 mg | Freq: Every day | INTRAMUSCULAR | Status: DC
  Administered 2019-08-13: 40 mg via INTRAVENOUS
  Filled 2019-08-13: qty 4

## 2019-08-13 MED ORDER — FLUTICASONE FUROATE 100 MCG/ACT IN AEPB: 1.0000 | INHALATION_SPRAY | Freq: Every day | RESPIRATORY_TRACT | Status: DC

## 2019-08-13 MED ORDER — PANTOPRAZOLE SODIUM 20 MG PO TBEC
20.0000 mg | DELAYED_RELEASE_TABLET | Freq: Every day | ORAL | Status: DC
  Administered 2019-08-14: 20 mg via ORAL
  Filled 2019-08-13: qty 1

## 2019-08-13 MED ORDER — ONDANSETRON HCL 4 MG/2ML IJ SOLN: 4.0000 mg | Freq: Four times a day (QID) | INTRAMUSCULAR | Status: DC | PRN

## 2019-08-13 MED ORDER — GUAIFENESIN-DM 100-10 MG/5ML PO SYRP
15.0000 mL | ORAL_SOLUTION | ORAL | Status: DC | PRN
  Administered 2019-08-15 – 2019-08-16 (×2): 15 mL via ORAL
  Filled 2019-08-13 (×3): qty 15

## 2019-08-13 MED ORDER — ENOXAPARIN SODIUM 40 MG/0.4ML ~~LOC~~ SOLN
40.0000 mg | SUBCUTANEOUS | Status: DC
  Administered 2019-08-13 – 2019-08-15 (×3): 40 mg via SUBCUTANEOUS
  Filled 2019-08-13 (×3): qty 0.4

## 2019-08-13 NOTE — H&P (Addendum)
History and Physical    Ashley Molina [id number] DOB: [birth year] DOA: 08/13/2019  PCP: Patient, No Pcp Per   Patient coming from: Home  I have personally briefly reviewed patient's old medical records in [place]  Chief Complaint: Worsening shortness of breath  HPI: Ashley Molina is a 59 y.o. female with medical history significant of [name] a58 y.o.Caucasian femalewith a known history of COVID-19 05/12/2019, [name] , and right breast cancer with lung metastasis s/p lumpectomy, chemotherapy and radiotherapy, who presented to the emergency room with acute onset of worsening dyspnea. Recently admitted from 3/2 bottles 08/07/2019 with worsening shortness of breath and hemoptysis.  CT chest with disease progression.  She was sent home with continuous oxygen along with steroid and low-dose Lasix.. Patient was not using Lasix at home  due to increased urination.  There was some concern for pneumonitis secondary to Covid and gemcitabine.  She was supposed to follow-up with pulmonary as an outpatient.  Recently prednisone dose was increased to 40 mg daily until she sees her pulmonologist on 08/26/2019.  Since discharge patient continued to experience worsening shortness of breath.  She tried increasing her oxygen but her symptoms continued to get worse.  She continued to experience cough, no recent hemoptysis.  No fever or chills.  No chest pain.  No recent sick contacts.  Her appetite is low.  She did had some diarrhea after taking antibiotics which has been resolved now.  No nausea vomiting.  No urinary symptoms.  She was not taking her Lasix as she has to walk about 40 steps in order to reach restroom.  Feeling worsening generalized weakness.   ED Course: On presentation she was tachycardic, tachypneic and  hypoxic to 85% on 4 L.  Labs positive for leukocytosis, mildly elevated BNP at 602, troponin at 226 and chest x-ray concerning  for worsening pulmonary parenchymal disease and a possible pulmonary edema.  She was treated with IV steroid, nebulizer and was placed on BiPAP with improvement in her respiratory status.  Review of Systems: As per HPI otherwise 10 point review of systems negative.   Past Medical History:  Diagnosis Date  . Anemia   . Breast cancer ([place])   . Breast cancer, right ([place]) 04/2017   Hx Lumpectomy, Chemo + Rad tx's.  . Breast cyst, right    aspirated by Dr. Bary Castilla  . COVID-19   . Hyperlipidemia   . Personal history of chemotherapy   . Pre-diabetes     Past Surgical History:  Procedure Laterality Date  . AXILLARY LYMPH NODE BIOPSY Right 04/09/2017   Procedure: AXILLARY LYMPH NODE BIOPSY;  Surgeon: Robert Bellow, MD;  Location: [facility] ORS;  Service: General;  Laterality: Right;  . BREAST BIOPSY Right 03/27/2017   US guided breast mass - invasive mammary carcinoma  . BREAST BIOPSY Right 03/27/2017   Lymph node - metastatic carcinoma  . BREAST BIOPSY Right 04/09/2017   Lymph node   . BREAST CYST ASPIRATION Right 11/2009   Dr. Bary Castilla did FNA  . BREAST EXCISIONAL BIOPSY Right 09/28/2017   lumpectomy and 12 lympnode rad neo adj chemo  . COLONOSCOPY  2011  . DILATION AND CURETTAGE OF UTERUS     X3  . ENDOBRONCHIAL ULTRASOUND N/A 04/09/2017   Procedure: ENDOBRONCHIAL ULTRASOUND;  Surgeon: Laverle Hobby, MD;  Location: [facility] ORS;  Service: Pulmonary;  Laterality: N/A;  . ENDOMETRIAL ABLATION    . ENDOMETRIAL BIOPSY  09/2009  . PORTACATH PLACEMENT  Left 04/09/2017   Procedure: INSERTION PORT-A-CATH;  Surgeon: Robert Bellow, MD;  Location: [facility] ORS;  Service: General;  Laterality: Left;     reports that she has never smoked. She has never used smokeless tobacco. She reports previous alcohol use. She reports that she does not use drugs.  Allergies  Allergen Reactions  . Penicillins Rash    Family History  Problem Relation Age of Onset  . Melanoma Maternal Grandmother 72         currently 48  . Brain cancer Maternal Grandfather 41       unk. type; deceased in 40s  . Melanoma Other 52       mat grandmother's father  . Melanoma Father 68       on head; currently 88  . Prostate cancer Maternal Uncle        3 maternal uncles; dx in 43s  . Breast cancer Other        mat grandfather's sister; dx 19s    Prior to Admission medications   Medication Sig Start Date End Date Taking? Authorizing Provider  Biotin 2500 MCG CAPS Take 5,000 mcg/day by mouth at bedtime.    Yes [provider]  Cholecalciferol (VITAMIN D3) 10000 units TABS Take 1 tablet by mouth at bedtime.    Yes [provider]  Ferrous Sulfate (IRON) 325 (65 Fe) MG TABS Take 1 tablet by mouth at bedtime.    Yes [provider]  Fluticasone Furoate (ARNUITY ELLIPTA) 100 MCG/ACT AEPB Inhale 1 puff into the lungs daily. 06/11/19  Yes Tyler Pita, MD  Multiple Vitamins-Minerals (CENTRUM SILVER ADULT 50+) TABS Take 1 tablet by mouth at bedtime.    Yes [provider]  Omega-3 Fatty Acids (FISH OIL) 1000 MG CAPS Take 1 capsule by mouth at bedtime.    Yes [provider]  pantoprazole (PROTONIX) 20 MG tablet Take 1 tablet (20 mg total) by mouth daily. 06/20/19  Yes Sindy Guadeloupe, MD  predniSONE (DELTASONE) 20 MG tablet Take 2 tablets (40 mg total) by mouth daily with breakfast. 08/11/19  Yes Sindy Guadeloupe, MD  sulfamethoxazole-trimethoprim (BACTRIM DS) 800-160 MG tablet 1 tablet three times per week. 07/28/19  Yes Tyler Pita, MD  furosemide (LASIX) 20 MG tablet Take 1 tablet (20 mg total) by mouth every other day. Diuretic, water pill. 08/07/19 09/06/19  Enzo Bi, MD  prochlorperazine (COMPAZINE) 10 MG tablet Take 1 tablet (10 mg total) by mouth every 6 (six) hours as needed (Nausea or vomiting). 04/05/17 09/05/18  Sindy Guadeloupe, MD    Physical Exam: Vitals:   08/13/19 1600 08/13/19 1630 08/13/19 1700 08/13/19 1800  BP: (!) 96/56 (!) 110/56 93/70 113/62   Pulse: (!) 121 (!) 115 (!) 56 78  Resp: (!) 28 (!) 22 17 20   Temp:      TempSrc:      SpO2: 100% 100% 100% 98%  Weight:      Height:        General: Vital signs reviewed.  Chronically ill-appearing lady, appears anxious with BiPAP and cooperative with exam.  Head: Normocephalic and atraumatic. Eyes: EOMI, conjunctivae normal, no scleral icterus.  ENMT: Mucous membranes are moist.  Neck: Supple, trachea midline, normal ROM, no JVD, masses, thyromegaly, or carotid bruit present.  Cardiovascular: Sinus tachycardia, no murmurs, gallops, or rubs. Pulmonary/Chest: Bilateral scattered crackles with intermittent wheeze. Abdominal: Soft, non-tender, non-distended, BS +,  Extremities: 2+  lower extremity edema bilaterally,  pulses symmetric and intact  bilaterally.  Neurological: A&O x3, Strength is normal and symmetric bilaterally, cranial nerve II-XII are grossly intact, no focal motor deficit, sensory intact to light touch bilaterally.  Skin: Warm, dry and intact. No rashes or erythema. Psychiatric: Normal mood and affect.   Labs on Admission: I have personally reviewed following labs and imaging studies  CBC: Recent Labs  Lab 08/07/19 0601 08/13/19 1502  WBC 9.4 17.1*  NEUTROABS  --  15.7*  HGB 9.7* 12.9  HCT 31.7* 41.4  MCV 94.6 92.2  PLT 289 [id number]   Basic Metabolic Panel: Recent Labs  Lab 08/07/19 0601 08/13/19 1502  NA 141 138  K 3.9 4.3  CL 107 105  CO2 25 20*  GLUCOSE 93 152*  BUN 19 26*  CREATININE 0.55 0.71  CALCIUM 9.0 9.6  MG 2.1  --    GFR: Estimated Creatinine Clearance: 81.1 mL/min (by C-G formula based on SCr of 0.71 mg/dL). Liver Function Tests: Recent Labs  Lab 08/13/19 1502  AST 30  ALT 39  ALKPHOS 111  BILITOT 0.6  PROT 6.9  ALBUMIN 3.9   No results for input(s): LIPASE, AMYLASE in the last 168 hours. No results for input(s): AMMONIA in the last 168 hours. Coagulation Profile: No results for input(s): INR, PROTIME in the last 168  hours. Cardiac Enzymes: No results for input(s): CKTOTAL, CKMB, CKMBINDEX, TROPONINI in the last 168 hours. BNP (last 3 results) No results for input(s): PROBNP in the last 8760 hours. HbA1C: No results for input(s): HGBA1C in the last 72 hours. CBG: No results for input(s): GLUCAP in the last 168 hours. Lipid Profile: No results for input(s): CHOL, HDL, LDLCALC, TRIG, CHOLHDL, LDLDIRECT in the last 72 hours. Thyroid Function Tests: No results for input(s): TSH, T4TOTAL, FREET4, T3FREE, THYROIDAB in the last 72 hours. Anemia Panel: No results for input(s): VITAMINB12, FOLATE, FERRITIN, TIBC, IRON, RETICCTPCT in the last 72 hours. Urine analysis: No results found for: COLORURINE, APPEARANCEUR, LABSPEC, [place], GLUCOSEU, HGBUR, BILIRUBINUR, KETONESUR, PROTEINUR, UROBILINOGEN, NITRITE, LEUKOCYTESUR  Radiological Exams on Admission: DG Chest Port 1 View  Result Date: 08/13/2019 CLINICAL DATA:  Shortness of breath, tachypnea with decreased oxygen saturations., history of breast cancer. EXAM: PORTABLE CHEST 1 VIEW COMPARISON:  08/06/2019, CT of the chest. FINDINGS: Masslike appearance of right hilum similar to previous exam. Cardiomediastinal contours are stable. Left Port-A-Cath terminates at the caval to atrial junction. Nodular opacities are again suggested and there is increase in interstitial prominence when compared to the chest x-ray from 08/05/2019. Blunting of right costophrenic angles suggest scarring or small effusion. No signs of dense consolidation aside from perihilar changes discussed above. Visualized skeletal structures without acute bone finding. IMPRESSION: Masslike appearance of right hilum similar to previous exam. Nodular opacities are again suggested and there is increase in interstitial prominence when compared to the chest x-ray from 08/05/2019. Findings may represent worsening of pulmonary parenchymal disease, associated with previously reported COVID-19 infection,  particularly in the left lung base, potentially with a background of pulmonary edema. Electronically Signed   By: Zetta Bills M.D.   On: 08/13/2019 15:26    EKG: Independently reviewed.  Sinus tachycardia with baseline wander.  No significant ST changes.  Assessment/Plan Active Problems:   Respiratory failure (HCC)   Acute on chronic hypoxic respiratory failure.  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  She was also on gemcitabine which can also contribute to pneumonitis according to her pulmonologist. She  also appears volume up. Currently saturating well on BiPAP. -Admit to stepdown. -Continue with BiPAP-we will try to wean her off. -Keep her n.p.o. until she is weaned off from BiPAP. -Give her Solu-Medrol 40 mg twice daily. -Lasix 40 mg daily. -Pulmonary consult.  Elevated troponin.  Most likely secondary to demand.  She also appears volume up.  Echocardiogram done in 11/18/ 2020 was with normal EF.  EKG without any significant ST changes. Elevated BNP at 608. -Trend troponin -Repeat echocardiogram. -Lasix 40 mg IV daily. -Monitor BMP -Strict intake and output. -Daily weight.  Leukocytosis.  She was given Solu-Medrol.  High risk for pneumonia.  She was afebrile. -Check procalcitonin. -Pending blood cultures results.  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  Patient with disease progression and recurrence of pulmonary mets. There was some concern about worsening pneumonitis due to gemcitabine.  Recently saw her oncologist Dr. Astrid Divine on 08/11/2019.  She does not think that gemcitabine is causing her pneumonitis.  She is also running out of her options. -Palliative consult to discuss goals of care and symptom management.  Patient does not want to give up.  DVT prophylaxis: Lovenox Code Status: Full code Discussed with patient. Family Communication:    Disposition Plan: Pending improvement, wants to go back home. Consults called: Pulmonary and palliative. Admission status: Inpatient   Lorella Nimrod MD [facility]  If 7PM-7AM, please contact night-coverage [email or web address]  08/13/2019, 6:08 PM   This record has been created using Dragon voice recognition software. Errors have been sought and corrected,but may not always be located. Such creation errors do not reflect on the standard of care.

## 2019-08-13 NOTE — ED Triage Notes (Signed)
Patient presents to the ED with shortness of breath.  Patient tachypnic and sats 85% on room air when fire dept. Arrived.  Patient used inhalers at home with no improvement.  Patient has history of breast cancer and pneumonitis.  Patient was given 125mg  of solumedrol IV by EMS and 1 duoneb treatment.  Patient was placed on bipap by EMS which improved sats and work of breathing.

## 2019-08-13 NOTE — ED Provider Notes (Signed)
[facility] Emergency Department Provider Note   ____________________________________________    I have reviewed the triage vital signs and the nursing notes.   HISTORY  Chief Complaint Shortness of Breath     HPI Ashley Molina is a 59 y.o. female presents with complaints of shortness of breath.  She also reports offices.  EMS found patient to have oxygen saturations of 84% on 4 L nasal cannula at home.  Patient has been on 2 L nasal cannula at home is a chronic respiratory disease, did have Covid in December and has had worsening dyspnea on exertion since then, has been worsening over last several days and has been increasing oxygen use.Marland Kitchen  History of breast cancer with radiation and chemotherapy.  Recent admission to the hospital per medical records discharged on 4 March, similar presentation during that admission with shortness of breath and hemoptysis.  Past Medical History:  Diagnosis Date  . Anemia   . Breast cancer ([place])   . Breast cancer, right ([place]) 04/2017   Hx Lumpectomy, Chemo + Rad tx's.  . Breast cyst, right    aspirated by Dr. Bary Castilla  . COVID-19   . Hyperlipidemia   . Personal history of chemotherapy   . Pre-diabetes     Patient Active Problem List   Diagnosis Date Noted  . COVID-19 08/06/2019  . Pneumonitis 01/20/2019  . Goals of care, counseling/discussion 10/08/2018  . Chemotherapy induced neutropenia ([place]) 05/03/2017  . Carcinoma of right breast metastatic to lung ([place]) 04/15/2017  . Malignant neoplasm of upper-outer quadrant of right breast in female, estrogen receptor negative ([place]) 04/05/2017  . Anemia 03/14/2017  . Solitary cyst of breast, right 03/14/2017  . Pre-diabetes 03/14/2017  . Hypercholesterolemia 03/14/2017  . Mass of upper outer quadrant of right breast 03/14/2017    Past Surgical History:  Procedure Laterality Date  . AXILLARY LYMPH NODE BIOPSY Right 04/09/2017   Procedure: AXILLARY LYMPH NODE  BIOPSY;  Surgeon: Shiryl Ruddy Bellow, MD;  Location: [facility] ORS;  Service: General;  Laterality: Right;  . BREAST BIOPSY Right 03/27/2017   US guided breast mass - invasive mammary carcinoma  . BREAST BIOPSY Right 03/27/2017   Lymph node - metastatic carcinoma  . BREAST BIOPSY Right 04/09/2017   Lymph node   . BREAST CYST ASPIRATION Right 11/2009   Dr. Bary Castilla did FNA  . BREAST EXCISIONAL BIOPSY Right 09/28/2017   lumpectomy and 12 lympnode rad neo adj chemo  . COLONOSCOPY  2011  . DILATION AND CURETTAGE OF UTERUS     X3  . ENDOBRONCHIAL ULTRASOUND N/A 04/09/2017   Procedure: ENDOBRONCHIAL ULTRASOUND;  Surgeon: Laverle Hobby, MD;  Location: [facility] ORS;  Service: Pulmonary;  Laterality: N/A;  . ENDOMETRIAL ABLATION    . ENDOMETRIAL BIOPSY  09/2009  . PORTACATH PLACEMENT Left 04/09/2017   Procedure: INSERTION PORT-A-CATH;  Surgeon: Cartina Brousseau Bellow, MD;  Location: [facility] ORS;  Service: General;  Laterality: Left;    Prior to Admission medications   Medication Sig Start Date End Date Taking? Authorizing Provider  Biotin 2500 MCG CAPS Take 5,000 mcg/day by mouth at bedtime.    Yes [provider]  Cholecalciferol (VITAMIN D3) 10000 units TABS Take 1 tablet by mouth at bedtime.    Yes [provider]  Ferrous Sulfate (IRON) 325 (65 Fe) MG TABS Take 1 tablet by mouth at bedtime.    Yes [provider]  Fluticasone Furoate (ARNUITY ELLIPTA) 100 MCG/ACT AEPB Inhale 1 puff into the lungs daily.  06/11/19  Yes Tyler Pita, MD  Multiple Vitamins-Minerals (CENTRUM SILVER ADULT 50+) TABS Take 1 tablet by mouth at bedtime.    Yes [provider]  Omega-3 Fatty Acids (FISH OIL) 1000 MG CAPS Take 1 capsule by mouth at bedtime.    Yes [provider]  pantoprazole (PROTONIX) 20 MG tablet Take 1 tablet (20 mg total) by mouth daily. 06/20/19  Yes Sindy Guadeloupe, MD  predniSONE (DELTASONE) 20 MG tablet Take 2 tablets (40 mg total) by mouth daily with  breakfast. 08/11/19  Yes Sindy Guadeloupe, MD  sulfamethoxazole-trimethoprim (BACTRIM DS) 800-160 MG tablet 1 tablet three times per week. 07/28/19  Yes Tyler Pita, MD  furosemide (LASIX) 20 MG tablet Take 1 tablet (20 mg total) by mouth every other day. Diuretic, water pill. 08/07/19 09/06/19  Enzo Bi, MD  prochlorperazine (COMPAZINE) 10 MG tablet Take 1 tablet (10 mg total) by mouth every 6 (six) hours as needed (Nausea or vomiting). 04/05/17 09/05/18  Sindy Guadeloupe, MD     Allergies Penicillins  Family History  Problem Relation Age of Onset  . Melanoma Maternal Grandmother 15       currently 62  . Brain cancer Maternal Grandfather 59       unk. type; deceased in 45s  . Melanoma Other 39       mat grandmother's father  . Melanoma Father 46       on head; currently 80  . Prostate cancer Maternal Uncle        3 maternal uncles; dx in 75s  . Breast cancer Other        mat grandfather's sister; dx 59s    Social History Social History   Tobacco Use  . Smoking status: Never Smoker  . Smokeless tobacco: Never Used  Substance Use Topics  . Alcohol use: Not Currently  . Drug use: No    Review of Systems  Constitutional: As above Eyes: No visual changes.  ENT: No sore throat. Cardiovascular: Denies chest pain. Respiratory: As above Gastrointestinal: No abdominal pain.  No nausea, no vomiting.   Genitourinary: Negative for dysuria. Musculoskeletal: Negative for back pain. Skin: Negative for rash. Neurological: Negative for headaches or weakness   ____________________________________________   PHYSICAL EXAM:  VITAL SIGNS: ED Triage Vitals [08/13/19 1502]  Enc Vitals Group     BP 121/76     Pulse Rate (!) 30     Resp (!) 34     Temp 98.6 F (37 C)     Temp Source Axillary     SpO2 100 %     Weight 93.1 kg (205 lb 4 oz)     Height 1.6 m (5\' 3" )     Head Circumference      Peak Flow      Pain Score 0     Pain Loc      Pain Edu?      Excl. in [place]?      Constitutional: Alert and oriented.  CPAP in place Eyes: Conjunctivae are normal.  Head: Atraumatic. Nose: No congestion/rhinnorhea.   Neck:  Painless ROM Cardiovascular: Tachycardia, regular rhythm. Grossly normal heart sounds.  Good peripheral circulation. Respiratory: Increased respiratory effort with tachypnea, diffuse wheezing, bibasilar rales. Gastrointestinal: Soft and nontender. No distention.    Musculoskeletal: No lower extremity tenderness nor edema.  Warm and well perfused Neurologic: . No gross focal neurologic deficits are appreciated.  Skin:  Skin is warm, dry and intact. No rash noted.  ____________________________________________   LABS (all labs ordered are listed, but only abnormal results are displayed)  Labs Reviewed  COMPREHENSIVE METABOLIC PANEL - Abnormal; Notable for the following components:      Result Value   CO2 20 (*)    Glucose, Bld 152 (*)    BUN 26 (*)    All other components within normal limits  BRAIN NATRIURETIC PEPTIDE - Abnormal; Notable for the following components:   B Natriuretic Peptide 602.0 (*)    All other components within normal limits  CBC WITH DIFFERENTIAL/PLATELET - Abnormal; Notable for the following components:   WBC 17.1 (*)    RDW 16.2 (*)    Neutro Abs 15.7 (*)    Abs Immature Granulocytes 0.16 (*)    All other components within normal limits  BLOOD GAS, VENOUS - Abnormal; Notable for the following components:   pCO2, Ven 38 (*)    pO2, Ven 123.0 (*)    All other components within normal limits  TROPONIN I (HIGH SENSITIVITY) - Abnormal; Notable for the following components:   Troponin I (High Sensitivity) 226 (*)    All other components within normal limits  CULTURE, BLOOD (ROUTINE X 2)  CULTURE, BLOOD (ROUTINE X 2)  LACTIC ACID, PLASMA  LACTIC ACID, PLASMA  PROCALCITONIN   ____________________________________________  EKG  ED ECG REPORT I, Lavonia Drafts, the attending physician, personally viewed and  interpreted this ECG.  Date: 08/13/2019  Rhythm: Sinus tachycardia QRS Axis: normal Intervals: normal ST/T Wave abnormalities: Nonspecific changes Narrative Interpretation: no evidence of acute ischemia  ____________________________________________  RADIOLOGY  Chest x-ray demonstrates masslike appearance of the right hilum, nodular opacities noted increase in interstitial prominence, may represent worsening pulmonary parenchymal disease secondary to prior COVID-19 infection, also may be pulmonary edema X-ray viewed by me  ____________________________________________   PROCEDURES  Procedure(s) performed: No  Procedures   Critical Care performed: yes  CRITICAL CARE Performed by: Lavonia Drafts   Total critical care time: 35 minutes  Critical care time was exclusive of separately billable procedures and treating other patients.  Critical care was necessary to treat or prevent imminent or life-threatening deterioration.  Critical care was time spent personally by me on the following activities: development of treatment plan with patient and/or surrogate as well as nursing, discussions with consultants, evaluation of patient's response to treatment, examination of patient, obtaining history from patient or surrogate, ordering and performing treatments and interventions, ordering and review of laboratory studies, ordering and review of radiographic studies, pulse oximetry and re-evaluation of patient's condition.  ____________________________________________   INITIAL IMPRESSION / ASSESSMENT AND PLAN / ED COURSE  Pertinent labs & imaging results that were available during my care of the patient were reviewed by me and considered in my medical decision making (see chart for details).  Patient presents with shortness of breath, started on CPAP by EMS, transition to BiPAP here, 3 DuoNeb's as needed, IV Solu-Medrol given.  Labs, chest x-ray pending  Chest x-ray appears  consistent with possible worsening of parenchymal disease, doubt sepsis as patient is afebrile and her presentation is similar to most recent hospitalization.  Suspect pulmonary edema. procalcitonin sent.  Blood culture sent.  Patient evaluated, improved on BiPAP, VBG demonstrates reassuring CO2.  Appears to be oxygenating well currently.  Notified of elevated troponin of 226, suspect this is related to strain  Patient seen by Dr. Patsey Berthold of pulmonary who feels this is likely gemcitabine related and recommends steroids, Lasix.  Does not feel this is infectious.  Patient  has not been taking her Lasix because it makes her get up too much to urinate.    ____________________________________________   FINAL CLINICAL IMPRESSION(S) / ED DIAGNOSES  Final diagnoses:  Acute on chronic respiratory failure with hypoxia (HCC)  Acute pulmonary edema (HCC)  Pneumonitis  Elevated troponin        Note:  This document was prepared using Dragon voice recognition software and may include unintentional dictation errors.   Lavonia Drafts, MD 08/13/19 [id number]

## 2019-08-13 NOTE — Progress Notes (Signed)
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 Her only site of metastatic disease was biopsy-proven hilar adenopathy. She was seen for second opinion at [facility] and underwent neoadjuvant chemotherapy with carbotaxol followed by dose dense AC. Posttreatment scans showed excellent response to treatment with resolution of hilar adenopathy as well as axillary adenopathy but persistent primary tumor at the right breast. She then went on to get right-sided lumpectomy along with axillary lymph node dissection. Final pathology showed 16 mm residual tumor. 0/12 Ln positive for malignancy. Grade 2. Negative margins. No LVI. ER, PR and her 2 neu negative. She also completed radiation therapy to her chest wall and hilar region at in August 2019. She then completed adjuvant xeloda.Patient also had radiation pneumonitis and was treated with a prolonged course of steroids by radiation oncology at [facility].  Patient has had baseline foundation 1 testing done which did not show any evidence of actionable mutations. PDL 1 was 0%. She also had strata testing done at [facility] which did not show any actionable mutations.  Repeat PET CT scan on 08/29/2018 showed new enlarged hypermetabolic right  paratracheal and subcarinal and AP window as well as right hilar lymph nodes compatible with nodal metastatic disease. New and enlarging hypermetabolic irregular solid pulmonary nodules consistent with pulmonary metastases.Eribulin was started in April 2020 but patient had quick disease progression and has been switched to carboplatin and gemcitabine.Patient had significant neutropenia and thrombocytopenia despite giving Neulasta and therefore carboplatin had to be dropped  Patient has had episodic flareups of pneumonitis requiring intermittent doses of steroids.  She also recovered from Covid in December 2020.  Interval history-patient was admitted to the hospital a week ago for episode of hemoptysis and worsening shortness of breath.  CT scan showed worsening airspace opacities in both the lungs consistent with multifocal pneumonia.  She was evaluated by pulmonary as well and infectious etiology was ruled out.  She was restarted her outpatient prednisone 20 mg.  Previously patient was requiring oxygen only on ambulation.  However patient is currently requiring 2 L of oxygen even at rest and she feels significantly winded and tachycardic with minimal activity.  ECOG PS- 2 Pain scale- 0 Review of systems- Review of Systems  Constitutional: Positive for malaise/fatigue. Negative for chills, fever and weight loss.  HENT: Negative for congestion, ear discharge and nosebleeds.   Eyes: Negative for blurred vision.  Respiratory: Positive for shortness of breath. Negative for cough, hemoptysis, sputum production and wheezing.   Cardiovascular: Negative for chest pain, palpitations, orthopnea and claudication.  Gastrointestinal: Negative for abdominal pain, blood in stool, constipation, diarrhea, heartburn, melena, nausea and vomiting.  Genitourinary: Negative for dysuria, flank pain, frequency, hematuria and urgency.  Musculoskeletal: Negative for back pain, joint pain and myalgias.  Skin: Negative  for rash.  Neurological: Negative for dizziness, tingling, focal weakness, seizures, weakness and headaches.  Endo/Heme/Allergies: Does  not bruise/bleed easily.  Psychiatric/Behavioral: Negative for depression and suicidal ideas. The patient does not have insomnia.       Allergies  Allergen Reactions  . Penicillins Rash     Past Medical History:  Diagnosis Date  . Anemia   . Breast cancer ([place])   . Breast cancer, right ([place]) 04/2017   Hx Lumpectomy, Chemo + Rad tx's.  . Breast cyst, right    aspirated by Dr. Bary Castilla  . COVID-19   . Hyperlipidemia   . Personal history of chemotherapy   . Pre-diabetes      Past Surgical History:  Procedure Laterality Date  . AXILLARY LYMPH NODE BIOPSY Right 04/09/2017   Procedure: AXILLARY LYMPH NODE BIOPSY;  Surgeon: Robert Bellow, MD;  Location: [facility] ORS;  Service: General;  Laterality: Right;  . BREAST BIOPSY Right 03/27/2017   US guided breast mass - invasive mammary carcinoma  . BREAST BIOPSY Right 03/27/2017   Lymph node - metastatic carcinoma  . BREAST BIOPSY Right 04/09/2017   Lymph node   . BREAST CYST ASPIRATION Right 11/2009   Dr. Bary Castilla did FNA  . BREAST EXCISIONAL BIOPSY Right 09/28/2017   lumpectomy and 12 lympnode rad neo adj chemo  . COLONOSCOPY  2011  . DILATION AND CURETTAGE OF UTERUS     X3  . ENDOBRONCHIAL ULTRASOUND N/A 04/09/2017   Procedure: ENDOBRONCHIAL ULTRASOUND;  Surgeon: Laverle Hobby, MD;  Location: [facility] ORS;  Service: Pulmonary;  Laterality: N/A;  . ENDOMETRIAL ABLATION    . ENDOMETRIAL BIOPSY  09/2009  . PORTACATH PLACEMENT Left 04/09/2017   Procedure: INSERTION PORT-A-CATH;  Surgeon: Robert Bellow, MD;  Location: [facility] ORS;  Service: General;  Laterality: Left;    Social History   Socioeconomic History  . Marital status: Married    Spouse name: Not on file  . Number of children: Not on file  . Years of education: Not on file  . Highest education level: Not on file  Occupational  History  . Not on file  Tobacco Use  . Smoking status: Never Smoker  . Smokeless tobacco: Never Used  Substance and Sexual Activity  . Alcohol use: Not Currently  . Drug use: No  . Sexual activity: Not Currently  Other Topics Concern  . Not on file  Social History Narrative  . Not on file   Social Determinants of Health   Financial Resource Strain:   . Difficulty of Paying Living Expenses: Not on file  Food Insecurity:   . Worried About Charity fundraiser in the Last Year: Not on file  . Ran Out of Food in the Last Year: Not on file  Transportation Needs:   . Lack of Transportation (Medical): Not on file  . Lack of Transportation (Non-Medical): Not on file  Physical Activity:   . Days of Exercise per Week: Not on file  . Minutes of Exercise per Session: Not on file  Stress:   . Feeling of Stress : Not on file  Social Connections:   . Frequency of Communication with Friends and Family: Not on file  . Frequency of Social Gatherings with Friends and Family: Not on file  . Attends Religious Services: Not on file  . Active Member of Clubs or Organizations: Not on file  . Attends Archivist Meetings: Not on file  . Marital Status: Not on file  Intimate Partner Violence:   . Fear of Current or Ex-Partner: Not on file  . Emotionally Abused: Not on file  .  Physically Abused: Not on file  . Sexually Abused: Not on file    Family History  Problem Relation Age of Onset  . Melanoma Maternal Grandmother 32       currently 74  . Brain cancer Maternal Grandfather 33       unk. type; deceased in 83s  . Melanoma Other 70       mat grandmother's father  . Melanoma Father 64       on head; currently 76  . Prostate cancer Maternal Uncle        3 maternal uncles; dx in 49s  . Breast cancer Other        mat grandfather's sister; dx 59s     Current Outpatient Medications:  .  benzonatate (TESSALON PERLES) 100 MG capsule, Take 2 capsules (200 mg total) by mouth 3  (three) times daily as needed., Disp: , Rfl:  .  Biotin 2500 MCG CAPS, Take 5,000 mcg/day by mouth at bedtime. , Disp: , Rfl:  .  Cholecalciferol (VITAMIN D3) 10000 units TABS, Take 1 tablet by mouth at bedtime. , Disp: , Rfl:  .  Ferrous Sulfate (IRON) 325 (65 Fe) MG TABS, Take 1 tablet by mouth at bedtime. , Disp: , Rfl:  .  Fluticasone Furoate (ARNUITY ELLIPTA) 100 MCG/ACT AEPB, Inhale 1 puff into the lungs daily., Disp: 30 each, Rfl: 3 .  furosemide (LASIX) 20 MG tablet, Take 1 tablet (20 mg total) by mouth every other day. Diuretic, water pill., Disp: 15 tablet, Rfl: 0 .  Multiple Vitamins-Minerals (CENTRUM SILVER ADULT 50+) TABS, Take 1 tablet by mouth at bedtime. , Disp: , Rfl:  .  Omega-3 Fatty Acids (FISH OIL) 1000 MG CAPS, Take 1 capsule by mouth at bedtime. , Disp: , Rfl:  .  pantoprazole (PROTONIX) 20 MG tablet, Take 1 tablet (20 mg total) by mouth daily., Disp: 90 tablet, Rfl: 1 .  sulfamethoxazole-trimethoprim (BACTRIM DS) 800-160 MG tablet, 1 tablet three times per week., Disp: 12 tablet, Rfl: 0 .  HYDROcodone-homatropine (HYCODAN) 5-1.5 MG/5ML syrup, Take 5 mLs by mouth every 6 (six) hours as needed for cough. (Patient not taking: Reported on 08/11/2019), Disp: 240 mL, Rfl: 0 .  predniSONE (DELTASONE) 20 MG tablet, Take 2 tablets (40 mg total) by mouth daily with breakfast., Disp: 40 tablet, Rfl: 0  Physical exam:  Vitals:   08/11/19 1123  BP: 117/76  Pulse: 99  Temp: (!) 97 F (36.1 C)  TempSrc: Tympanic  SpO2: 99%  Weight: 203 lb (92.1 kg)   Physical Exam Constitutional:      Comments: Sitting in a wheelchair.  She is on oxygen  HENT:     Head: Normocephalic and atraumatic.  Eyes:     Pupils: Pupils are equal, round, and reactive to light.  Cardiovascular:     Rate and Rhythm: Regular rhythm. Tachycardia present.     Heart sounds: Normal heart sounds.  Pulmonary:     Breath sounds: Normal breath sounds.     Comments: Effort of breathing is increased Abdominal:      General: Bowel sounds are normal.     Palpations: Abdomen is soft.  Musculoskeletal:     Cervical back: Normal range of motion.     Comments: Trace bilateral pedal edema  Skin:    General: Skin is warm and dry.  Neurological:     Mental Status: She is alert and oriented to person, place, and time.      CMP Latest Ref Rng &  Units 08/07/2019  Glucose 70 - 99 mg/dL 93  BUN 6 - 20 mg/dL 19  Creatinine 0.44 - 1.00 mg/dL 0.55  Sodium 135 - 145 mmol/L 141  Potassium 3.5 - 5.1 mmol/L 3.9  Chloride 98 - 111 mmol/L 107  CO2 22 - 32 mmol/L 25  Calcium 8.9 - 10.3 mg/dL 9.0  Total Protein 6.5 - 8.1 g/dL -  Total Bilirubin 0.3 - 1.2 mg/dL -  Alkaline Phos 38 - 126 U/L -  AST 15 - 41 U/L -  ALT 0 - 44 U/L -   CBC Latest Ref Rng & Units 08/07/2019  WBC 4.0 - 10.5 K/uL 9.4  Hemoglobin 12.0 - 15.0 g/dL 9.7(L)  Hematocrit 36.0 - 46.0 % 31.7(L)  Platelets 150 - 400 K/uL 289    No images are attached to the encounter.  CT Angio Chest PE W and/or Wo Contrast  Result Date: 08/06/2019 CLINICAL DATA:  Hemoptysis, history of breast cancer and COVID EXAM: CT ANGIOGRAPHY CHEST WITH CONTRAST TECHNIQUE: Multidetector CT imaging of the chest was performed using the standard protocol during bolus administration of intravenous contrast. Multiplanar CT image reconstructions and MIPs were obtained to evaluate the vascular anatomy. CONTRAST:  64m OMNIPAQUE IOHEXOL 350 MG/ML SOLN COMPARISON:  July 09, 2019 FINDINGS: Cardiovascular: There is a optimal opacification of the pulmonary arteries. There is no central,segmental, or subsegmental filling defects within the pulmonary arteries. A left-sided MediPort catheter is seen with the tip in the right atrium. The heart is normal in size. No pericardial effusion or thickening. No evidence right heart strain. There is normal three-vessel brachiocephalic anatomy without proximal stenosis. Scattered mild aortic atherosclerosis. Mediastinum/Nodes: No hilar, mediastinal, or  axillary adenopathy. Thyroid gland, trachea, and esophagus demonstrate no significant findings. Lungs/Pleura: There is new/worsening multifocal patchy predominantly peripherally based ground-glass opacities seen throughout both lungs. There is also slight interval worsening in the tree-in-bud nodular hazy opacities within the left lung apex. There is unchanged spiculated masslike consolidation within the right infrahilar region, likely from post treatment changes/fibrosis. There is a trace right pleural effusion present. Upper Abdomen: No acute abnormalities present in the visualized portions of the upper abdomen. Musculoskeletal: No chest wall abnormality. No acute or significant osseous findings. Again noted is diffuse skin thickening seen along the right breast. There is a surgical clips within the right breast. Review of the MIP images confirms the above findings. IMPRESSION: 1. No central, segmental, or subsegmental pulmonary embolism. 2. Interval worsening of the multifocal patchy ground-glass opacities throughout both lungs, consistent with multifocal pneumonia. 3. Interval slight worsening in the tree-in-bud nodular opacities within the left upper lobe, likely due to atypical infectious etiology, however cannot exclude underlying metastatic disease. 4. Post treatment radiation/fibrotic changes in the right infrahilar region. 5. Trace right pleural effusion Electronically Signed   By: BPrudencio PairM.D.   On: 08/06/2019 00:17   DG Chest Portable 1 View  Result Date: 08/05/2019 CLINICAL DATA:  59year old female with hemoptysis. History of breast cancer. EXAM: PORTABLE CHEST 1 VIEW COMPARISON:  Chest radiograph dated 04/09/2017. CT dated 07/09/2019. FINDINGS: Port-A-Cath with tip in the region of the cavoatrial junction. Right hilar density corresponding to the post treatment/post radiation fibrosis seen on the prior CT. Diffuse interstitial and vascular prominence with scattered faint reticulonodular  densities throughout the lungs. Probable small bilateral pleural effusions. No pneumothorax. The cardiac silhouette is within normal limits. Atherosclerotic calcification of the aorta. No acute osseous pathology. IMPRESSION: Right hilar post radiation fibrosis and scattered bilateral reticulonodular densities and  small bilateral pleural effusions. Overall findings are similar to the CT of 07/09/2019. Electronically Signed   By: Anner Crete M.D.   On: 08/05/2019 22:59     Assessment and plan- Patient is a 59 y.o. female with metastatic stage IV triple negative breast cancer with metastases to the lung and hilar lymph nodes.  This is a post hospital discharge follow-up  Acute on chronic hypoxic respiratory failure: CT scan in general is difficult interpreting her case given her prior episodes of pneumonitis, ongoing metastatic breast cancer with possible lymphangitic spread and hilar metastases as well as recent cold in December 2020.  More recently when patient was admitted to the hospital last week infectious etiology was ruled out and patient was restarted on her outpatient dose of prednisone 20 mg.  However my concern is her increasing oxygen requirements.  Previously patient was not requiring oxygen at rest and was able to carry on her ADLs without significant oxygen requirements.  I have discussed her case personally with Dr. Patsey Berthold today over the phone.  I would like to increase her prednisone to 40 mg and she will stay at that dose until she is evaluated by Dr. Patsey Berthold on 08/26/2019.  Metastatic breast cancer: Presently her hilar lymphadenopathy did not appear worse on CT scans.  She does not have any evidence of distant metastatic disease other than findings in her lungs.  It is therefore hard to justify that the CT scan findings are reflective of progressive malignancy especially in the absence of bronchoscopy.  Bronchoscopy would be particularly challenging in her case given her existing  oxygen requirements and possibly low yield.  Although gemcitabine can cause pneumonitis incidence is fairly low and historically higher rates often suggest pneumonitis was higher when gemcitabine was combined with other chemotherapeutic agents such as Taxol or bleomycin.  Patient has limited options for her triple negative breast cancer as she has gone through multiple lines of chemotherapy already. Vinorelbine or navelbine have not been tried yet but patient has had progression on eribulin which is a similar microtubule agent.  sacituzumab remains a potential option but is also associated with side effect of dyspnea.  I am therefore hesitant to switch her from gemcitabine to alternative agents in the absence of clear progression and also difficulty in attributing gemcitabine to her worsening shortness of breath.  Patient has felt better with increasing the dose of her steroids in the past and I will see how she does with increasing the prednisone dose to 40 mg.  If she is significantly better I will consider rechallenging her with gemcitabine.  Patient verbalized understanding   Total face to face encounter time for this patient visit was 30 min.  Total non face to face encounter time for this patient on the day of the visit was 10 min. Time spent in discussing patients case with Dr. Patsey Berthold    Visit Diagnosis 1. Acute on chronic respiratory failure with hypoxia (HCC)   2. Malignant neoplasm of upper-outer quadrant of right breast in female, estrogen receptor negative ([place])      Dr. Randa Evens, MD, MPH [facility] [phone] 08/13/2019 8:11 AM

## 2019-08-13 NOTE — Consult Note (Signed)
Name: Ashley Molina MRN: [id number] DOB: 12-17-1960    ADMISSION DATE:  08/13/2019 CONSULTATION DATE:  08/13/2019  REFERRING MD :  Dr. Reesa Chew  CHIEF COMPLAINT:  Shortness of breath, hypoxia  BRIEF PATIENT DESCRIPTION:  60 y.o. Female with PMH notable for COVID-19 in 05/12/2019, right breast cancer with lung mets who is admitted 3/10//21 with Acute on Chronic Hypoxic Respiratory Failure secondary to Pneumonitis/pulmonary fibrosis & Pulmonary edema requiring BiPAP.   SIGNIFICANT EVENTS  3/10- Admission to Stepdown 3/10- PCCM consulted  STUDIES:  3/10- CXR>>Masslike appearance of right hilum similar to previous exam. Nodular opacities are again suggested and there is increase in interstitial prominence when compared to the chest x-ray from 08/05/2019. Findings may represent worsening of pulmonary parenchymal disease, associated with previously reported COVID-19 infection, particularly in the left lung base, potentially with a background of pulmonary edema.   CULTURES: SARS-CoV-2 PCR 3/10>> Blood culture x2 3/10>>  ANTIBIOTICS: N/A  HISTORY OF PRESENT ILLNESS:   Ashley Molina is a 59 y.o. Female with a past medical history significant for COVID-19 infection in 05/12/2019, and right breast cancer with lung mets s/p lumpectomy, chemotherapy, and radiation who presents to [facility] ED on 08/13/19 with complaints of worsening shortness of breath and hypoxia on her baseline 2L O2 via nasal cannula.  Of note she was recently admitted from 08/05/19 to 08/07/19 due to SOB and hemoptysis. During her stay there was concern for Pneumonitis secondary to COVID-19 and Gemcitabine.  She was discharged home on oxygen, steroids, and Lasix.  However she was not compliant with Lasix due to dyspnea on exertion, as it takes approximately 40 steps to reach her restroom.  Her prednisone dose was recently increased to 40 mg daily with Pulmonology follow up scheduled on 08/26/19.  Since her recent discharge, she  reports persistent shortness of breath that has progressively gotten worse.  She increased her oxygen to 4L [place] with no improvement in symptoms.  She denies chest pain, fever, chills, sputum production, hemoptysis,nausea, vomiting, dysuria, or sick contacts.  Upon presentation to the ED she was noted to be tachycardic, tachypneic, and hypoxic with O2 sats 85% on 4L nasal cannula.  Initial workup revealed Leukocytosis with Neutrophilia, lactic acid 2.1, procalcitonin <0.10, high sensitivity troponin 226, BNP 602, bicarb 20, and glucose 152.  Chest x-ray is concerning for worsening parenchymal disease and possible pulmonary edema.  She was given IV steroids, bronchodilators, IV lasix, and placed on BiPAP with noted improvement in her respiratory status.  She was admitted to the [facility] unit by the Hospitalist for further workup and treatment of Acute on Chronic Hypoxic Respiratory Failure secondary to Pneumonitis/Pulmonary Fibrosis and pulmonary edema.  PCCM is consulted for further management.   PAST MEDICAL HISTORY :   has a past medical history of Anemia, Breast cancer ([place]), Breast cancer, right ([place]) (04/2017), Breast cyst, right, COVID-19, Hyperlipidemia, Personal history of chemotherapy, and Pre-diabetes.  has a past surgical history that includes Dilation and curettage of uterus; Endometrial biopsy (09/2009); Endometrial ablation; Colonoscopy (2011); Breast cyst aspiration (Right, 11/2009); Portacath placement (Left, 04/09/2017); Axillary lymph node biopsy (Right, 04/09/2017); Endobronchial ultrasound (N/A, 04/09/2017); Breast biopsy (Right, 03/27/2017); Breast biopsy (Right, 03/27/2017); Breast biopsy (Right, 04/09/2017); and Breast excisional biopsy (Right, 09/28/2017). Prior to Admission medications   Medication Sig Start Date End Date Taking? Authorizing Provider  Biotin 2500 MCG CAPS Take 5,000 mcg/day by mouth at bedtime.    Yes [provider]  Cholecalciferol (VITAMIN D3) 10000 units  TABS Take 1 tablet by  mouth at bedtime.    Yes [provider]  Ferrous Sulfate (IRON) 325 (65 Fe) MG TABS Take 1 tablet by mouth at bedtime.    Yes [provider]  Fluticasone Furoate (ARNUITY ELLIPTA) 100 MCG/ACT AEPB Inhale 1 puff into the lungs daily. 06/11/19  Yes Tyler Pita, MD  Multiple Vitamins-Minerals (CENTRUM SILVER ADULT 50+) TABS Take 1 tablet by mouth at bedtime.    Yes [provider]  Omega-3 Fatty Acids (FISH OIL) 1000 MG CAPS Take 1 capsule by mouth at bedtime.    Yes [provider]  pantoprazole (PROTONIX) 20 MG tablet Take 1 tablet (20 mg total) by mouth daily. 06/20/19  Yes Sindy Guadeloupe, MD  predniSONE (DELTASONE) 20 MG tablet Take 2 tablets (40 mg total) by mouth daily with breakfast. 08/11/19  Yes Sindy Guadeloupe, MD  sulfamethoxazole-trimethoprim (BACTRIM DS) 800-160 MG tablet 1 tablet three times per week. 07/28/19  Yes Tyler Pita, MD  furosemide (LASIX) 20 MG tablet Take 1 tablet (20 mg total) by mouth every other day. Diuretic, water pill. 08/07/19 09/06/19  Enzo Bi, MD  prochlorperazine (COMPAZINE) 10 MG tablet Take 1 tablet (10 mg total) by mouth every 6 (six) hours as needed (Nausea or vomiting). 04/05/17 09/05/18  Sindy Guadeloupe, MD   Allergies  Allergen Reactions  . Penicillins Rash    FAMILY HISTORY:  family history includes Brain cancer (age of onset: 73) in her maternal grandfather; Breast cancer in an other family member; Melanoma (age of onset: 83) in her father; Melanoma (age of onset: 52) in an other family member; Melanoma (age of onset: 33) in her maternal grandmother; Prostate cancer in her maternal uncle. SOCIAL HISTORY:  reports that she has never smoked. She has never used smokeless tobacco. She reports previous alcohol use. She reports that she does not use drugs.   COVID-19 DISASTER DECLARATION:  FULL CONTACT PHYSICAL EXAMINATION WAS NOT POSSIBLE DUE TO TREATMENT OF COVID-19 AND  CONSERVATION OF  PERSONAL PROTECTIVE EQUIPMENT, LIMITED EXAM FINDINGS INCLUDE-  Patient assessed or the symptoms described in the history of present illness.  In the context of the Global COVID-19 pandemic, which necessitated consideration that the patient might be at risk for infection with the SARS-CoV-2 virus that causes COVID-19, Institutional protocols and algorithms that pertain to the evaluation of patients at risk for COVID-19 are in a state of rapid change based on information released by regulatory bodies including the CDC and federal and state organizations. These policies and algorithms were followed during the patient's care while in hospital.  REVIEW OF SYSTEMS:  Positives in BOLD Constitutional: Negative for fever, chills, weight loss, malaise/fatigue and diaphoresis.  HENT: Negative for hearing loss, ear pain, nosebleeds, congestion, sore throat, neck pain, tinnitus and ear discharge.   Eyes: Negative for blurred vision, double vision, photophobia, pain, discharge and redness.  Respiratory: Negative for +cough, hemoptysis, sputum production, +shortness of breath, +wheezing and stridor.   Cardiovascular: Negative for chest pain, palpitations, orthopnea, claudication, +leg swelling and PND.  Gastrointestinal: Negative for heartburn, nausea, vomiting, abdominal pain, diarrhea, constipation, blood in stool and melena.  Genitourinary: Negative for dysuria, urgency, frequency, hematuria and flank pain.  Musculoskeletal: Negative for myalgias, +back pain, joint pain and falls.  Skin: Negative for itching and rash.  Neurological: Negative for dizziness, tingling, tremors, sensory change, speech change, focal weakness, seizures, loss of consciousness, weakness and headaches.  Endo/Heme/Allergies: Negative for environmental allergies and polydipsia. Does not bruise/bleed easily.  SUBJECTIVE:  Reports shortness  of breath (improved since arrival to ED), intermittent nonproductive cough, and 1/10 dull aching  back pain Denies chest pain, fever/chills, N/V, abdominal pain, diarrhea On BiPAP  VITAL SIGNS: Temp:  [97.6 F (36.4 C)-98.6 F (37 C)] (P) 97.6 F (36.4 C) (03/10 2030) Pulse Rate:  [30-127] (P) 122 (03/10 2030) Resp:  [17-34] 17 (03/10 1930) BP: (93-121)/(56-79) (P) 105/77 (03/10 2030) SpO2:  [97 %-100 %] 98 % (03/10 1930) Weight:  [93.1 kg] 93.1 kg (03/10 1502)  PHYSICAL EXAMINATION: General:  Acutely ill appearing female, sitting in bed, on BiPAP, with mild respiratory distress, able to talk in full sentences Neuro:  Awake, A&O x4, follows commands, no focal deficits, speech clear HEENT:  Atraumatic, normocephalic, neck supple, no JVD Cardiovascular:  Tachycardia, regular rhythm, s1s2, no M/R/G, 2+ pulses Lungs:  Coarse breath sounds with expiratory wheezing bilaterally, even, BiPAP assisted Abdomen:  Obese, soft, nontender, nondistended, no guarding or rebound tenderness, BS+ x4 Musculoskeletal:  Normal bulk and tone, no deformities, 1+ edema bilateral LE Skin:  Warm and dry.  No obvious rashes, lesions, or ulcerations  Recent Labs  Lab 08/07/19 0601 08/13/19 1502  NA 141 138  K 3.9 4.3  CL 107 105  CO2 25 20*  BUN 19 26*  CREATININE 0.55 0.71  GLUCOSE 93 152*   Recent Labs  Lab 08/07/19 0601 08/13/19 1502  HGB 9.7* 12.9  HCT 31.7* 41.4  WBC 9.4 17.1*  PLT 289 307   DG Chest Port 1 View  Result Date: 08/13/2019 CLINICAL DATA:  Shortness of breath, tachypnea with decreased oxygen saturations., history of breast cancer. EXAM: PORTABLE CHEST 1 VIEW COMPARISON:  08/06/2019, CT of the chest. FINDINGS: Masslike appearance of right hilum similar to previous exam. Cardiomediastinal contours are stable. Left Port-A-Cath terminates at the caval to atrial junction. Nodular opacities are again suggested and there is increase in interstitial prominence when compared to the chest x-ray from 08/05/2019. Blunting of right costophrenic angles suggest scarring or small effusion.  No signs of dense consolidation aside from perihilar changes discussed above. Visualized skeletal structures without acute bone finding. IMPRESSION: Masslike appearance of right hilum similar to previous exam. Nodular opacities are again suggested and there is increase in interstitial prominence when compared to the chest x-ray from 08/05/2019. Findings may represent worsening of pulmonary parenchymal disease, associated with previously reported COVID-19 infection, particularly in the left lung base, potentially with a background of pulmonary edema. Electronically Signed   By: Zetta Bills M.D.   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/Pulmonary Fibrosis and Pulmonary Edema Hx: Right breast Cancer with lung mets -Supplemental O2 as needed to maintain O2 sats >92% -BiPAP, wean as tolerated; Keep NPO while on BiPAP -High risk for intubation -Follow intermittent CXR & ABG as needed -IV & inhaled steroids -Bronchodilators -IV Lasix as BP and renal function permits  Elevated troponin, likely demand ischemia Elevated BNP (Normal Echo on 04/22/2019) -Continuous cardiac monitoring -Maintain MAP >65 -EKG without significant ST changes -Trend troponin until downtrending -IV Lasix as BP and renal function permits, currently on 40 mg IV daily -2D Echocardiogram pending  Leukocytosis, ? steroid induced -Monitor fever curve -Trend WBC's and Procalcitonin -If procalcitonin elevated, will place on empiric antibiotics ~ Procalcitonin is <0.10 so will hold off on Abx -CXR without obvious infiltrate -Check UA  Stage IV right Breast Cancer (s/p Lumpectomy, chemo, radiation) with Lung mets -Consult Oncology, appreciate input -Consult Palliative Care  DISPOSITION: STEPDOWN GOALS OF CARE: FULL CODE VTE PROPHYLAXIS: SQ LOVENOX SUP Prophylaxis: IV Protonix UPDATES: UPDATED PT AT BEDSIDE 08/13/19  Darel Hong,  AGACNP-BC [facility] Pager: [phone]  08/13/2019, 8:53 PM

## 2019-08-13 NOTE — Telephone Encounter (Signed)
Dr. Gonzalez please advise. Thanks 

## 2019-08-13 NOTE — Telephone Encounter (Signed)
She will have to be reevaluated at the emergency room particularly if her oxygen levels have increased dramatically.

## 2019-08-13 NOTE — ED Notes (Signed)
Report provided to [place], Therapist, sports. Per RN, she is off floor with another pt and will call when pt is able to be brought up.

## 2019-08-14 ENCOUNTER — Inpatient Hospital Stay: Payer: Managed Care, Other (non HMO)

## 2019-08-14 ENCOUNTER — Inpatient Hospital Stay
Admit: 2019-08-14 | Discharge: 2019-08-14 | Disposition: A | Discharge status: Home / Self Care | Payer: Managed Care, Other (non HMO) | Attending: Internal Medicine | Admitting: Internal Medicine

## 2019-08-14 DIAGNOSIS — R0682 Tachypnea, not elsewhere classified: Secondary | ICD-10-CM

## 2019-08-14 DIAGNOSIS — C50511 Malignant neoplasm of lower-outer quadrant of right female breast: Secondary | ICD-10-CM

## 2019-08-14 DIAGNOSIS — E669 Obesity, unspecified: Secondary | ICD-10-CM

## 2019-08-14 DIAGNOSIS — J9600 Acute respiratory failure, unspecified whether with hypoxia or hypercapnia: Secondary | ICD-10-CM

## 2019-08-14 DIAGNOSIS — Z171 Estrogen receptor negative status [ER-]: Secondary | ICD-10-CM

## 2019-08-14 DIAGNOSIS — C771 Secondary and unspecified malignant neoplasm of intrathoracic lymph nodes: Secondary | ICD-10-CM

## 2019-08-14 LAB — CBC
HCT: 39.2 % (ref 36.0–46.0)
Hemoglobin: 12.6 g/dL (ref 12.0–15.0)
MCH: 29 pg (ref 26.0–34.0)
MCHC: 32.1 g/dL (ref 30.0–36.0)
MCV: 90.3 fL (ref 80.0–100.0)
Platelets: 256 10*3/uL (ref 150–400)
RBC: 4.34 MIL/uL (ref 3.87–5.11)
RDW: 15.9 % — ABNORMAL HIGH (ref 11.5–15.5)
WBC: 10.4 10*3/uL (ref 4.0–10.5)
nRBC: 0 % (ref 0.0–0.2)

## 2019-08-14 LAB — URINALYSIS, COMPLETE (UACMP) WITH MICROSCOPIC
Bacteria, UA: NONE SEEN
Bilirubin Urine: NEGATIVE
Glucose, UA: NEGATIVE mg/dL
Hgb urine dipstick: NEGATIVE
Ketones, ur: NEGATIVE mg/dL
Leukocytes,Ua: NEGATIVE
Nitrite: NEGATIVE
Protein, ur: NEGATIVE mg/dL
Specific Gravity, Urine: 1.009 (ref 1.005–1.030)
pH: 5 (ref 5.0–8.0)

## 2019-08-14 LAB — BASIC METABOLIC PANEL
Anion gap: 12 (ref 5–15)
BUN: 20 mg/dL (ref 6–20)
CO2: 25 mmol/L (ref 22–32)
Calcium: 9.7 mg/dL (ref 8.9–10.3)
Chloride: 103 mmol/L (ref 98–111)
Creatinine, Ser: 0.78 mg/dL (ref 0.44–1.00)
GFR calc Af Amer: >60 mL/min (ref >60)
GFR calc non Af Amer: >60 mL/min (ref >60)
Glucose, Bld: 130 mg/dL — ABNORMAL HIGH (ref 70–99)
Potassium: 4 mmol/L (ref 3.5–5.1)
Sodium: 140 mmol/L (ref 135–145)

## 2019-08-14 LAB — SARS CORONAVIRUS 2 (TAT 6-24 HRS): SARS Coronavirus 2: NEGATIVE

## 2019-08-14 LAB — ECHOCARDIOGRAM COMPLETE
Height: 63 in
Weight: 3167.57 oz

## 2019-08-14 LAB — TROPONIN I (HIGH SENSITIVITY)
Troponin I (High Sensitivity): 152 ng/L (ref <18)
Troponin I (High Sensitivity): 150 ng/L (ref <18)
Troponin I (High Sensitivity): 145 ng/L (ref <18)

## 2019-08-14 LAB — LACTIC ACID, PLASMA
Lactic Acid, Venous: 1.4 mmol/L (ref 0.5–1.9)
Lactic Acid, Venous: 1.5 mmol/L (ref 0.5–1.9)

## 2019-08-14 LAB — BRAIN NATRIURETIC PEPTIDE: B Natriuretic Peptide: 602 pg/mL — ABNORMAL HIGH (ref 0.0–100.0)

## 2019-08-14 LAB — MRSA PCR SCREENING: MRSA by PCR: NEGATIVE

## 2019-08-14 MED ORDER — HYDROCOD POLST-CPM POLST ER 10-8 MG/5ML PO SUER
5.0000 mL | Freq: Two times a day (BID) | ORAL | Status: DC
  Administered 2019-08-14 – 2019-08-16 (×5): 5 mL via ORAL
  Filled 2019-08-14 (×5): qty 5

## 2019-08-14 MED ORDER — IPRATROPIUM-ALBUTEROL 0.5-2.5 (3) MG/3ML IN SOLN
3.0000 mL | Freq: Four times a day (QID) | RESPIRATORY_TRACT | Status: DC
  Administered 2019-08-14 – 2019-08-16 (×9): 3 mL via RESPIRATORY_TRACT
  Filled 2019-08-14 (×9): qty 3

## 2019-08-14 MED ORDER — OMEGA-3-ACID ETHYL ESTERS 1 G PO CAPS
1.0000 g | ORAL_CAPSULE | Freq: Every day | ORAL | Status: DC
  Administered 2019-08-15 – 2019-08-16 (×2): 1 g via ORAL
  Filled 2019-08-14 (×2): qty 1

## 2019-08-14 MED ORDER — SULFAMETHOXAZOLE-TRIMETHOPRIM 800-160 MG PO TABS
1.0000 | ORAL_TABLET | ORAL | Status: DC
  Administered 2019-08-15: 1 via ORAL
  Filled 2019-08-14: qty 1

## 2019-08-14 MED ORDER — BUDESONIDE 0.25 MG/2ML IN SUSP
0.2500 mg | Freq: Two times a day (BID) | RESPIRATORY_TRACT | Status: DC
  Administered 2019-08-14 – 2019-08-15 (×2): 0.25 mg via RESPIRATORY_TRACT
  Filled 2019-08-14 (×2): qty 2

## 2019-08-14 MED ORDER — METHYLPREDNISOLONE SODIUM SUCC 40 MG IJ SOLR
20.0000 mg | Freq: Two times a day (BID) | INTRAMUSCULAR | Status: DC
  Administered 2019-08-14 – 2019-08-16 (×4): 20 mg via INTRAVENOUS
  Filled 2019-08-14 (×4): qty 1

## 2019-08-14 MED ORDER — PANTOPRAZOLE SODIUM 40 MG PO TBEC
40.0000 mg | DELAYED_RELEASE_TABLET | Freq: Every day | ORAL | Status: DC
  Administered 2019-08-15 – 2019-08-16 (×2): 40 mg via ORAL
  Filled 2019-08-14 (×2): qty 1

## 2019-08-14 MED ORDER — ADULT MULTIVITAMIN W/MINERALS CH
1.0000 | ORAL_TABLET | Freq: Every day | ORAL | Status: DC
  Administered 2019-08-15 – 2019-08-16 (×2): 1 via ORAL
  Filled 2019-08-14 (×2): qty 1

## 2019-08-14 MED ORDER — FUROSEMIDE 10 MG/ML IJ SOLN
40.0000 mg | Freq: Every day | INTRAMUSCULAR | Status: DC
  Administered 2019-08-14 – 2019-08-16 (×3): 40 mg via INTRAVENOUS
  Filled 2019-08-14 (×3): qty 4

## 2019-08-14 NOTE — Progress Notes (Signed)
Good day. Attitude more positive today. Patient up and sat in chair most of afternoon. States she feels better up in chair. Talked about fear of shortness of breath when she is up. Appeared to feel better after she got up. Troponin ordered per Dr. Reesa Chew was 152. Patient has not complained of chest pain all day. 2 more Troponins ordered 2 hours apart from 1st one drawn at 1550.

## 2019-08-14 NOTE — Assessment & Plan Note (Addendum)
#  59 year old female patient with history of metastatic breast cancer currently admitted to hospital for acute respiratory failure  #Metastatic triple negative breast cancer-most recently gemcitabine; currently on hold because of acute issues.  Clinically less likely progressive lymphangitic spread given rapid improvement with measures discussed below.  #Acute respiratory failure-needing BiPAP support-etiology unclear-fluid overload/pneumonitis/infection-respiratory status improvement noted.  Currently on 2 L nasal cannula.  Discussed during the rapid improvement of her respiratory status-likely pneumonitis/overload a possible cause of her respiratory failure.  Discussed the other chemotherapeutic options for the patient.  Patient awaiting repeat evaluation at [facility].  Patient will continue follow-up with Dr. Janese Banks.  Also discussed with her Patsey Berthold

## 2019-08-14 NOTE — Progress Notes (Signed)
Pt taken off bipap per patients requests, placed on 4lpm [place], tolerating well, HR 96/min, respiratory rate 18/min, will continue to monitor.

## 2019-08-14 NOTE — Progress Notes (Signed)
*  PRELIMINARY RESULTS* Echocardiogram 2D Echocardiogram has been performed.  [name] 08/14/2019, 9:19 AM

## 2019-08-14 NOTE — Progress Notes (Addendum)
Pharmacy Electrolyte Monitoring Consult:  Pharmacy consulted to assist in monitoring and replacing electrolytes in this 59 y.o. female admitted on 08/13/2019 with pneumonitis and pulmonary edema. Patient with past medical history significant for Breast Cancer, COVID-19, and Hyperlipidemia. Patient is currently ordered furosemide 40mg  IV Daily.   Labs:  Sodium (mmol/L)  Date Value  08/14/2019 140  03/28/2017 140   Potassium (mmol/L)  Date Value  08/14/2019 4.0   Magnesium (mg/dL)  Date Value  08/07/2019 2.1   Calcium (mg/dL)  Date Value  08/14/2019 9.7   Albumin (g/dL)  Date Value  08/13/2019 3.9  03/28/2017 4.3    Assessment/Plan: No replacement warranted. Will replace orally when possible in setting of diuresis.   Will obtain BMP/Magnesium/Phosphorus with am labs.   Will replace for goal potassium ~ 4 and goal magnesium ~ 2.   Pharmacy will continue to monitor and adjust per consult.   Tremeka Helbling L 08/14/2019 11:33 AM

## 2019-08-14 NOTE — Consult Note (Signed)
[place] CONSULT NOTE  Patient Care Team: Patient, No Pcp Per as PCP - General (General Practice) Byrnett, Forest Gleason, MD (General Surgery) Gae Dry, MD as Referring Physician (Obstetrics and Gynecology)  CHIEF COMPLAINTS/PURPOSE OF CONSULTATION: Metastatic triple negative breast cancer  HISTORY OF PRESENTING ILLNESS:  Ashley Molina 59 y.o.  female history of metastatic triple negative breast cancer status post multiple lines of therapy most recently treated with gemcitabine is currently admitted to hospital for worsening respiratory status.  Patient unfortunately had a recent Covid pneumonia.  Patient's chemotherapy was interrupted because of respiratory/infectious issues.  Patient was recently discharged from hospital after being admitted for worsening respiratory status.   More recently patient was evaluated by her primary oncologist Dr. Janese Banks in the clinic.  Given the worsening respiratory status after discussion with pulmonary-patient was started on prednisone 40 mg a day.   However as per patient-patient's breathing progressively got worse with worsening cough.  She had difficulty laying on the bed.  This led to admission to hospital.  Patient is currently in ICU on BiPAP.  She seems to be slightly comfortable on the BiPAP.  She is alert.  She denies any pain.  She is interested in full scope of treatment at this time.  Review of Systems  Unable to perform ROS: Medical condition     MEDICAL HISTORY:  Past Medical History:  Diagnosis Date  . Anemia   . Breast cancer ([place])   . Breast cancer, right ([place]) 04/2017   Hx Lumpectomy, Chemo + Rad tx's.  . Breast cyst, right    aspirated by Dr. Bary Castilla  . COVID-19   . Hyperlipidemia   . Personal history of chemotherapy   . Pre-diabetes     SURGICAL HISTORY: Past Surgical History:  Procedure Laterality Date  . AXILLARY LYMPH NODE BIOPSY Right 04/09/2017   Procedure: AXILLARY LYMPH NODE BIOPSY;   Surgeon: Robert Bellow, MD;  Location: [facility] ORS;  Service: General;  Laterality: Right;  . BREAST BIOPSY Right 03/27/2017   US guided breast mass - invasive mammary carcinoma  . BREAST BIOPSY Right 03/27/2017   Lymph node - metastatic carcinoma  . BREAST BIOPSY Right 04/09/2017   Lymph node   . BREAST CYST ASPIRATION Right 11/2009   Dr. Bary Castilla did FNA  . BREAST EXCISIONAL BIOPSY Right 09/28/2017   lumpectomy and 12 lympnode rad neo adj chemo  . COLONOSCOPY  2011  . DILATION AND CURETTAGE OF UTERUS     X3  . ENDOBRONCHIAL ULTRASOUND N/A 04/09/2017   Procedure: ENDOBRONCHIAL ULTRASOUND;  Surgeon: Laverle Hobby, MD;  Location: [facility] ORS;  Service: Pulmonary;  Laterality: N/A;  . ENDOMETRIAL ABLATION    . ENDOMETRIAL BIOPSY  09/2009  . PORTACATH PLACEMENT Left 04/09/2017   Procedure: INSERTION PORT-A-CATH;  Surgeon: Robert Bellow, MD;  Location: [facility] ORS;  Service: General;  Laterality: Left;    SOCIAL HISTORY: Social History   Socioeconomic History  . Marital status: Married    Spouse name: Not on file  . Number of children: Not on file  . Years of education: Not on file  . Highest education level: Not on file  Occupational History  . Not on file  Tobacco Use  . Smoking status: Never Smoker  . Smokeless tobacco: Never Used  Substance and Sexual Activity  . Alcohol use: Not Currently  . Drug use: No  . Sexual activity: Not Currently  Other Topics Concern  . Not on file  Social History Narrative  .  Not on file   Social Determinants of Health   Financial Resource Strain:   . Difficulty of Paying Living Expenses:   Food Insecurity:   . Worried About Charity fundraiser in the Last Year:   . Arboriculturist in the Last Year:   Transportation Needs:   . Film/video editor (Medical):   Marland Kitchen Lack of Transportation (Non-Medical):   Physical Activity:   . Days of Exercise per Week:   . Minutes of Exercise per Session:   Stress:   . Feeling of Stress :    Social Connections:   . Frequency of Communication with Friends and Family:   . Frequency of Social Gatherings with Friends and Family:   . Attends Religious Services:   . Active Member of Clubs or Organizations:   . Attends Archivist Meetings:   Marland Kitchen Marital Status:   Intimate Partner Violence:   . Fear of Current or Ex-Partner:   . Emotionally Abused:   Marland Kitchen Physically Abused:   . Sexually Abused:     FAMILY HISTORY: Family History  Problem Relation Age of Onset  . Melanoma Maternal Grandmother 23       currently 61  . Brain cancer Maternal Grandfather 51       unk. type; deceased in 57s  . Melanoma Other 65       mat grandmother's father  . Melanoma Father 32       on head; currently 39  . Prostate cancer Maternal Uncle        3 maternal uncles; dx in 23s  . Breast cancer Other        mat grandfather's sister; dx 37s    ALLERGIES:  is allergic to penicillins.  MEDICATIONS:  Current Facility-Administered Medications  Medication Dose Route Frequency Provider Last Rate Last Admin  . acetaminophen (TYLENOL) tablet 650 mg  650 mg Oral Q6H PRN Lorella Nimrod, MD       Or  . acetaminophen (TYLENOL) suppository 650 mg  650 mg Rectal Q6H PRN Lorella Nimrod, MD      . budesonide (PULMICORT) nebulizer solution 0.25 mg  0.25 mg Nebulization BID Tyler Pita, MD   0.25 mg at 08/14/19 2002  . Chlorhexidine Gluconate Cloth 2 % PADS 6 each  6 each Topical Daily Lorella Nimrod, MD   6 each at 08/14/19 1045  . chlorpheniramine-HYDROcodone (TUSSIONEX) 10-8 MG/5ML suspension 5 mL  5 mL Oral Q12H Lorella Nimrod, MD   5 mL at 08/14/19 1043  . cholecalciferol (VITAMIN D3) tablet 1,000 Units  1,000 Units Oral QHS Lorella Nimrod, MD      . enoxaparin (LOVENOX) injection 40 mg  40 mg Subcutaneous Q24H Lorella Nimrod, MD   40 mg at 08/13/19 2247  . ferrous sulfate tablet 325 mg  325 mg Oral QHS Amin, Soundra Pilon, MD      . furosemide (LASIX) injection 40 mg  40 mg Intravenous Daily Charlett Nose, RPH   40 mg at 08/14/19 1656  . guaiFENesin-dextromethorphan (ROBITUSSIN DM) 100-10 MG/5ML syrup 15 mL  15 mL Oral Q4H PRN Darel Hong D, NP      . HYDROcodone-acetaminophen (NORCO/VICODIN) 5-325 MG per tablet 1-2 tablet  1-2 tablet Oral Q6H PRN Darel Hong D, NP      . ipratropium-albuterol (DUONEB) 0.5-2.5 (3) MG/3ML nebulizer solution 3 mL  3 mL Nebulization Q6H Flora Lipps, MD   3 mL at 08/14/19 2002  . levalbuterol (XOPENEX) nebulizer solution 1.25 mg  1.25 mg Nebulization Q6H PRN Darel Hong D, NP      . methylPREDNISolone sodium succinate (SOLU-MEDROL) 40 mg/mL injection 20 mg  20 mg Intravenous Q12H Charlett Nose, RPH   20 mg at 08/14/19 1923  . [START ON 08/15/2019] multivitamin with minerals tablet 1 tablet  1 tablet Oral Daily Flora Lipps, MD      . Derrill Memo ON 08/15/2019] omega-3 acid ethyl esters (LOVAZA) capsule 1 g  1 g Oral Daily Mortimer Fries, Kurian, MD      . ondansetron (ZOFRAN) tablet 4 mg  4 mg Oral Q6H PRN Lorella Nimrod, MD       Or  . ondansetron (ZOFRAN) injection 4 mg  4 mg Intravenous Q6H PRN Lorella Nimrod, MD      . Derrill Memo ON 08/15/2019] pantoprazole (PROTONIX) EC tablet 40 mg  40 mg Oral Daily Kasa, Kurian, MD      . polyethylene glycol (MIRALAX / GLYCOLAX) packet 17 g  17 g Oral Daily PRN Lorella Nimrod, MD      . sodium chloride flush (NS) 0.9 % injection 3 mL  3 mL Intravenous Q12H Lorella Nimrod, MD   3 mL at 08/14/19 1044  . [START ON 08/15/2019] sulfamethoxazole-trimethoprim (BACTRIM DS) 800-160 MG per tablet 1 tablet  1 tablet Oral Once per day on Mon Wed Fri Kasa, Kurian, MD          .  PHYSICAL EXAMINATION:  Vitals:   08/14/19 1900 08/14/19 2002  BP:    Pulse: (!) 107   Resp: (!) 21   Temp:    SpO2: 94% 96%   Filed Weights   08/13/19 1502 08/13/19 2030  Weight: 205 lb 4 oz (93.1 kg) 197 lb 15.6 oz (89.8 kg)    Physical Exam  Constitutional: She is oriented to person, place, and time.  Obese.  Tachypneic.  BiPAP.  HENT:  Head:  Normocephalic and atraumatic.  Mouth/Throat: No oropharyngeal exudate.  Eyes: Pupils are equal, round, and reactive to light.  Cardiovascular: Normal rate and regular rhythm.  Pulmonary/Chest: No respiratory distress. She has no wheezes.  Decreased air entry bilaterally.   Abdominal: Soft. Bowel sounds are normal. She exhibits no distension and no mass. There is no abdominal tenderness. There is no rebound and no guarding.  Musculoskeletal:        General: No tenderness or edema. Normal range of motion.     Cervical back: Normal range of motion and neck supple.  Neurological: She is alert and oriented to person, place, and time.  Skin: Skin is warm.  Psychiatric: Affect normal.     LABORATORY DATA:  I have reviewed the data as listed Lab Results  Component Value Date   WBC 10.4 08/14/2019   HGB 12.6 08/14/2019   HCT 39.2 08/14/2019   MCV 90.3 08/14/2019   PLT 256 08/14/2019   Recent Labs    06/05/19 0933 06/05/19 0933 08/05/19 2230 08/05/19 2230 08/07/19 0601 08/13/19 1502 08/14/19 0517  NA 140   < > 142   < > 141 138 140  K 3.3*   < > 4.0   < > 3.9 4.3 4.0  CL 108   < > 105   < > 107 105 103  CO2 23   < > 25   < > 25 20* 25  GLUCOSE 111*   < > 116*   < > 93 152* 130*  BUN 19   < > 18   < > 19 26* 20  CREATININE  0.63   < > 0.58   < > 0.55 0.71 0.78  CALCIUM 8.6*   < > 9.1   < > 9.0 9.6 9.7  GFRNONAA >60   < > >60   < > >60 >60 >60  GFRAA >60   < > >60   < > >60 >60 >60  PROT 5.8*  --  7.0  --   --  6.9  --   ALBUMIN 3.4*  --  3.9  --   --  3.9  --   AST 17  --  21  --   --  30  --   ALT 23  --  20  --   --  39  --   ALKPHOS 80  --  140*  --   --  111  --   BILITOT 0.3  --  0.5  --   --  0.6  --    < > = values in this interval not displayed.    RADIOGRAPHIC STUDIES: I have personally reviewed the radiological images as listed and agreed with the findings in the report. CT Angio Chest PE W and/or Wo Contrast  Result Date: 08/06/2019 CLINICAL DATA:  Hemoptysis,  history of breast cancer and COVID EXAM: CT ANGIOGRAPHY CHEST WITH CONTRAST TECHNIQUE: Multidetector CT imaging of the chest was performed using the standard protocol during bolus administration of intravenous contrast. Multiplanar CT image reconstructions and MIPs were obtained to evaluate the vascular anatomy. CONTRAST:  35mL OMNIPAQUE IOHEXOL 350 MG/ML SOLN COMPARISON:  July 09, 2019 FINDINGS: Cardiovascular: There is a optimal opacification of the pulmonary arteries. There is no central,segmental, or subsegmental filling defects within the pulmonary arteries. A left-sided MediPort catheter is seen with the tip in the right atrium. The heart is normal in size. No pericardial effusion or thickening. No evidence right heart strain. There is normal three-vessel brachiocephalic anatomy without proximal stenosis. Scattered mild aortic atherosclerosis. Mediastinum/Nodes: No hilar, mediastinal, or axillary adenopathy. Thyroid gland, trachea, and esophagus demonstrate no significant findings. Lungs/Pleura: There is new/worsening multifocal patchy predominantly peripherally based ground-glass opacities seen throughout both lungs. There is also slight interval worsening in the tree-in-bud nodular hazy opacities within the left lung apex. There is unchanged spiculated masslike consolidation within the right infrahilar region, likely from post treatment changes/fibrosis. There is a trace right pleural effusion present. Upper Abdomen: No acute abnormalities present in the visualized portions of the upper abdomen. Musculoskeletal: No chest wall abnormality. No acute or significant osseous findings. Again noted is diffuse skin thickening seen along the right breast. There is a surgical clips within the right breast. Review of the MIP images confirms the above findings. IMPRESSION: 1. No central, segmental, or subsegmental pulmonary embolism. 2. Interval worsening of the multifocal patchy ground-glass opacities throughout  both lungs, consistent with multifocal pneumonia. 3. Interval slight worsening in the tree-in-bud nodular opacities within the left upper lobe, likely due to atypical infectious etiology, however cannot exclude underlying metastatic disease. 4. Post treatment radiation/fibrotic changes in the right infrahilar region. 5. Trace right pleural effusion Electronically Signed   By: Prudencio Pair M.D.   On: 08/06/2019 00:17   DG Chest Port 1 View  Result Date: 08/14/2019 CLINICAL DATA:  Acute respiratory failure. EXAM: PORTABLE CHEST 1 VIEW COMPARISON:  Chest x-ray 08/13/2019 FINDINGS: The left subclavian power port is stable. Stable radiation changes involving the right hilum and right paramediastinal lung. Persistent patchy nodular infiltrates and small right effusion. No pneumothorax. IMPRESSION: Stable chest x-ray. Persistent  patchy nodular infiltrates and small right effusion. Electronically Signed   By: Marijo Sanes M.D.   On: 08/14/2019 06:19   DG Chest Port 1 View  Result Date: 08/13/2019 CLINICAL DATA:  Shortness of breath, tachypnea with decreased oxygen saturations., history of breast cancer. EXAM: PORTABLE CHEST 1 VIEW COMPARISON:  08/06/2019, CT of the chest. FINDINGS: Masslike appearance of right hilum similar to previous exam. Cardiomediastinal contours are stable. Left Port-A-Cath terminates at the caval to atrial junction. Nodular opacities are again suggested and there is increase in interstitial prominence when compared to the chest x-ray from 08/05/2019. Blunting of right costophrenic angles suggest scarring or small effusion. No signs of dense consolidation aside from perihilar changes discussed above. Visualized skeletal structures without acute bone finding. IMPRESSION: Masslike appearance of right hilum similar to previous exam. Nodular opacities are again suggested and there is increase in interstitial prominence when compared to the chest x-ray from 08/05/2019. Findings may represent  worsening of pulmonary parenchymal disease, associated with previously reported COVID-19 infection, particularly in the left lung base, potentially with a background of pulmonary edema. Electronically Signed   By: Zetta Bills M.D.   On: 08/13/2019 15:26   DG Chest Portable 1 View  Result Date: 08/05/2019 CLINICAL DATA:  59 year old female with hemoptysis. History of breast cancer. EXAM: PORTABLE CHEST 1 VIEW COMPARISON:  Chest radiograph dated 04/09/2017. CT dated 07/09/2019. FINDINGS: Port-A-Cath with tip in the region of the cavoatrial junction. Right hilar density corresponding to the post treatment/post radiation fibrosis seen on the prior CT. Diffuse interstitial and vascular prominence with scattered faint reticulonodular densities throughout the lungs. Probable small bilateral pleural effusions. No pneumothorax. The cardiac silhouette is within normal limits. Atherosclerotic calcification of the aorta. No acute osseous pathology. IMPRESSION: Right hilar post radiation fibrosis and scattered bilateral reticulonodular densities and small bilateral pleural effusions. Overall findings are similar to the CT of 07/09/2019. Electronically Signed   By: Anner Crete M.D.   On: 08/05/2019 22:59   ECHOCARDIOGRAM COMPLETE  Result Date: 08/14/2019    ECHOCARDIOGRAM REPORT   Patient Name:   ADA OLIVEROS Date of Exam: 08/14/2019 Medical Rec #:  [id number]          Height:       63.0 in Accession #:    [id number]         Weight:       198.0 lb Date of Birth:  [birth year]         BSA:          1.925 m Patient Age:    51 years           BP:           105/71 mmHg Patient Gender: F                  HR:           95 bpm. Exam Location:  [facility] Procedure: 2D Echo, Cardiac Doppler and Color Doppler Indications:     Dyspnea 786.09  History:         Patient has prior history of Echocardiogram examinations, most                  recent 04/22/2019. Breast cancer, covid-19, history of                  chemotherapy.   Sonographer:     [name] RDCS (AE) Referring Phys:  [id number] [facility] AMIN Diagnosing Phys: Serafina Royals MD  Sonographer Comments: Suboptimal apical window. IMPRESSIONS  1. Left ventricular ejection fraction, by estimation, is 55 to 60%. The left ventricle has normal function. The left ventricle has no regional wall motion abnormalities. Left ventricular diastolic parameters were normal.  2. Right ventricular systolic function is normal. The right ventricular size is normal. There is normal pulmonary artery systolic pressure.  3. The mitral valve is normal in structure. Trivial mitral valve regurgitation.  4. The aortic valve is normal in structure. Aortic valve regurgitation is not visualized. FINDINGS  Left Ventricle: Left ventricular ejection fraction, by estimation, is 55 to 60%. The left ventricle has normal function. The left ventricle has no regional wall motion abnormalities. The left ventricular internal cavity size was normal in size. There is  no left ventricular hypertrophy. Left ventricular diastolic parameters were normal. Right Ventricle: The right ventricular size is normal. No increase in right ventricular wall thickness. Right ventricular systolic function is normal. There is normal pulmonary artery systolic pressure. The tricuspid regurgitant velocity is 1.87 m/s, and  with an assumed right atrial pressure of 10 mmHg, the estimated right ventricular systolic pressure is 0000000 mmHg. Left Atrium: Left atrial size was normal in size. Right Atrium: Right atrial size was normal in size. Pericardium: There is no evidence of pericardial effusion. Mitral Valve: The mitral valve is normal in structure. Trivial mitral valve regurgitation. Tricuspid Valve: The tricuspid valve is normal in structure. Tricuspid valve regurgitation is trivial. Aortic Valve: The aortic valve is normal in structure. Aortic valve regurgitation is not visualized. Aortic valve mean gradient measures 2.0 mmHg. Aortic valve peak  gradient measures 3.3 mmHg. Aortic valve area, by VTI measures 3.78 cm. Pulmonic Valve: The pulmonic valve was normal in structure. Pulmonic valve regurgitation is not visualized. Aorta: The aortic root and ascending aorta are structurally normal, with no evidence of dilitation. IAS/Shunts: No atrial level shunt detected by color flow Doppler.  LEFT VENTRICLE PLAX 2D LVIDd:         3.86 cm  Diastology LVIDs:         2.43 cm  LV e' lateral:   9.79 cm/s LV PW:         0.82 cm  LV E/e' lateral: 6.0 LV IVS:        0.82 cm  LV e' medial:    6.96 cm/s LVOT diam:     2.00 cm  LV E/e' medial:  8.4 LV SV:         44 LV SV Index:   23 LVOT Area:     3.14 cm  RIGHT VENTRICLE RV Basal diam:  3.94 cm TAPSE (M-mode): 3.5 cm LEFT ATRIUM           Index       RIGHT ATRIUM           Index LA diam:      2.00 cm 1.04 cm/m  RA Area:     19.40 cm LA Vol (A2C): 24.8 ml 12.88 ml/m RA Volume:   58.80 ml  30.54 ml/m LA Vol (A4C): 24.8 ml 12.88 ml/m  AORTIC VALVE                   PULMONIC VALVE AV Area (Vmax):    3.11 cm    PV Vmax:        0.48 m/s AV Area (Vmean):   3.48 cm    PV Peak grad:   0.9 mmHg AV Area (VTI):     3.78 cm  RVOT Peak grad: 1 mmHg AV Vmax:           90.35 cm/s AV Vmean:          62.550 cm/s AV VTI:            0.115 m AV Peak Grad:      3.3 mmHg AV Mean Grad:      2.0 mmHg LVOT Vmax:         89.40 cm/s LVOT Vmean:        69.200 cm/s LVOT VTI:          0.139 m LVOT/AV VTI ratio: 1.20  AORTA Ao Root diam: 2.50 cm MITRAL VALVE               TRICUSPID VALVE MV Area (PHT): 8.62 cm    TR Peak grad:   14.0 mmHg MV Decel Time: 88 msec     TR Vmax:        187.00 cm/s MV E velocity: 58.70 cm/s MV A velocity: 86.80 cm/s  SHUNTS MV E/A ratio:  0.68        Systemic VTI:  0.14 m                            Systemic Diam: 2.00 cm Serafina Royals MD Electronically signed by Serafina Royals MD Signature Date/Time: 08/14/2019/1:54:24 PM    Final     Malignant neoplasm of upper-outer quadrant of right breast in female,  estrogen receptor negative ([place]) #59 year old female patient with history of metastatic breast cancer currently admitted to hospital for acute respiratory failure  #Metastatic triple negative breast cancer-most recently gemcitabine; currently on hold because of acute issues.  #Acute respiratory failure-needing BiPAP support.  The etiology of respiratory failure is unclear-differential diagnosis includes pneumonitis-infectious [less likely] versus inflammatory [question gemcitabine versus others-recent Covid infection] versus fluid overload.  Lymphangitic spread of breast cancer is also consideration.  However as per my discussion with Dr. Mortimer Fries patient's respiratory status quite tenuous-contraindicating diagnostic procedure like bronchoscopy.  Agree with treatment approach with steroids/Lasix/antibiotics for above diagnostic possibilities.   #Discussed CODE STATUS-patient interested in full code at this time.  Agree with palliative care evaluation.  #Discussed with patient's husband, Gwyndolyn Saxon at length above diagnostic/therapeutic considerations and patient's overall guarded prognosis.   Thank you Dr.Kasa for allowing me to participate in the care of your pleasant patient. Please do not hesitate to contact me with questions or concerns in the interim.  All questions were answered. The patient knows to call the clinic with any problems, questions or concerns.    Cammie Sickle, MD 08/14/2019 8:26 PM

## 2019-08-14 NOTE — Progress Notes (Signed)
PROGRESS NOTE    Ashley Molina  [id number] DOB: [birth year] DOA: 08/13/2019 PCP: Patient, No Pcp Per   Brief Narrative:   Ashley Molina is a 59 y.o. female with medical history significant of Ashley Molina y.o.Caucasianfemalewith a known history of COVID-19 05/12/2019,[name] ,and right breast cancer with lung metastasiss/plumpectomy, chemotherapy and radiotherapy, who presented to the emergency room with worsening dyspnea. Recently admitted from 3/2 bottles 08/07/2019 with worsening shortness of breath and hemoptysis.  CT chest with disease progression.  She was sent home with continuous oxygen along with steroid and low-dose Lasix.. Patient was not using Lasix at home  due to increased urination.  There was some concern for pneumonitis secondary to Covid and gemcitabine.  She was supposed to follow-up with pulmonary as an outpatient.  Recently prednisone dose was increased to 40 mg daily until she sees her pulmonologist on 08/26/2019. Since prior to discharge patient continued to experience worsening dyspnea.  Increased her home oxygen to 4 L with no response.  Subjective: Patient was feeling little better when seen today.  She was complaining of worsening dry cough.  She was getting her echo.  Assessment & Plan:   Active Problems:   Respiratory failure (HCC)  Acute on chronic hypoxic respiratory failure.  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  She was also on gemcitabine which can also contribute to pneumonitis according to her pulmonologist. Repeat echocardiogram within normal limit.  Lactic acidosis resolved.  PCT<0.1 She was started on Solu-Medrol 40 mg twice daily and responded well.  Off the BiPAP now. -Continue Solu-Medrol 40 mg twice daily. -Add Tussionex twice daily for worsening cough. -Continue Lasix 40 mg daily. -Pulmonary  was consulted-appreciate their recommendations. -Continue monitoring BMP and daily weight. -Strict intake and output.  Elevated troponin.  Most likely secondary to demand.  She also appears volume up.  Elevated BNP at 602.  Troponin curve flat, remained in 200. Repeat echocardiogram within normal limit. -Continue trending troponin till normalizes.  Leukocytosis.  Most likely steroid-induced.  Seems improving.  Patient remained afebrile and procalcitonin negative. -No need for antibiotics at this time. -Continue to monitor.  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  Patient with disease progression and recurrence of pulmonary mets. There was some concern about worsening pneumonitis due to gemcitabine.  Recently saw her oncologist Dr. Astrid Divine on 08/11/2019.  She does not think that gemcitabine is causing her pneumonitis.  She is also running out of her options. -Palliative consult to discuss goals of care and symptom management.  Patient does not want to give up.  Objective: Vitals:   08/14/19 0400 08/14/19 0500 08/14/19 0800 08/14/19 0836  BP: 101/67 98/74  105/71  Pulse: 95 95 89 95  Resp: (!) 21 (!) 21 20 (!) 22  Temp:   98.7 F (37.1 C) 98.7 F (37.1 C)  TempSrc:      SpO2: 98% 98% 99% 96%  Weight:      Height:        Intake/Output Summary (Last 24 hours) at 08/14/2019 1518 Last data filed at 08/14/2019 1044 Gross per 24 hour  Intake 12 ml  Output 1100 ml  Net -1088 ml   Filed Weights   08/13/19 1502 08/13/19 2030  Weight: 93.1 kg 89.8 kg    Examination:  General exam: Appears calm and comfortable  Respiratory system: Bilateral scattered crackles and wheeze,  respiratory effort normal. Cardiovascular system: S1 & S2 heard, RRR. No JVD, murmurs, rubs, gallops or clicks. Gastrointestinal system: Soft, nontender, nondistended, bowel sounds positive. Central nervous system: Alert and oriented. No focal neurological deficits.Symmetric 5 x 5  power. Extremities: 1+ LE edema, no cyanosis, pulses intact and symmetrical. Skin: No rashes, lesions or ulcers Psychiatry: Judgement and insight appear normal.     DVT prophylaxis: Lovenox Code Status: Full Family Communication: Discussed with patient Disposition Plan: Pending improvement.  Patient wants to go back home.  Patient is high risk for deterioration and death due to her advanced stage IV breast cancer and worsening respiratory status.  Palliative care was consulted.  Consultants:   Pulmonary  Palliative care  Oncology  Procedures:  Antimicrobials:   Data Reviewed: I have personally reviewed following labs and imaging studies  CBC: Recent Labs  Lab 08/13/19 1502 08/14/19 0517  WBC 17.1* 10.4  NEUTROABS 15.7*  --   HGB 12.9 12.6  HCT 41.4 39.2  MCV 92.2 90.3  PLT 307 [id number]   Basic Metabolic Panel: Recent Labs  Lab 08/13/19 1502 08/14/19 0517  NA 138 140  K 4.3 4.0  CL 105 103  CO2 20* 25  GLUCOSE 152* 130*  BUN 26* 20  CREATININE 0.71 0.78  CALCIUM 9.6 9.7   GFR: Estimated Creatinine Clearance: 79.6 mL/min (by C-G formula based on SCr of 0.78 mg/dL). Liver Function Tests: Recent Labs  Lab 08/13/19 1502  AST 30  ALT 39  ALKPHOS 111  BILITOT 0.6  PROT 6.9  ALBUMIN 3.9   No results for input(s): LIPASE, AMYLASE in the last 168 hours. No results for input(s): AMMONIA in the last 168 hours. Coagulation Profile: No results for input(s): INR, PROTIME in the last 168 hours. Cardiac Enzymes: No results for input(s): CKTOTAL, CKMB, CKMBINDEX, TROPONINI in the last 168 hours. BNP (last 3 results) No results for input(s): PROBNP in the last 8760 hours. HbA1C: No results for input(s): HGBA1C in the last 72 hours. CBG: Recent Labs  Lab 08/13/19 2035  GLUCAP 137*   Lipid Profile: No results for input(s): CHOL, HDL, LDLCALC, TRIG, CHOLHDL, LDLDIRECT in the last 72 hours. Thyroid Function Tests: No results for input(s): TSH, T4TOTAL, FREET4,  T3FREE, THYROIDAB in the last 72 hours. Anemia Panel: No results for input(s): VITAMINB12, FOLATE, FERRITIN, TIBC, IRON, RETICCTPCT in the last 72 hours. Sepsis Labs: Recent Labs  Lab 08/13/19 1502 08/13/19 2112 08/14/19 0739 08/14/19 0811  PROCALCITON  --  <0.10  --   --   LATICACIDVEN 2.1* 2.0* 1.4 1.5    Recent Results (from the past 240 hour(s))  Blood Culture (routine x 2)     Status: None   Collection Time: 08/06/19  1:00 AM   Specimen: BLOOD  Result Value Ref Range Status   Specimen Description BLOOD RIGHT HAND  Final   Special Requests   Final    BOTTLES DRAWN AEROBIC AND ANAEROBIC Blood Culture results may not be optimal due to an inadequate volume of blood received in culture bottles   Culture   Final    NO GROWTH 5 DAYS Performed at [facility], [address] [place], [place] [address]    Report Status 08/11/2019 FINAL  Final  Blood Culture (routine x 2)     Status: None   Collection Time: 08/06/19  1:01 AM   Specimen: BLOOD  Result Value Ref Range Status   Specimen Description BLOOD RIGHT ARM  Final   Special Requests   Final  BOTTLES DRAWN AEROBIC AND ANAEROBIC Blood Culture results may not be optimal due to an inadequate volume of blood received in culture bottles   Culture   Final    NO GROWTH 5 DAYS Performed at [facility], [address] [place], [place] [address]    Report Status 08/11/2019 FINAL  Final  Blood Culture (routine x 2)     Status: None (Preliminary result)   Collection Time: 08/13/19  3:02 PM   Specimen: BLOOD  Result Value Ref Range Status   Specimen Description BLOOD BLOOD LEFT HAND  Final   Special Requests   Final    BOTTLES DRAWN AEROBIC AND ANAEROBIC Blood Culture adequate volume   Culture   Final    NO GROWTH < 24 HOURS Performed at [facility], [address] [place], [place] [address]    Report Status PENDING  Incomplete  Blood Culture (routine x 2)     Status: None (Preliminary result)     Collection Time: 08/13/19  3:07 PM   Specimen: BLOOD  Result Value Ref Range Status   Specimen Description BLOOD LEFT ANTECUBITAL  Final   Special Requests   Final    BOTTLES DRAWN AEROBIC AND ANAEROBIC Blood Culture adequate volume   Culture   Final    NO GROWTH < 24 HOURS Performed at [facility], [address], [place], [place] [address]    Report Status PENDING  Incomplete  SARS CORONAVIRUS 2 (TAT 6-24 HRS) Nasopharyngeal Nasopharyngeal Swab     Status: None   Collection Time: 08/13/19  8:16 PM   Specimen: Nasopharyngeal Swab  Result Value Ref Range Status   SARS Coronavirus 2 NEGATIVE NEGATIVE Final    Comment: (NOTE) SARS-CoV-2 target nucleic acids are NOT DETECTED. The SARS-CoV-2 RNA is generally detectable in upper and lower respiratory specimens during the acute phase of infection. Negative results do not preclude SARS-CoV-2 infection, do not rule out co-infections with other pathogens, and should not be used as the sole basis for treatment or other patient management decisions. Negative results must be combined with clinical observations, patient history, and epidemiological information. The expected result is Negative. Fact Sheet for Patients: SugarRoll.be Fact Sheet for Healthcare Providers: https://www.woods-mathews.com/ This test is not yet approved or cleared by the [place] FDA and  has been authorized for detection and/or diagnosis of SARS-CoV-2 by FDA under an Emergency Use Authorization (EUA). This EUA will remain  in effect (meaning this test can be used) for the duration of the COVID-19 declaration under Section 56 4(b)(1) of the Act, 21 U.S.C. section 360bbb-3(b)(1), unless the authorization is terminated or revoked sooner. Performed at [facility], [place] [address] [place], [place] [address]   MRSA PCR Screening     Status: None   Collection Time: 08/13/19 11:28 PM   Specimen:  Nasopharyngeal  Result Value Ref Range Status   MRSA by PCR NEGATIVE NEGATIVE Final    Comment:        The GeneXpert MRSA Assay (FDA approved for NASAL specimens only), is one component of a comprehensive MRSA colonization surveillance program. It is not intended to diagnose MRSA infection nor to guide or monitor treatment for MRSA infections. Performed at [facility], [address] [place], [place] [address]      Radiology Studies: [facility] Chest [place] 1 View  Result Date: 08/14/2019 CLINICAL DATA:  Acute respiratory failure. EXAM: PORTABLE CHEST 1 VIEW COMPARISON:  Chest x-ray 08/13/2019 FINDINGS: The left subclavian power port  is stable. Stable radiation changes involving the right hilum and right paramediastinal lung. Persistent patchy nodular infiltrates and small right effusion. No pneumothorax. IMPRESSION: Stable chest x-ray. Persistent patchy nodular infiltrates and small right effusion. Electronically Signed   By: Marijo Sanes M.D.   On: 08/14/2019 06:19   DG Chest Port 1 View  Result Date: 08/13/2019 CLINICAL DATA:  Shortness of breath, tachypnea with decreased oxygen saturations., history of breast cancer. EXAM: PORTABLE CHEST 1 VIEW COMPARISON:  08/06/2019, CT of the chest. FINDINGS: Masslike appearance of right hilum similar to previous exam. Cardiomediastinal contours are stable. Left Port-A-Cath terminates at the caval to atrial junction. Nodular opacities are again suggested and there is increase in interstitial prominence when compared to the chest x-ray from 08/05/2019. Blunting of right costophrenic angles suggest scarring or small effusion. No signs of dense consolidation aside from perihilar changes discussed above. Visualized skeletal structures without acute bone finding. IMPRESSION: Masslike appearance of right hilum similar to previous exam. Nodular opacities are again suggested and there is increase in interstitial prominence when compared to the chest x-ray  from 08/05/2019. Findings may represent worsening of pulmonary parenchymal disease, associated with previously reported COVID-19 infection, particularly in the left lung base, potentially with a background of pulmonary edema. Electronically Signed   By: Zetta Bills M.D.   On: 08/13/2019 15:26   ECHOCARDIOGRAM COMPLETE  Result Date: 08/14/2019    ECHOCARDIOGRAM REPORT   Patient Name:   Ashley Molina Date of Exam: 08/14/2019 Medical Rec #:  [id number]          Height:       63.0 in Accession #:    [id number]         Weight:       198.0 lb Date of Birth:  Apr 29, 1961         BSA:          1.925 m Patient Age:    25 years           BP:           105/71 mmHg Patient Gender: F                  HR:           95 bpm. Exam Location:  [facility] Procedure: 2D Echo, Cardiac Doppler and Color Doppler Indications:     Dyspnea 786.09  History:         Patient has prior history of Echocardiogram examinations, most                  recent 04/22/2019. Breast cancer, covid-19, history of                  chemotherapy.  Sonographer:     [name] RDCS (AE) Referring Phys:  [id number] [facility] Garth Diffley Diagnosing Phys: Serafina Royals MD  Sonographer Comments: Suboptimal apical window. IMPRESSIONS  1. Left ventricular ejection fraction, by estimation, is 55 to 60%. The left ventricle has normal function. The left ventricle has no regional wall motion abnormalities. Left ventricular diastolic parameters were normal.  2. Right ventricular systolic function is normal. The right ventricular size is normal. There is normal pulmonary artery systolic pressure.  3. The mitral valve is normal in structure. Trivial mitral valve regurgitation.  4. The aortic valve is normal in structure. Aortic valve regurgitation is not visualized. FINDINGS  Left Ventricle: Left ventricular ejection fraction, by estimation, is 55 to 60%. The left ventricle has normal function. The  left ventricle has no regional wall motion abnormalities. The left ventricular  internal cavity size was normal in size. There is  no left ventricular hypertrophy. Left ventricular diastolic parameters were normal. Right Ventricle: The right ventricular size is normal. No increase in right ventricular wall thickness. Right ventricular systolic function is normal. There is normal pulmonary artery systolic pressure. The tricuspid regurgitant velocity is 1.87 m/s, and  with an assumed right atrial pressure of 10 mmHg, the estimated right ventricular systolic pressure is 0000000 mmHg. Left Atrium: Left atrial size was normal in size. Right Atrium: Right atrial size was normal in size. Pericardium: There is no evidence of pericardial effusion. Mitral Valve: The mitral valve is normal in structure. Trivial mitral valve regurgitation. Tricuspid Valve: The tricuspid valve is normal in structure. Tricuspid valve regurgitation is trivial. Aortic Valve: The aortic valve is normal in structure. Aortic valve regurgitation is not visualized. Aortic valve mean gradient measures 2.0 mmHg. Aortic valve peak gradient measures 3.3 mmHg. Aortic valve area, by VTI measures 3.78 cm. Pulmonic Valve: The pulmonic valve was normal in structure. Pulmonic valve regurgitation is not visualized. Aorta: The aortic root and ascending aorta are structurally normal, with no evidence of dilitation. IAS/Shunts: No atrial level shunt detected by color flow Doppler.  LEFT VENTRICLE PLAX 2D LVIDd:         3.86 cm  Diastology LVIDs:         2.43 cm  LV e' lateral:   9.79 cm/s LV PW:         0.82 cm  LV E/e' lateral: 6.0 LV IVS:        0.82 cm  LV e' medial:    6.96 cm/s LVOT diam:     2.00 cm  LV E/e' medial:  8.4 LV SV:         44 LV SV Index:   23 LVOT Area:     3.14 cm  RIGHT VENTRICLE RV Basal diam:  3.94 cm TAPSE (M-mode): 3.5 cm LEFT ATRIUM           Index       RIGHT ATRIUM           Index LA diam:      2.00 cm 1.04 cm/m  RA Area:     19.40 cm LA Vol (A2C): 24.8 ml 12.88 ml/m RA Volume:   58.80 ml  30.54 ml/m LA Vol (A4C):  24.8 ml 12.88 ml/m  AORTIC VALVE                   PULMONIC VALVE AV Area (Vmax):    3.11 cm    PV Vmax:        0.48 m/s AV Area (Vmean):   3.48 cm    PV Peak grad:   0.9 mmHg AV Area (VTI):     3.78 cm    RVOT Peak grad: 1 mmHg AV Vmax:           90.35 cm/s AV Vmean:          62.550 cm/s AV VTI:            0.115 m AV Peak Grad:      3.3 mmHg AV Mean Grad:      2.0 mmHg LVOT Vmax:         89.40 cm/s LVOT Vmean:        69.200 cm/s LVOT VTI:          0.139 m LVOT/AV VTI ratio: 1.20  AORTA Ao Root diam: 2.50 cm MITRAL VALVE               TRICUSPID VALVE MV Area (PHT): 8.62 cm    TR Peak grad:   14.0 mmHg MV Decel Time: 88 msec     TR Vmax:        187.00 cm/s MV E velocity: 58.70 cm/s MV A velocity: 86.80 cm/s  SHUNTS MV E/A ratio:  0.68        Systemic VTI:  0.14 m                            Systemic Diam: 2.00 cm Serafina Royals MD Electronically signed by Serafina Royals MD Signature Date/Time: 08/14/2019/1:54:24 PM    Final     Scheduled Meds: . budesonide (PULMICORT) nebulizer solution  0.25 mg Nebulization BID  . Chlorhexidine Gluconate Cloth  6 each Topical Daily  . chlorpheniramine-HYDROcodone  5 mL Oral Q12H  . cholecalciferol  1,000 Units Oral QHS  . enoxaparin (LOVENOX) injection  40 mg Subcutaneous Q24H  . ferrous sulfate  325 mg Oral QHS  . furosemide  40 mg Intravenous Daily  . ipratropium-albuterol  3 mL Nebulization Q6H  . methylPREDNISolone (SOLU-MEDROL) injection  20 mg Intravenous Q12H  . [START ON 08/15/2019] multivitamin with minerals  1 tablet Oral Daily  . [START ON 08/15/2019] omega-3 acid ethyl esters  1 g Oral Daily  . [START ON 08/15/2019] pantoprazole  40 mg Oral Daily  . sodium chloride flush  3 mL Intravenous Q12H  . [START ON 08/15/2019] sulfamethoxazole-trimethoprim  1 tablet Oral Once per day on Mon Wed Fri   Continuous Infusions:   LOS: 1 day   Time spent: 45 minutes.  Lorella Nimrod, MD [facility]  If 7PM-7AM, please contact  night-coverage [email or web address]  08/14/2019, 3:18 PM   This record has been created using Systems analyst. Errors have been sought and corrected,but may not always be located. Such creation errors do not reflect on the standard of care.

## 2019-08-15 DIAGNOSIS — J9621 Acute and chronic respiratory failure with hypoxia: Principal | ICD-10-CM

## 2019-08-15 DIAGNOSIS — J189 Pneumonia, unspecified organism: Secondary | ICD-10-CM

## 2019-08-15 DIAGNOSIS — J810 Acute pulmonary edema: Secondary | ICD-10-CM

## 2019-08-15 DIAGNOSIS — C50411 Malignant neoplasm of upper-outer quadrant of right female breast: Secondary | ICD-10-CM

## 2019-08-15 DIAGNOSIS — Z7189 Other specified counseling: Secondary | ICD-10-CM

## 2019-08-15 DIAGNOSIS — Z515 Encounter for palliative care: Secondary | ICD-10-CM

## 2019-08-15 DIAGNOSIS — C7802 Secondary malignant neoplasm of left lung: Secondary | ICD-10-CM

## 2019-08-15 LAB — BASIC METABOLIC PANEL
Anion gap: 10 (ref 5–15)
BUN: 32 mg/dL — ABNORMAL HIGH (ref 6–20)
CO2: 27 mmol/L (ref 22–32)
Calcium: 9.5 mg/dL (ref 8.9–10.3)
Chloride: 99 mmol/L (ref 98–111)
Creatinine, Ser: 0.89 mg/dL (ref 0.44–1.00)
GFR calc Af Amer: >60 mL/min (ref >60)
GFR calc non Af Amer: >60 mL/min (ref >60)
Glucose, Bld: 129 mg/dL — ABNORMAL HIGH (ref 70–99)
Potassium: 3.6 mmol/L (ref 3.5–5.1)
Sodium: 136 mmol/L (ref 135–145)

## 2019-08-15 LAB — CBC
HCT: 38 % (ref 36.0–46.0)
Hemoglobin: 11.7 g/dL — ABNORMAL LOW (ref 12.0–15.0)
MCH: 28.3 pg (ref 26.0–34.0)
MCHC: 30.8 g/dL (ref 30.0–36.0)
MCV: 92 fL (ref 80.0–100.0)
Platelets: 201 10*3/uL (ref 150–400)
RBC: 4.13 MIL/uL (ref 3.87–5.11)
RDW: 16 % — ABNORMAL HIGH (ref 11.5–15.5)
WBC: 10 10*3/uL (ref 4.0–10.5)
nRBC: 0 % (ref 0.0–0.2)

## 2019-08-15 LAB — MAGNESIUM: Magnesium: 2.1 mg/dL (ref 1.7–2.4)

## 2019-08-15 MED ORDER — METOPROLOL TARTRATE 25 MG PO TABS
12.5000 mg | ORAL_TABLET | Freq: Two times a day (BID) | ORAL | Status: DC
  Administered 2019-08-15 – 2019-08-16 (×3): 12.5 mg via ORAL
  Filled 2019-08-15 (×3): qty 1

## 2019-08-15 MED ORDER — BUDESONIDE 0.5 MG/2ML IN SUSP
0.5000 mg | Freq: Two times a day (BID) | RESPIRATORY_TRACT | Status: DC
  Administered 2019-08-15 – 2019-08-16 (×3): 0.5 mg via RESPIRATORY_TRACT
  Filled 2019-08-15 (×3): qty 2

## 2019-08-15 MED ORDER — POTASSIUM CHLORIDE CRYS ER 20 MEQ PO TBCR
40.0000 meq | EXTENDED_RELEASE_TABLET | Freq: Once | ORAL | Status: AC
  Administered 2019-08-15: 40 meq via ORAL
  Filled 2019-08-15: qty 2

## 2019-08-15 NOTE — TOC Initial Note (Signed)
Transition of Care [facility]) - Initial/Assessment Note    Patient Details  Name: Ashley Molina MRN: [id number] Date of Birth: 1961/01/14  [facility] of [facility] CM/SW Contact:    Magnus Ivan, LCSW Phone Number: 08/15/2019, 2:37 PM  Clinical Narrative:           CSW met with patient and patient's spouse at bedside. Explained CSW role. Patient lives with husband who is supportive. Patient reported she does not have a PCP or feel she needs one. She follows up with Oncology regularly. Patient uses [facility] representative on [address]. Patient denied issues with obtaining medicines. Patient reported she has oxygen at home through [employer]. Patient asked about getting portable O2 concentrator. Patient stated she tried to get one in the past but was told they were out of them due to COVID 19. CSW informed [place] who will follow up with patient. Patient denied using Home Health or SNF in the past. Patient reported she drives herself to appointments. Patient denied additional needs at this time and was encouraged to reach out if needs arise. CSW will continue to follow.   Expected Discharge Plan: Home/Self Care Barriers to Discharge: Continued Medical Work up   Patient Goals and CMS Choice        Expected Discharge Plan and Services Expected Discharge Plan: Home/Self Care       Living arrangements for the past 2 months: Single Family Home                   DME Agency: [employer] Date DME Agency Contacted: 08/15/19 Time DME Agency Contacted: [id number] Representative spoke with at DME Agency: [facility]            Prior Living Arrangements/Services Living arrangements for the past 2 months: Single Family Home Lives with:: Spouse Patient language and need for interpreter reviewed:: Yes Do you feel safe going back to the place where you live?: Yes      Need for Family Participation in Patient Care: Yes (Comment) Care giver support system in  place?: Yes (comment) Current home services: DME Criminal Activity/Legal Involvement Pertinent to Current Situation/Hospitalization: No - Comment as needed  Activities of Daily Living      Permission Sought/Granted         Permission granted to share info w AGENCY: [employer]        Emotional Assessment Appearance:: Appears stated age Attitude/Demeanor/Rapport: Engaged, Gracious Affect (typically observed): Appropriate, Calm, Hopeful Orientation: : Oriented to Self, Oriented to Place, Oriented to  Time, Oriented to Situation Alcohol / Substance Use: Never Used Psych Involvement: No (comment)  Admission diagnosis:  Respiratory failure ([place]) [J96.90] Acute pulmonary edema ([place]) [J81.0] Pneumonitis [J18.9] Elevated troponin [R77.8] Acute on chronic respiratory failure with hypoxia ([place]) [J96.21] Patient Active Problem List   Diagnosis Date Noted  . Palliative care by specialist   . Respiratory failure ([place]) 08/13/2019  . Acute pulmonary edema (HCC)   . Elevated troponin   . COVID-19 08/06/2019  . Pneumonitis 01/20/2019  . Goals of care, counseling/discussion 10/08/2018  . Chemotherapy induced neutropenia ([place]) 05/03/2017  . Carcinoma of right breast metastatic to lung ([place]) 04/15/2017  . Malignant neoplasm of upper-outer quadrant of right breast in female, estrogen receptor negative ([place]) 04/05/2017  . Anemia 03/14/2017  . Solitary cyst of breast, right 03/14/2017  . Pre-diabetes 03/14/2017  . Hypercholesterolemia 03/14/2017  . Mass of upper outer quadrant of right breast 03/14/2017   PCP:  Patient, No Pcp  Per Pharmacy:   [facility] [place], [place] [address] [place] [address] Phone: [phone] Fax: [phone]  [place], [place] [address] Phone: [phone] Fax:  [phone]  [facility] ([place], [place] [address] Phone: [phone] Fax: [place], [place] - [place] [address] [place] [address] Phone: [phone] Fax: [phone]     Social Determinants of Health (SDOH) Interventions    Readmission Risk Interventions Readmission Risk Prevention Plan 08/15/2019  Transportation Screening Complete  PCP or Specialist Appt within 3-5 Days Patient refused  [place] or Home Care Consult Complete  Medication Review (RN Care Manager) Complete  Some recent data might be hidden

## 2019-08-15 NOTE — Progress Notes (Signed)
Ashley Molina   DOB:09-28-1960   [id number]    Subjective: Patient feels better in terms of breathing since admission.  She is currently on nasal cannula oxygen.  She is having her breakfast.  Objective:  Vitals:   08/15/19 2000 08/15/19 2100  BP: 114/69 102/68  Pulse: (!) 103 (!) 102  Resp: 18 15  Temp: 98 F (36.7 C)   SpO2: 96% 97%     Intake/Output Summary (Last 24 hours) at 08/15/2019 2205 Last data filed at 08/15/2019 1358 Gross per 24 hour  Intake 120 ml  Output 2650 ml  Net -2530 ml    Physical Exam  Constitutional: She is oriented to person, place, and time and well-developed, well-nourished, and in no distress.  Patient on nasal cannula oxygen.  HENT:  Head: Normocephalic and atraumatic.  Mouth/Throat: Oropharynx is clear and moist. No oropharyngeal exudate.  Eyes: Pupils are equal, round, and reactive to light.  Cardiovascular: Normal rate and regular rhythm.  Pulmonary/Chest: No respiratory distress. She has no wheezes.  Scattered wheezing noted bilaterally.  Decreased breath sounds.  Abdominal: Soft. Bowel sounds are normal. She exhibits no distension and no mass. There is no abdominal tenderness. There is no rebound and no guarding.  Musculoskeletal:        General: No tenderness or edema. Normal range of motion.     Cervical back: Normal range of motion and neck supple.  Neurological: She is alert and oriented to person, place, and time.  Skin: Skin is warm.  Psychiatric: Affect normal.     Labs:  Lab Results  Component Value Date   WBC 10.0 08/15/2019   HGB 11.7 (L) 08/15/2019   HCT 38.0 08/15/2019   MCV 92.0 08/15/2019   PLT 201 08/15/2019   NEUTROABS 15.7 (H) 08/13/2019    Lab Results  Component Value Date   NA 136 08/15/2019   K 3.6 08/15/2019   CL 99 08/15/2019   CO2 27 08/15/2019    Studies:  DG Chest Port 1 View  Result Date: 08/14/2019 CLINICAL DATA:  Acute respiratory failure. EXAM: PORTABLE CHEST 1 VIEW COMPARISON:  Chest  x-ray 08/13/2019 FINDINGS: The left subclavian power port is stable. Stable radiation changes involving the right hilum and right paramediastinal lung. Persistent patchy nodular infiltrates and small right effusion. No pneumothorax. IMPRESSION: Stable chest x-ray. Persistent patchy nodular infiltrates and small right effusion. Electronically Signed   By: Marijo Sanes M.D.   On: 08/14/2019 06:19   ECHOCARDIOGRAM COMPLETE  Result Date: 08/14/2019    ECHOCARDIOGRAM REPORT   Patient Name:   Ashley Molina Date of Exam: 08/14/2019 Medical Rec #:  [id number]          Height:       63.0 in Accession #:    [id number]         Weight:       198.0 lb Date of Birth:  1960/09/17         BSA:          1.925 m Patient Age:    59 years           BP:           105/71 mmHg Patient Gender: F                  HR:           95 bpm. Exam Location:  [facility] Procedure: 2D Echo, Cardiac Doppler and Color Doppler Indications:     Dyspnea  786.09  History:         Patient has prior history of Echocardiogram examinations, most                  recent 04/22/2019. Breast cancer, covid-19, history of                  chemotherapy.  Sonographer:     [name] RDCS (AE) Referring Phys:  [id number] [facility] AMIN Diagnosing Phys: Serafina Royals MD  Sonographer Comments: Suboptimal apical window. IMPRESSIONS  1. Left ventricular ejection fraction, by estimation, is 55 to 60%. The left ventricle has normal function. The left ventricle has no regional wall motion abnormalities. Left ventricular diastolic parameters were normal.  2. Right ventricular systolic function is normal. The right ventricular size is normal. There is normal pulmonary artery systolic pressure.  3. The mitral valve is normal in structure. Trivial mitral valve regurgitation.  4. The aortic valve is normal in structure. Aortic valve regurgitation is not visualized. FINDINGS  Left Ventricle: Left ventricular ejection fraction, by estimation, is 55 to 60%. The left ventricle has  normal function. The left ventricle has no regional wall motion abnormalities. The left ventricular internal cavity size was normal in size. There is  no left ventricular hypertrophy. Left ventricular diastolic parameters were normal. Right Ventricle: The right ventricular size is normal. No increase in right ventricular wall thickness. Right ventricular systolic function is normal. There is normal pulmonary artery systolic pressure. The tricuspid regurgitant velocity is 1.87 m/s, and  with an assumed right atrial pressure of 10 mmHg, the estimated right ventricular systolic pressure is 0000000 mmHg. Left Atrium: Left atrial size was normal in size. Right Atrium: Right atrial size was normal in size. Pericardium: There is no evidence of pericardial effusion. Mitral Valve: The mitral valve is normal in structure. Trivial mitral valve regurgitation. Tricuspid Valve: The tricuspid valve is normal in structure. Tricuspid valve regurgitation is trivial. Aortic Valve: The aortic valve is normal in structure. Aortic valve regurgitation is not visualized. Aortic valve mean gradient measures 2.0 mmHg. Aortic valve peak gradient measures 3.3 mmHg. Aortic valve area, by VTI measures 3.78 cm. Pulmonic Valve: The pulmonic valve was normal in structure. Pulmonic valve regurgitation is not visualized. Aorta: The aortic root and ascending aorta are structurally normal, with no evidence of dilitation. IAS/Shunts: No atrial level shunt detected by color flow Doppler.  LEFT VENTRICLE PLAX 2D LVIDd:         3.86 cm  Diastology LVIDs:         2.43 cm  LV e' lateral:   9.79 cm/s LV PW:         0.82 cm  LV E/e' lateral: 6.0 LV IVS:        0.82 cm  LV e' medial:    6.96 cm/s LVOT diam:     2.00 cm  LV E/e' medial:  8.4 LV SV:         44 LV SV Index:   23 LVOT Area:     3.14 cm  RIGHT VENTRICLE RV Basal diam:  3.94 cm TAPSE (M-mode): 3.5 cm LEFT ATRIUM           Index       RIGHT ATRIUM           Index LA diam:      2.00 cm 1.04 cm/m  RA  Area:     19.40 cm LA Vol (A2C): 24.8 ml 12.88 ml/m RA Volume:   58.80  ml  30.54 ml/m LA Vol (A4C): 24.8 ml 12.88 ml/m  AORTIC VALVE                   PULMONIC VALVE AV Area (Vmax):    3.11 cm    PV Vmax:        0.48 m/s AV Area (Vmean):   3.48 cm    PV Peak grad:   0.9 mmHg AV Area (VTI):     3.78 cm    RVOT Peak grad: 1 mmHg AV Vmax:           90.35 cm/s AV Vmean:          62.550 cm/s AV VTI:            0.115 m AV Peak Grad:      3.3 mmHg AV Mean Grad:      2.0 mmHg LVOT Vmax:         89.40 cm/s LVOT Vmean:        69.200 cm/s LVOT VTI:          0.139 m LVOT/AV VTI ratio: 1.20  AORTA Ao Root diam: 2.50 cm MITRAL VALVE               TRICUSPID VALVE MV Area (PHT): 8.62 cm    TR Peak grad:   14.0 mmHg MV Decel Time: 88 msec     TR Vmax:        187.00 cm/s MV E velocity: 58.70 cm/s MV A velocity: 86.80 cm/s  SHUNTS MV E/A ratio:  0.68        Systemic VTI:  0.14 m                            Systemic Diam: 2.00 cm Serafina Royals MD Electronically signed by Serafina Royals MD Signature Date/Time: 08/14/2019/1:54:24 PM    Final     Malignant neoplasm of upper-outer quadrant of right breast in female, estrogen receptor negative ([place]) #59 year old female patient with history of metastatic breast cancer currently admitted to hospital for acute respiratory failure  #Metastatic triple negative breast cancer-most recently gemcitabine; currently on hold because of acute issues.  Clinically stable less likely progressive lymphangitic spread.  #Acute respiratory failure-needing BiPAP support-etiology unclear-fluid overload/pneumonitis/infection-respiratory status improvement noted; appreciate care provided by primary team/pulmonary.  Steroids/Lasix/antibiotics.  #We will continue to follow.   Cammie Sickle, MD 08/15/2019  10:05 PM

## 2019-08-15 NOTE — Progress Notes (Signed)
[place] attempted visit but pt. was talking w/someone via iPad.  [place] will follow up if possible.    08/15/19 1600  Clinical Encounter Type  Visited With Patient not available  Visit Type Initial;Critical Care  Referral From Physician  Consult/Referral To Chaplain

## 2019-08-15 NOTE — Progress Notes (Signed)
PROGRESS NOTE    Ashley Molina  [id number] DOB: 11-04-1960 DOA: 08/13/2019 PCP: Patient, No Pcp Per   Brief Narrative:   Ashley Molina is a 59 y.o. female with medical history significant of [name] a58 y.o.Caucasianfemalewith a known history of COVID-19 05/12/2019,[name] ,and right breast cancer with lung metastasiss/plumpectomy, chemotherapy and radiotherapy, who presented to the emergency room with worsening dyspnea. Recently admitted from 3/2 bottles 08/07/2019 with worsening shortness of breath and hemoptysis.  CT chest with disease progression.  She was sent home with continuous oxygen along with steroid and low-dose Lasix.. Patient was not using Lasix at home  due to increased urination.  There was some concern for pneumonitis secondary to Covid and gemcitabine.  She was supposed to follow-up with pulmonary as an outpatient.  Recently prednisone dose was increased to 40 mg daily until she sees her pulmonologist on 08/26/2019. Since prior to discharge patient continued to experience worsening dyspnea.  Increased her home oxygen to 4 L with no response.  Subjective: Patient was feeling little better when seen today.  Tussionex to help with cough temporarily.  Denies any chest pain.  Assessment & Plan:   Active Problems:   Malignant neoplasm of upper-outer quadrant of right breast in female, estrogen receptor negative ([place])   Respiratory failure ([place])   Palliative care by specialist  Acute on chronic hypoxic respiratory failure.   Improving.  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  She was also on gemcitabine which can also contribute to pneumonitis according to her pulmonologist. Repeat echocardiogram within normal limit.  Lactic acidosis resolved.  PCT<0.1 She was started on Solu-Medrol 40 mg twice daily and responded well.  Off  the BiPAP now. -Decrease Solu-Medrol to 20 mg twice daily. -Continue Tussionex twice daily for worsening cough. -Continue Lasix 40 mg daily. -Pulmonary was consulted-appreciate their recommendations. -Continue monitoring BMP and daily weight. -Strict intake and output.  Elevated troponin.  Most likely secondary to demand.  She also appears volume up.  Elevated BNP at 602.  Troponin curve flat, started trending down. Repeat echocardiogram within normal limit.  Tachycardia.  Patient remained tachycardic with heart rate in 120s. -Add low-dose metoprolol-we will titrate as blood pressure allows.  Leukocytosis.  Most likely steroid-induced.  Seems improving.  Patient remained afebrile and procalcitonin negative. -No need for antibiotics at this time. -Continue to monitor.  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  Patient with disease progression and recurrence of pulmonary mets. There was some concern about worsening pneumonitis due to gemcitabine.  Recently saw her oncologist Dr. Astrid Divine on 08/11/2019.  She does not think that gemcitabine is causing her pneumonitis.  She is also running out of her options. -Palliative consult to discuss goals of care and symptom management.  Patient does not want to give up.  Objective: Vitals:   08/15/19 0800 08/15/19 0900 08/15/19 1000 08/15/19 1100  BP: 108/76 103/65 109/67 97/72  Pulse: (!) 110 (!) 114 (!) 117 (!) 107  Resp: 19 (!) 21 (!) 21 20  Temp:  (!) 97.2 F (36.2 C)    TempSrc:  Oral    SpO2: 93% 95% 96% 97%  Weight:      Height:        Intake/Output Summary (Last 24 hours) at 08/15/2019 1551 Last data filed at 08/15/2019 1358 Gross per 24 hour  Intake 120 ml  Output 2650  ml  Net -2530 ml   Filed Weights   08/13/19 1502 08/13/19 2030  Weight: 93.1 kg 89.8 kg    Examination:  General exam: Appears calm and comfortable  Respiratory system: scattered dry crackles more prominent on left,  respiratory effort normal. Cardiovascular system: S1 & S2 heard, RRR. No JVD, murmurs, rubs, gallops or clicks. Gastrointestinal system: Soft, nontender, nondistended, bowel sounds positive. Central nervous system: Alert and oriented. No focal neurological deficits.Symmetric 5 x 5 power. Extremities: 1+ LE edema, no cyanosis, pulses intact and symmetrical. Skin: No rashes, lesions or ulcers Psychiatry: Judgement and insight appear normal.     DVT prophylaxis: Lovenox Code Status: Full Family Communication: Husband was updated at bedside. Disposition Plan: Pending improvement.  Patient wants to go back home.  Patient is high risk for deterioration and death due to her advanced stage IV breast cancer and worsening respiratory status.  Palliative care was consulted.  Consultants:   Pulmonary  Palliative care  Oncology  Procedures:  Antimicrobials:   Data Reviewed: I have personally reviewed following labs and imaging studies  CBC: Recent Labs  Lab 08/13/19 1502 08/14/19 0517 08/15/19 0639  WBC 17.1* 10.4 10.0  NEUTROABS 15.7*  --   --   HGB 12.9 12.6 11.7*  HCT 41.4 39.2 38.0  MCV 92.2 90.3 92.0  PLT 307 256 [id number]   Basic Metabolic Panel: Recent Labs  Lab 08/13/19 1502 08/14/19 0517 08/15/19 0639  NA 138 140 136  K 4.3 4.0 3.6  CL 105 103 99  CO2 20* 25 27  GLUCOSE 152* 130* 129*  BUN 26* 20 32*  CREATININE 0.71 0.78 0.89  CALCIUM 9.6 9.7 9.5  MG  --   --  2.1   GFR: Estimated Creatinine Clearance: 71.5 mL/min (by C-G formula based on SCr of 0.89 mg/dL). Liver Function Tests: Recent Labs  Lab 08/13/19 1502  AST 30  ALT 39  ALKPHOS 111  BILITOT 0.6  PROT 6.9  ALBUMIN 3.9   No results for input(s): LIPASE, AMYLASE in the last 168 hours. No results for input(s): AMMONIA in the last 168 hours. Coagulation Profile: No results for input(s): INR, PROTIME in the last 168 hours. Cardiac Enzymes: No results for input(s): CKTOTAL, CKMB, CKMBINDEX, TROPONINI  in the last 168 hours. BNP (last 3 results) No results for input(s): PROBNP in the last 8760 hours. HbA1C: No results for input(s): HGBA1C in the last 72 hours. CBG: Recent Labs  Lab 08/13/19 2035  GLUCAP 137*   Lipid Profile: No results for input(s): CHOL, HDL, LDLCALC, TRIG, CHOLHDL, LDLDIRECT in the last 72 hours. Thyroid Function Tests: No results for input(s): TSH, T4TOTAL, FREET4, T3FREE, THYROIDAB in the last 72 hours. Anemia Panel: No results for input(s): VITAMINB12, FOLATE, FERRITIN, TIBC, IRON, RETICCTPCT in the last 72 hours. Sepsis Labs: Recent Labs  Lab 08/13/19 1502 08/13/19 2112 08/14/19 0739 08/14/19 0811  PROCALCITON  --  <0.10  --   --   LATICACIDVEN 2.1* 2.0* 1.4 1.5    Recent Results (from the past 240 hour(s))  Blood Culture (routine x 2)     Status: None   Collection Time: 08/06/19  1:00 AM   Specimen: BLOOD  Result Value Ref Range Status   Specimen Description BLOOD RIGHT HAND  Final   Special Requests   Final    BOTTLES DRAWN AEROBIC AND ANAEROBIC Blood Culture results may not be optimal due to an inadequate volume of blood received in culture bottles   Culture   Final  NO GROWTH 5 DAYS Performed at [facility], [address] [place], [place] [address]    Report Status 08/11/2019 FINAL  Final  Blood Culture (routine x 2)     Status: None   Collection Time: 08/06/19  1:01 AM   Specimen: BLOOD  Result Value Ref Range Status   Specimen Description BLOOD RIGHT ARM  Final   Special Requests   Final    BOTTLES DRAWN AEROBIC AND ANAEROBIC Blood Culture results may not be optimal due to an inadequate volume of blood received in culture bottles   Culture   Final    NO GROWTH 5 DAYS Performed at [facility], [address], [place], [place] [address]    Report Status 08/11/2019 FINAL  Final  Blood Culture (routine x 2)     Status: None (Preliminary result)   Collection Time: 08/13/19  3:02 PM   Specimen: BLOOD  Result  Value Ref Range Status   Specimen Description BLOOD BLOOD LEFT HAND  Final   Special Requests   Final    BOTTLES DRAWN AEROBIC AND ANAEROBIC Blood Culture adequate volume   Culture   Final    NO GROWTH 2 DAYS Performed at [facility], [address], [place], [place] [address]    Report Status PENDING  Incomplete  Blood Culture (routine x 2)     Status: None (Preliminary result)   Collection Time: 08/13/19  3:07 PM   Specimen: BLOOD  Result Value Ref Range Status   Specimen Description BLOOD LEFT ANTECUBITAL  Final   Special Requests   Final    BOTTLES DRAWN AEROBIC AND ANAEROBIC Blood Culture adequate volume   Culture   Final    NO GROWTH 2 DAYS Performed at [facility], [place], [place] [address]    Report Status PENDING  Incomplete  SARS CORONAVIRUS 2 (TAT 6-24 HRS) Nasopharyngeal Nasopharyngeal Swab     Status: None   Collection Time: 08/13/19  8:16 PM   Specimen: Nasopharyngeal Swab  Result Value Ref Range Status   SARS Coronavirus 2 NEGATIVE NEGATIVE Final    Comment: (NOTE) SARS-CoV-2 target nucleic acids are NOT DETECTED. The SARS-CoV-2 RNA is generally detectable in upper and lower respiratory specimens during the acute phase of infection. Negative results do not preclude SARS-CoV-2 infection, do not rule out co-infections with other pathogens, and should not be used as the sole basis for treatment or other patient management decisions. Negative results must be combined with clinical observations, patient history, and epidemiological information. The expected result is Negative. Fact Sheet for Patients: SugarRoll.be Fact Sheet for Healthcare Providers: https://www.woods-mathews.com/ This test is not yet approved or cleared by the [place] FDA and  has been authorized for detection and/or diagnosis of SARS-CoV-2 by FDA under an Emergency Use Authorization (EUA). This EUA will remain    in effect (meaning this test can be used) for the duration of the COVID-19 declaration under Section 56 4(b)(1) of the Act, 21 U.S.C. section 360bbb-3(b)(1), unless the authorization is terminated or revoked sooner. Performed at [facility], [place] [address] [place], [place] [address]   MRSA PCR Screening     Status: None   Collection Time: 08/13/19 11:28 PM   Specimen: Nasopharyngeal  Result Value Ref Range Status   MRSA by PCR NEGATIVE NEGATIVE Final    Comment:        The GeneXpert MRSA Assay (FDA approved for NASAL specimens only), is one component of a  comprehensive MRSA colonization surveillance program. It is not intended to diagnose MRSA infection nor to guide or monitor treatment for MRSA infections. Performed at [facility], [address], [place], [place] [address]      Radiology Studies: [facility] Chest [place] 1 View  Result Date: 08/14/2019 CLINICAL DATA:  Acute respiratory failure. EXAM: PORTABLE CHEST 1 VIEW COMPARISON:  Chest x-ray 08/13/2019 FINDINGS: The left subclavian power port is stable. Stable radiation changes involving the right hilum and right paramediastinal lung. Persistent patchy nodular infiltrates and small right effusion. No pneumothorax. IMPRESSION: Stable chest x-ray. Persistent patchy nodular infiltrates and small right effusion. Electronically Signed   By: Marijo Sanes M.D.   On: 08/14/2019 06:19   ECHOCARDIOGRAM COMPLETE  Result Date: 08/14/2019    ECHOCARDIOGRAM REPORT   Patient Name:   NEIMA DYER Date of Exam: 08/14/2019 Medical Rec #:  [id number]          Height:       63.0 in Accession #:    [id number]         Weight:       198.0 lb Date of Birth:  May 31, 1961         BSA:          1.925 m Patient Age:    22 years           BP:           105/71 mmHg Patient Gender: F                  HR:           95 bpm. Exam Location:  [facility] Procedure: 2D Echo, Cardiac Doppler and Color Doppler Indications:     Dyspnea 786.09  History:          Patient has prior history of Echocardiogram examinations, most                  recent 04/22/2019. Breast cancer, covid-19, history of                  chemotherapy.  Sonographer:     [name] RDCS (AE) Referring Phys:  [id number] [facility] Broadus Costilla Diagnosing Phys: Serafina Royals MD  Sonographer Comments: Suboptimal apical window. IMPRESSIONS  1. Left ventricular ejection fraction, by estimation, is 55 to 60%. The left ventricle has normal function. The left ventricle has no regional wall motion abnormalities. Left ventricular diastolic parameters were normal.  2. Right ventricular systolic function is normal. The right ventricular size is normal. There is normal pulmonary artery systolic pressure.  3. The mitral valve is normal in structure. Trivial mitral valve regurgitation.  4. The aortic valve is normal in structure. Aortic valve regurgitation is not visualized. FINDINGS  Left Ventricle: Left ventricular ejection fraction, by estimation, is 55 to 60%. The left ventricle has normal function. The left ventricle has no regional wall motion abnormalities. The left ventricular internal cavity size was normal in size. There is  no left ventricular hypertrophy. Left ventricular diastolic parameters were normal. Right Ventricle: The right ventricular size is normal. No increase in right ventricular wall thickness. Right ventricular systolic function is normal. There is normal pulmonary artery systolic pressure. The tricuspid regurgitant velocity is 1.87 m/s, and  with an assumed right atrial pressure of 10 mmHg, the estimated right ventricular systolic pressure is 0000000 mmHg. Left Atrium: Left atrial size was normal in size. Right Atrium: Right atrial size was normal in size. Pericardium: There is no  evidence of pericardial effusion. Mitral Valve: The mitral valve is normal in structure. Trivial mitral valve regurgitation. Tricuspid Valve: The tricuspid valve is normal in structure. Tricuspid valve regurgitation is trivial.  Aortic Valve: The aortic valve is normal in structure. Aortic valve regurgitation is not visualized. Aortic valve mean gradient measures 2.0 mmHg. Aortic valve peak gradient measures 3.3 mmHg. Aortic valve area, by VTI measures 3.78 cm. Pulmonic Valve: The pulmonic valve was normal in structure. Pulmonic valve regurgitation is not visualized. Aorta: The aortic root and ascending aorta are structurally normal, with no evidence of dilitation. IAS/Shunts: No atrial level shunt detected by color flow Doppler.  LEFT VENTRICLE PLAX 2D LVIDd:         3.86 cm  Diastology LVIDs:         2.43 cm  LV e' lateral:   9.79 cm/s LV PW:         0.82 cm  LV E/e' lateral: 6.0 LV IVS:        0.82 cm  LV e' medial:    6.96 cm/s LVOT diam:     2.00 cm  LV E/e' medial:  8.4 LV SV:         44 LV SV Index:   23 LVOT Area:     3.14 cm  RIGHT VENTRICLE RV Basal diam:  3.94 cm TAPSE (M-mode): 3.5 cm LEFT ATRIUM           Index       RIGHT ATRIUM           Index LA diam:      2.00 cm 1.04 cm/m  RA Area:     19.40 cm LA Vol (A2C): 24.8 ml 12.88 ml/m RA Volume:   58.80 ml  30.54 ml/m LA Vol (A4C): 24.8 ml 12.88 ml/m  AORTIC VALVE                   PULMONIC VALVE AV Area (Vmax):    3.11 cm    PV Vmax:        0.48 m/s AV Area (Vmean):   3.48 cm    PV Peak grad:   0.9 mmHg AV Area (VTI):     3.78 cm    RVOT Peak grad: 1 mmHg AV Vmax:           90.35 cm/s AV Vmean:          62.550 cm/s AV VTI:            0.115 m AV Peak Grad:      3.3 mmHg AV Mean Grad:      2.0 mmHg LVOT Vmax:         89.40 cm/s LVOT Vmean:        69.200 cm/s LVOT VTI:          0.139 m LVOT/AV VTI ratio: 1.20  AORTA Ao Root diam: 2.50 cm MITRAL VALVE               TRICUSPID VALVE MV Area (PHT): 8.62 cm    TR Peak grad:   14.0 mmHg MV Decel Time: 88 msec     TR Vmax:        187.00 cm/s MV E velocity: 58.70 cm/s MV A velocity: 86.80 cm/s  SHUNTS MV E/A ratio:  0.68        Systemic VTI:  0.14 m  Systemic Diam: 2.00 cm Serafina Royals MD  Electronically signed by Serafina Royals MD Signature Date/Time: 08/14/2019/1:54:24 PM    Final     Scheduled Meds: . budesonide (PULMICORT) nebulizer solution  0.5 mg Nebulization BID  . Chlorhexidine Gluconate Cloth  6 each Topical Daily  . chlorpheniramine-HYDROcodone  5 mL Oral Q12H  . cholecalciferol  1,000 Units Oral QHS  . enoxaparin (LOVENOX) injection  40 mg Subcutaneous Q24H  . ferrous sulfate  325 mg Oral QHS  . furosemide  40 mg Intravenous Daily  . ipratropium-albuterol  3 mL Nebulization Q6H  . methylPREDNISolone (SOLU-MEDROL) injection  20 mg Intravenous Q12H  . metoprolol tartrate  12.5 mg Oral BID  . multivitamin with minerals  1 tablet Oral Daily  . omega-3 acid ethyl esters  1 g Oral Daily  . pantoprazole  40 mg Oral Daily  . sodium chloride flush  3 mL Intravenous Q12H  . sulfamethoxazole-trimethoprim  1 tablet Oral Once per day on Mon Wed Fri   Continuous Infusions:   LOS: 2 days   Time spent: 45 minutes.  Lorella Nimrod, MD [facility]  If 7PM-7AM, please contact night-coverage [email or web address]  08/15/2019, 3:50 PM   This record has been created using Systems analyst. Errors have been sought and corrected,but may not always be located. Such creation errors do not reflect on the standard of care.

## 2019-08-15 NOTE — Consult Note (Signed)
Consultation Note Date: 08/15/2019   Patient Name: Ashley Molina  DOB: [birth year]  MRN: [id number]  Age / Sex: 59 y.o., female  PCP: Patient, No Pcp Per Referring Physician: Lorella Nimrod, MD  Reason for Consultation: Establishing goals of care  HPI/Patient Profile: 59 y.o. female  with past medical history of right breast cancer with lung metastasis s/p lumpectomy/chemotherapy/radiation, covid-19 infection 12/7, recent hospitalization and concern for pneumonitis secondary to covid or ? gemcitabine admitted on 08/13/2019 with worsening shortness of breath and generalized weakness. Hospital admission for acute on chronic hypoxic respiratory failure receiving steroids and lasix. Followed by Dr. Janese Banks. Notes reviewed. Palliative medicine consultation for goals of care and symptom management.   Clinical Assessment and Goals of Care:  I have reviewed medical records, discussed with care team, and met with patient at bedside. Ashley Molina is alert, oriented, and able to participate in discussion.   I introduced Palliative Medicine as specialized medical care for people living with serious illness. It focuses on providing relief from the symptoms and stress of a serious illness. The goal is to improve quality of life for both the patient and the family.  We discussed a brief life review of the patient. Married with three children. Very supportive family. Patient still working from home for [employer] 40 hours a week. Prior to admission, independent and ambulatory but with worsening shortness of breath. She requires intermittent oxygen with chores and HS prn.   Discussed her journey since diagnosis of breast cancer in 2018. She was seen at [facility] and has been followed by Dr. Janese Banks since diagnosis. Ashley Molina greatly appreciates communication between Dr. Janese Banks and Dr. Duwayne Heck.   Discussed events leading up to admission and course of  hospitalization including diagnoses, interventions, plan of care.   Advanced directives and concepts specific to code status were discussed. At this point, patient desires FULL code/FULL scope treatment. She shares that she is not ready to give up and has tolerated treatment suprisingly well and without side effects, up until hospitalizations in the last few weeks. She does report a documented living will with husband as POA. Ashley Molina would likely not wish for prolonged heroic interventions if unable to survive off a ventilator. She shares she has discussed with husband and he knows where here living will is located when needed. Encouraged ongoing discussions throughout her journey with cancer.   I attempted to elicit values and goals of care important to the patient. Again, patient is not ready to give up and desires FULL scope treatment. She plans to follow up pulmonology on 3/24 and Dr. Janese Banks on 3/25. If necessary, she would desire referral to [facility] for second opinion.   Palliative Care services outpatient were explained and offered. Discussed with Billey Chang, palliative NP. Patient is agreeable with outpatient palliative follow-up with Josh in the cancer center.   Therapeutic listening and emotional/spiritual support provided. Patient is very positive and strong-willed with great support from her family.   Questions and concerns were addressed. PMT contact information given.  SUMMARY OF RECOMMENDATIONS    Patient desires FULL code/FULL scope treatment  Patient plans to f/u with oncology and pulmonology outpatient.   Patient willing to pursue second opinion through [facility] if necessary.   Patient does have a documented living will and has started discussions with her husband. Patient would likely not wish for prolonged heroic interventions if unable to survive off a ventilator. Encouraged ongoing discussions with her husband.  Patient agreeable with outpatient palliative f/u at [facility] with Billey Chang, NP. Discussed with Josh. Ongoing palliative discussions.   Code Status/Advance Care Planning:  Full code  Symptom Management:   Consider low dose Roxanol for dyspnea/air hunger if dyspnea persists despite aggressive medical management.   Patient denies other symptoms.  Palliative Prophylaxis:   Bowel Regimen, Delirium Protocol and Frequent Pain Assessment  Additional Recommendations (Limitations, Scope, Preferences):  Full Scope Treatment  Psycho-social/Spiritual:   Desire for further Chaplaincy support: yes   Additional Recommendations: Caregiving  Support/Resources  Prognosis:   Unable to determine  Discharge Planning: Home     Primary Diagnoses: Present on Admission: . Respiratory failure ([place])   I have reviewed the medical record, interviewed the patient and family, and examined the patient. The following aspects are pertinent.  Past Medical History:  Diagnosis Date  . Anemia   . Breast cancer ([place])   . Breast cancer, right ([place]) 04/2017   Hx Lumpectomy, Chemo + Rad tx's.  . Breast cyst, right    aspirated by Dr. Bary Castilla  . COVID-19   . Hyperlipidemia   . Personal history of chemotherapy   . Pre-diabetes    Social History   Socioeconomic History  . Marital status: Married    Spouse name: Not on file  . Number of children: Not on file  . Years of education: Not on file  . Highest education level: Not on file  Occupational History  . Not on file  Tobacco Use  . Smoking status: Never Smoker  . Smokeless tobacco: Never Used  Substance and Sexual Activity  . Alcohol use: Not Currently  . Drug use: No  . Sexual activity: Not Currently  Other Topics Concern  . Not on file  Social History Narrative  . Not on file   Social Determinants of Health   Financial Resource Strain:   . Difficulty of Paying Living Expenses:   Food Insecurity:   . Worried About Charity fundraiser in the Last Year:   . Arboriculturist in the  Last Year:   Transportation Needs:   . Film/video editor (Medical):   Marland Kitchen Lack of Transportation (Non-Medical):   Physical Activity:   . Days of Exercise per Week:   . Minutes of Exercise per Session:   Stress:   . Feeling of Stress :   Social Connections:   . Frequency of Communication with Friends and Family:   . Frequency of Social Gatherings with Friends and Family:   . Attends Religious Services:   . Active Member of Clubs or Organizations:   . Attends Archivist Meetings:   Marland Kitchen Marital Status:    Family History  Problem Relation Age of Onset  . Melanoma Maternal Grandmother 21       currently 64  . Brain cancer Maternal Grandfather 35       unk. type; deceased in 73s  . Melanoma Other 4       mat grandmother's father  . Melanoma Father 32  on head; currently 25  . Prostate cancer Maternal Uncle        3 maternal uncles; dx in 75s  . Breast cancer Other        mat grandfather's sister; dx 57s   Scheduled Meds: . budesonide (PULMICORT) nebulizer solution  0.5 mg Nebulization BID  . Chlorhexidine Gluconate Cloth  6 each Topical Daily  . chlorpheniramine-HYDROcodone  5 mL Oral Q12H  . cholecalciferol  1,000 Units Oral QHS  . enoxaparin (LOVENOX) injection  40 mg Subcutaneous Q24H  . ferrous sulfate  325 mg Oral QHS  . furosemide  40 mg Intravenous Daily  . ipratropium-albuterol  3 mL Nebulization Q6H  . methylPREDNISolone (SOLU-MEDROL) injection  20 mg Intravenous Q12H  . metoprolol tartrate  12.5 mg Oral BID  . multivitamin with minerals  1 tablet Oral Daily  . omega-3 acid ethyl esters  1 g Oral Daily  . pantoprazole  40 mg Oral Daily  . sodium chloride flush  3 mL Intravenous Q12H  . sulfamethoxazole-trimethoprim  1 tablet Oral Once per day on Mon Wed Fri   Continuous Infusions: PRN Meds:.acetaminophen **OR** acetaminophen, guaiFENesin-dextromethorphan, HYDROcodone-acetaminophen, levalbuterol, ondansetron **OR** ondansetron (ZOFRAN) IV,  polyethylene glycol Medications Prior to Admission:  Prior to Admission medications   Medication Sig Start Date End Date Taking? Authorizing Provider  Biotin 2500 MCG CAPS Take 5,000 mcg/day by mouth at bedtime.    Yes [provider]  Cholecalciferol (VITAMIN D3) 10000 units TABS Take 1 tablet by mouth at bedtime.    Yes [provider]  Ferrous Sulfate (IRON) 325 (65 Fe) MG TABS Take 1 tablet by mouth at bedtime.    Yes [provider]  Fluticasone Furoate (ARNUITY ELLIPTA) 100 MCG/ACT AEPB Inhale 1 puff into the lungs daily. 06/11/19  Yes Tyler Pita, MD  Multiple Vitamins-Minerals (CENTRUM SILVER ADULT 50+) TABS Take 1 tablet by mouth at bedtime.    Yes [provider]  Omega-3 Fatty Acids (FISH OIL) 1000 MG CAPS Take 1 capsule by mouth at bedtime.    Yes [provider]  pantoprazole (PROTONIX) 20 MG tablet Take 1 tablet (20 mg total) by mouth daily. 06/20/19  Yes Sindy Guadeloupe, MD  predniSONE (DELTASONE) 20 MG tablet Take 2 tablets (40 mg total) by mouth daily with breakfast. 08/11/19  Yes Sindy Guadeloupe, MD  sulfamethoxazole-trimethoprim (BACTRIM DS) 800-160 MG tablet 1 tablet three times per week. 07/28/19  Yes Tyler Pita, MD  furosemide (LASIX) 20 MG tablet Take 1 tablet (20 mg total) by mouth every other day. Diuretic, water pill. 08/07/19 09/06/19  Enzo Bi, MD  prochlorperazine (COMPAZINE) 10 MG tablet Take 1 tablet (10 mg total) by mouth every 6 (six) hours as needed (Nausea or vomiting). 04/05/17 09/05/18  Sindy Guadeloupe, MD   Allergies  Allergen Reactions  . Penicillins Rash   Review of Systems  Constitutional: Positive for activity change.  Respiratory: Positive for shortness of breath.    Physical Exam Vitals and nursing note reviewed.  Constitutional:      General: She is awake.  HENT:     Head: Normocephalic and atraumatic.  Cardiovascular:     Rate and Rhythm: Tachycardia present.  Pulmonary:     Effort: No  tachypnea, accessory muscle usage or respiratory distress.  Neurological:     Mental Status: She is alert and oriented to person, place, and time.  Psychiatric:        Mood and Affect: Mood normal.  Speech: Speech normal.        Behavior: Behavior normal.        Cognition and Memory: Cognition normal.     Vital Signs: BP 97/72   Pulse (!) 107   Temp (!) 97.2 F (36.2 C) (Oral)   Resp 20   Ht 5' 3"  (1.6 m)   Wt 89.8 kg   LMP 04/04/2012 (Approximate)   SpO2 97%   BMI 35.07 kg/m  Pain Scale: 0-10   Pain Score: 0-No pain   SpO2: SpO2: 97 % O2 Device:SpO2: 97 % O2 Flow Rate: .O2 Flow Rate (L/min): 3 L/min  IO: Intake/output summary:   Intake/Output Summary (Last 24 hours) at 08/15/2019 1417 Last data filed at 08/15/2019 1358 Gross per 24 hour  Intake 120 ml  Output 3450 ml  Net -3330 ml    LBM: Last BM Date: 08/13/19 Baseline Weight: Weight: 93.1 kg Most recent weight: Weight: 89.8 kg     Palliative Assessment/Data: PPS 60%   Flowsheet Rows     Most Recent Value  Intake Tab  Referral Department  Hospitalist  Unit at Time of Referral  ICU  Palliative Care Primary Diagnosis  Cancer  Palliative Care Type  New Palliative care  Reason for referral  Clarify Goals of Care  Date first seen by Palliative Care  08/15/19  Clinical Assessment  Palliative Performance Scale Score  60%  Psychosocial & Spiritual Assessment  Palliative Care Outcomes  Patient/Family meeting held?  Yes  Who was at the meeting?  patient  Palliative Care Outcomes  Clarified goals of care, Provided psychosocial or spiritual support, Linked to palliative care logitudinal support, ACP counseling assistance      Time In: 1115 Time Out: 1230 Time Total: 40mn Greater than 50%  of this time was spent counseling and coordinating care related to the above assessment and plan.  Signed by:  MIhor Dow DNP, FNP-C Palliative Medicine Team  Phone: [phone]: [phone] Please  contact Palliative Medicine Team phone at [phone] questions and concerns.  For individual provider: See AShea Evans

## 2019-08-15 NOTE — Progress Notes (Signed)
Pharmacy Electrolyte Monitoring Consult:  Pharmacy consulted to assist in monitoring and replacing electrolytes in this 59 y.o. female admitted on 08/13/2019 with pneumonitis and pulmonary edema. Patient with past medical history significant for Breast Cancer, COVID-19, and Hyperlipidemia. Patient is currently ordered furosemide 40mg  IV Daily.   Labs:  Sodium (mmol/L)  Date Value  08/15/2019 136  03/28/2017 140   Potassium (mmol/L)  Date Value  08/15/2019 3.6   Magnesium (mg/dL)  Date Value  08/15/2019 2.1   Calcium (mg/dL)  Date Value  08/15/2019 9.5   Albumin (g/dL)  Date Value  08/13/2019 3.9  03/28/2017 4.3    Assessment/Plan: Potassium 59mEq PO x 1. Will replace orally when possible in setting of diuresis.   Will obtain BMP/Magnesium/Phosphorus with am labs.   Will replace for goal potassium ~ 4 and goal magnesium ~ 2.   Pharmacy will continue to monitor and adjust per consult.   Ashley Molina L 08/15/2019 1:42 PM

## 2019-08-16 LAB — BASIC METABOLIC PANEL
Anion gap: 9 (ref 5–15)
BUN: 25 mg/dL — ABNORMAL HIGH (ref 6–20)
CO2: 30 mmol/L (ref 22–32)
Calcium: 9.3 mg/dL (ref 8.9–10.3)
Chloride: 97 mmol/L — ABNORMAL LOW (ref 98–111)
Creatinine, Ser: 0.74 mg/dL (ref 0.44–1.00)
GFR calc Af Amer: >60 mL/min (ref >60)
GFR calc non Af Amer: >60 mL/min (ref >60)
Glucose, Bld: 129 mg/dL — ABNORMAL HIGH (ref 70–99)
Potassium: 4 mmol/L (ref 3.5–5.1)
Sodium: 136 mmol/L (ref 135–145)

## 2019-08-16 LAB — PHOSPHORUS: Phosphorus: 3.4 mg/dL (ref 2.5–4.6)

## 2019-08-16 LAB — CBC
HCT: 36.5 % (ref 36.0–46.0)
Hemoglobin: 11.6 g/dL — ABNORMAL LOW (ref 12.0–15.0)
MCH: 29.1 pg (ref 26.0–34.0)
MCHC: 31.8 g/dL (ref 30.0–36.0)
MCV: 91.5 fL (ref 80.0–100.0)
Platelets: 191 10*3/uL (ref 150–400)
RBC: 3.99 MIL/uL (ref 3.87–5.11)
RDW: 15.9 % — ABNORMAL HIGH (ref 11.5–15.5)
WBC: 10.4 10*3/uL (ref 4.0–10.5)
nRBC: 0 % (ref 0.0–0.2)

## 2019-08-16 LAB — MAGNESIUM: Magnesium: 2.2 mg/dL (ref 1.7–2.4)

## 2019-08-16 MED ORDER — METOPROLOL TARTRATE 25 MG PO TABS: 25.0000 mg | ORAL_TABLET | Freq: Two times a day (BID) | ORAL | 0 refills | Status: DC

## 2019-08-16 MED ORDER — FUROSEMIDE 20 MG PO TABS: 20.0000 mg | ORAL_TABLET | Freq: Every day | ORAL | 0 refills | Status: DC

## 2019-08-16 MED ORDER — IPRATROPIUM-ALBUTEROL 0.5-2.5 (3) MG/3ML IN SOLN
3.0000 mL | Freq: Three times a day (TID) | RESPIRATORY_TRACT | Status: DC
  Administered 2019-08-16: 3 mL via RESPIRATORY_TRACT
  Filled 2019-08-16: qty 3

## 2019-08-16 MED ORDER — SULFAMETHOXAZOLE-TRIMETHOPRIM 800-160 MG PO TABS: 1.0000 | ORAL_TABLET | ORAL | 0 refills | Status: AC

## 2019-08-16 MED ORDER — HYDROCOD POLST-CPM POLST ER 10-8 MG/5ML PO SUER: 5.0000 mL | Freq: Two times a day (BID) | ORAL | 0 refills | Status: AC

## 2019-08-16 MED ORDER — GUAIFENESIN-DM 100-10 MG/5ML PO SYRP: 15.0000 mL | ORAL_SOLUTION | ORAL | 0 refills | Status: AC | PRN

## 2019-08-16 MED ORDER — METOPROLOL TARTRATE 25 MG PO TABS: 25.0000 mg | ORAL_TABLET | Freq: Two times a day (BID) | ORAL | Status: DC

## 2019-08-16 MED ORDER — BUDESONIDE 0.5 MG/2ML IN SUSP: 0.5000 mg | Freq: Two times a day (BID) | RESPIRATORY_TRACT | 12 refills | Status: AC

## 2019-08-16 MED ORDER — IPRATROPIUM-ALBUTEROL 0.5-2.5 (3) MG/3ML IN SOLN: 3.0000 mL | Freq: Four times a day (QID) | RESPIRATORY_TRACT | 0 refills | Status: AC

## 2019-08-16 MED ORDER — HYDROCOD POLST-CPM POLST ER 10-8 MG/5ML PO SUER: 5.0000 mL | Freq: Two times a day (BID) | ORAL | Status: DC | PRN

## 2019-08-16 MED ORDER — POTASSIUM CHLORIDE CRYS ER 20 MEQ PO TBCR
20.0000 meq | EXTENDED_RELEASE_TABLET | Freq: Once | ORAL | Status: AC
  Administered 2019-08-16: 20 meq via ORAL
  Filled 2019-08-16: qty 1

## 2019-08-16 MED ORDER — PREDNISONE 10 MG PO TABS: 40.0000 mg | ORAL_TABLET | Freq: Every day | ORAL | Status: DC

## 2019-08-16 NOTE — Progress Notes (Signed)
Nebulizer given to patient. Education provided to patient regarding nebulizer. Patient currently stable on discharge with no complaints.

## 2019-08-16 NOTE — Discharge Summary (Addendum)
Physician Discharge Summary  [name] [id number] DOB: [birth year] DOA: 08/13/2019  PCP: Patient, No Pcp Per  Admit date: 08/13/2019 Discharge date: 08/16/2019  Admitted From: Home Disposition: Home  Recommendations for Outpatient Follow-up:  1. Follow up with PCP in 1-2 weeks 2. Please obtain BMP/CBC in one week 3. Please follow up on the following pending results: None  Home Health: yes Equipment/Devices: Home oxygen.  Wheelchair, bedside commode. Discharge Condition: Stable, guarded prognosis due to advanced cancer and respiratory failure. CODE STATUS: Full Diet recommendation: Heart Healthy  Brief/Interim Summary: Ericha L Molina a 59 y.o.femalewith medical history significant ofLaurenaWoodromeis a59 y.o.Caucasianfemalewith a known history of COVID-19 05/12/2019,[name] ,and right breast cancer with lung metastasiss/plumpectomy, chemotherapy and radiotherapy, who presented to the emergency room with worsening dyspnea. Recently admitted from 3/2 bottles 08/07/2019 with worsening shortness of breath and hemoptysis.CT chest with disease progression. She was sent home with continuous oxygen along with steroid and low-dose Lasix..Patient was not using Lasix at home due to increased urination.There was some concern for pneumonitis secondary to Covid and gemcitabine.She was supposed to follow-up with pulmonary as an outpatient.Recently prednisone dose was increased to 40 mg daily until she sees her pulmonologist on 08/26/2019. Since prior to discharge patient continued to experience worsening dyspnea.  Increased her home oxygen to 4 L with no response.  She initially required BiPAP. She was given high-dose steroid and responded well.  She has responded well to steroids in the past too.  She was discharged home with instructions to use oxygen all the time 3 to 4 L, can increases during activity as needed.  She was also  discharged on prednisone 40 mg daily along with nebulizer treatments.  And nebulizer machine was ordered.  She will follow-up with her pulmonologist according to her schedule appointment next week for further management.  She was also instructed to continue using Lasix as it will also help with her breathing. Patient has a guarded prognosis due to advanced cancer and respiratory failure.  Her oncologist is trying to get a second opinion from [facility] as they are running out of options.  Patient does not want to give up at this time.  There is a concern for gemcitabine toxicity/lymphangitis spread of her disease. Palliative care was also consulted but patient wants to try if there is any option left for her and does not want to give up.  They will help for symptom management at this time.  She did had elevated troponin which were thought to be secondary to demand.  Echocardiogram was within normal limit.  BNP mildly elevated.  She was diuresed and instructed to continue using Lasix at home.  Patient had persistent tachycardia.  She was started on metoprolol with good response.  She will need a follow-up with cardiology or her PCP can titrate dose as needed.  Patient did had some leukocytosis most likely secondary to steroid.  Procalcitonin remain negative and she was not treated with any antibiotics.  She will continue Bactrim 3 times a week for PCP prophylaxis due to her current underlying lung disease.  Discharge Diagnoses:  Active Problems:   Malignant neoplasm of upper-outer quadrant of right breast in female, estrogen receptor negative ([place])   Respiratory failure ([place])   Palliative care by specialist   Discharge Instructions  Discharge Instructions    Diet - low sodium heart healthy   Complete by: As directed    Discharge instructions   Complete by: As directed  It was pleasure taking care of you. Please continue using oxygen as directed. Follow-up with your pulmonologist according to your  schedule appointment next week for further management. We did ordered a nebulizer machine to be used at home along with some medications, your pulmonologist should be able to monitor and make changes as needed.   Increase activity slowly   Complete by: As directed      Allergies as of 08/16/2019      Reactions   Penicillins Rash      Medication List    TAKE these medications   Arnuity Ellipta 100 MCG/ACT Aepb Generic drug: Fluticasone Furoate Inhale 1 puff into the lungs daily.   Biotin 2500 MCG Caps Take 5,000 mcg/day by mouth at bedtime.   budesonide 0.5 MG/2ML nebulizer solution Commonly known as: PULMICORT Take 2 mLs (0.5 mg total) by nebulization 2 (two) times daily.   Centrum Silver Adult 50+ Tabs Take 1 tablet by mouth at bedtime.   chlorpheniramine-HYDROcodone 10-8 MG/5ML Suer Commonly known as: TUSSIONEX Take 5 mLs by mouth every 12 (twelve) hours.   Fish Oil 1000 MG Caps Take 1 capsule by mouth at bedtime.   furosemide 20 MG tablet Commonly known as: Lasix Take 1 tablet (20 mg total) by mouth daily. Diuretic, water pill. What changed: when to take this   guaiFENesin-dextromethorphan 100-10 MG/5ML syrup Commonly known as: ROBITUSSIN DM Take 15 mLs by mouth every 4 (four) hours as needed for cough.   ipratropium-albuterol 0.5-2.5 (3) MG/3ML Soln Commonly known as: DUONEB Take 3 mLs by nebulization every 6 (six) hours.   Iron 325 (65 Fe) MG Tabs Take 1 tablet by mouth at bedtime.   metoprolol tartrate 25 MG tablet Commonly known as: LOPRESSOR Take 1 tablet (25 mg total) by mouth 2 (two) times daily.   pantoprazole 20 MG tablet Commonly known as: Protonix Take 1 tablet (20 mg total) by mouth daily.   predniSONE 20 MG tablet Commonly known as: Deltasone Take 2 tablets (40 mg total) by mouth daily with breakfast.   sulfamethoxazole-trimethoprim 800-160 MG tablet Commonly known as: BACTRIM DS Take 1 tablet by mouth 3 (three) times a week. Start  taking on: August 18, 2019 What changed:   how much to take  how to take this  when to take this  additional instructions   Vitamin D3 250 MCG (10000 UT) Tabs Take 1 tablet by mouth at bedtime.            Durable Medical Equipment  (From admission, onward)         Start     Ordered   08/16/19 1111  For home use only DME Nebulizer machine  Once    Question Answer Comment  Patient needs a nebulizer to treat with the following condition Pneumonitis   Patient needs a nebulizer to treat with the following condition Asthma   Length of Need Lifetime      08/16/19 1112          Allergies  Allergen Reactions  . Penicillins Rash    Consultations:  Pulmonary  Oncology  Palliative care  Procedures/Studies: CT Angio Chest PE W and/or Wo Contrast  Result Date: 08/06/2019 CLINICAL DATA:  Hemoptysis, history of breast cancer and COVID EXAM: CT ANGIOGRAPHY CHEST WITH CONTRAST TECHNIQUE: Multidetector CT imaging of the chest was performed using the standard protocol during bolus administration of intravenous contrast. Multiplanar CT image reconstructions and MIPs were obtained to evaluate the vascular anatomy. CONTRAST:  26mL OMNIPAQUE IOHEXOL 350  MG/ML SOLN COMPARISON:  July 09, 2019 FINDINGS: Cardiovascular: There is a optimal opacification of the pulmonary arteries. There is no central,segmental, or subsegmental filling defects within the pulmonary arteries. A left-sided MediPort catheter is seen with the tip in the right atrium. The heart is normal in size. No pericardial effusion or thickening. No evidence right heart strain. There is normal three-vessel brachiocephalic anatomy without proximal stenosis. Scattered mild aortic atherosclerosis. Mediastinum/Nodes: No hilar, mediastinal, or axillary adenopathy. Thyroid gland, trachea, and esophagus demonstrate no significant findings. Lungs/Pleura: There is new/worsening multifocal patchy predominantly peripherally based  ground-glass opacities seen throughout both lungs. There is also slight interval worsening in the tree-in-bud nodular hazy opacities within the left lung apex. There is unchanged spiculated masslike consolidation within the right infrahilar region, likely from post treatment changes/fibrosis. There is a trace right pleural effusion present. Upper Abdomen: No acute abnormalities present in the visualized portions of the upper abdomen. Musculoskeletal: No chest wall abnormality. No acute or significant osseous findings. Again noted is diffuse skin thickening seen along the right breast. There is a surgical clips within the right breast. Review of the MIP images confirms the above findings. IMPRESSION: 1. No central, segmental, or subsegmental pulmonary embolism. 2. Interval worsening of the multifocal patchy ground-glass opacities throughout both lungs, consistent with multifocal pneumonia. 3. Interval slight worsening in the tree-in-bud nodular opacities within the left upper lobe, likely due to atypical infectious etiology, however cannot exclude underlying metastatic disease. 4. Post treatment radiation/fibrotic changes in the right infrahilar region. 5. Trace right pleural effusion Electronically Signed   By: Prudencio Pair M.D.   On: 08/06/2019 00:17   DG Chest Port 1 View  Result Date: 08/14/2019 CLINICAL DATA:  Acute respiratory failure. EXAM: PORTABLE CHEST 1 VIEW COMPARISON:  Chest x-ray 08/13/2019 FINDINGS: The left subclavian power port is stable. Stable radiation changes involving the right hilum and right paramediastinal lung. Persistent patchy nodular infiltrates and small right effusion. No pneumothorax. IMPRESSION: Stable chest x-ray. Persistent patchy nodular infiltrates and small right effusion. Electronically Signed   By: Marijo Sanes M.D.   On: 08/14/2019 06:19   DG Chest Port 1 View  Result Date: 08/13/2019 CLINICAL DATA:  Shortness of breath, tachypnea with decreased oxygen saturations.,  history of breast cancer. EXAM: PORTABLE CHEST 1 VIEW COMPARISON:  08/06/2019, CT of the chest. FINDINGS: Masslike appearance of right hilum similar to previous exam. Cardiomediastinal contours are stable. Left Port-A-Cath terminates at the caval to atrial junction. Nodular opacities are again suggested and there is increase in interstitial prominence when compared to the chest x-ray from 08/05/2019. Blunting of right costophrenic angles suggest scarring or small effusion. No signs of dense consolidation aside from perihilar changes discussed above. Visualized skeletal structures without acute bone finding. IMPRESSION: Masslike appearance of right hilum similar to previous exam. Nodular opacities are again suggested and there is increase in interstitial prominence when compared to the chest x-ray from 08/05/2019. Findings may represent worsening of pulmonary parenchymal disease, associated with previously reported COVID-19 infection, particularly in the left lung base, potentially with a background of pulmonary edema. Electronically Signed   By: Zetta Bills M.D.   On: 08/13/2019 15:26   DG Chest Portable 1 View  Result Date: 08/05/2019 CLINICAL DATA:  59 year old female with hemoptysis. History of breast cancer. EXAM: PORTABLE CHEST 1 VIEW COMPARISON:  Chest radiograph dated 04/09/2017. CT dated 07/09/2019. FINDINGS: Port-A-Cath with tip in the region of the cavoatrial junction. Right hilar density corresponding to the post treatment/post radiation fibrosis seen  on the prior CT. Diffuse interstitial and vascular prominence with scattered faint reticulonodular densities throughout the lungs. Probable small bilateral pleural effusions. No pneumothorax. The cardiac silhouette is within normal limits. Atherosclerotic calcification of the aorta. No acute osseous pathology. IMPRESSION: Right hilar post radiation fibrosis and scattered bilateral reticulonodular densities and small bilateral pleural effusions. Overall  findings are similar to the CT of 07/09/2019. Electronically Signed   By: Anner Crete M.D.   On: 08/05/2019 22:59   ECHOCARDIOGRAM COMPLETE  Result Date: 08/14/2019    ECHOCARDIOGRAM REPORT   Patient Name:   TAYRA STEIL Date of Exam: 08/14/2019 Medical Rec #:  [id number]          Height:       63.0 in Accession #:    [id number]         Weight:       198.0 lb Date of Birth:  1961-02-26         BSA:          1.925 m Patient Age:    69 years           BP:           105/71 mmHg Patient Gender: F                  HR:           95 bpm. Exam Location:  [facility] Procedure: 2D Echo, Cardiac Doppler and Color Doppler Indications:     Dyspnea 786.09  History:         Patient has prior history of Echocardiogram examinations, most                  recent 04/22/2019. Breast cancer, covid-19, history of                  chemotherapy.  Sonographer:     [name] RDCS (AE) Referring Phys:  [id number] [facility] Oday Ridings Diagnosing Phys: Serafina Royals MD  Sonographer Comments: Suboptimal apical window. IMPRESSIONS  1. Left ventricular ejection fraction, by estimation, is 55 to 60%. The left ventricle has normal function. The left ventricle has no regional wall motion abnormalities. Left ventricular diastolic parameters were normal.  2. Right ventricular systolic function is normal. The right ventricular size is normal. There is normal pulmonary artery systolic pressure.  3. The mitral valve is normal in structure. Trivial mitral valve regurgitation.  4. The aortic valve is normal in structure. Aortic valve regurgitation is not visualized. FINDINGS  Left Ventricle: Left ventricular ejection fraction, by estimation, is 55 to 60%. The left ventricle has normal function. The left ventricle has no regional wall motion abnormalities. The left ventricular internal cavity size was normal in size. There is  no left ventricular hypertrophy. Left ventricular diastolic parameters were normal. Right Ventricle: The right ventricular size is  normal. No increase in right ventricular wall thickness. Right ventricular systolic function is normal. There is normal pulmonary artery systolic pressure. The tricuspid regurgitant velocity is 1.87 m/s, and  with an assumed right atrial pressure of 10 mmHg, the estimated right ventricular systolic pressure is 0000000 mmHg. Left Atrium: Left atrial size was normal in size. Right Atrium: Right atrial size was normal in size. Pericardium: There is no evidence of pericardial effusion. Mitral Valve: The mitral valve is normal in structure. Trivial mitral valve regurgitation. Tricuspid Valve: The tricuspid valve is normal in structure. Tricuspid valve regurgitation is trivial. Aortic Valve: The aortic valve is normal in structure. Aortic  valve regurgitation is not visualized. Aortic valve mean gradient measures 2.0 mmHg. Aortic valve peak gradient measures 3.3 mmHg. Aortic valve area, by VTI measures 3.78 cm. Pulmonic Valve: The pulmonic valve was normal in structure. Pulmonic valve regurgitation is not visualized. Aorta: The aortic root and ascending aorta are structurally normal, with no evidence of dilitation. IAS/Shunts: No atrial level shunt detected by color flow Doppler.  LEFT VENTRICLE PLAX 2D LVIDd:         3.86 cm  Diastology LVIDs:         2.43 cm  LV e' lateral:   9.79 cm/s LV PW:         0.82 cm  LV E/e' lateral: 6.0 LV IVS:        0.82 cm  LV e' medial:    6.96 cm/s LVOT diam:     2.00 cm  LV E/e' medial:  8.4 LV SV:         44 LV SV Index:   23 LVOT Area:     3.14 cm  RIGHT VENTRICLE RV Basal diam:  3.94 cm TAPSE (M-mode): 3.5 cm LEFT ATRIUM           Index       RIGHT ATRIUM           Index LA diam:      2.00 cm 1.04 cm/m  RA Area:     19.40 cm LA Vol (A2C): 24.8 ml 12.88 ml/m RA Volume:   58.80 ml  30.54 ml/m LA Vol (A4C): 24.8 ml 12.88 ml/m  AORTIC VALVE                   PULMONIC VALVE AV Area (Vmax):    3.11 cm    PV Vmax:        0.48 m/s AV Area (Vmean):   3.48 cm    PV Peak grad:   0.9 mmHg AV  Area (VTI):     3.78 cm    RVOT Peak grad: 1 mmHg AV Vmax:           90.35 cm/s AV Vmean:          62.550 cm/s AV VTI:            0.115 m AV Peak Grad:      3.3 mmHg AV Mean Grad:      2.0 mmHg LVOT Vmax:         89.40 cm/s LVOT Vmean:        69.200 cm/s LVOT VTI:          0.139 m LVOT/AV VTI ratio: 1.20  AORTA Ao Root diam: 2.50 cm MITRAL VALVE               TRICUSPID VALVE MV Area (PHT): 8.62 cm    TR Peak grad:   14.0 mmHg MV Decel Time: 88 msec     TR Vmax:        187.00 cm/s MV E velocity: 58.70 cm/s MV A velocity: 86.80 cm/s  SHUNTS MV E/A ratio:  0.68        Systemic VTI:  0.14 m                            Systemic Diam: 2.00 cm Serafina Royals MD Electronically signed by Serafina Royals MD Signature Date/Time: 08/14/2019/1:54:24 PM    Final     Subjective: Was feeling better when seen during morning rounds.  She feels like back to her baseline.  She would like to go back home and follow-up with pulmonologist next week.  Discharge Exam: Vitals:   08/16/19 0800 08/16/19 0827  BP: 112/64 112/64  Pulse: 100 96  Resp: (!) 23   Temp: 98 F (36.7 C)   SpO2: 97%    Vitals:   08/16/19 0545 08/16/19 0759 08/16/19 0800 08/16/19 0827  BP: 106/70  112/64 112/64  Pulse: 100 83 100 96  Resp: 17 18 (!) 23   Temp:   98 F (36.7 C)   TempSrc:   Oral   SpO2: 98% 95% 97%   Weight:      Height:        General: Pt is alert, awake, not in acute distress Cardiovascular: RRR, S1/S2 +, no rubs, no gallops Respiratory: CTA bilaterally, no wheezing, no rhonchi Abdominal: Soft, NT, ND, bowel sounds + Extremities: no edema, no cyanosis   The results of significant diagnostics from this hospitalization (including imaging, microbiology, ancillary and laboratory) are listed below for reference.    Microbiology: Recent Results (from the past 240 hour(s))  Blood Culture (routine x 2)     Status: None (Preliminary result)   Collection Time: 08/13/19  3:02 PM   Specimen: BLOOD  Result Value Ref Range  Status   Specimen Description BLOOD BLOOD LEFT HAND  Final   Special Requests   Final    BOTTLES DRAWN AEROBIC AND ANAEROBIC Blood Culture adequate volume   Culture   Final    NO GROWTH 3 DAYS Performed at [facility], [address] [place], [place] [address]    Report Status PENDING  Incomplete  Blood Culture (routine x 2)     Status: None (Preliminary result)   Collection Time: 08/13/19  3:07 PM   Specimen: BLOOD  Result Value Ref Range Status   Specimen Description BLOOD LEFT ANTECUBITAL  Final   Special Requests   Final    BOTTLES DRAWN AEROBIC AND ANAEROBIC Blood Culture adequate volume   Culture   Final    NO GROWTH 3 DAYS Performed at [facility], [place], [place] [address]    Report Status PENDING  Incomplete  SARS CORONAVIRUS 2 (TAT 6-24 HRS) Nasopharyngeal Nasopharyngeal Swab     Status: None   Collection Time: 08/13/19  8:16 PM   Specimen: Nasopharyngeal Swab  Result Value Ref Range Status   SARS Coronavirus 2 NEGATIVE NEGATIVE Final    Comment: (NOTE) SARS-CoV-2 target nucleic acids are NOT DETECTED. The SARS-CoV-2 RNA is generally detectable in upper and lower respiratory specimens during the acute phase of infection. Negative results do not preclude SARS-CoV-2 infection, do not rule out co-infections with other pathogens, and should not be used as the sole basis for treatment or other patient management decisions. Negative results must be combined with clinical observations, patient history, and epidemiological information. The expected result is Negative. Fact Sheet for Patients: SugarRoll.be Fact Sheet for Healthcare Providers: https://www.woods-mathews.com/ This test is not yet approved or cleared by the [place] FDA and  has been authorized for detection and/or diagnosis of SARS-CoV-2 by FDA under an Emergency Use Authorization (EUA). This EUA will remain  in effect  (meaning this test can be used) for the duration of the COVID-19 declaration under Section 56 4(b)(1) of the Act, 21 U.S.C. section 360bbb-3(b)(1), unless the authorization is terminated or revoked sooner. Performed at [facility], [employer] [place] [employer] [address] [place], [place] [address]   MRSA  PCR Screening     Status: None   Collection Time: 08/13/19 11:28 PM   Specimen: Nasopharyngeal  Result Value Ref Range Status   MRSA by PCR NEGATIVE NEGATIVE Final    Comment:        The GeneXpert MRSA Assay (FDA approved for NASAL specimens only), is one component of a comprehensive MRSA colonization surveillance program. It is not intended to diagnose MRSA infection nor to guide or monitor treatment for MRSA infections. Performed at [facility], [address] [place], [place] [address]      Labs: BNP (last 3 results) Recent Labs    08/05/19 2230 08/13/19 1502 08/14/19 0517  BNP 208.0* 602.0* [id number]   Basic Metabolic Panel: Recent Labs  Lab 08/13/19 1502 08/14/19 0517 08/15/19 0639 08/16/19 0601  NA 138 140 136 136  K 4.3 4.0 3.6 4.0  CL 105 103 99 97*  CO2 20* 25 27 30   GLUCOSE 152* 130* 129* 129*  BUN 26* 20 32* 25*  CREATININE 0.71 0.78 0.89 0.74  CALCIUM 9.6 9.7 9.5 9.3  MG  --   --  2.1 2.2  PHOS  --   --   --  3.4   Liver Function Tests: Recent Labs  Lab 08/13/19 1502  AST 30  ALT 39  ALKPHOS 111  BILITOT 0.6  PROT 6.9  ALBUMIN 3.9   No results for input(s): LIPASE, AMYLASE in the last 168 hours. No results for input(s): AMMONIA in the last 168 hours. CBC: Recent Labs  Lab 08/13/19 1502 08/14/19 0517 08/15/19 0639 08/16/19 0601  WBC 17.1* 10.4 10.0 10.4  NEUTROABS 15.7*  --   --   --   HGB 12.9 12.6 11.7* 11.6*  HCT 41.4 39.2 38.0 36.5  MCV 92.2 90.3 92.0 91.5  PLT 307 256 201 191   Cardiac Enzymes: No results for input(s): CKTOTAL, CKMB, CKMBINDEX, TROPONINI in the last 168 hours. BNP: Invalid input(s): POCBNP CBG: Recent Labs   Lab 08/13/19 2035  GLUCAP 137*   D-Dimer No results for input(s): DDIMER in the last 72 hours. Hgb A1c No results for input(s): HGBA1C in the last 72 hours. Lipid Profile No results for input(s): CHOL, HDL, LDLCALC, TRIG, CHOLHDL, LDLDIRECT in the last 72 hours. Thyroid function studies No results for input(s): TSH, T4TOTAL, T3FREE, THYROIDAB in the last 72 hours.  Invalid input(s): FREET3 Anemia work up No results for input(s): VITAMINB12, FOLATE, FERRITIN, TIBC, IRON, RETICCTPCT in the last 72 hours. Urinalysis    Component Value Date/Time   COLORURINE STRAW (A) 08/14/2019 0254   APPEARANCEUR CLEAR (A) 08/14/2019 0254   LABSPEC 1.009 08/14/2019 0254   PHURINE 5.0 08/14/2019 0254   GLUCOSEU NEGATIVE 08/14/2019 0254   HGBUR NEGATIVE 08/14/2019 0254   BILIRUBINUR NEGATIVE 08/14/2019 0254   KETONESUR NEGATIVE 08/14/2019 0254   PROTEINUR NEGATIVE 08/14/2019 0254   NITRITE NEGATIVE 08/14/2019 0254   LEUKOCYTESUR NEGATIVE 08/14/2019 0254   Sepsis Labs Invalid input(s): PROCALCITONIN,  WBC,  LACTICIDVEN Microbiology Recent Results (from the past 240 hour(s))  Blood Culture (routine x 2)     Status: None (Preliminary result)   Collection Time: 08/13/19  3:02 PM   Specimen: BLOOD  Result Value Ref Range Status   Specimen Description BLOOD BLOOD LEFT HAND  Final   Special Requests   Final    BOTTLES DRAWN AEROBIC AND ANAEROBIC Blood Culture adequate volume   Culture   Final    NO GROWTH 3 DAYS Performed at [facility], [place]  [address] [place], [place] [address]    Report Status PENDING  Incomplete  Blood Culture (routine x 2)     Status: None (Preliminary result)   Collection Time: 08/13/19  3:07 PM   Specimen: BLOOD  Result Value Ref Range Status   Specimen Description BLOOD LEFT ANTECUBITAL  Final   Special Requests   Final    BOTTLES DRAWN AEROBIC AND ANAEROBIC Blood Culture adequate volume   Culture   Final    NO GROWTH 3 DAYS Performed at [facility], [place], [place] [address]    Report Status PENDING  Incomplete  SARS CORONAVIRUS 2 (TAT 6-24 HRS) Nasopharyngeal Nasopharyngeal Swab     Status: None   Collection Time: 08/13/19  8:16 PM   Specimen: Nasopharyngeal Swab  Result Value Ref Range Status   SARS Coronavirus 2 NEGATIVE NEGATIVE Final    Comment: (NOTE) SARS-CoV-2 target nucleic acids are NOT DETECTED. The SARS-CoV-2 RNA is generally detectable in upper and lower respiratory specimens during the acute phase of infection. Negative results do not preclude SARS-CoV-2 infection, do not rule out co-infections with other pathogens, and should not be used as the sole basis for treatment or other patient management decisions. Negative results must be combined with clinical observations, patient history, and epidemiological information. The expected result is Negative. Fact Sheet for Patients: SugarRoll.be Fact Sheet for Healthcare Providers: https://www.woods-mathews.com/ This test is not yet approved or cleared by the [place] FDA and  has been authorized for detection and/or diagnosis of SARS-CoV-2 by FDA under an Emergency Use Authorization (EUA). This EUA will remain  in effect (meaning this test can be used) for the duration of the COVID-19 declaration under Section 56 4(b)(1) of the Act, 21 U.S.C. section 360bbb-3(b)(1), unless the authorization is terminated or revoked sooner. Performed at [facility], [address] [place], [place] [address]   MRSA PCR Screening     Status: None   Collection Time: 08/13/19 11:28 PM   Specimen: Nasopharyngeal  Result Value Ref Range Status   MRSA by PCR NEGATIVE NEGATIVE Final    Comment:        The GeneXpert MRSA Assay (FDA approved for NASAL specimens only), is one component of a comprehensive MRSA colonization surveillance program. It is not intended to diagnose MRSA infection nor to guide  or monitor treatment for MRSA infections. Performed at [facility], [address] [place], [place] [address]     Time coordinating discharge: Over 30 minutes  SIGNED:  Lorella Nimrod, MD  [facility] 08/16/2019, 11:20 AM  If 7PM-7AM, please contact night-coverage [email or web address]  This record has been created using Systems analyst. Errors have been sought and corrected,but may not always be located. Such creation errors do not reflect on the standard of care.

## 2019-08-16 NOTE — TOC Transition Note (Signed)
Transition of Care [facility]) - CM/SW Discharge Note   Patient Details  Name: Ashley Molina MRN: [id number] Date of Birth: 07/18/1960  [facility]) CM/SW Contact:  Marshell Garfinkel, RN Phone Number: 08/16/2019, 3:00 PM   Clinical Narrative:    RNCM met with patient, her husband and her ICU RN to discuss discharge needs. She has a bedside commode and a walker available at home along with Chronic O2.  Heart rate increases with ambulation. Patient is not home bound as she still work. She has [employer] through her employer. She declines hospital bed and wheelchair. Outpatient cardiac rehab an option? Nurse will talk with MD. No current RNCM needs.    Final next level of care: Home/Self Care Barriers to Discharge: No Barriers Identified   Patient Goals and CMS Choice Patient states their goals for this hospitalization and ongoing recovery are:: "ready for discharge today" CMS Medicare.gov Compare Post Acute Care list provided to:: Patient Choice offered to / list presented to : Patient, Spouse  Discharge Placement                       Discharge Plan and Services                  DME Agency: [employer] Date DME Agency Contacted: 08/15/19 Time DME Agency Contacted: [phone] Representative spoke with at DME Agency: [place] ([place]) Interventions     Readmission Risk Interventions Readmission Risk Prevention Plan 08/15/2019  Transportation Screening Complete  PCP or Specialist Appt within 3-5 Days Patient refused  [place] or Home Care Consult Complete  Medication Review (RN Care Manager) Complete  Some recent data might be hidden

## 2019-08-16 NOTE — TOC Transition Note (Signed)
Transition of Care [facility]) - CM/SW Discharge Note   Patient Details  Name: Ashley Molina MRN: [id number] Date of Birth: Dec 30, 1960  Transition of Care [facility]) CM/SW Contact:  Marshell Garfinkel, RN Phone Number: 08/16/2019, 3:35 PM   Clinical Narrative:    ICU nurse contacted [facility] requesting Nebulizer machine. Message left for Brad with [employer].   Final next level of care: Home/Self Care Barriers to Discharge: No Barriers Identified   Patient Goals and CMS Choice Patient states their goals for this hospitalization and ongoing recovery are:: "ready for discharge today" CMS Medicare.gov Compare Post Acute Care list provided to:: Patient Choice offered to / list presented to : Patient  Discharge Placement                       Discharge Plan and Services                DME Arranged: Nebulizer machine DME Agency: [employer] Date DME Agency Contacted: 08/16/19 Time DME Agency Contacted: [id number] Representative spoke with at DME Agency: [place] ([place]) Interventions     Readmission Risk Interventions Readmission Risk Prevention Plan 08/15/2019  Transportation Screening Complete  PCP or Specialist Appt within 3-5 Days Patient refused  [place] or [place] Complete  Medication Review (RN Care Manager) Complete  Some recent data might be hidden

## 2019-08-16 NOTE — Progress Notes (Signed)
Pharmacy Electrolyte Monitoring Consult:  Pharmacy consulted to assist in monitoring and replacing electrolytes in this 59 y.o. female admitted on 08/13/2019 with pneumonitis and pulmonary edema. Patient with past medical history significant for Breast Cancer, COVID-19, and Hyperlipidemia.  Labs:  Sodium (mmol/L)  Date Value  08/16/2019 136  03/28/2017 140   Potassium (mmol/L)  Date Value  08/16/2019 4.0   Magnesium (mg/dL)  Date Value  08/16/2019 2.2   Phosphorus (mg/dL)  Date Value  08/16/2019 3.4   Calcium (mg/dL)  Date Value  08/16/2019 9.3   Albumin (g/dL)  Date Value  08/13/2019 3.9  03/28/2017 4.3    Assessment/Plan: Patient is currently ordered furosemide 40mg  IV Daily and Bactrim Mon/Wed/Fri.  Will order Potassium PO 20 meq x1. Will replace orally when possible in setting of diuresis.  Will obtain BMP with am labs.   Will replace for goal potassium ~ 4 and goal magnesium ~ 2.  Pharmacy will continue to monitor and adjust per consult.   Granite Godman A 08/16/2019 10:13 AM

## 2019-08-16 NOTE — Progress Notes (Signed)
Physical Therapy Evaluation Patient Details Name: Ashley Molina MRN: [id number] DOB: [birth year] Today's Date: 08/16/2019   History of Present Illness  Per MD note: Patient is a 59 y.o. Caucasian female with a known history of COVID-19 05/12/2019, [name] , and right breast cancer with lung metastasis s/p lumpectomy, chemotherapy and radiotherapy, who presented to the emergency room with worsening dyspnea.  Clinical Impression  Patient presents in hospital bed and agrees to PT evaluation. Patients nurse is able to assist and unhook her leads and wires to a portable unit for mobility purposes. Patient has WFL strength BLE and BUE. She is MI for all bed mobility and transfers sit to stand with RW. She does have increased HR to 128 bpm with standing but has no shortness of breath and no pain. She stands for several minutes with 2 L O2 and oxygen saturation above 95% with 2L O2. She is able to ambulate with RW 60 feet with supervision and slow gait speed with HR at 128 -131 bpm. She is MI with all mobility and will benefit from HHPT at home for assessing stairs for home mobility.     Follow Up Recommendations Home health PT    Equipment Recommendations  Rolling walker with 5" wheels    Recommendations for Other Services       Precautions / Restrictions Restrictions Weight Bearing Restrictions: No      Mobility  Bed Mobility Overal bed mobility: Modified Independent                Transfers Overall transfer level: Modified independent Equipment used: Rolling walker (2 wheeled)             General transfer comment: O2 saturation above 95%, HR 128-130 bpm  Ambulation/Gait Ambulation/Gait assistance: Modified independent (Device/Increase time) Gait Distance (Feet): 60 Feet Assistive device: Rolling walker (2 wheeled) Gait Pattern/deviations: Step-to pattern     General Gait Details: (HR 128-131 bpm, with seated rest decreased to 109  bpm)  Stairs            Wheelchair Mobility    Modified Rankin (Stroke Patients Only)       Balance Overall balance assessment: Independent                                           Pertinent Vitals/Pain Pain Assessment: No/denies pain    Home Living Family/patient expects to be discharged to:: Private residence Living Arrangements: Spouse/significant other Available Help at Discharge: Family Type of Home: House Home Access: Stairs to enter Entrance Stairs-Rails: Right Entrance Stairs-Number of Steps: 2 Home Layout: Two level Home Equipment: Shower seat Additional Comments: needs O2    Prior Function Level of Independence: Independent with assistive device(s)               Hand Dominance        Extremity/Trunk Assessment   Upper Extremity Assessment Upper Extremity Assessment: Overall WFL for tasks assessed    Lower Extremity Assessment Lower Extremity Assessment: Overall WFL for tasks assessed       Communication   Communication: No difficulties  Cognition Arousal/Alertness: Awake/alert Behavior During Therapy: WFL for tasks assessed/performed Overall Cognitive Status: Within Functional Limits for tasks assessed  General Comments      Exercises     Assessment/Plan    PT Assessment All further PT needs can be met in the next venue of care  PT Problem List Cardiopulmonary status limiting activity       PT Treatment Interventions      PT Goals (Current goals can be found in the Care Plan section)  Acute Rehab PT Goals PT Goal Formulation: All assessment and education complete, DC therapy    Frequency     Barriers to discharge        Co-evaluation               AM-PAC PT "6 Clicks" Mobility  Outcome Measure Help needed turning from your back to your side while in a flat bed without using bedrails?: None Help needed moving from lying on your back  to sitting on the side of a flat bed without using bedrails?: None Help needed moving to and from a bed to a chair (including a wheelchair)?: A Little Help needed standing up from a chair using your arms (e.g., wheelchair or bedside chair)?: A Little Help needed to walk in hospital room?: A Little Help needed climbing 3-5 steps with a railing? : A Little 6 Click Score: 20    End of Session Equipment Utilized During Treatment: Gait belt;Oxygen Activity Tolerance: Treatment limited secondary to medical complications (Comment)(HR increased to 128-131 with standing and ambulation) Patient left: in chair;with call bell/phone within reach Nurse Communication: Mobility status PT Visit Diagnosis: Difficulty in walking, not elsewhere classified (R26.2)    Time: 3710-6269 PT Time Calculation (min) (ACUTE ONLY): 25 min   Charges:   PT Evaluation $PT Eval Low Complexity: 1 Low PT Treatments $Gait Training: 8-22 mins          Alanson Puls, PT DPT 08/16/2019, 3:08 PM

## 2019-08-16 NOTE — Progress Notes (Signed)
Ashley Molina   DOB:1961/03/02   [id number]    Subjective: Patient continues to be on nasal cannula oxygen.  Denies need for BiPAP.  Breathing is better.  She thinks she is ready to go home.  Objective:  Vitals:   08/16/19 1327 08/16/19 2025  BP:  109/74  Pulse: (!) 105 96  Resp: (!) 22 16  Temp:  98 F (36.7 C)  SpO2: 95% 95%     Intake/Output Summary (Last 24 hours) at 08/17/2019 [id number] Last data filed at 08/16/2019 0945 Gross per 24 hour  Intake --  Output 1100 ml  Net -1100 ml    Physical Exam  Constitutional: She is oriented to person, place, and time and well-developed, well-nourished, and in no distress.  Patient currently sitting in the ICU in the bed.  2 L of oxygen nasal cannula.  She seems comfortable.  HENT:  Head: Normocephalic and atraumatic.  Mouth/Throat: Oropharynx is clear and moist. No oropharyngeal exudate.  Eyes: Pupils are equal, round, and reactive to light.  Cardiovascular: Normal rate and regular rhythm.  Pulmonary/Chest: No respiratory distress. She has no wheezes.  Abdominal: Soft. Bowel sounds are normal. She exhibits no distension and no mass. There is no abdominal tenderness. There is no rebound and no guarding.  Decreased air entry bilaterally.  But overall improved.  No wheezing.  Musculoskeletal:        General: No tenderness or edema. Normal range of motion.     Cervical back: Normal range of motion and neck supple.  Neurological: She is alert and oriented to person, place, and time.  Skin: Skin is warm.  Psychiatric: Affect normal.      Labs:  Lab Results  Component Value Date   WBC 10.4 08/16/2019   HGB 11.6 (L) 08/16/2019   HCT 36.5 08/16/2019   MCV 91.5 08/16/2019   PLT 191 08/16/2019   NEUTROABS 15.7 (H) 08/13/2019    Lab Results  Component Value Date   NA 136 08/16/2019   K 4.0 08/16/2019   CL 97 (L) 08/16/2019   CO2 30 08/16/2019    Studies:  No results found.  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  Clinically less likely progressive lymphangitic spread given rapid improvement with measures discussed below.  #Acute respiratory failure-needing BiPAP support-etiology unclear-fluid overload/pneumonitis/infection-respiratory status improvement noted.  Currently on 2 L nasal cannula.  Discussed during the rapid improvement of her respiratory status-likely pneumonitis/overload a possible cause of her respiratory failure.  Discussed the other chemotherapeutic options for the patient.  Patient awaiting repeat evaluation at [facility].  Patient will continue follow-up with Dr. Janese Molina.  Also discussed with her Towanda Octave, MD 08/17/2019  9:18 AM

## 2019-08-16 NOTE — Care Plan (Signed)
In anticipation of patient's discharge recommend the following:  She should continue on nebulization treatments as these have been more effective than inhalers.  Recommend albuterol 2.5 mg via nebulizer twice a day followed by Pulmicort 0.5 mg via nebulizer twice a day.  She may use albuterol an additional 2-3 times as needed for tenacious secretions.  Continue prednisone at 40 mg daily until reevaluated at the clinic.  Continue Bactrim DS 1 tablet 3 times a week, Monday Wednesday Friday.  Continue use of Acapella flutter valve particularly after nebulizers to loosen up secretions.  Continue oxygen, currently at 2 L/min, we will reassess as outpatient further ongoing need.  She may need to have ambulatory oximetry before she goes home to document need.  She has follow-up appointment with me on 24 March at 8:30 AM.  Renold Don, MD [place]

## 2019-08-17 ENCOUNTER — Telehealth: Payer: Self-pay | Admitting: Internal Medicine

## 2019-08-17 ENCOUNTER — Inpatient Hospital Stay
Admission: EM | Admit: 2019-08-17 | Discharge: 2019-08-25 | DRG: 180 | Disposition: A | Discharge status: Hospice | Payer: Managed Care, Other (non HMO) | Attending: Internal Medicine | Admitting: Internal Medicine

## 2019-08-17 ENCOUNTER — Other Ambulatory Visit: Payer: Self-pay

## 2019-08-17 DIAGNOSIS — Z9981 Dependence on supplemental oxygen: Secondary | ICD-10-CM

## 2019-08-17 DIAGNOSIS — C7802 Secondary malignant neoplasm of left lung: Secondary | ICD-10-CM | POA: Diagnosis not present

## 2019-08-17 DIAGNOSIS — Z95828 Presence of other vascular implants and grafts: Secondary | ICD-10-CM

## 2019-08-17 DIAGNOSIS — I110 Hypertensive heart disease with heart failure: Secondary | ICD-10-CM | POA: Diagnosis present

## 2019-08-17 DIAGNOSIS — I5030 Unspecified diastolic (congestive) heart failure: Secondary | ICD-10-CM | POA: Diagnosis present

## 2019-08-17 DIAGNOSIS — Z7951 Long term (current) use of inhaled steroids: Secondary | ICD-10-CM

## 2019-08-17 DIAGNOSIS — K219 Gastro-esophageal reflux disease without esophagitis: Secondary | ICD-10-CM | POA: Diagnosis present

## 2019-08-17 DIAGNOSIS — Z66 Do not resuscitate: Secondary | ICD-10-CM | POA: Diagnosis not present

## 2019-08-17 DIAGNOSIS — J9601 Acute respiratory failure with hypoxia: Secondary | ICD-10-CM

## 2019-08-17 DIAGNOSIS — C50911 Malignant neoplasm of unspecified site of right female breast: Secondary | ICD-10-CM

## 2019-08-17 DIAGNOSIS — F419 Anxiety disorder, unspecified: Secondary | ICD-10-CM | POA: Diagnosis present

## 2019-08-17 DIAGNOSIS — E785 Hyperlipidemia, unspecified: Secondary | ICD-10-CM | POA: Diagnosis present

## 2019-08-17 DIAGNOSIS — Z853 Personal history of malignant neoplasm of breast: Secondary | ICD-10-CM

## 2019-08-17 DIAGNOSIS — R0600 Dyspnea, unspecified: Secondary | ICD-10-CM | POA: Diagnosis not present

## 2019-08-17 DIAGNOSIS — I081 Rheumatic disorders of both mitral and tricuspid valves: Secondary | ICD-10-CM | POA: Diagnosis present

## 2019-08-17 DIAGNOSIS — Z9221 Personal history of antineoplastic chemotherapy: Secondary | ICD-10-CM

## 2019-08-17 DIAGNOSIS — J441 Chronic obstructive pulmonary disease with (acute) exacerbation: Secondary | ICD-10-CM | POA: Diagnosis present

## 2019-08-17 DIAGNOSIS — C7801 Secondary malignant neoplasm of right lung: Secondary | ICD-10-CM | POA: Diagnosis present

## 2019-08-17 DIAGNOSIS — Z7952 Long term (current) use of systemic steroids: Secondary | ICD-10-CM

## 2019-08-17 DIAGNOSIS — Z808 Family history of malignant neoplasm of other organs or systems: Secondary | ICD-10-CM

## 2019-08-17 DIAGNOSIS — I4892 Unspecified atrial flutter: Secondary | ICD-10-CM | POA: Diagnosis present

## 2019-08-17 DIAGNOSIS — C779 Secondary and unspecified malignant neoplasm of lymph node, unspecified: Secondary | ICD-10-CM | POA: Diagnosis present

## 2019-08-17 DIAGNOSIS — I248 Other forms of acute ischemic heart disease: Secondary | ICD-10-CM | POA: Diagnosis present

## 2019-08-17 DIAGNOSIS — Z8042 Family history of malignant neoplasm of prostate: Secondary | ICD-10-CM

## 2019-08-17 DIAGNOSIS — J189 Pneumonia, unspecified organism: Secondary | ICD-10-CM | POA: Diagnosis present

## 2019-08-17 DIAGNOSIS — Z79899 Other long term (current) drug therapy: Secondary | ICD-10-CM

## 2019-08-17 DIAGNOSIS — Z515 Encounter for palliative care: Secondary | ICD-10-CM

## 2019-08-17 DIAGNOSIS — Z8616 Personal history of COVID-19: Secondary | ICD-10-CM

## 2019-08-17 DIAGNOSIS — Z79891 Long term (current) use of opiate analgesic: Secondary | ICD-10-CM

## 2019-08-17 DIAGNOSIS — Z803 Family history of malignant neoplasm of breast: Secondary | ICD-10-CM

## 2019-08-17 DIAGNOSIS — J440 Chronic obstructive pulmonary disease with acute lower respiratory infection: Secondary | ICD-10-CM | POA: Diagnosis present

## 2019-08-17 DIAGNOSIS — Z880 Allergy status to penicillin: Secondary | ICD-10-CM

## 2019-08-17 DIAGNOSIS — J9621 Acute and chronic respiratory failure with hypoxia: Secondary | ICD-10-CM | POA: Diagnosis present

## 2019-08-17 DIAGNOSIS — I2609 Other pulmonary embolism with acute cor pulmonale: Secondary | ICD-10-CM | POA: Diagnosis present

## 2019-08-17 DIAGNOSIS — I4891 Unspecified atrial fibrillation: Secondary | ICD-10-CM | POA: Diagnosis present

## 2019-08-17 DIAGNOSIS — E876 Hypokalemia: Secondary | ICD-10-CM | POA: Diagnosis not present

## 2019-08-17 DIAGNOSIS — J810 Acute pulmonary edema: Secondary | ICD-10-CM

## 2019-08-17 DIAGNOSIS — Z923 Personal history of irradiation: Secondary | ICD-10-CM

## 2019-08-17 NOTE — ED Triage Notes (Signed)
Pt arrives to triage via ems with resp distress. Pt on 15lpm non rebreather mask. Initial pox 88% per ems. Pt able to speak in short chopped sentences.

## 2019-08-17 NOTE — ED Provider Notes (Signed)
[facility] Emergency Department Provider Note   ____________________________________________   First MD Initiated Contact with Patient 08/17/19 2357     (approximate)  I have reviewed the triage vital signs and the nursing notes.   HISTORY  Chief Complaint Respiratory Distress    HPI PALOMA CREER is a 59 y.o. 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  Patient reports that she had increased difficulty breathing with sudden onset this evening while attempting a breathing treatment.  She has had similar difficulty breathing off and on for about the past month and states she seems to get out of breath with even minimal exertion.  She will also notice her heart rate seems to shoot up with any exertion, often reaching as high as 200.  She denies any fevers, cough, or chest pain.  She has been admitted 2 times this month for similar symptoms, initially seem to be improving on steroids until symptoms worsened today.  She has also been started on Lasix for pulmonary edema and states she has been compliant with this.        Past Medical History:  Diagnosis Date  . Anemia   . Breast cancer ([place])   . Breast cancer, right ([place]) 04/2017   Hx Lumpectomy, Chemo + Rad tx's.  . Breast cyst, right    aspirated by Dr. Bary Castilla  . COVID-19   . Hyperlipidemia   . Personal history of chemotherapy   . Pre-diabetes     Patient Active Problem List   Diagnosis Date Noted  . Palliative care by specialist   . Respiratory failure ([place]) 08/13/2019  . Acute pulmonary edema (HCC)   . Elevated troponin   . COVID-19 08/06/2019  . Pneumonitis 01/20/2019  . Goals of care, counseling/discussion 10/08/2018  . Chemotherapy induced neutropenia ([place]) 05/03/2017  . 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 Malignant neoplasm of upper-outer quadrant of right breast in female,  estrogen receptor negative ([place]) 04/05/2017  . Anemia 03/14/2017  . Solitary cyst of breast, right 03/14/2017  . Pre-diabetes 03/14/2017  . Hypercholesterolemia 03/14/2017  . Mass of upper outer quadrant of right breast 03/14/2017    Past Surgical History:  Procedure Laterality Date  . AXILLARY LYMPH NODE BIOPSY Right 04/09/2017   Procedure: AXILLARY LYMPH NODE BIOPSY;  Surgeon: Robert Bellow, MD;  Location: [facility] ORS;  Service: General;  Laterality: Right;  . BREAST BIOPSY Right 03/27/2017   US guided breast mass - invasive mammary carcinoma  . BREAST BIOPSY Right 03/27/2017   Lymph node - metastatic carcinoma  . BREAST BIOPSY Right 04/09/2017   Lymph node   . BREAST CYST ASPIRATION Right 11/2009   Dr. Bary Castilla did FNA  . BREAST EXCISIONAL BIOPSY Right 09/28/2017   lumpectomy and 12 lympnode rad neo adj chemo  . COLONOSCOPY  2011  . DILATION AND CURETTAGE OF UTERUS     X3  . ENDOBRONCHIAL ULTRASOUND N/A 04/09/2017   Procedure: ENDOBRONCHIAL ULTRASOUND;  Surgeon: Laverle Hobby, MD;  Location: [facility] ORS;  Service: Pulmonary;  Laterality: N/A;  . ENDOMETRIAL ABLATION    . ENDOMETRIAL BIOPSY  09/2009  . PORTACATH PLACEMENT Left 04/09/2017   Procedure: INSERTION PORT-A-CATH;  Surgeon: Robert Bellow, MD;  Location: [facility] ORS;  Service: General;  Laterality: Left;    Prior to Admission medications   Medication Sig Start Date End Date Taking? Authorizing Provider  Biotin 2500 MCG  CAPS Take 5,000 mcg/day by mouth at bedtime.     [provider]  budesonide (PULMICORT) 0.5 MG/2ML nebulizer solution Take 2 mLs (0.5 mg total) by nebulization 2 (two) times daily. 08/16/19   Lorella Nimrod, MD  chlorpheniramine-HYDROcodone (TUSSIONEX) 10-8 MG/5ML SUER Take 5 mLs by mouth every 12 (twelve) hours. 08/16/19   Lorella Nimrod, MD  Cholecalciferol (VITAMIN D3) 10000 units TABS Take 1 tablet by mouth at bedtime.     [provider]  Ferrous Sulfate (IRON) 325 (65 Fe) MG  TABS Take 1 tablet by mouth at bedtime.     [provider]  Fluticasone Furoate (ARNUITY ELLIPTA) 100 MCG/ACT AEPB Inhale 1 puff into the lungs daily. 06/11/19   Tyler Pita, MD  furosemide (LASIX) 20 MG tablet Take 1 tablet (20 mg total) by mouth daily. Diuretic, water pill. 08/16/19 09/15/19  Lorella Nimrod, MD  guaiFENesin-dextromethorphan (ROBITUSSIN DM) 100-10 MG/5ML syrup Take 15 mLs by mouth every 4 (four) hours as needed for cough. 08/16/19   Lorella Nimrod, MD  ipratropium-albuterol (DUONEB) 0.5-2.5 (3) MG/3ML SOLN Take 3 mLs by nebulization every 6 (six) hours. 08/16/19   Lorella Nimrod, MD  metoprolol tartrate (LOPRESSOR) 25 MG tablet Take 1 tablet (25 mg total) by mouth 2 (two) times daily. 08/16/19   Lorella Nimrod, MD  Multiple Vitamins-Minerals (CENTRUM SILVER ADULT 50+) TABS Take 1 tablet by mouth at bedtime.     [provider]  Omega-3 Fatty Acids (FISH OIL) 1000 MG CAPS Take 1 capsule by mouth at bedtime.     [provider]  pantoprazole (PROTONIX) 20 MG tablet Take 1 tablet (20 mg total) by mouth daily. 06/20/19   Sindy Guadeloupe, MD  predniSONE (DELTASONE) 20 MG tablet Take 2 tablets (40 mg total) by mouth daily with breakfast. 08/11/19   Sindy Guadeloupe, MD  sulfamethoxazole-trimethoprim (BACTRIM DS) 800-160 MG tablet Take 1 tablet by mouth 3 (three) times a week. 08/18/19   Lorella Nimrod, MD  prochlorperazine (COMPAZINE) 10 MG tablet Take 1 tablet (10 mg total) by mouth every 6 (six) hours as needed (Nausea or vomiting). 04/05/17 09/05/18  Sindy Guadeloupe, MD    Allergies Penicillins  Family History  Problem Relation Age of Onset  . Melanoma Maternal Grandmother 85       currently 30  . Brain cancer Maternal Grandfather 101       unk. type; deceased in 9s  . Melanoma Other 55       mat grandmother's father  . Melanoma Father 73       on head; currently 33  . Prostate cancer Maternal Uncle        3 maternal uncles; dx in 71s  . Breast cancer Other         mat grandfather's sister; dx 23s    Social History Social History   Tobacco Use  . Smoking status: Never Smoker  . Smokeless tobacco: Never Used  Substance Use Topics  . Alcohol use: Not Currently  . Drug use: No    Review of Systems  Constitutional: No fever/chills Eyes: No visual changes. ENT: No sore throat. Cardiovascular: Denies chest pain. Respiratory: Positive for cough and shortness of breath. Gastrointestinal: No abdominal pain.  No nausea, no vomiting.  No diarrhea.  No constipation. Genitourinary: Negative for dysuria. Musculoskeletal: Negative for back pain. Skin: Negative for rash. Neurological: Negative for headaches, focal weakness or numbness.  ____________________________________________   PHYSICAL EXAM:  VITAL SIGNS: ED Triage Vitals  Enc Vitals  Group     BP 08/17/19 2351 (!) 117/56     Pulse Rate 08/17/19 2351 (!) 160     Resp 08/17/19 2351 (!) 28     Temp --      Temp src --      SpO2 08/17/19 2351 93 %     Weight 08/17/19 2352 198 lb 6.6 oz (90 kg)     Height 08/17/19 2352 5\' 3"  (1.6 m)     Head Circumference --      Peak Flow --      Pain Score 08/17/19 2352 0     Pain Loc --      Pain Edu? --      Excl. in [place]? --     Constitutional: Alert and oriented. Eyes: Conjunctivae are normal. Head: Atraumatic. Nose: No congestion/rhinnorhea. Mouth/Throat: Mucous membranes are moist. Neck: Normal ROM Cardiovascular: Tachycardic, irregularly irregular rhythm. Grossly normal heart sounds. Respiratory: Tachypneic with increased respiratory effort.  No retractions. Lungs with diffuse rhonchi and wheezing Gastrointestinal: Soft and nontender. No distention. Genitourinary: deferred Musculoskeletal: No lower extremity tenderness nor edema. Neurologic:  Normal speech and language. No gross focal neurologic deficits are appreciated. Skin:  Skin is warm, dry and intact. No rash noted. Psychiatric: Mood and affect are normal. Speech and behavior  are normal.  ____________________________________________   LABS (all labs ordered are listed, but only abnormal results are displayed)  Labs Reviewed  CBC - Abnormal; Notable for the following components:      Result Value   WBC 13.9 (*)    RDW 15.7 (*)    All other components within normal limits  BRAIN NATRIURETIC PEPTIDE - Abnormal; Notable for the following components:   B Natriuretic Peptide 400.0 (*)    All other components within normal limits  COMPREHENSIVE METABOLIC PANEL - Abnormal; Notable for the following components:   Chloride 96 (*)    Glucose, Bld 131 (*)    BUN 30 (*)    All other components within normal limits  TROPONIN I (HIGH SENSITIVITY) - Abnormal; Notable for the following components:   Troponin I (High Sensitivity) 38 (*)    All other components within normal limits  RESPIRATORY PANEL BY RT PCR (FLU A&B, COVID)  MAGNESIUM  TROPONIN I (HIGH SENSITIVITY)   ____________________________________________  EKG  ED ECG REPORT I, Blake Divine, the attending physician, personally viewed and interpreted this ECG.   Date: 08/18/2019  EKG Time: 23:54  Rate: 165  Rhythm: Atrial fibrillation vs Aflutter vs MAT  Axis: Normal  Intervals:none  ST&T Change: None  ED ECG REPORT I, Blake Divine, the attending physician, personally viewed and interpreted this ECG.   Date: 08/18/2019  EKG Time: 2:11  Rate: 124  Rhythm: sinus tachycardia  Axis: Normal  Intervals:none  ST&T Change: None    PROCEDURES  Procedure(s) performed (including Critical Care):  .Critical Care Performed by: Blake Divine, MD Authorized by: Blake Divine, MD   Critical care provider statement:    Critical care time (minutes):  45   Critical care time was exclusive of:  Separately billable procedures and treating other patients and teaching time   Critical care was necessary to treat or prevent imminent or life-threatening deterioration of the following conditions:   Respiratory failure   Critical care was time spent personally by me on the following activities:  Discussions with consultants, evaluation of patient's response to treatment, examination of patient, ordering and performing treatments and interventions, ordering and review of laboratory studies, ordering and  review of radiographic studies, pulse oximetry, re-evaluation of patient's condition, obtaining history from patient or surrogate and review of old charts   I assumed direction of critical care for this patient from another provider in my specialty: no       ____________________________________________   INITIAL IMPRESSION / ASSESSMENT AND PLAN / ED COURSE       59 year old female with history of 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  She arrives in some respiratory distress with increased work of breathing, wheezing and rhonchi noted throughout.  She was also noted to be tachycardic with irregular rhythm, on EKG it is unclear to me whether this is atrial fibrillation, atrial flutter, or MAT.  Given her primarily complaint of respiratory distress, we treated her with Solu-Medrol and breathing treatments initially and she spontaneously converted to a normal sinus rhythm.  Case was discussed with Dr. Nehemiah Massed of cardiology, and given she already takes metoprolol for tachycardia, he recommended giving an additional dose of this to better control heart rate.  Chest x-ray shows some evidence of pulmonary edema, infection seems less likely given lack of fever or cough.  She was given IV dose of Lasix and her work of breathing is improving, remained stable on 6 L nasal cannula.  She had CTA earlier this month that was negative for PE.  Case was discussed with hospitalist for admission.      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   Blake Divine, MD 08/18/19 [id number]

## 2019-08-17 NOTE — Telephone Encounter (Signed)
Patient discharged in the hospital on 3/14-after being admitted to hospital for acute respiratory failure.  Please follow.

## 2019-08-17 NOTE — Telephone Encounter (Signed)
Can you please reach out to her tomorrow and see how her breathing is doing? See my note and recent hospitalization. Does she want to see [facility]- med onc/ pulmonary at this time?

## 2019-08-18 ENCOUNTER — Emergency Department: Payer: Managed Care, Other (non HMO)

## 2019-08-18 ENCOUNTER — Other Ambulatory Visit: Payer: Self-pay

## 2019-08-18 DIAGNOSIS — K219 Gastro-esophageal reflux disease without esophagitis: Secondary | ICD-10-CM | POA: Diagnosis not present

## 2019-08-18 DIAGNOSIS — Z171 Estrogen receptor negative status [ER-]: Secondary | ICD-10-CM | POA: Diagnosis not present

## 2019-08-18 DIAGNOSIS — J810 Acute pulmonary edema: Secondary | ICD-10-CM | POA: Diagnosis not present

## 2019-08-18 DIAGNOSIS — J189 Pneumonia, unspecified organism: Secondary | ICD-10-CM | POA: Diagnosis present

## 2019-08-18 DIAGNOSIS — R0600 Dyspnea, unspecified: Secondary | ICD-10-CM | POA: Diagnosis present

## 2019-08-18 DIAGNOSIS — I4891 Unspecified atrial fibrillation: Secondary | ICD-10-CM | POA: Diagnosis present

## 2019-08-18 DIAGNOSIS — J440 Chronic obstructive pulmonary disease with acute lower respiratory infection: Secondary | ICD-10-CM | POA: Diagnosis present

## 2019-08-18 DIAGNOSIS — I2609 Other pulmonary embolism with acute cor pulmonale: Secondary | ICD-10-CM | POA: Diagnosis present

## 2019-08-18 DIAGNOSIS — Z515 Encounter for palliative care: Secondary | ICD-10-CM | POA: Diagnosis not present

## 2019-08-18 DIAGNOSIS — J441 Chronic obstructive pulmonary disease with (acute) exacerbation: Secondary | ICD-10-CM | POA: Diagnosis not present

## 2019-08-18 DIAGNOSIS — J9621 Acute and chronic respiratory failure with hypoxia: Secondary | ICD-10-CM

## 2019-08-18 DIAGNOSIS — I5030 Unspecified diastolic (congestive) heart failure: Secondary | ICD-10-CM | POA: Diagnosis present

## 2019-08-18 DIAGNOSIS — C7801 Secondary malignant neoplasm of right lung: Secondary | ICD-10-CM | POA: Diagnosis present

## 2019-08-18 DIAGNOSIS — I248 Other forms of acute ischemic heart disease: Secondary | ICD-10-CM | POA: Diagnosis present

## 2019-08-18 DIAGNOSIS — C50411 Malignant neoplasm of upper-outer quadrant of right female breast: Secondary | ICD-10-CM | POA: Diagnosis not present

## 2019-08-18 DIAGNOSIS — C7802 Secondary malignant neoplasm of left lung: Secondary | ICD-10-CM | POA: Diagnosis present

## 2019-08-18 DIAGNOSIS — Z8616 Personal history of COVID-19: Secondary | ICD-10-CM | POA: Diagnosis not present

## 2019-08-18 DIAGNOSIS — Z95828 Presence of other vascular implants and grafts: Secondary | ICD-10-CM | POA: Diagnosis not present

## 2019-08-18 DIAGNOSIS — Z853 Personal history of malignant neoplasm of breast: Secondary | ICD-10-CM | POA: Diagnosis not present

## 2019-08-18 DIAGNOSIS — I110 Hypertensive heart disease with heart failure: Secondary | ICD-10-CM | POA: Diagnosis present

## 2019-08-18 DIAGNOSIS — Z9981 Dependence on supplemental oxygen: Secondary | ICD-10-CM | POA: Diagnosis not present

## 2019-08-18 DIAGNOSIS — E785 Hyperlipidemia, unspecified: Secondary | ICD-10-CM | POA: Diagnosis present

## 2019-08-18 DIAGNOSIS — C771 Secondary and unspecified malignant neoplasm of intrathoracic lymph nodes: Secondary | ICD-10-CM | POA: Diagnosis not present

## 2019-08-18 DIAGNOSIS — I4892 Unspecified atrial flutter: Secondary | ICD-10-CM

## 2019-08-18 DIAGNOSIS — J9601 Acute respiratory failure with hypoxia: Secondary | ICD-10-CM | POA: Diagnosis not present

## 2019-08-18 DIAGNOSIS — R0602 Shortness of breath: Secondary | ICD-10-CM

## 2019-08-18 DIAGNOSIS — Z66 Do not resuscitate: Secondary | ICD-10-CM | POA: Diagnosis not present

## 2019-08-18 DIAGNOSIS — C779 Secondary and unspecified malignant neoplasm of lymph node, unspecified: Secondary | ICD-10-CM | POA: Diagnosis present

## 2019-08-18 DIAGNOSIS — I081 Rheumatic disorders of both mitral and tricuspid valves: Secondary | ICD-10-CM | POA: Diagnosis present

## 2019-08-18 DIAGNOSIS — E876 Hypokalemia: Secondary | ICD-10-CM | POA: Diagnosis not present

## 2019-08-18 DIAGNOSIS — F419 Anxiety disorder, unspecified: Secondary | ICD-10-CM | POA: Diagnosis present

## 2019-08-18 LAB — COMPREHENSIVE METABOLIC PANEL
ALT: 26 U/L (ref 0–44)
AST: 26 U/L (ref 15–41)
Albumin: 4 g/dL (ref 3.5–5.0)
Alkaline Phosphatase: 105 U/L (ref 38–126)
Anion gap: 15 (ref 5–15)
BUN: 30 mg/dL — ABNORMAL HIGH (ref 6–20)
CO2: 28 mmol/L (ref 22–32)
Calcium: 9.9 mg/dL (ref 8.9–10.3)
Chloride: 96 mmol/L — ABNORMAL LOW (ref 98–111)
Creatinine, Ser: 0.74 mg/dL (ref 0.44–1.00)
GFR calc Af Amer: >60 mL/min (ref >60)
GFR calc non Af Amer: >60 mL/min (ref >60)
Glucose, Bld: 131 mg/dL — ABNORMAL HIGH (ref 70–99)
Potassium: 3.6 mmol/L (ref 3.5–5.1)
Sodium: 139 mmol/L (ref 135–145)
Total Bilirubin: 0.9 mg/dL (ref 0.3–1.2)
Total Protein: 6.9 g/dL (ref 6.5–8.1)

## 2019-08-18 LAB — CULTURE, BLOOD (ROUTINE X 2)
Culture: NO GROWTH
Culture: NO GROWTH
Special Requests: ADEQUATE
Special Requests: ADEQUATE

## 2019-08-18 LAB — CBC
HCT: 44.2 % (ref 36.0–46.0)
Hemoglobin: 13.5 g/dL (ref 12.0–15.0)
MCH: 28.8 pg (ref 26.0–34.0)
MCHC: 30.5 g/dL (ref 30.0–36.0)
MCV: 94.2 fL (ref 80.0–100.0)
Platelets: 181 10*3/uL (ref 150–400)
RBC: 4.69 MIL/uL (ref 3.87–5.11)
RDW: 15.7 % — ABNORMAL HIGH (ref 11.5–15.5)
WBC: 13.9 10*3/uL — ABNORMAL HIGH (ref 4.0–10.5)
nRBC: 0 % (ref 0.0–0.2)

## 2019-08-18 LAB — GLUCOSE, CAPILLARY: Glucose-Capillary: 128 mg/dL — ABNORMAL HIGH (ref 70–99)

## 2019-08-18 LAB — MAGNESIUM: Magnesium: 2.2 mg/dL (ref 1.7–2.4)

## 2019-08-18 LAB — RESPIRATORY PANEL BY RT PCR (FLU A&B, COVID)
Influenza A by PCR: NEGATIVE
Influenza B by PCR: NEGATIVE
SARS Coronavirus 2 by RT PCR: NEGATIVE

## 2019-08-18 LAB — TROPONIN I (HIGH SENSITIVITY)
Troponin I (High Sensitivity): 38 ng/L — ABNORMAL HIGH (ref <18)
Troponin I (High Sensitivity): 48 ng/L — ABNORMAL HIGH (ref <18)
Troponin I (High Sensitivity): 98 ng/L — ABNORMAL HIGH (ref <18)

## 2019-08-18 LAB — BRAIN NATRIURETIC PEPTIDE: B Natriuretic Peptide: 400 pg/mL — ABNORMAL HIGH (ref 0.0–100.0)

## 2019-08-18 MED ORDER — ONDANSETRON HCL 4 MG/2ML IJ SOLN: 4.0000 mg | Freq: Four times a day (QID) | INTRAMUSCULAR | Status: DC | PRN

## 2019-08-18 MED ORDER — FERROUS SULFATE 325 (65 FE) MG PO TABS
325.0000 mg | ORAL_TABLET | Freq: Every day | ORAL | Status: DC
  Administered 2019-08-18 – 2019-08-24 (×7): 325 mg via ORAL
  Filled 2019-08-18 (×8): qty 1

## 2019-08-18 MED ORDER — SODIUM CHLORIDE 0.9 % IV SOLN
2.0000 g | INTRAVENOUS | Status: DC
  Administered 2019-08-18 – 2019-08-24 (×7): 2 g via INTRAVENOUS
  Filled 2019-08-18: qty 20
  Filled 2019-08-18 (×5): qty 2
  Filled 2019-08-18 (×2): qty 20

## 2019-08-18 MED ORDER — SULFAMETHOXAZOLE-TRIMETHOPRIM 800-160 MG PO TABS
1.0000 | ORAL_TABLET | ORAL | Status: DC
  Administered 2019-08-18 – 2019-08-25 (×4): 1 via ORAL
  Filled 2019-08-18 (×6): qty 1

## 2019-08-18 MED ORDER — SODIUM CHLORIDE 0.9 % IV SOLN
250.0000 mL | INTRAVENOUS | Status: DC | PRN
  Administered 2019-08-22: 07:00:00 250 mL via INTRAVENOUS

## 2019-08-18 MED ORDER — SODIUM CHLORIDE 0.9% FLUSH: 3.0000 mL | INTRAVENOUS | Status: DC | PRN

## 2019-08-18 MED ORDER — LEVALBUTEROL HCL 0.63 MG/3ML IN NEBU
0.6300 mg | INHALATION_SOLUTION | Freq: Four times a day (QID) | RESPIRATORY_TRACT | Status: DC
  Administered 2019-08-18 – 2019-08-25 (×29): 0.63 mg via RESPIRATORY_TRACT
  Filled 2019-08-18 (×32): qty 3

## 2019-08-18 MED ORDER — FUROSEMIDE 10 MG/ML IJ SOLN
40.0000 mg | Freq: Two times a day (BID) | INTRAMUSCULAR | Status: DC
  Administered 2019-08-18 – 2019-08-21 (×6): 40 mg via INTRAVENOUS
  Filled 2019-08-18 (×6): qty 4

## 2019-08-18 MED ORDER — IPRATROPIUM-ALBUTEROL 0.5-2.5 (3) MG/3ML IN SOLN
3.0000 mL | Freq: Once | RESPIRATORY_TRACT | Status: AC
  Administered 2019-08-18: 3 mL via RESPIRATORY_TRACT
  Filled 2019-08-18: qty 3

## 2019-08-18 MED ORDER — METOPROLOL TARTRATE 25 MG PO TABS
25.0000 mg | ORAL_TABLET | Freq: Once | ORAL | Status: DC
  Filled 2019-08-18: qty 1

## 2019-08-18 MED ORDER — LORAZEPAM 0.5 MG PO TABS
0.5000 mg | ORAL_TABLET | Freq: Four times a day (QID) | ORAL | Status: DC | PRN
  Administered 2019-08-19 – 2019-08-25 (×7): 0.5 mg via ORAL
  Filled 2019-08-18 (×7): qty 1

## 2019-08-18 MED ORDER — BUDESONIDE 0.25 MG/2ML IN SUSP
0.5000 mg | Freq: Two times a day (BID) | RESPIRATORY_TRACT | Status: DC
  Administered 2019-08-18 – 2019-08-23 (×11): 0.5 mg via RESPIRATORY_TRACT
  Filled 2019-08-18 (×12): qty 4

## 2019-08-18 MED ORDER — ENOXAPARIN SODIUM 40 MG/0.4ML ~~LOC~~ SOLN
40.0000 mg | SUBCUTANEOUS | Status: DC
  Administered 2019-08-19 – 2019-08-25 (×7): 40 mg via SUBCUTANEOUS
  Filled 2019-08-18 (×7): qty 0.4

## 2019-08-18 MED ORDER — IPRATROPIUM BROMIDE 0.02 % IN SOLN
0.5000 mg | Freq: Four times a day (QID) | RESPIRATORY_TRACT | Status: DC
  Administered 2019-08-18 – 2019-08-25 (×29): 0.5 mg via RESPIRATORY_TRACT
  Filled 2019-08-18 (×29): qty 2.5

## 2019-08-18 MED ORDER — CHLORHEXIDINE GLUCONATE CLOTH 2 % EX PADS
6.0000 | MEDICATED_PAD | Freq: Every day | CUTANEOUS | Status: DC
  Administered 2019-08-18: 6 via TOPICAL

## 2019-08-18 MED ORDER — SODIUM CHLORIDE 0.9% FLUSH
3.0000 mL | Freq: Two times a day (BID) | INTRAVENOUS | Status: DC
  Administered 2019-08-18 – 2019-08-25 (×12): 3 mL via INTRAVENOUS

## 2019-08-18 MED ORDER — SODIUM CHLORIDE 0.9 % IV SOLN
500.0000 mg | INTRAVENOUS | Status: DC
  Administered 2019-08-19 – 2019-08-23 (×5): 500 mg via INTRAVENOUS
  Filled 2019-08-18 (×8): qty 500

## 2019-08-18 MED ORDER — PANTOPRAZOLE SODIUM 20 MG PO TBEC
20.0000 mg | DELAYED_RELEASE_TABLET | Freq: Every day | ORAL | Status: DC
  Administered 2019-08-19 – 2019-08-25 (×7): 20 mg via ORAL
  Filled 2019-08-18 (×8): qty 1

## 2019-08-18 MED ORDER — HYDROCOD POLST-CPM POLST ER 10-8 MG/5ML PO SUER
5.0000 mL | Freq: Two times a day (BID) | ORAL | Status: DC
  Administered 2019-08-18 – 2019-08-19 (×3): 5 mL via ORAL
  Filled 2019-08-18 (×3): qty 5

## 2019-08-18 MED ORDER — ASPIRIN EC 81 MG PO TBEC
81.0000 mg | DELAYED_RELEASE_TABLET | Freq: Every day | ORAL | Status: DC
  Administered 2019-08-19 – 2019-08-25 (×7): 81 mg via ORAL
  Filled 2019-08-18 (×8): qty 1

## 2019-08-18 MED ORDER — VITAMIN D 25 MCG (1000 UNIT) PO TABS
1000.0000 [IU] | ORAL_TABLET | Freq: Every day | ORAL | Status: DC
  Administered 2019-08-18 – 2019-08-24 (×7): 1000 [IU] via ORAL
  Filled 2019-08-18 (×7): qty 1

## 2019-08-18 MED ORDER — ACETAMINOPHEN 325 MG PO TABS: 650.0000 mg | ORAL_TABLET | ORAL | Status: DC | PRN

## 2019-08-18 MED ORDER — METHYLPREDNISOLONE SODIUM SUCC 40 MG IJ SOLR
40.0000 mg | Freq: Three times a day (TID) | INTRAMUSCULAR | Status: DC
  Administered 2019-08-18 – 2019-08-19 (×4): 40 mg via INTRAVENOUS
  Filled 2019-08-18 (×4): qty 1

## 2019-08-18 MED ORDER — IPRATROPIUM-ALBUTEROL 0.5-2.5 (3) MG/3ML IN SOLN
3.0000 mL | Freq: Once | RESPIRATORY_TRACT | Status: DC
  Filled 2019-08-18: qty 3

## 2019-08-18 MED ORDER — BIOTIN 2500 MCG PO CAPS: 5000.0000 ug/d | ORAL_CAPSULE | Freq: Every day | ORAL | Status: DC

## 2019-08-18 MED ORDER — FUROSEMIDE 10 MG/ML IJ SOLN
40.0000 mg | Freq: Once | INTRAMUSCULAR | Status: AC
  Administered 2019-08-18: 40 mg via INTRAVENOUS
  Filled 2019-08-18: qty 4

## 2019-08-18 MED ORDER — ADULT MULTIVITAMIN W/MINERALS CH
1.0000 | ORAL_TABLET | Freq: Every day | ORAL | Status: DC
  Administered 2019-08-18 – 2019-08-24 (×7): 1 via ORAL
  Filled 2019-08-18 (×7): qty 1

## 2019-08-18 MED ORDER — METHYLPREDNISOLONE SODIUM SUCC 125 MG IJ SOLR
125.0000 mg | Freq: Once | INTRAMUSCULAR | Status: AC
  Administered 2019-08-18: 125 mg via INTRAVENOUS
  Filled 2019-08-18: qty 2

## 2019-08-18 MED ORDER — FUROSEMIDE 10 MG/ML IJ SOLN: 40.0000 mg | Freq: Two times a day (BID) | INTRAMUSCULAR | Status: DC

## 2019-08-18 MED ORDER — OMEGA-3-ACID ETHYL ESTERS 1 G PO CAPS
1.0000 g | ORAL_CAPSULE | Freq: Every day | ORAL | Status: DC
  Administered 2019-08-19 – 2019-08-25 (×7): 1 g via ORAL
  Filled 2019-08-18 (×7): qty 1

## 2019-08-18 MED ORDER — IPRATROPIUM-ALBUTEROL 0.5-2.5 (3) MG/3ML IN SOLN
3.0000 mL | Freq: Four times a day (QID) | RESPIRATORY_TRACT | Status: DC
  Administered 2019-08-18: 3 mL via RESPIRATORY_TRACT
  Filled 2019-08-18: qty 3

## 2019-08-18 MED ORDER — GUAIFENESIN-DM 100-10 MG/5ML PO SYRP
15.0000 mL | ORAL_SOLUTION | ORAL | Status: DC | PRN
  Administered 2019-08-25: 15 mL via ORAL
  Filled 2019-08-18 (×3): qty 15

## 2019-08-18 MED ORDER — METOPROLOL TARTRATE 25 MG PO TABS
25.0000 mg | ORAL_TABLET | Freq: Two times a day (BID) | ORAL | Status: DC
  Administered 2019-08-18 – 2019-08-19 (×2): 25 mg via ORAL
  Filled 2019-08-18 (×2): qty 1

## 2019-08-18 MED ORDER — MORPHINE SULFATE 2 MG/ML IJ SOLN
1.0000 mg | INTRAMUSCULAR | Status: DC | PRN
  Administered 2019-08-22: 1 mg via INTRAVENOUS
  Administered 2019-08-23: 2 mg via INTRAVENOUS
  Administered 2019-08-24 (×3): 1 mg via INTRAVENOUS
  Administered 2019-08-25 (×3): 2 mg via INTRAVENOUS
  Filled 2019-08-18 (×8): qty 1

## 2019-08-18 NOTE — ED Notes (Signed)
Lab contacted to draw blood cultures

## 2019-08-18 NOTE — ED Notes (Signed)
Pt trialed of NRB and became tachypneic on 4L [place] and O2 dropped to 90%, Dr. Florene Glen informed and is changing pt's bed assignment

## 2019-08-18 NOTE — Progress Notes (Addendum)
PROGRESS NOTE    Ashley Molina  IRS:[id number] DOB: 02/06/1961 DOA: 08/17/2019 PCP: Patient, No Pcp Per   Brief Narrative:  Ashley Molina 59 y.o. Caucasian female with Ashley Molina known history of breast cancer with lung metastasis status post lumpectomy, chemotherapy and radiotherapy, COVID-19 in December 2020, dyslipidemia and anemia, who presented to [facility] worsening dyspnea with associated cough with inability to expectorate as well as wheezing today.  The patient was just discharged from here on 3/10 after being treated for acute on chronic respiratory failure requiring BiPAP.  No nausea or vomiting or abdominal pain.  She denies any chest pain or palpitations.  No fever or chills.  No dysuria, oliguria or hematuria or flank pain.  Upon presentation to the emergency room, heart rate was 111 has gone up to 142 with otherwise normal vital signs.  EKG showed atrial fibrillation/flutter with [name] rate of 165.  Respiratory clear unless 25-28.  Labs revealed potassium of 3.6 magnesium 2.2 anion gap of 15.  BNP was 400.  High-sensitivity troponin I was 38 and later 98 and CBC showed leukocytosis of 13.9.  Influenza antigens and COVID-19 PCR came back negative.  Chest x-ray showed findings representing pulmonary edema with pneumonia not excluded.  The patient was given 40 mg of IV Lasix and 2 duo nebs as well as 125 mg of IV Solu-Medrol.  She will be admitted to an observation medically monitored bed for further evaluation and management.  Assessment & Plan:   Active Problems:   COPD exacerbation ([place])  1.  Acute Hypoxic Respiratory Failure  COPD acute exacerbation with subsequent acute cor pulmonale with mild interstitial pulmonary edema. - CXR 3/15 with edema - Steroids, scheduled and prn nebs, pulmicort - IV lasix  -She will be placed on scheduled and as needed duo nebs. -We will continue IV steroid therapy with salmeterol. 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  Hx of covid and radiation induced pneumonitis as well.  Multiple possible contributing factors.  Will c/s pulm to follow.  2. Sinus Tachycardia with PAC vs Fib/Flutter  Elevated Troponin - some question of possible afib/flutter, though suspect this is sinus tach with PAC's - will consult cardiology with elevated troponin and tachyarrythmia, suspect this is demand ischemia - recent echo with normal EF -continue metop  3.  GERD. -PPI therapy will be resumed.  4.  Hypertension. -Beta-blocker therapy will be resumed.  5.  DVT prophylaxis. -Subcutaneous Lovenox  DVT prophylaxis: lovenox Code Status: full Family Communication: none at bedside Disposition Plan:  . Patient came from: home            . Anticipated d/c place: home . Barriers to d/c OR conditions which need to be met to effect [place] safe d/c: pending improvement in resp status  Consultants:   oncology  Procedures:   none  Antimicrobials:  Anti-infectives (From admission, onward)   Start     Dose/Rate Route Frequency Ordered Stop   08/18/19 0900  sulfamethoxazole-trimethoprim (BACTRIM DS) 800-160 MG per tablet 1 tablet     1 tablet Oral Once per day on Mon Wed Fri 08/18/19 0622     08/18/19 0700  cefTRIAXone (ROCEPHIN) 2 g in sodium chloride 0.9 % 100 mL IVPB     2 g 200 mL/hr over 30 Minutes Intravenous Every 24 hours 08/18/19 0647  08/18/19 0700  azithromycin (ZITHROMAX) 500 mg in sodium chloride 0.9 % 250 mL IVPB     500 mg 250 mL/hr over 60 Minutes Intravenous Every 24 hours 08/18/19 0647       Subjective: C/o SOB, worsened Sunday night after neb  Objective: Vitals:   08/18/19 1053 08/18/19 1100 08/18/19 1147 08/18/19 1430  BP:  111/62    Pulse:  98  97  Resp:  (!) 23  (!) 24  Temp: 97.9 F (36.6 C)     TempSrc: Axillary     SpO2:  99%  96% 95%  Weight: 86 kg     Height: 5' 3"  (1.6 m)      No intake or output data in the 24 hours ending 08/18/19 1630 Filed Weights   08/17/19 2352 08/18/19 1053  Weight: 90 kg 86 kg    Examination:  General exam: Appears calm and comfortable  Respiratory system: scattered wheezing, coarse breath sounds Cardiovascular system: S1 & S2 heard, RRR Gastrointestinal system: Abdomen is nondistended, soft and nontender. Central nervous system: Alert and oriented. No focal neurological deficits. Extremities: no LEE Skin: No rashes, lesions or ulcers Psychiatry: Judgement and insight appear normal. Mood & affect appropriate.     Data Reviewed: I have personally reviewed following labs and imaging studies  CBC: Recent Labs  Lab 08/13/19 1502 08/14/19 0517 08/15/19 0639 08/16/19 0601 08/18/19 0004  WBC 17.1* 10.4 10.0 10.4 13.9*  NEUTROABS 15.7*  --   --   --   --   HGB 12.9 12.6 11.7* 11.6* 13.5  HCT 41.4 39.2 38.0 36.5 44.2  MCV 92.2 90.3 92.0 91.5 94.2  PLT 307 256 201 191 295   Basic Metabolic Panel: Recent Labs  Lab 08/13/19 1502 08/14/19 0517 08/15/19 0639 08/16/19 0601 08/18/19 0045  NA 138 140 136 136 139  K 4.3 4.0 3.6 4.0 3.6  CL 105 103 99 97* 96*  CO2 20* 25 27 30 28   GLUCOSE 152* 130* 129* 129* 131*  BUN 26* 20 32* 25* 30*  CREATININE 0.71 0.78 0.89 0.74 0.74  CALCIUM 9.6 9.7 9.5 9.3 9.9  MG  --   --  2.1 2.2 2.2  PHOS  --   --   --  3.4  --    GFR: Estimated Creatinine Clearance: 77.7 mL/min (by C-G formula based on SCr of 0.74 mg/dL). Liver Function Tests: Recent Labs  Lab 08/13/19 1502 08/18/19 0045  AST 30 26  ALT 39 26  ALKPHOS 111 105  BILITOT 0.6 0.9  PROT 6.9 6.9  ALBUMIN 3.9 4.0   No results for input(s): LIPASE, AMYLASE in the last 168 hours. No results for input(s): AMMONIA in the last 168 hours. Coagulation Profile: No results for input(s): INR, PROTIME in the last 168 hours. Cardiac Enzymes: No results for input(s): CKTOTAL,  CKMB, CKMBINDEX, TROPONINI in the last 168 hours. BNP (last 3 results) No results for input(s): PROBNP in the last 8760 hours. HbA1C: No results for input(s): HGBA1C in the last 72 hours. CBG: Recent Labs  Lab 08/13/19 2035 08/18/19 1039  GLUCAP 137* 128*   Lipid Profile: No results for input(s): CHOL, HDL, LDLCALC, TRIG, CHOLHDL, LDLDIRECT in the last 72 hours. Thyroid Function Tests: No results for input(s): TSH, T4TOTAL, FREET4, T3FREE, THYROIDAB in the last 72 hours. Anemia Panel: No results for input(s): VITAMINB12, FOLATE, FERRITIN, TIBC, IRON, RETICCTPCT in the last 72 hours. Sepsis Labs: Recent Labs  Lab 08/13/19 1502 08/13/19 2112 08/14/19 0739 08/14/19 1884  PROCALCITON  --  <0.10  --   --   LATICACIDVEN 2.1* 2.0* 1.4 1.5    Recent Results (from the past 240 hour(s))  Blood Culture (routine x 2)     Status: None   Collection Time: 08/13/19  3:02 PM   Specimen: BLOOD  Result Value Ref Range Status   Specimen Description BLOOD BLOOD LEFT HAND  Final   Special Requests   Final    BOTTLES DRAWN AEROBIC AND ANAEROBIC Blood Culture adequate volume   Culture   Final    NO GROWTH 5 DAYS Performed at [facility], [address], [place], [place] [address]    Report Status 08/18/2019 FINAL  Final  Blood Culture (routine x 2)     Status: None   Collection Time: 08/13/19  3:07 PM   Specimen: BLOOD  Result Value Ref Range Status   Specimen Description BLOOD LEFT ANTECUBITAL  Final   Special Requests   Final    BOTTLES DRAWN AEROBIC AND ANAEROBIC Blood Culture adequate volume   Culture   Final    NO GROWTH 5 DAYS Performed at [facility], [address] [place], [place] [address]    Report Status 08/18/2019 FINAL  Final  SARS CORONAVIRUS 2 (TAT 6-24 HRS) Nasopharyngeal Nasopharyngeal Swab     Status: None   Collection Time: 08/13/19  8:16 PM   Specimen: Nasopharyngeal Swab  Result Value Ref Range Status   SARS Coronavirus 2 NEGATIVE  NEGATIVE Final    Comment: (NOTE) SARS-CoV-2 target nucleic acids are NOT DETECTED. The SARS-CoV-2 RNA is generally detectable in upper and lower respiratory specimens during the acute phase of infection. Negative results do not preclude SARS-CoV-2 infection, do not rule out co-infections with other pathogens, and should not be used as the sole basis for treatment or other patient management decisions. Negative results must be combined with clinical observations, patient history, and epidemiological information. The expected result is Negative. Fact Sheet for Patients: SugarRoll.be Fact Sheet for Healthcare Providers: https://www.woods-mathews.com/ This test is not yet approved or cleared by the [place] FDA and  has been authorized for detection and/or diagnosis of SARS-CoV-2 by FDA under an Emergency Use Authorization (EUA). This EUA will remain  in effect (meaning this test can be used) for the duration of the COVID-19 declaration under Section 56 4(b)(1) of the Act, 21 U.S.C. section 360bbb-3(b)(1), unless the authorization is terminated or revoked sooner. Performed at [facility], [place] [address], [place], [place] [address]   MRSA PCR Screening     Status: None   Collection Time: 08/13/19 11:28 PM   Specimen: Nasopharyngeal  Result Value Ref Range Status   MRSA by PCR NEGATIVE NEGATIVE Final    Comment:        The GeneXpert MRSA Assay (FDA approved for NASAL specimens only), is one component of Eissa Buchberger comprehensive MRSA colonization surveillance program. It is not intended to diagnose MRSA infection nor to guide or monitor treatment for MRSA infections. Performed at [facility], [address] [place], [place] [address]   Respiratory Panel by RT PCR (Flu [place], Covid) - Nasopharyngeal Swab     Status: None   Collection Time: 08/18/19  2:23 AM   Specimen: Nasopharyngeal Swab  Result Value Ref Range Status   SARS  Coronavirus 2 by RT PCR NEGATIVE NEGATIVE Final    Comment: (NOTE) SARS-CoV-2 target nucleic acids are NOT DETECTED. The SARS-CoV-2 RNA is generally detectable in upper respiratoy specimens during the  acute phase of infection. The lowest concentration of SARS-CoV-2 viral copies this assay can detect is 131 copies/mL. Geriann Lafont negative result does not preclude SARS-Cov-2 infection and should not be used as the sole basis for treatment or other patient management decisions. Mandela Bello negative result may occur with  improper specimen collection/handling, submission of specimen other than nasopharyngeal swab, presence of viral mutation(s) within the areas targeted by this assay, and inadequate number of viral copies (<131 copies/mL). Ryin Schillo negative result must be combined with clinical observations, patient history, and epidemiological information. The expected result is Negative. Fact Sheet for Patients:  PinkCheek.be Fact Sheet for Healthcare Providers:  GravelBags.it This test is not yet ap proved or cleared by the [place] FDA and  has been authorized for detection and/or diagnosis of SARS-CoV-2 by FDA under an Emergency Use Authorization (EUA). This EUA will remain  in effect (meaning this test can be used) for the duration of the COVID-19 declaration under Section 564(b)(1) of the Act, 21 U.S.C. section 360bbb-3(b)(1), unless the authorization is terminated or revoked sooner.    Influenza Joseandres Mazer by PCR NEGATIVE NEGATIVE Final   Influenza B by PCR NEGATIVE NEGATIVE Final    Comment: (NOTE) The Xpert Xpress SARS-CoV-2/FLU/RSV assay is intended as an aid in  the diagnosis of influenza from Nasopharyngeal swab specimens and  should not be used as Assata Juncaj sole basis for treatment. Nasal washings and  aspirates are unacceptable for Xpert Xpress SARS-CoV-2/FLU/RSV  testing. Fact Sheet for Patients: PinkCheek.be Fact Sheet  for Healthcare Providers: GravelBags.it This test is not yet approved or cleared by the [place] FDA and  has been authorized for detection and/or diagnosis of SARS-CoV-2 by  FDA under an Emergency Use Authorization (EUA). This EUA will remain  in effect (meaning this test can be used) for the duration of the  Covid-19 declaration under Section 564(b)(1) of the Act, 21  U.S.C. section 360bbb-3(b)(1), unless the authorization is  terminated or revoked. Performed at [facility], [address] [place], [place] [address]          Radiology Studies: DG Chest 1 View  Result Date: 08/18/2019 CLINICAL DATA:  59 year old female with shortness of breath. EXAM: CHEST  1 VIEW COMPARISON:  Chest radiograph dated 08/14/2019 and CT dated 08/06/2019 FINDINGS: Right hilar post treatment changes. There is diffuse interstitial prominence and lower lung field hazy densities likely representing edema. Pneumonia is not excluded. Clinical correlation is recommended. There is no pneumothorax. There is Aivan Fillingim small right pleural effusion. Stable cardiac silhouette. Left pectoral Port-Kelso Bibby-Cath with tip close to the cavoatrial junction. Atherosclerotic calcification of the aorta. No acute osseous pathology. IMPRESSION: Findings likely representing edema. Pneumonia is not excluded. Electronically Signed   By: Anner Crete M.D.   On: 08/18/2019 00:50        Scheduled Meds: . aspirin EC  81 mg Oral Daily  . budesonide  0.5 mg Nebulization BID  . Chlorhexidine Gluconate Cloth  6 each Topical Daily  . chlorpheniramine-HYDROcodone  5 mL Oral Q12H  . cholecalciferol  1,000 Units Oral QHS  . enoxaparin (LOVENOX) injection  40 mg Subcutaneous Q24H  . ferrous sulfate  325 mg Oral QHS  . furosemide  40 mg Intravenous Q12H  . ipratropium  0.5 mg Nebulization Q6H  . ipratropium-albuterol  3 mL Nebulization Once  . levalbuterol  0.63 mg Nebulization Q6H  . methylPREDNISolone  (SOLU-MEDROL) injection  40 mg Intravenous Q8H  . metoprolol tartrate  25 mg Oral Once  . metoprolol tartrate  25 mg Oral BID  . multivitamin with minerals  1 tablet Oral QHS  . omega-3 acid ethyl esters  1 g Oral Daily  . pantoprazole  20 mg Oral Daily  . sodium chloride flush  3 mL Intravenous Q12H  . sulfamethoxazole-trimethoprim  1 tablet Oral Once per day on Mon Wed Fri   Continuous Infusions: . sodium chloride    . azithromycin    . cefTRIAXone (ROCEPHIN)  IV Stopped (08/18/19 1159)     LOS: 0 days    Time spent: over 30 min    Fayrene Helper, MD [facility]   To contact the attending provider between 7A-7P or the covering provider during after hours 7P-7A, please log into the web site [email or web address] and access using universal [place] password for that web site. If you do not have the password, please call the hospital operator.  08/18/2019, 4:30 PM

## 2019-08-18 NOTE — H&P (Addendum)
[place] [facility] [place] NAME: Ashley Molina    MR#:  [id number]  DATE OF BIRTH:  05-14-1961  DATE OF ADMISSION:  08/17/2019  PRIMARY CARE PHYSICIAN: Patient, No Pcp Per   REQUESTING/REFERRING PHYSICIAN: Blake Divine, MD  CHIEF COMPLAINT:   Chief Complaint  Patient presents with  . Respiratory Distress    HISTORY OF PRESENT ILLNESS:  Ashley Molina  is a 59 y.o. Caucasian female with a known history of breast cancer with lung metastasis status post lumpectomy, chemotherapy and radiotherapy, COVID-19 in December 2020, dyslipidemia and anemia, who presented to [facility] worsening dyspnea with associated cough with inability to expectorate as well as wheezing today.  The patient was just discharged from here on 3/10 after being treated for acute on chronic respiratory failure requiring BiPAP.  No nausea or vomiting or abdominal pain.  She denies any chest pain or palpitations.  No fever or chills.  No dysuria, oliguria or hematuria or flank pain.  Upon presentation to the emergency room, heart rate was 111 has gone up to 142 with otherwise normal vital signs.  EKG showed atrial fibrillation/flutter with a rate of 165.  Respiratory clear unless 25-28.  Labs revealed potassium of 3.6 magnesium 2.2 anion gap of 15.  BNP was 400.  High-sensitivity troponin I was 38 and later 98 and CBC showed leukocytosis of 13.9.  Influenza antigens and COVID-19 PCR came back negative.  Chest x-ray showed findings representing pulmonary edema with pneumonia not excluded.  The patient was given 40 mg of IV Lasix and 2 duo nebs as well as 125 mg of IV Solu-Medrol.  She will be admitted to an observation medically monitored bed for further evaluation and management. PAST MEDICAL HISTORY:   Past Medical History:  Diagnosis Date  . Anemia   . Breast cancer ([place])   . Breast cancer, right ([place]) 04/2017   Hx Lumpectomy, Chemo + Rad tx's.  . Breast cyst, right    aspirated by Dr. Bary Castilla  . COVID-19   . Hyperlipidemia   . Personal history of chemotherapy   . Pre-diabetes     PAST SURGICAL HISTORY:   Past Surgical History:  Procedure Laterality Date  . AXILLARY LYMPH NODE BIOPSY Right 04/09/2017   Procedure: AXILLARY LYMPH NODE BIOPSY;  Surgeon: Robert Bellow, MD;  Location: [facility] ORS;  Service: General;  Laterality: Right;  . BREAST BIOPSY Right 03/27/2017   US guided breast mass - invasive mammary carcinoma  . BREAST BIOPSY Right 03/27/2017   Lymph node - metastatic carcinoma  . BREAST BIOPSY Right 04/09/2017   Lymph node   . BREAST CYST ASPIRATION Right 11/2009   Dr. Bary Castilla did FNA  . BREAST EXCISIONAL BIOPSY Right 09/28/2017   lumpectomy and 12 lympnode rad neo adj chemo  . COLONOSCOPY  2011  . DILATION AND CURETTAGE OF UTERUS     X3  . ENDOBRONCHIAL ULTRASOUND N/A 04/09/2017   Procedure: ENDOBRONCHIAL ULTRASOUND;  Surgeon: Laverle Hobby, MD;  Location: [facility] ORS;  Service: Pulmonary;  Laterality: N/A;  . ENDOMETRIAL ABLATION    . ENDOMETRIAL BIOPSY  09/2009  . PORTACATH PLACEMENT Left 04/09/2017   Procedure: INSERTION PORT-A-CATH;  Surgeon: Robert Bellow, MD;  Location: [facility] ORS;  Service: General;  Laterality: Left;    SOCIAL HISTORY:   Social History   Tobacco Use  . Smoking status: Never Smoker  . Smokeless tobacco: Never Used  Substance Use Topics  . Alcohol use: Not Currently  FAMILY HISTORY:   Family History  Problem Relation Age of Onset  . Melanoma Maternal Grandmother 53       currently 11  . Brain cancer Maternal Grandfather 61       unk. type; deceased in 53s  . Melanoma Other 15       mat grandmother's father  . Melanoma Father 51       on head; currently 67  . Prostate cancer Maternal Uncle        3 maternal uncles; dx in 22s  . Breast cancer Other        mat grandfather's sister; dx 3s    DRUG ALLERGIES:   Allergies  Allergen Reactions  . Penicillins Rash    REVIEW OF  SYSTEMS:   ROS As per history of present illness. All pertinent systems were reviewed above. Constitutional,  HEENT, cardiovascular, respiratory, GI, GU, musculoskeletal, neuro, psychiatric, endocrine,  integumentary and hematologic systems were reviewed and are otherwise  negative/unremarkable except for positive findings mentioned above in the HPI.   MEDICATIONS AT HOME:   Prior to Admission medications   Medication Sig Start Date End Date Taking? Authorizing Provider  Biotin 2500 MCG CAPS Take 5,000 mcg/day by mouth at bedtime.     [provider]  budesonide (PULMICORT) 0.5 MG/2ML nebulizer solution Take 2 mLs (0.5 mg total) by nebulization 2 (two) times daily. 08/16/19   Lorella Nimrod, MD  chlorpheniramine-HYDROcodone (TUSSIONEX) 10-8 MG/5ML SUER Take 5 mLs by mouth every 12 (twelve) hours. 08/16/19   Lorella Nimrod, MD  Cholecalciferol (VITAMIN D3) 10000 units TABS Take 1 tablet by mouth at bedtime.     [provider]  Ferrous Sulfate (IRON) 325 (65 Fe) MG TABS Take 1 tablet by mouth at bedtime.     [provider]  Fluticasone Furoate (ARNUITY ELLIPTA) 100 MCG/ACT AEPB Inhale 1 puff into the lungs daily. 06/11/19   Tyler Pita, MD  furosemide (LASIX) 20 MG tablet Take 1 tablet (20 mg total) by mouth daily. Diuretic, water pill. 08/16/19 09/15/19  Lorella Nimrod, MD  guaiFENesin-dextromethorphan (ROBITUSSIN DM) 100-10 MG/5ML syrup Take 15 mLs by mouth every 4 (four) hours as needed for cough. 08/16/19   Lorella Nimrod, MD  ipratropium-albuterol (DUONEB) 0.5-2.5 (3) MG/3ML SOLN Take 3 mLs by nebulization every 6 (six) hours. 08/16/19   Lorella Nimrod, MD  metoprolol tartrate (LOPRESSOR) 25 MG tablet Take 1 tablet (25 mg total) by mouth 2 (two) times daily. 08/16/19   Lorella Nimrod, MD  Multiple Vitamins-Minerals (CENTRUM SILVER ADULT 50+) TABS Take 1 tablet by mouth at bedtime.     [provider]  Omega-3 Fatty Acids (FISH OIL) 1000 MG CAPS Take 1 capsule  by mouth at bedtime.     [provider]  pantoprazole (PROTONIX) 20 MG tablet Take 1 tablet (20 mg total) by mouth daily. 06/20/19   Sindy Guadeloupe, MD  predniSONE (DELTASONE) 20 MG tablet Take 2 tablets (40 mg total) by mouth daily with breakfast. 08/11/19   Sindy Guadeloupe, MD  sulfamethoxazole-trimethoprim (BACTRIM DS) 800-160 MG tablet Take 1 tablet by mouth 3 (three) times a week. 08/18/19   Lorella Nimrod, MD  prochlorperazine (COMPAZINE) 10 MG tablet Take 1 tablet (10 mg total) by mouth every 6 (six) hours as needed (Nausea or vomiting). 04/05/17 09/05/18  Sindy Guadeloupe, MD      VITAL SIGNS:  Blood pressure 99/78, pulse 94, temperature 98.5 F (36.9 C), temperature source Oral, resp. rate 19, height 5\' 3"  (  1.6 m), weight 90 kg, last menstrual period 04/04/2012, SpO2 93 %.  PHYSICAL EXAMINATION:  Physical Exam  GENERAL:  59 y.o.-year-old Caucasian female patient lying in the bed with no acute distress.  EYES: Pupils equal, round, reactive to light and accommodation. No scleral icterus. Extraocular muscles intact.  HEENT: Head atraumatic, normocephalic. Oropharynx and nasopharynx clear.  NECK:  Supple, no jugular venous distention. No thyroid enlargement, no tenderness.  LUNGS: Slight diminished bibasal breath sounds with bibasal rales.  She had diffuse expiratory wheezes with tight expiratory airflow and harsh vesicular breathing. CARDIOVASCULAR: Regular rate and rhythm, S1, S2 normal. No murmurs, rubs, or gallops.  ABDOMEN: Soft, nondistended, nontender. Bowel sounds present. No organomegaly or mass.  EXTREMITIES: No pedal edema, cyanosis, or clubbing.  NEUROLOGIC: Cranial nerves II through XII are intact. Muscle strength 5/5 in all extremities. Sensation intact. Gait not checked.  PSYCHIATRIC: The patient is alert and oriented x 3.  Normal affect and good eye contact. SKIN: No obvious rash, lesion, or ulcer.   LABORATORY PANEL:   CBC Recent Labs  Lab 08/18/19 0004  WBC  13.9*  HGB 13.5  HCT 44.2  PLT 181   ------------------------------------------------------------------------------------------------------------------  Chemistries  Recent Labs  Lab 08/18/19 0045  NA 139  K 3.6  CL 96*  CO2 28  GLUCOSE 131*  BUN 30*  CREATININE 0.74  CALCIUM 9.9  MG 2.2  AST 26  ALT 26  ALKPHOS 105  BILITOT 0.9   ------------------------------------------------------------------------------------------------------------------  Cardiac Enzymes No results for input(s): TROPONINI in the last 168 hours. ------------------------------------------------------------------------------------------------------------------  RADIOLOGY:  DG Chest 1 View  Result Date: 08/18/2019 CLINICAL DATA:  59 year old female with shortness of breath. EXAM: CHEST  1 VIEW COMPARISON:  Chest radiograph dated 08/14/2019 and CT dated 08/06/2019 FINDINGS: Right hilar post treatment changes. There is diffuse interstitial prominence and lower lung field hazy densities likely representing edema. Pneumonia is not excluded. Clinical correlation is recommended. There is no pneumothorax. There is a small right pleural effusion. Stable cardiac silhouette. Left pectoral Port-A-Cath with tip close to the cavoatrial junction. Atherosclerotic calcification of the aorta. No acute osseous pathology. IMPRESSION: Findings likely representing edema. Pneumonia is not excluded. Electronically Signed   By: Anner Crete M.D.   On: 08/18/2019 00:50      IMPRESSION AND PLAN:   1.  COPD acute exacerbation with subsequent acute cor pulmonale with mild interstitial pulmonary edema. -The patient will be admitted to an observation medically monitored bed. -She will be placed on scheduled and as needed duo nebs. -We will continue IV steroid therapy with salmeterol. -We will continue her Pulmicort nebulizer and antitussives. -The patient will be diuresed with IV Lasix. -Patient just had a 2D echo that  revealed EF of 55 to 60% with normal left ventricular diastolic parameters.  -Leukocytosis likely secondary to to steroid therapy but given abnormal chest x-ray finding and inability to exclude pneumonia with current COPD exacerbation, will add IV Rocephin and Zithromax.  2..  Proximal atrial flutter/atrial fibrillation with rapid ventricular response.   -This is remarkably improved and converted to normal sinus rhythm with above management. -We will optimize potassium.  Magnesium level came back normal. -We will continue Lopressor.  3.  GERD. -PPI therapy will be resumed.  4.  Hypertension. -Beta-blocker therapy will be resumed.  5.  DVT prophylaxis. -Subcutaneous Lovenox    All the records are reviewed and case discussed with ED provider. The plan of care was discussed in details with the patient (and family). I answered  all questions. The patient agreed to proceed with the above mentioned plan. Further management will depend upon hospital course.   CODE STATUS: Full code  TOTAL TIME TAKING CARE OF THIS PATIENT: 55 minutes.    Christel Mormon M.D on 08/18/2019 at 6:23 AM  [facility]   From 7 PM-7 AM, contact night-coverage [email or web address]  CC: Primary care physician; Patient, No Pcp Per   Note: This dictation was prepared with Dragon dictation along with smaller phrase technology. Any transcriptional errors that result from this process are unintentional.

## 2019-08-18 NOTE — Consult Note (Signed)
Hematology/Oncology Consult note [facility] Telephone:([phone] [phone]  Patient Care Team: Patient, No Pcp Per as PCP - General (General Practice) Byrnett, Forest Gleason, MD (General Surgery) Gae Dry, MD as Referring Physician (Obstetrics and Gynecology)   Name of the patient: Ashley Molina  [id number]  1960-08-02    Reason for consult: Acute on chronic hypoxic respiratory failure   Requesting physician Dr. 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  She has gone through multiple lines of chemotherapy and the most recent chemo was gemcitabine which she received on 07/17/2019.  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  Patient also recovered from Covid infection in December 2020.  Patient has been on chronic steroids 5 to 10 mg as she was having flareups of acute hypoxia in the last 3 to 4 months.  She also sees Dr. Patsey Berthold from pulmonary.  She has not had a bronchoscopy recently given her recent Covid as well as oxygen dependence.  She was seen by me on 08/11/2019 and at that time her gemcitabine chemotherapy was held and given her increasing oxygen requirements I had increased her steroids to 40 mg of prednisone after speaking to Dr. Patsey Berthold.  Prior to that she was admitted to the hospital for worsening shortness of breath which improved with diuresis.  After her visit with me on 08/11/2019 she came to the ER again with worsening shortness of breathEchocardiogram showed a normal EF of 55 to 60%.  She was treated with Lasix and steroids and discharged and now she is readmitted with worsening shortness of breath.  This is her third admission in the last 1 month.  ECOG PS- 2  Pain scale- 0   Review  of systems- Review of Systems  Constitutional: Negative for chills, fever, malaise/fatigue and weight loss.  HENT: Negative for congestion, ear discharge and nosebleeds.   Eyes: Negative for blurred vision.  Respiratory: Positive for shortness of breath. Negative for cough, hemoptysis, sputum production and wheezing.   Cardiovascular: Negative for chest pain, palpitations, orthopnea and claudication.  Gastrointestinal: Negative for abdominal pain, blood in stool, constipation, diarrhea, heartburn, melena, nausea and vomiting.  Genitourinary: Negative for dysuria, flank pain, frequency, hematuria and urgency.  Musculoskeletal: Negative for back pain, joint pain and myalgias.  Skin: Negative for rash.  Neurological: Negative for dizziness, tingling, focal weakness, seizures, weakness and headaches.  Endo/Heme/Allergies: Does not bruise/bleed easily.  Psychiatric/Behavioral: Negative for depression and suicidal ideas. The patient does not have insomnia.     Allergies  Allergen Reactions  . Penicillins Rash    Did it involve swelling of the face/tongue/throat, SOB, or low BP? No Did it involve sudden or severe rash/hives, skin peeling, or any reaction on the inside of your mouth or nose? Yes Did you need to seek medical attention at a hospital or doctor's office? No When did it last happen? If all above answers are "NO", may proceed with cephalosporin use.    Patient Active Problem List   Diagnosis Date Noted  . COPD exacerbation ([place]) 08/18/2019  . Palliative care by specialist   . Respiratory failure ([place]) 08/13/2019  . Acute pulmonary edema (HCC)   . Elevated troponin   . COVID-19 08/06/2019  . Pneumonitis 01/20/2019  . Goals of  care, counseling/discussion 10/08/2018  . Chemotherapy induced neutropenia ([place]) 05/03/2017  . Carcinoma of right breast metastatic to lung ([place]) 04/15/2017  . Malignant neoplasm of upper-outer quadrant of right breast in female, estrogen receptor  negative ([place]) 04/05/2017  . Anemia 03/14/2017  . Solitary cyst of breast, right 03/14/2017  . Pre-diabetes 03/14/2017  . Hypercholesterolemia 03/14/2017  . Mass of upper outer quadrant of right breast 03/14/2017     Past Medical History:  Diagnosis Date  . Anemia   . Breast cancer ([place])   . Breast cancer, right ([place]) 04/2017   Hx Lumpectomy, Chemo + Rad tx's.  . Breast cyst, right    aspirated by Dr. Bary Castilla  . COVID-19   . Hyperlipidemia   . Personal history of chemotherapy   . Pre-diabetes      Past Surgical History:  Procedure Laterality Date  . AXILLARY LYMPH NODE BIOPSY Right 04/09/2017   Procedure: AXILLARY LYMPH NODE BIOPSY;  Surgeon: Robert Bellow, MD;  Location: [facility] ORS;  Service: General;  Laterality: Right;  . BREAST BIOPSY Right 03/27/2017   US guided breast mass - invasive mammary carcinoma  . BREAST BIOPSY Right 03/27/2017   Lymph node - metastatic carcinoma  . BREAST BIOPSY Right 04/09/2017   Lymph node   . BREAST CYST ASPIRATION Right 11/2009   Dr. Bary Castilla did FNA  . BREAST EXCISIONAL BIOPSY Right 09/28/2017   lumpectomy and 12 lympnode rad neo adj chemo  . COLONOSCOPY  2011  . DILATION AND CURETTAGE OF UTERUS     X3  . ENDOBRONCHIAL ULTRASOUND N/A 04/09/2017   Procedure: ENDOBRONCHIAL ULTRASOUND;  Surgeon: Laverle Hobby, MD;  Location: [facility] ORS;  Service: Pulmonary;  Laterality: N/A;  . ENDOMETRIAL ABLATION    . ENDOMETRIAL BIOPSY  09/2009  . PORTACATH PLACEMENT Left 04/09/2017   Procedure: INSERTION PORT-A-CATH;  Surgeon: Robert Bellow, MD;  Location: [facility] ORS;  Service: General;  Laterality: Left;    Social History   Socioeconomic History  . Marital status: Married    Spouse name: Not on file  . Number of children: Not on file  . Years of education: Not on file  . Highest education level: Not on file  Occupational History  . Not on file  Tobacco Use  . Smoking status: Never Smoker  . Smokeless tobacco: Never Used    Substance and Sexual Activity  . Alcohol use: Not Currently  . Drug use: No  . Sexual activity: Not Currently  Other Topics Concern  . Not on file  Social History Narrative  . Not on file   Social Determinants of Health   Financial Resource Strain:   . Difficulty of Paying Living Expenses:   Food Insecurity:   . Worried About Charity fundraiser in the Last Year:   . Arboriculturist in the Last Year:   Transportation Needs:   . Film/video editor (Medical):   Marland Kitchen Lack of Transportation (Non-Medical):   Physical Activity:   . Days of Exercise per Week:   . Minutes of Exercise per Session:   Stress:   . Feeling of Stress :   Social Connections:   . Frequency of Communication with Friends and Family:   . Frequency of Social Gatherings with Friends and Family:   . Attends Religious Services:   . Active Member of Clubs or Organizations:   . Attends Archivist Meetings:   Marland Kitchen Marital Status:   Intimate Partner Violence:   . Fear of Current  or Ex-Partner:   . Emotionally Abused:   Marland Kitchen Physically Abused:   . Sexually Abused:      Family History  Problem Relation Age of Onset  . Melanoma Maternal Grandmother 87       currently 75  . Brain cancer Maternal Grandfather 80       unk. type; deceased in 74s  . Melanoma Other 89       mat grandmother's father  . Melanoma Father 16       on head; currently 61  . Prostate cancer Maternal Uncle        3 maternal uncles; dx in 44s  . Breast cancer Other        mat grandfather's sister; dx 24s     Current Facility-Administered Medications:  .  0.9 %  sodium chloride infusion, 250 mL, Intravenous, PRN, Mansy, Jan A, MD .  acetaminophen (TYLENOL) tablet 650 mg, 650 mg, Oral, Q4H PRN, Mansy, Jan A, MD .  aspirin EC tablet 81 mg, 81 mg, Oral, Daily, Mansy, Jan A, MD .  azithromycin (ZITHROMAX) 500 mg in sodium chloride 0.9 % 250 mL IVPB, 500 mg, Intravenous, Q24H, Mansy, Jan A, MD .  budesonide (PULMICORT) nebulizer  solution 0.5 mg, 0.5 mg, Nebulization, BID, Mansy, Jan A, MD, 0.5 mg at 08/18/19 0923 .  cefTRIAXone (ROCEPHIN) 2 g in sodium chloride 0.9 % 100 mL IVPB, 2 g, Intravenous, Q24H, Mansy, Arvella Merles, MD, Stopped at 08/18/19 1159 .  Chlorhexidine Gluconate Cloth 2 % PADS 6 each, 6 each, Topical, Daily, Elodia Florence., MD, 6 each at 08/18/19 1401 .  chlorpheniramine-HYDROcodone (TUSSIONEX) 10-8 MG/5ML suspension 5 mL, 5 mL, Oral, Q12H, Mansy, Jan A, MD, 5 mL at 08/18/19 0924 .  cholecalciferol (VITAMIN D3) tablet 1,000 Units, 1,000 Units, Oral, QHS, Mansy, Jan A, MD .  enoxaparin (LOVENOX) injection 40 mg, 40 mg, Subcutaneous, Q24H, Mansy, Jan A, MD .  ferrous sulfate tablet 325 mg, 325 mg, Oral, QHS, Mansy, Jan A, MD .  furosemide (LASIX) injection 40 mg, 40 mg, Intravenous, Q12H, Mansy, Jan A, MD .  guaiFENesin-dextromethorphan (ROBITUSSIN DM) 100-10 MG/5ML syrup 15 mL, 15 mL, Oral, Q4H PRN, Mansy, Jan A, MD .  ipratropium (ATROVENT) nebulizer solution 0.5 mg, 0.5 mg, Nebulization, Q6H, Elodia Florence., MD, 0.5 mg at 08/18/19 1427 .  ipratropium-albuterol (DUONEB) 0.5-2.5 (3) MG/3ML nebulizer solution 3 mL, 3 mL, Nebulization, Once, Blake Divine, MD .  levalbuterol [facility]) nebulizer solution 0.63 mg, 0.63 mg, Nebulization, Q6H, Elodia Florence., MD, 0.63 mg at 08/18/19 1427 .  methylPREDNISolone sodium succinate (SOLU-MEDROL) 40 mg/mL injection 40 mg, 40 mg, Intravenous, Q8H, Mansy, Jan A, MD, 40 mg at 08/18/19 1400 .  metoprolol tartrate (LOPRESSOR) tablet 25 mg, 25 mg, Oral, Once, Blake Divine, MD, Stopped at 08/18/19 0424 .  metoprolol tartrate (LOPRESSOR) tablet 25 mg, 25 mg, Oral, BID, Mansy, Jan A, MD, Stopped at 08/18/19 [phone] .  multivitamin with minerals tablet 1 tablet, 1 tablet, Oral, QHS, Mansy, Jan A, MD .  omega-3 acid ethyl esters (LOVAZA) capsule 1 g, 1 g, Oral, Daily, Mansy, Jan A, MD .  ondansetron [facility]) injection 4 mg, 4 mg, Intravenous, Q6H PRN, Mansy, Jan  A, MD .  pantoprazole (PROTONIX) EC tablet 20 mg, 20 mg, Oral, Daily, Mansy, Jan A, MD .  sodium chloride flush (NS) 0.9 % injection 3 mL, 3 mL, Intravenous, Q12H, Mansy, Jan A, MD, 3 mL at 08/18/19 1159 .  sodium chloride flush (NS)  0.9 % injection 3 mL, 3 mL, Intravenous, PRN, Mansy, Jan A, MD .  sulfamethoxazole-trimethoprim (BACTRIM DS) 800-160 MG per tablet 1 tablet, 1 tablet, Oral, Once per day on Mon Wed Fri, Mansy, Jan A, MD, 1 tablet at 08/18/19 [id number]   Physical exam:  Vitals:   08/18/19 1053 08/18/19 1100 08/18/19 1147 08/18/19 1430  BP:  111/62    Pulse:  98  97  Resp:  (!) 23  (!) 24  Temp: 97.9 F (36.6 C)     TempSrc: Axillary     SpO2:  99% 96% 95%  Weight: 189 lb 9.5 oz (86 kg)     Height: 5\' 3"  (1.6 m)      Physical Exam Constitutional:      Comments: On 6L O2  HENT:     Head: Normocephalic and atraumatic.  Eyes:     Pupils: Pupils are equal, round, and reactive to light.  Cardiovascular:     Rate and Rhythm: Regular rhythm. Tachycardia present.     Heart sounds: Normal heart sounds.  Pulmonary:     Comments: Effort increased. Scattered b/l rhonchi and wheezing Abdominal:     General: Bowel sounds are normal.     Palpations: Abdomen is soft.  Musculoskeletal:     Cervical back: Normal range of motion.  Skin:    General: Skin is warm and dry.  Neurological:     Mental Status: She is alert and oriented to person, place, and time.        CMP Latest Ref Rng & Units 08/18/2019  Glucose 70 - 99 mg/dL 131(H)  BUN 6 - 20 mg/dL 30(H)  Creatinine 0.44 - 1.00 mg/dL 0.74  Sodium 135 - 145 mmol/L 139  Potassium 3.5 - 5.1 mmol/L 3.6  Chloride 98 - 111 mmol/L 96(L)  CO2 22 - 32 mmol/L 28  Calcium 8.9 - 10.3 mg/dL 9.9  Total Protein 6.5 - 8.1 g/dL 6.9  Total Bilirubin 0.3 - 1.2 mg/dL 0.9  Alkaline Phos 38 - 126 U/L 105  AST 15 - 41 U/L 26  ALT 0 - 44 U/L 26   CBC Latest Ref Rng & Units 08/18/2019  WBC 4.0 - 10.5 K/uL 13.9(H)  Hemoglobin 12.0 - 15.0 g/dL  13.5  Hematocrit 36.0 - 46.0 % 44.2  Platelets 150 - 400 K/uL 181    @IMAGES @  DG Chest 1 View  Result Date: 08/18/2019 CLINICAL DATA:  59 year old female with shortness of breath. EXAM: CHEST  1 VIEW COMPARISON:  Chest radiograph dated 08/14/2019 and CT dated 08/06/2019 FINDINGS: Right hilar post treatment changes. There is diffuse interstitial prominence and lower lung field hazy densities likely representing edema. Pneumonia is not excluded. Clinical correlation is recommended. There is no pneumothorax. There is a small right pleural effusion. Stable cardiac silhouette. Left pectoral Port-A-Cath with tip close to the cavoatrial junction. Atherosclerotic calcification of the aorta. No acute osseous pathology. IMPRESSION: Findings likely representing edema. Pneumonia is not excluded. Electronically Signed   By: Anner Crete M.D.   On: 08/18/2019 00:50   CT Angio Chest PE W and/or Wo Contrast  Result Date: 08/06/2019 CLINICAL DATA:  Hemoptysis, history of breast cancer and COVID EXAM: CT ANGIOGRAPHY CHEST WITH CONTRAST TECHNIQUE: Multidetector CT imaging of the chest was performed using the standard protocol during bolus administration of intravenous contrast. Multiplanar CT image reconstructions and MIPs were obtained to evaluate the vascular anatomy. CONTRAST:  48mL OMNIPAQUE IOHEXOL 350 MG/ML SOLN COMPARISON:  July 09, 2019 FINDINGS: Cardiovascular: There is  a optimal opacification of the pulmonary arteries. There is no central,segmental, or subsegmental filling defects within the pulmonary arteries. A left-sided MediPort catheter is seen with the tip in the right atrium. The heart is normal in size. No pericardial effusion or thickening. No evidence right heart strain. There is normal three-vessel brachiocephalic anatomy without proximal stenosis. Scattered mild aortic atherosclerosis. Mediastinum/Nodes: No hilar, mediastinal, or axillary adenopathy. Thyroid gland, trachea, and esophagus  demonstrate no significant findings. Lungs/Pleura: There is new/worsening multifocal patchy predominantly peripherally based ground-glass opacities seen throughout both lungs. There is also slight interval worsening in the tree-in-bud nodular hazy opacities within the left lung apex. There is unchanged spiculated masslike consolidation within the right infrahilar region, likely from post treatment changes/fibrosis. There is a trace right pleural effusion present. Upper Abdomen: No acute abnormalities present in the visualized portions of the upper abdomen. Musculoskeletal: No chest wall abnormality. No acute or significant osseous findings. Again noted is diffuse skin thickening seen along the right breast. There is a surgical clips within the right breast. Review of the MIP images confirms the above findings. IMPRESSION: 1. No central, segmental, or subsegmental pulmonary embolism. 2. Interval worsening of the multifocal patchy ground-glass opacities throughout both lungs, consistent with multifocal pneumonia. 3. Interval slight worsening in the tree-in-bud nodular opacities within the left upper lobe, likely due to atypical infectious etiology, however cannot exclude underlying metastatic disease. 4. Post treatment radiation/fibrotic changes in the right infrahilar region. 5. Trace right pleural effusion Electronically Signed   By: Prudencio Pair M.D.   On: 08/06/2019 00:17   DG Chest Port 1 View  Result Date: 08/14/2019 CLINICAL DATA:  Acute respiratory failure. EXAM: PORTABLE CHEST 1 VIEW COMPARISON:  Chest x-ray 08/13/2019 FINDINGS: The left subclavian power port is stable. Stable radiation changes involving the right hilum and right paramediastinal lung. Persistent patchy nodular infiltrates and small right effusion. No pneumothorax. IMPRESSION: Stable chest x-ray. Persistent patchy nodular infiltrates and small right effusion. Electronically Signed   By: Marijo Sanes M.D.   On: 08/14/2019 06:19   DG  Chest Port 1 View  Result Date: 08/13/2019 CLINICAL DATA:  Shortness of breath, tachypnea with decreased oxygen saturations., history of breast cancer. EXAM: PORTABLE CHEST 1 VIEW COMPARISON:  08/06/2019, CT of the chest. FINDINGS: Masslike appearance of right hilum similar to previous exam. Cardiomediastinal contours are stable. Left Port-A-Cath terminates at the caval to atrial junction. Nodular opacities are again suggested and there is increase in interstitial prominence when compared to the chest x-ray from 08/05/2019. Blunting of right costophrenic angles suggest scarring or small effusion. No signs of dense consolidation aside from perihilar changes discussed above. Visualized skeletal structures without acute bone finding. IMPRESSION: Masslike appearance of right hilum similar to previous exam. Nodular opacities are again suggested and there is increase in interstitial prominence when compared to the chest x-ray from 08/05/2019. Findings may represent worsening of pulmonary parenchymal disease, associated with previously reported COVID-19 infection, particularly in the left lung base, potentially with a background of pulmonary edema. Electronically Signed   By: Zetta Bills M.D.   On: 08/13/2019 15:26   DG Chest Portable 1 View  Result Date: 08/05/2019 CLINICAL DATA:  59 year old female with hemoptysis. History of breast cancer. EXAM: PORTABLE CHEST 1 VIEW COMPARISON:  Chest radiograph dated 04/09/2017. CT dated 07/09/2019. FINDINGS: Port-A-Cath with tip in the region of the cavoatrial junction. Right hilar density corresponding to the post treatment/post radiation fibrosis seen on the prior CT. Diffuse interstitial and vascular prominence with scattered  faint reticulonodular densities throughout the lungs. Probable small bilateral pleural effusions. No pneumothorax. The cardiac silhouette is within normal limits. Atherosclerotic calcification of the aorta. No acute osseous pathology. IMPRESSION:  Right hilar post radiation fibrosis and scattered bilateral reticulonodular densities and small bilateral pleural effusions. Overall findings are similar to the CT of 07/09/2019. Electronically Signed   By: Anner Crete M.D.   On: 08/05/2019 22:59   ECHOCARDIOGRAM COMPLETE  Result Date: 08/14/2019    ECHOCARDIOGRAM REPORT   Patient Name:   LOUAN MCMAIN Date of Exam: 08/14/2019 Medical Rec #:  [id number]          Height:       63.0 in Accession #:    [id number]         Weight:       198.0 lb Date of Birth:  August 07, 1960         BSA:          1.925 m Patient Age:    61 years           BP:           105/71 mmHg Patient Gender: F                  HR:           95 bpm. Exam Location:  [facility] Procedure: 2D Echo, Cardiac Doppler and Color Doppler Indications:     Dyspnea 786.09  History:         Patient has prior history of Echocardiogram examinations, most                  recent 04/22/2019. Breast cancer, covid-19, history of                  chemotherapy.  Sonographer:     [name] RDCS (AE) Referring Phys:  [id number] [facility] AMIN Diagnosing Phys: Serafina Royals MD  Sonographer Comments: Suboptimal apical window. IMPRESSIONS  1. Left ventricular ejection fraction, by estimation, is 55 to 60%. The left ventricle has normal function. The left ventricle has no regional wall motion abnormalities. Left ventricular diastolic parameters were normal.  2. Right ventricular systolic function is normal. The right ventricular size is normal. There is normal pulmonary artery systolic pressure.  3. The mitral valve is normal in structure. Trivial mitral valve regurgitation.  4. The aortic valve is normal in structure. Aortic valve regurgitation is not visualized. FINDINGS  Left Ventricle: Left ventricular ejection fraction, by estimation, is 55 to 60%. The left ventricle has normal function. The left ventricle has no regional wall motion abnormalities. The left ventricular internal cavity size was normal in size. There is   no left ventricular hypertrophy. Left ventricular diastolic parameters were normal. Right Ventricle: The right ventricular size is normal. No increase in right ventricular wall thickness. Right ventricular systolic function is normal. There is normal pulmonary artery systolic pressure. The tricuspid regurgitant velocity is 1.87 m/s, and  with an assumed right atrial pressure of 10 mmHg, the estimated right ventricular systolic pressure is 0000000 mmHg. Left Atrium: Left atrial size was normal in size. Right Atrium: Right atrial size was normal in size. Pericardium: There is no evidence of pericardial effusion. Mitral Valve: The mitral valve is normal in structure. Trivial mitral valve regurgitation. Tricuspid Valve: The tricuspid valve is normal in structure. Tricuspid valve regurgitation is trivial. Aortic Valve: The aortic valve is normal in structure. Aortic valve regurgitation is not visualized. Aortic valve mean gradient measures 2.0  mmHg. Aortic valve peak gradient measures 3.3 mmHg. Aortic valve area, by VTI measures 3.78 cm. Pulmonic Valve: The pulmonic valve was normal in structure. Pulmonic valve regurgitation is not visualized. Aorta: The aortic root and ascending aorta are structurally normal, with no evidence of dilitation. IAS/Shunts: No atrial level shunt detected by color flow Doppler.  LEFT VENTRICLE PLAX 2D LVIDd:         3.86 cm  Diastology LVIDs:         2.43 cm  LV e' lateral:   9.79 cm/s LV PW:         0.82 cm  LV E/e' lateral: 6.0 LV IVS:        0.82 cm  LV e' medial:    6.96 cm/s LVOT diam:     2.00 cm  LV E/e' medial:  8.4 LV SV:         44 LV SV Index:   23 LVOT Area:     3.14 cm  RIGHT VENTRICLE RV Basal diam:  3.94 cm TAPSE (M-mode): 3.5 cm LEFT ATRIUM           Index       RIGHT ATRIUM           Index LA diam:      2.00 cm 1.04 cm/m  RA Area:     19.40 cm LA Vol (A2C): 24.8 ml 12.88 ml/m RA Volume:   58.80 ml  30.54 ml/m LA Vol (A4C): 24.8 ml 12.88 ml/m  AORTIC VALVE                    PULMONIC VALVE AV Area (Vmax):    3.11 cm    PV Vmax:        0.48 m/s AV Area (Vmean):   3.48 cm    PV Peak grad:   0.9 mmHg AV Area (VTI):     3.78 cm    RVOT Peak grad: 1 mmHg AV Vmax:           90.35 cm/s AV Vmean:          62.550 cm/s AV VTI:            0.115 m AV Peak Grad:      3.3 mmHg AV Mean Grad:      2.0 mmHg LVOT Vmax:         89.40 cm/s LVOT Vmean:        69.200 cm/s LVOT VTI:          0.139 m LVOT/AV VTI ratio: 1.20  AORTA Ao Root diam: 2.50 cm MITRAL VALVE               TRICUSPID VALVE MV Area (PHT): 8.62 cm    TR Peak grad:   14.0 mmHg MV Decel Time: 88 msec     TR Vmax:        187.00 cm/s MV E velocity: 58.70 cm/s MV A velocity: 86.80 cm/s  SHUNTS MV E/A ratio:  0.68        Systemic VTI:  0.14 m                            Systemic Diam: 2.00 cm Serafina Royals MD Electronically signed by Serafina Royals MD Signature Date/Time: 08/14/2019/1:54:24 PM    Final     Assessment and plan- Patient is a 59 y.o. female admitted for acute on chronic hypoxic respiratory failure  Patient seems  to have improvement in her symptoms after diuresis and increasing the dose of her steroids.  Also she has had mainly lymph node involvement with a triple negative breast cancer and hilar lymphadenopathy which has not worsened in her recent scans.  It would therefore be hard to argue that her present respiratory symptoms are secondary to worsening breast cancer.  Gemcitabine-induced pneumonitis is a possibility.  However with her recent Covid infection, prior radiation-induced pneumonitis- it is difficult to say which of these factors contributing to her acute decompensation.  Ideally she needs bronchoscopy for definitive diagnosis but given that she is currently having increased oxygen requirements that may not be possible.  She is currently being treated with IV Lasix, high-dose steroids Solu-Medrol 40 mg IV every 8 and ceftriaxone and azithromycin.  Recommend having pulmonary on board and continue presnt  measures  Discussed outpatient [facility] second opinion if she is stable for discharge  Discussed code status and intubation. Patient is leaning towards trial of intubation if it comes to it. Would recommend getting bronchoscopy if she is intubated.  Prn morphine and ativan for dyspnea  I have discussed this in detail with patient, her husband, Dr. Patsey Berthold and Np Vonna Kotyk Borders from palliative care    Visit Diagnosis 1. Acute respiratory failure with hypoxia (HCC)   2. Carcinoma of right breast metastatic to lung [facility])                                                                                 3. Goals of care counselling/discussion  Dr. Randa Evens, MD, MPH [facility] at [facility] [id number] 08/18/2019  4:12 PM

## 2019-08-18 NOTE — Consult Note (Signed)
[place]  [phone] [phone]   Name: Ashley Molina Date: 08/18/2019 MRN: [id number]  DOB: 15-Jan-1961  Patient Care Team: Patient, No Pcp Per as PCP - General ([place]) Bary Castilla, Forest Gleason, MD (General Surgery) Gae Dry, MD as Referring Physician (Obstetrics and Gynecology)    REASON FOR CONSULTATION: Ashley Molina is a 59 y.o. 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 Patient had COVID-19 in December 2020.  PMH also notable for O2 dependent COPD.  She has several recent hospitalizations including 08/06/2019-08/07/2019 with hypoxic respiratory failure thought secondary to pneumonitis secondary to COVID-19 versus gemcitabine.  She was readmitted 08/13/2019-08/16/2019 with same.  She is now readmitted 08/17/2019 again with hypoxic respiratory failure.  Palliative care was consulted help address goals.  SOCIAL HISTORY:     reports that she has never smoked. She has never used smokeless tobacco. She reports previous alcohol use. She reports that she does not use drugs.   Patient is married and lives at home with her husband.  They have 3 children.  Patient works at [employer]:  Not on file  CODE STATUS: Full code  PAST MEDICAL HISTORY: Past Medical History:  Diagnosis Date  . Anemia   . Breast cancer ([place])   . Breast cancer, right ([employer]) 04/2017   Hx Lumpectomy, Chemo + Rad tx's.  . Breast cyst, right    aspirated by Dr. Bary Castilla  . COVID-19   . Hyperlipidemia   . Personal history of chemotherapy   . Pre-diabetes     PAST SURGICAL HISTORY:  Past Surgical History:  Procedure Laterality Date  . AXILLARY LYMPH NODE BIOPSY Right 04/09/2017   Procedure: AXILLARY LYMPH NODE BIOPSY;  Surgeon: Robert Bellow, MD;  Location: [facility] ORS;  Service:  General;  Laterality: Right;  . BREAST BIOPSY Right 03/27/2017   US guided breast mass - invasive mammary carcinoma  . BREAST BIOPSY Right 03/27/2017   Lymph node - metastatic carcinoma  . BREAST BIOPSY Right 04/09/2017   Lymph node   . BREAST CYST ASPIRATION Right 11/2009   Dr. Bary Castilla did FNA  . BREAST EXCISIONAL BIOPSY Right 09/28/2017   lumpectomy and 12 lympnode rad neo adj chemo  . COLONOSCOPY  2011  . DILATION AND CURETTAGE OF UTERUS     X3  . ENDOBRONCHIAL ULTRASOUND N/A 04/09/2017   Procedure: ENDOBRONCHIAL ULTRASOUND;  Surgeon: Laverle Hobby, MD;  Location: [facility] ORS;  Service: Pulmonary;  Laterality: N/A;  . ENDOMETRIAL ABLATION    . ENDOMETRIAL BIOPSY  09/2009  . PORTACATH PLACEMENT Left 04/09/2017   Procedure: INSERTION PORT-A-CATH;  Surgeon: Robert Bellow, MD;  Location: [facility] ORS;  Service: General;  Laterality: Left;    HEMATOLOGY/ONCOLOGY HISTORY:  Oncology History  Malignant neoplasm of upper-outer quadrant of right breast in female, estrogen receptor negative ([place])  04/04/2017 Cancer Staging   Staging form: Breast, AJCC 8th Edition - Clinical stage from 04/04/2017: Stage IV (cT3, cN1, cM1, G3, ER-, PR+, HER2-) - Signed by Sindy Guadeloupe, MD on 09/18/2017   04/05/2017 Initial Diagnosis   Malignant neoplasm of upper-outer quadrant of right breast in female, estrogen receptor negative ([place])   09/12/2018 - 01/08/2019 Chemotherapy   The patient had pegfilgrastim (NEULASTA ONPRO KIT) injection 6 mg, 6 mg, Subcutaneous, Once, 4 of 5 cycles Administration: 6 mg (10/03/2018), 6 mg (11/01/2018), 6 mg (10/17/2018),  6 mg (11/14/2018), 6 mg (11/28/2018), 6 mg (12/12/2018), 6 mg (12/26/2018) eriBULin mesylate (HALAVEN) 2.85 mg in sodium chloride 0.9 % 100 mL chemo infusion, 1.4 mg/m2 = 2.85 mg, Intravenous,  Once, 4 of 5 cycles Dose modification: 1.05 mg/m2 (original dose 1.4 mg/m2, Cycle 1, Reason: Other (see comments), Comment: neutropenia), 1.4 mg/m2 (original dose 1.4 mg/m2,  Cycle 2, Reason: Other (see comments)) Administration: 2.85 mg (09/12/2018), 2.15 mg (10/03/2018), 2.85 mg (11/01/2018), 2.85 mg (10/17/2018), 2.85 mg (11/14/2018), 2.85 mg (11/28/2018), 2.85 mg (12/12/2018), 2.85 mg (12/26/2018)  for chemotherapy treatment.    01/20/2019 -  Chemotherapy   The patient had palonosetron (ALOXI) injection 0.25 mg, 0.25 mg, Intravenous,  Once, 8 of 9 cycles Administration: 0.25 mg (01/30/2019), 0.25 mg (02/20/2019), 0.25 mg (03/21/2019), 0.25 mg (04/10/2019), 0.25 mg (06/05/2019), 0.25 mg (07/17/2019) pegfilgrastim (NEULASTA ONPRO KIT) injection 6 mg, 6 mg, Subcutaneous, Once, 7 of 8 cycles Administration: 6 mg (01/30/2019), 6 mg (03/07/2019), 6 mg (03/27/2019), 6 mg (04/17/2019), 6 mg (06/12/2019), 6 mg (07/03/2019), 6 mg (07/24/2019) CARBOplatin (PARAPLATIN) 260 mg in sodium chloride 0.9 % 250 mL chemo infusion, 260 mg (100 % of original dose 255 mg), Intravenous,  Once, 2 of 2 cycles Dose modification:   (original dose 255 mg, Cycle 1), 260 mg (original dose 255 mg, Cycle 2) Administration: 260 mg (01/20/2019), 260 mg (01/30/2019), 260 mg (02/20/2019) gemcitabine (GEMZAR) 1,596 mg in sodium chloride 0.9 % 250 mL chemo infusion, 800 mg/m2 = 1,596 mg (100 % of original dose 800 mg/m2), Intravenous,  Once, 8 of 9 cycles Dose modification: 800 mg/m2 (original dose 800 mg/m2, Cycle 1, Reason: Other (see comments), Comment: chemo induced neutropenia) Administration: 1,596 mg (01/20/2019), 1,600 mg (01/30/2019), 1,600 mg (02/20/2019), 1,600 mg (03/07/2019), 1,600 mg (03/21/2019), 1,600 mg (03/27/2019), 1,600 mg (04/10/2019), 1,600 mg (04/17/2019), 1,600 mg (05/06/2019), 1,600 mg (06/05/2019), 1,600 mg (06/12/2019), 1,600 mg (06/26/2019), 1,600 mg (07/03/2019), 1,600 mg (07/17/2019), 1,600 mg (07/24/2019)  for chemotherapy treatment.      ALLERGIES:  is allergic to penicillins.  MEDICATIONS:  Current Facility-Administered Medications  Medication Dose Route Frequency Provider Last Rate Last Admin  . 0.9  %  sodium chloride infusion  250 mL Intravenous PRN Mansy, Jan A, MD      . acetaminophen (TYLENOL) tablet 650 mg  650 mg Oral Q4H PRN Mansy, Jan A, MD      . aspirin EC tablet 81 mg  81 mg Oral Daily Mansy, Jan A, MD      . azithromycin (ZITHROMAX) 500 mg in sodium chloride 0.9 % 250 mL IVPB  500 mg Intravenous Q24H Mansy, Jan A, MD      . budesonide (PULMICORT) nebulizer solution 0.5 mg  0.5 mg Nebulization BID Mansy, Jan A, MD   0.5 mg at 08/18/19 6237  . cefTRIAXone (ROCEPHIN) 2 g in sodium chloride 0.9 % 100 mL IVPB  2 g Intravenous Q24H Mansy, Arvella Merles, MD   Stopped at 08/18/19 1159  . Chlorhexidine Gluconate Cloth 2 % PADS 6 each  6 each Topical Daily Elodia Florence., MD   6 each at 08/18/19 1401  . chlorpheniramine-HYDROcodone (TUSSIONEX) 10-8 MG/5ML suspension 5 mL  5 mL Oral Q12H Mansy, Jan A, MD   5 mL at 08/18/19 0924  . cholecalciferol (VITAMIN D3) tablet 1,000 Units  1,000 Units Oral QHS Mansy, Jan A, MD      . enoxaparin (LOVENOX) injection 40 mg  40 mg Subcutaneous Q24H Mansy, Arvella Merles, MD      .  ferrous sulfate tablet 325 mg  325 mg Oral QHS Mansy, Jan A, MD      . furosemide (LASIX) injection 40 mg  40 mg Intravenous Q12H Mansy, Jan A, MD      . guaiFENesin-dextromethorphan (ROBITUSSIN DM) 100-10 MG/5ML syrup 15 mL  15 mL Oral Q4H PRN Mansy, Jan A, MD      . ipratropium (ATROVENT) nebulizer solution 0.5 mg  0.5 mg Nebulization Q6H Elodia Florence., MD   0.5 mg at 08/18/19 1427  . ipratropium-albuterol (DUONEB) 0.5-2.5 (3) MG/3ML nebulizer solution 3 mL  3 mL Nebulization Once Blake Divine, MD      . levalbuterol [facility]) nebulizer solution 0.63 mg  0.63 mg Nebulization Q6H Elodia Florence., MD   0.63 mg at 08/18/19 1427  . LORazepam (ATIVAN) tablet 0.5 mg  0.5 mg Oral Q6H PRN Ahmet Schank, Kirt Boys, NP      . methylPREDNISolone sodium succinate (SOLU-MEDROL) 40 mg/mL injection 40 mg  40 mg Intravenous Q8H Mansy, Jan A, MD   40 mg at 08/18/19 1400  . metoprolol tartrate  (LOPRESSOR) tablet 25 mg  25 mg Oral Once Blake Divine, MD   Stopped at 08/18/19 0424  . metoprolol tartrate (LOPRESSOR) tablet 25 mg  25 mg Oral BID Mansy, Arvella Merles, MD   Stopped at 08/18/19 [phone]  . morphine 2 MG/ML injection 1-2 mg  1-2 mg Intravenous Q2H PRN Klea Nall, Kirt Boys, NP      . multivitamin with minerals tablet 1 tablet  1 tablet Oral QHS Mansy, Jan A, MD      . omega-3 acid ethyl esters (LOVAZA) capsule 1 g  1 g Oral Daily Mansy, Jan A, MD      . ondansetron [facility]) injection 4 mg  4 mg Intravenous Q6H PRN Mansy, Jan A, MD      . pantoprazole (PROTONIX) EC tablet 20 mg  20 mg Oral Daily Mansy, Jan A, MD      . sodium chloride flush (NS) 0.9 % injection 3 mL  3 mL Intravenous Q12H Mansy, Jan A, MD   3 mL at 08/18/19 1159  . sodium chloride flush (NS) 0.9 % injection 3 mL  3 mL Intravenous PRN Mansy, Jan A, MD      . sulfamethoxazole-trimethoprim (BACTRIM DS) 800-160 MG per tablet 1 tablet  1 tablet Oral Once per day on Mon Wed Fri Mansy, Jan A, MD   1 tablet at 08/18/19 0924    VITAL SIGNS: BP 111/62   Pulse 97   Temp 97.9 F (36.6 C) (Axillary)   Resp (!) 24   Ht 5' 3"  (1.6 m)   Wt 189 lb 9.5 oz (86 kg)   LMP 04/04/2012 (Approximate)   SpO2 95%   BMI 33.59 kg/m  Filed Weights   08/17/19 2352 08/18/19 1053  Weight: 198 lb 6.6 oz (90 kg) 189 lb 9.5 oz (86 kg)    Estimated body mass index is 33.59 kg/m as calculated from the following:   Height as of this encounter: 5' 3"  (1.6 m).   Weight as of this encounter: 189 lb 9.5 oz (86 kg).  LABS: CBC:    Component Value Date/Time   WBC 13.9 (H) 08/18/2019 0004   HGB 13.5 08/18/2019 0004   HGB 12.9 03/28/2017 0805   HCT 44.2 08/18/2019 0004   HCT 38.2 03/28/2017 0805   PLT 181 08/18/2019 0004   PLT 245 03/28/2017 0805   MCV 94.2 08/18/2019 0004   MCV 81 03/28/2017  0805   NEUTROABS 15.7 (H) 08/13/2019 1502   NEUTROABS 2.7 03/28/2017 0805   LYMPHSABS 0.9 08/13/2019 1502   LYMPHSABS 1.1 03/28/2017 0805   MONOABS 0.3  08/13/2019 1502   EOSABS 0.0 08/13/2019 1502   EOSABS 0.1 03/28/2017 0805   BASOSABS 0.1 08/13/2019 1502   BASOSABS 0.1 03/28/2017 0805   Comprehensive Metabolic Panel:    Component Value Date/Time   NA 139 08/18/2019 0045   NA 140 03/28/2017 0805   K 3.6 08/18/2019 0045   CL 96 (L) 08/18/2019 0045   CO2 28 08/18/2019 0045   BUN 30 (H) 08/18/2019 0045   BUN 15 03/28/2017 0805   CREATININE 0.74 08/18/2019 0045   GLUCOSE 131 (H) 08/18/2019 0045   CALCIUM 9.9 08/18/2019 0045   AST 26 08/18/2019 0045   ALT 26 08/18/2019 0045   ALKPHOS 105 08/18/2019 0045   BILITOT 0.9 08/18/2019 0045   BILITOT 0.4 03/28/2017 0805   PROT 6.9 08/18/2019 0045   PROT 6.9 03/28/2017 0805   ALBUMIN 4.0 08/18/2019 0045   ALBUMIN 4.3 03/28/2017 0805    RADIOGRAPHIC STUDIES: DG Chest 1 View  Result Date: 08/18/2019 CLINICAL DATA:  59 year old female with shortness of breath. EXAM: CHEST  1 VIEW COMPARISON:  Chest radiograph dated 08/14/2019 and CT dated 08/06/2019 FINDINGS: Right hilar post treatment changes. There is diffuse interstitial prominence and lower lung field hazy densities likely representing edema. Pneumonia is not excluded. Clinical correlation is recommended. There is no pneumothorax. There is a small right pleural effusion. Stable cardiac silhouette. Left pectoral Port-A-Cath with tip close to the cavoatrial junction. Atherosclerotic calcification of the aorta. No acute osseous pathology. IMPRESSION: Findings likely representing edema. Pneumonia is not excluded. Electronically Signed   By: Anner Crete M.D.   On: 08/18/2019 00:50   CT Angio Chest PE W and/or Wo Contrast  Result Date: 08/06/2019 CLINICAL DATA:  Hemoptysis, history of breast cancer and COVID EXAM: CT ANGIOGRAPHY CHEST WITH CONTRAST TECHNIQUE: Multidetector CT imaging of the chest was performed using the standard protocol during bolus administration of intravenous contrast. Multiplanar CT image reconstructions and MIPs were  obtained to evaluate the vascular anatomy. CONTRAST:  47m OMNIPAQUE IOHEXOL 350 MG/ML SOLN COMPARISON:  July 09, 2019 FINDINGS: Cardiovascular: There is a optimal opacification of the pulmonary arteries. There is no central,segmental, or subsegmental filling defects within the pulmonary arteries. A left-sided MediPort catheter is seen with the tip in the right atrium. The heart is normal in size. No pericardial effusion or thickening. No evidence right heart strain. There is normal three-vessel brachiocephalic anatomy without proximal stenosis. Scattered mild aortic atherosclerosis. Mediastinum/Nodes: No hilar, mediastinal, or axillary adenopathy. Thyroid gland, trachea, and esophagus demonstrate no significant findings. Lungs/Pleura: There is new/worsening multifocal patchy predominantly peripherally based ground-glass opacities seen throughout both lungs. There is also slight interval worsening in the tree-in-bud nodular hazy opacities within the left lung apex. There is unchanged spiculated masslike consolidation within the right infrahilar region, likely from post treatment changes/fibrosis. There is a trace right pleural effusion present. Upper Abdomen: No acute abnormalities present in the visualized portions of the upper abdomen. Musculoskeletal: No chest wall abnormality. No acute or significant osseous findings. Again noted is diffuse skin thickening seen along the right breast. There is a surgical clips within the right breast. Review of the MIP images confirms the above findings. IMPRESSION: 1. No central, segmental, or subsegmental pulmonary embolism. 2. Interval worsening of the multifocal patchy ground-glass opacities throughout both lungs, consistent with multifocal pneumonia. 3. Interval  slight worsening in the tree-in-bud nodular opacities within the left upper lobe, likely due to atypical infectious etiology, however cannot exclude underlying metastatic disease. 4. Post treatment  radiation/fibrotic changes in the right infrahilar region. 5. Trace right pleural effusion Electronically Signed   By: Prudencio Pair M.D.   On: 08/06/2019 00:17   DG Chest Port 1 View  Result Date: 08/14/2019 CLINICAL DATA:  Acute respiratory failure. EXAM: PORTABLE CHEST 1 VIEW COMPARISON:  Chest x-ray 08/13/2019 FINDINGS: The left subclavian power port is stable. Stable radiation changes involving the right hilum and right paramediastinal lung. Persistent patchy nodular infiltrates and small right effusion. No pneumothorax. IMPRESSION: Stable chest x-ray. Persistent patchy nodular infiltrates and small right effusion. Electronically Signed   By: Marijo Sanes M.D.   On: 08/14/2019 06:19   DG Chest Port 1 View  Result Date: 08/13/2019 CLINICAL DATA:  Shortness of breath, tachypnea with decreased oxygen saturations., history of breast cancer. EXAM: PORTABLE CHEST 1 VIEW COMPARISON:  08/06/2019, CT of the chest. FINDINGS: Masslike appearance of right hilum similar to previous exam. Cardiomediastinal contours are stable. Left Port-A-Cath terminates at the caval to atrial junction. Nodular opacities are again suggested and there is increase in interstitial prominence when compared to the chest x-ray from 08/05/2019. Blunting of right costophrenic angles suggest scarring or small effusion. No signs of dense consolidation aside from perihilar changes discussed above. Visualized skeletal structures without acute bone finding. IMPRESSION: Masslike appearance of right hilum similar to previous exam. Nodular opacities are again suggested and there is increase in interstitial prominence when compared to the chest x-ray from 08/05/2019. Findings may represent worsening of pulmonary parenchymal disease, associated with previously reported COVID-19 infection, particularly in the left lung base, potentially with a background of pulmonary edema. Electronically Signed   By: Zetta Bills M.D.   On: 08/13/2019 15:26   DG  Chest Portable 1 View  Result Date: 08/05/2019 CLINICAL DATA:  59 year old female with hemoptysis. History of breast cancer. EXAM: PORTABLE CHEST 1 VIEW COMPARISON:  Chest radiograph dated 04/09/2017. CT dated 07/09/2019. FINDINGS: Port-A-Cath with tip in the region of the cavoatrial junction. Right hilar density corresponding to the post treatment/post radiation fibrosis seen on the prior CT. Diffuse interstitial and vascular prominence with scattered faint reticulonodular densities throughout the lungs. Probable small bilateral pleural effusions. No pneumothorax. The cardiac silhouette is within normal limits. Atherosclerotic calcification of the aorta. No acute osseous pathology. IMPRESSION: Right hilar post radiation fibrosis and scattered bilateral reticulonodular densities and small bilateral pleural effusions. Overall findings are similar to the CT of 07/09/2019. Electronically Signed   By: Anner Crete M.D.   On: 08/05/2019 22:59   ECHOCARDIOGRAM COMPLETE  Result Date: 08/14/2019    ECHOCARDIOGRAM REPORT   Patient Name:   LOLITHA TORTORA Date of Exam: 08/14/2019 Medical Rec #:  [id number]          Height:       63.0 in Accession #:    [id number]         Weight:       198.0 lb Date of Birth:  [birth year]         BSA:          1.925 m Patient Age:    58 years           BP:           105/71 mmHg Patient Gender: F  HR:           95 bpm. Exam Location:  [facility] Procedure: 2D Echo, Cardiac Doppler and Color Doppler Indications:     Dyspnea 786.09  History:         Patient has prior history of Echocardiogram examinations, most                  recent 04/22/2019. Breast cancer, covid-19, history of                  chemotherapy.  Sonographer:     [name] RDCS (AE) Referring Phys:  [id number] [facility] AMIN Diagnosing Phys: Serafina Royals MD  Sonographer Comments: Suboptimal apical window. IMPRESSIONS  1. Left ventricular ejection fraction, by estimation, is 55 to 60%. The left ventricle has  normal function. The left ventricle has no regional wall motion abnormalities. Left ventricular diastolic parameters were normal.  2. Right ventricular systolic function is normal. The right ventricular size is normal. There is normal pulmonary artery systolic pressure.  3. The mitral valve is normal in structure. Trivial mitral valve regurgitation.  4. The aortic valve is normal in structure. Aortic valve regurgitation is not visualized. FINDINGS  Left Ventricle: Left ventricular ejection fraction, by estimation, is 55 to 60%. The left ventricle has normal function. The left ventricle has no regional wall motion abnormalities. The left ventricular internal cavity size was normal in size. There is  no left ventricular hypertrophy. Left ventricular diastolic parameters were normal. Right Ventricle: The right ventricular size is normal. No increase in right ventricular wall thickness. Right ventricular systolic function is normal. There is normal pulmonary artery systolic pressure. The tricuspid regurgitant velocity is 1.87 m/s, and  with an assumed right atrial pressure of 10 mmHg, the estimated right ventricular systolic pressure is 67.3 mmHg. Left Atrium: Left atrial size was normal in size. Right Atrium: Right atrial size was normal in size. Pericardium: There is no evidence of pericardial effusion. Mitral Valve: The mitral valve is normal in structure. Trivial mitral valve regurgitation. Tricuspid Valve: The tricuspid valve is normal in structure. Tricuspid valve regurgitation is trivial. Aortic Valve: The aortic valve is normal in structure. Aortic valve regurgitation is not visualized. Aortic valve mean gradient measures 2.0 mmHg. Aortic valve peak gradient measures 3.3 mmHg. Aortic valve area, by VTI measures 3.78 cm. Pulmonic Valve: The pulmonic valve was normal in structure. Pulmonic valve regurgitation is not visualized. Aorta: The aortic root and ascending aorta are structurally normal, with no evidence  of dilitation. IAS/Shunts: No atrial level shunt detected by color flow Doppler.  LEFT VENTRICLE PLAX 2D LVIDd:         3.86 cm  Diastology LVIDs:         2.43 cm  LV e' lateral:   9.79 cm/s LV PW:         0.82 cm  LV E/e' lateral: 6.0 LV IVS:        0.82 cm  LV e' medial:    6.96 cm/s LVOT diam:     2.00 cm  LV E/e' medial:  8.4 LV SV:         44 LV SV Index:   23 LVOT Area:     3.14 cm  RIGHT VENTRICLE RV Basal diam:  3.94 cm TAPSE (M-mode): 3.5 cm LEFT ATRIUM           Index       RIGHT ATRIUM           Index LA  diam:      2.00 cm 1.04 cm/m  RA Area:     19.40 cm LA Vol (A2C): 24.8 ml 12.88 ml/m RA Volume:   58.80 ml  30.54 ml/m LA Vol (A4C): 24.8 ml 12.88 ml/m  AORTIC VALVE                   PULMONIC VALVE AV Area (Vmax):    3.11 cm    PV Vmax:        0.48 m/s AV Area (Vmean):   3.48 cm    PV Peak grad:   0.9 mmHg AV Area (VTI):     3.78 cm    RVOT Peak grad: 1 mmHg AV Vmax:           90.35 cm/s AV Vmean:          62.550 cm/s AV VTI:            0.115 m AV Peak Grad:      3.3 mmHg AV Mean Grad:      2.0 mmHg LVOT Vmax:         89.40 cm/s LVOT Vmean:        69.200 cm/s LVOT VTI:          0.139 m LVOT/AV VTI ratio: 1.20  AORTA Ao Root diam: 2.50 cm MITRAL VALVE               TRICUSPID VALVE MV Area (PHT): 8.62 cm    TR Peak grad:   14.0 mmHg MV Decel Time: 88 msec     TR Vmax:        187.00 cm/s MV E velocity: 58.70 cm/s MV A velocity: 86.80 cm/s  SHUNTS MV E/A ratio:  0.68        Systemic VTI:  0.14 m                            Systemic Diam: 2.00 cm Serafina Royals MD Electronically signed by Serafina Royals MD Signature Date/Time: 08/14/2019/1:54:24 PM    Final     PERFORMANCE STATUS (ECOG) : 2 - Symptomatic, <50% confined to bed  Review of Systems Unless otherwise noted, a complete review of systems is negative.  Physical Exam General: NAD Cardiovascular: Regular rate and rhythm Pulmonary: Unlabored, on O2 Extremities: no edema, no joint deformities Skin: no rashes Neurological: Weakness  but otherwise nonfocal  IMPRESSION: I met with patient and husband in the ICU.  Patient says that she developed progressive dyspnea and hypoxia after discharging home from the hospital over the weekend.  She is being seen by pulmonary with consideration for second opinion at an academic medical center.  Patient remains in agreement with the current scope of treatment.  She is hopeful that she will be able to improve and stabilize her respiratory status over time.  CODE STATUS was previously discussed by PMT when she was here in the hospital last week and at that time patient desired full code/full scope of treatment.  Patient reports having some anxiety.  She would be interested in trying low-dose lorazepam and morphine to help control anxiety and dyspnea.  PLAN: -Continue current scope of treatment -Lorazepam/morphine as needed for anxiety/pain/dyspnea -Bedside fan -Full code -We will follow  Case and plan discussed with Drs. Janese Banks and Patsey Berthold  Time Total: 30 minutes  Visit consisted of counseling and education dealing with the complex and emotionally intense issues of symptom management and palliative care  in the setting of serious and potentially life-threatening illness.Greater than 50%  of this time was spent counseling and coordinating care related to the above assessment and plan.  Signed by: Altha Harm, PhD, NP-C

## 2019-08-18 NOTE — ED Notes (Addendum)
Pt ambulated to commode with assistance while remaining on 6L o2 via nasal cannula. Pt returned to bed but reported that she felt like she couldn't breathe. O2 sat noted at 84%. Pt returned to 15L via non rebreather. Dr Charna Archer notified.

## 2019-08-18 NOTE — ED Notes (Addendum)
Report given to Tiffany, RN in ICU

## 2019-08-18 NOTE — ED Notes (Signed)
Per Network engineer in ICU they will need to call me back to get report

## 2019-08-18 NOTE — ED Notes (Signed)
Spoke with Eduard Clos, RN in ICU who states she will let me know when bed is ready for pt

## 2019-08-18 NOTE — Telephone Encounter (Signed)
Oxygen saturations dropped after discharge and she had to go back to ER. She is currently awaiting ICU bed and is in ER.

## 2019-08-18 NOTE — TOC Initial Note (Addendum)
Transition of Care [facility]) - Initial/Assessment Note    Patient Details  Name: Ashley Molina MRN: [id number] Date of Birth: 1961/01/18  Transition of Care [facility]) CM/SW Contact:    Magnus Ivan, LCSW Phone Number: 08/18/2019, 1:26 PM  Clinical Narrative:               CSW consulted for Heart Failure Screen and PT/OT needs. Please submit PT/OT consult orders when appropriate. CSW will follow up on needs pending consults.   CSW called patient to complete Heart Failure Screen via phone due to Airborne Precautions. Patient reported she does have a scale at home and weighs herself weekly. CSW informed patient of recommendation to weigh herself daily and report to her Doctor if she has a 2 pound weight change in a day or 5 pounds in a week. Patient verbalized understanding and said she will monitor her weight.   Patient inquired about portable oxygen concentrator which was discussed at her last hospitalization. CSW sent message to [place] requesting follow up on this.   No changes reported since last Readmission Screen (last week). Patient lives with husband who is supportive. Patient does not have a PCP or feel she needs one. She follows up with Oncology regularly. Patient uses [facility] on [address]. Patient denied issues with obtaining medicines. Patient has oxygen at home through [facility]. Patient has not used Home Health or SNF in the past. Patient drives herself to appointments.    CSW will continue to follow.         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 COPD exacerbation ([place]) 08/18/2019  . Palliative care by specialist   . Respiratory failure ([place]) 08/13/2019  . Acute pulmonary edema (H[facility])   . Elevated troponin   . COVID-19 08/06/2019  . Pneumonitis 01/20/2019  . Goals of care, counseling/discussion 10/08/2018  . Chemotherapy induced neutropenia ([place]) 05/03/2017  . 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 Malignant neoplasm of upper-outer quadrant of right breast in female, estrogen receptor negative ([place]) 04/05/2017  . Anemia 03/14/2017  . Solitary cyst of breast, right 03/14/2017  . Pre-diabetes 03/14/2017  . Hypercholesterolemia 03/14/2017  . Mass of upper outer quadrant of right breast 03/14/2017   PCP:  Patient, No Pcp Per Pharmacy:   [facility] [place], [address] [place] [address] Phone: [phone] Fax: [place], [place]  [employer] [address] [place] [address] Phone: [phone] Fax: [phone]  [facility]) [place], [place] [address] Phone: [phone] Fax: [place], [place] - [address] [place] [address] Phone: [phone] Fax: [phone]     Social Determinants of Health (SDOH) Interventions    Readmission Risk Interventions Readmission Risk Prevention Plan 08/15/2019  Transportation Screening Complete  PCP or Specialist Appt within 3-5 Days Patient refused  [place] or Home Care Consult Complete  Medication Review (RN Care Manager) Complete  Some recent data might be hidden

## 2019-08-19 ENCOUNTER — Other Ambulatory Visit: Payer: Self-pay

## 2019-08-19 LAB — CBC WITH DIFFERENTIAL/PLATELET
Abs Immature Granulocytes: 0.1 10*3/uL — ABNORMAL HIGH (ref 0.00–0.07)
Basophils Absolute: 0 10*3/uL (ref 0.0–0.1)
Basophils Relative: 0 %
Eosinophils Absolute: 0 10*3/uL (ref 0.0–0.5)
Eosinophils Relative: 0 %
HCT: 39 % (ref 36.0–46.0)
Hemoglobin: 12.1 g/dL (ref 12.0–15.0)
Immature Granulocytes: 1 %
Lymphocytes Relative: 4 %
Lymphs Abs: 0.6 10*3/uL — ABNORMAL LOW (ref 0.7–4.0)
MCH: 28.4 pg (ref 26.0–34.0)
MCHC: 31 g/dL (ref 30.0–36.0)
MCV: 91.5 fL (ref 80.0–100.0)
Monocytes Absolute: 0.7 10*3/uL (ref 0.1–1.0)
Monocytes Relative: 5 %
Neutro Abs: 13.3 10*3/uL — ABNORMAL HIGH (ref 1.7–7.7)
Neutrophils Relative %: 90 %
Platelets: 186 10*3/uL (ref 150–400)
RBC: 4.26 MIL/uL (ref 3.87–5.11)
RDW: 15.8 % — ABNORMAL HIGH (ref 11.5–15.5)
WBC: 14.7 10*3/uL — ABNORMAL HIGH (ref 4.0–10.5)
nRBC: 0 % (ref 0.0–0.2)

## 2019-08-19 LAB — COMPREHENSIVE METABOLIC PANEL
ALT: 23 U/L (ref 0–44)
AST: 20 U/L (ref 15–41)
Albumin: 3.4 g/dL — ABNORMAL LOW (ref 3.5–5.0)
Alkaline Phosphatase: 88 U/L (ref 38–126)
Anion gap: 14 (ref 5–15)
BUN: 32 mg/dL — ABNORMAL HIGH (ref 6–20)
CO2: 31 mmol/L (ref 22–32)
Calcium: 10.1 mg/dL (ref 8.9–10.3)
Chloride: 96 mmol/L — ABNORMAL LOW (ref 98–111)
Creatinine, Ser: 0.81 mg/dL (ref 0.44–1.00)
GFR calc Af Amer: >60 mL/min (ref >60)
GFR calc non Af Amer: >60 mL/min (ref >60)
Glucose, Bld: 142 mg/dL — ABNORMAL HIGH (ref 70–99)
Potassium: 3.9 mmol/L (ref 3.5–5.1)
Sodium: 141 mmol/L (ref 135–145)
Total Bilirubin: 0.8 mg/dL (ref 0.3–1.2)
Total Protein: 6.3 g/dL — ABNORMAL LOW (ref 6.5–8.1)

## 2019-08-19 LAB — BRAIN NATRIURETIC PEPTIDE: B Natriuretic Peptide: 291 pg/mL — ABNORMAL HIGH (ref 0.0–100.0)

## 2019-08-19 LAB — PROCALCITONIN: Procalcitonin: <0.1 ng/mL

## 2019-08-19 LAB — MAGNESIUM: Magnesium: 2.4 mg/dL (ref 1.7–2.4)

## 2019-08-19 LAB — PHOSPHORUS: Phosphorus: 4.3 mg/dL (ref 2.5–4.6)

## 2019-08-19 MED ORDER — DILTIAZEM HCL 30 MG PO TABS
30.0000 mg | ORAL_TABLET | Freq: Four times a day (QID) | ORAL | Status: DC
  Administered 2019-08-19 – 2019-08-20 (×3): 30 mg via ORAL
  Filled 2019-08-19 (×3): qty 1

## 2019-08-19 MED ORDER — METHYLPREDNISOLONE SODIUM SUCC 40 MG IJ SOLR
40.0000 mg | Freq: Two times a day (BID) | INTRAMUSCULAR | Status: DC
  Administered 2019-08-20 (×2): 40 mg via INTRAVENOUS
  Filled 2019-08-19 (×2): qty 1

## 2019-08-19 NOTE — Consult Note (Signed)
CRITICAL CARE PROGRESS NOTE    Name: Ashley Molina MRN: [id number] DOB: 01-12-1961  Referring physician : Dr Janese Banks    LOS: 1   SUBJECTIVE FINDINGS & SIGNIFICANT EVENTS   Patient description:  [name] a58 y.o.Caucasian femalewith a known history of breast cancer with lung metastasis status post lumpectomy, chemotherapy and radiotherapy, COVID-19 in December 2020, dyslipidemia and anemia, who presented to [facility] worsening dyspnea with associated cough with inability to expectorate as well as wheezing today. The patient was just discharged from here on 3/10 after being treated for acute on chronic respiratory failure requiring BiPAP. No nausea or vomiting or abdominal pain. She denies any chest pain or palpitations. No fever or chills. No dysuria, oliguria or hematuria or flank pain.  Upon presentation to the emergency room, heart rate was 111 has gone up to 142 with otherwise normal vital signs. EKG showed atrial fibrillation/flutter with a rate of 165. Respiratory clear unless 25-28. Labs revealed potassium of 3.6 magnesium 2.2 anion gap of 15. BNP was 400. High-sensitivity troponin I was 38 and later 98 and CBC showed leukocytosis of 13.9. Influenza antigens and COVID-19 PCR came back negative. Chest x-ray showed findings representing pulmonary edema with pneumonia not excluded.  Lines / Drains: PIVx2  Cultures / Sepsis markers: Resp cx   Antibiotics: Rocephin/zithromax    Protocols / Consultants: Pulm/onc/hospitalist     PAST MEDICAL HISTORY   Past Medical History:  Diagnosis Date  . Anemia   . Breast cancer ([place])   . Breast cancer, right ([place]) 04/2017   Hx Lumpectomy, Chemo + Rad tx's.  . Breast cyst, right    aspirated by Dr. Bary Castilla  . COVID-19   .  Hyperlipidemia   . Personal history of chemotherapy   . Pre-diabetes      SURGICAL HISTORY   Past Surgical History:  Procedure Laterality Date  . AXILLARY LYMPH NODE BIOPSY Right 04/09/2017   Procedure: AXILLARY LYMPH NODE BIOPSY;  Surgeon: Robert Bellow, MD;  Location: [facility] ORS;  Service: General;  Laterality: Right;  . BREAST BIOPSY Right 03/27/2017   US guided breast mass - invasive mammary carcinoma  . BREAST BIOPSY Right 03/27/2017   Lymph node - metastatic carcinoma  . BREAST BIOPSY Right 04/09/2017   Lymph node   . BREAST CYST ASPIRATION Right 11/2009   Dr. Bary Castilla did FNA  . BREAST EXCISIONAL BIOPSY Right 09/28/2017   lumpectomy and 12 lympnode rad neo adj chemo  . COLONOSCOPY  2011  . DILATION AND CURETTAGE OF UTERUS     X3  . ENDOBRONCHIAL ULTRASOUND N/A 04/09/2017   Procedure: ENDOBRONCHIAL ULTRASOUND;  Surgeon: Laverle Hobby, MD;  Location: [facility] ORS;  Service: Pulmonary;  Laterality: N/A;  . ENDOMETRIAL ABLATION    . ENDOMETRIAL BIOPSY  09/2009  . PORTACATH PLACEMENT Left 04/09/2017   Procedure: INSERTION PORT-A-CATH;  Surgeon: Robert Bellow, MD;  Location: [facility] ORS;  Service: General;  Laterality: Left;     FAMILY HISTORY   Family History  Problem Relation Age of Onset  . Melanoma Maternal Grandmother 7       currently 74  . Brain cancer Maternal Grandfather 63       unk. type; deceased in 31s  . Melanoma Other 42       mat grandmother's father  . Melanoma Father 85       on head; currently 53  . Prostate cancer Maternal Uncle        3 maternal uncles; dx in  65s  . Breast cancer Other        mat grandfather's sister; dx 81s     SOCIAL HISTORY   Social History   Tobacco Use  . Smoking status: Never Smoker  . Smokeless tobacco: Never Used  Substance Use Topics  . Alcohol use: Not Currently  . Drug use: No     MEDICATIONS   Current Medication:  Current Facility-Administered Medications:  .  0.9 %  sodium chloride  infusion, 250 mL, Intravenous, PRN, Mansy, Jan A, MD .  acetaminophen (TYLENOL) tablet 650 mg, 650 mg, Oral, Q4H PRN, Mansy, Jan A, MD .  aspirin EC tablet 81 mg, 81 mg, Oral, Daily, Mansy, Jan A, MD, 81 mg at 08/19/19 0929 .  azithromycin (ZITHROMAX) 500 mg in sodium chloride 0.9 % 250 mL IVPB, 500 mg, Intravenous, Q24H, Mansy, Arvella Merles, MD, Stopped at 08/19/19 1037 .  budesonide (PULMICORT) nebulizer solution 0.5 mg, 0.5 mg, Nebulization, BID, Mansy, Jan A, MD, 0.5 mg at 08/19/19 0804 .  cefTRIAXone (ROCEPHIN) 2 g in sodium chloride 0.9 % 100 mL IVPB, 2 g, Intravenous, Q24H, Mansy, Jan A, MD, Last Rate: 200 mL/hr at 08/19/19 1102, Rate Verify at 08/19/19 1102 .  Chlorhexidine Gluconate Cloth 2 % PADS 6 each, 6 each, Topical, Daily, Elodia Florence., MD, 6 each at 08/18/19 1401 .  chlorpheniramine-HYDROcodone (TUSSIONEX) 10-8 MG/5ML suspension 5 mL, 5 mL, Oral, Q12H, Mansy, Jan A, MD, 5 mL at 08/19/19 0929 .  cholecalciferol (VITAMIN D3) tablet 1,000 Units, 1,000 Units, Oral, QHS, Mansy, Arvella Merles, MD, 1,000 Units at 08/18/19 2130 .  diltiazem (CARDIZEM) tablet 30 mg, 30 mg, Oral, Q6H, Teodoro Spray, MD, 30 mg at 08/19/19 1703 .  enoxaparin (LOVENOX) injection 40 mg, 40 mg, Subcutaneous, Q24H, Mansy, Jan A, MD, 40 mg at 08/19/19 0607 .  ferrous sulfate tablet 325 mg, 325 mg, Oral, QHS, Mansy, Jan A, MD, 325 mg at 08/18/19 2129 .  furosemide (LASIX) injection 40 mg, 40 mg, Intravenous, BID, Elodia Florence., MD, 40 mg at 08/19/19 1307 .  guaiFENesin-dextromethorphan (ROBITUSSIN DM) 100-10 MG/5ML syrup 15 mL, 15 mL, Oral, Q4H PRN, Mansy, Jan A, MD .  ipratropium (ATROVENT) nebulizer solution 0.5 mg, 0.5 mg, Nebulization, Q6H, Elodia Florence., MD, 0.5 mg at 08/19/19 1516 .  ipratropium-albuterol (DUONEB) 0.5-2.5 (3) MG/3ML nebulizer solution 3 mL, 3 mL, Nebulization, Once, Blake Divine, MD .  levalbuterol [facility]) nebulizer solution 0.63 mg, 0.63 mg, Nebulization, Q6H, Elodia Florence., MD, 0.63 mg at 08/19/19 1514 .  LORazepam (ATIVAN) tablet 0.5 mg, 0.5 mg, Oral, Q6H PRN, Borders, Vonna Kotyk R, NP, 0.5 mg at 08/19/19 1132 .  methylPREDNISolone sodium succinate (SOLU-MEDROL) 40 mg/mL injection 40 mg, 40 mg, Intravenous, Q8H, Mansy, Jan A, MD, 40 mg at 08/19/19 1306 .  metoprolol tartrate (LOPRESSOR) tablet 25 mg, 25 mg, Oral, Once, Blake Divine, MD, Stopped at 08/18/19 0424 .  morphine 2 MG/ML injection 1-2 mg, 1-2 mg, Intravenous, Q2H PRN, Borders, Kirt Boys, NP .  multivitamin with minerals tablet 1 tablet, 1 tablet, Oral, QHS, Mansy, Jan A, MD, 1 tablet at 08/18/19 2130 .  omega-3 acid ethyl esters (LOVAZA) capsule 1 g, 1 g, Oral, Daily, Mansy, Jan A, MD, 1 g at 08/19/19 0929 .  ondansetron (ZOFRAN) injection 4 mg, 4 mg, Intravenous, Q6H PRN, Mansy, Jan A, MD .  pantoprazole (PROTONIX) EC tablet 20 mg, 20 mg, Oral, Daily, Mansy, Jan A, MD, 20 mg at 08/19/19 0929 .  sodium chloride flush (NS) 0.9 % injection 3 mL, 3 mL, Intravenous, Q12H, Mansy, Jan A, MD, 3 mL at 08/18/19 2132 .  sodium chloride flush (NS) 0.9 % injection 3 mL, 3 mL, Intravenous, PRN, Mansy, Jan A, MD .  sulfamethoxazole-trimethoprim (BACTRIM DS) 800-160 MG per tablet 1 tablet, 1 tablet, Oral, Once per day on Mon Wed Fri, Mansy, Jan A, MD, 1 tablet at 08/18/19 3875    ALLERGIES   Penicillins    REVIEW OF SYSTEMS     10 point ROS done and is negative except as per subjective findings  PHYSICAL EXAMINATION   Vital Signs: Temp:  [97.9 F (36.6 C)-98.7 F (37.1 C)] 98.3 F (36.8 C) (03/16 1400) Pulse Rate:  [88-124] 106 (03/16 1800) Resp:  [17-30] 24 (03/16 1800) BP: (95-145)/(57-96) 109/69 (03/16 1800) SpO2:  [93 %-98 %] 96 % (03/16 1800) Weight:  [86.3 kg] 86.3 kg (03/16 0500)  GENERAL:mild distress due to hypoxemia HEAD: Normocephalic, atraumatic.  EYES: Pupils equal, round, reactive to light.  No scleral icterus.  MOUTH: Moist mucosal membrane. NECK: Supple. No  thyromegaly. No nodules. No JVD.  PULMONARY: mild rhonchorous breath sounds bilaterally  CARDIOVASCULAR: S1 and S2. Regular rate and rhythm. No murmurs, rubs, or gallops.  GASTROINTESTINAL: Soft, nontender, non-distended. No masses. Positive bowel sounds. No hepatosplenomegaly.  MUSCULOSKELETAL: No swelling, clubbing, or edema.  NEUROLOGIC: Mild distress due to acute illness SKIN:intact,warm,dry   PERTINENT DATA     Infusions: . sodium chloride    . azithromycin Stopped (08/19/19 1037)  . cefTRIAXone (ROCEPHIN)  IV 200 mL/hr at 08/19/19 1102   Scheduled Medications: . aspirin EC  81 mg Oral Daily  . budesonide  0.5 mg Nebulization BID  . Chlorhexidine Gluconate Cloth  6 each Topical Daily  . chlorpheniramine-HYDROcodone  5 mL Oral Q12H  . cholecalciferol  1,000 Units Oral QHS  . diltiazem  30 mg Oral Q6H  . enoxaparin (LOVENOX) injection  40 mg Subcutaneous Q24H  . ferrous sulfate  325 mg Oral QHS  . furosemide  40 mg Intravenous BID  . ipratropium  0.5 mg Nebulization Q6H  . ipratropium-albuterol  3 mL Nebulization Once  . levalbuterol  0.63 mg Nebulization Q6H  . methylPREDNISolone (SOLU-MEDROL) injection  40 mg Intravenous Q8H  . metoprolol tartrate  25 mg Oral Once  . multivitamin with minerals  1 tablet Oral QHS  . omega-3 acid ethyl esters  1 g Oral Daily  . pantoprazole  20 mg Oral Daily  . sodium chloride flush  3 mL Intravenous Q12H  . sulfamethoxazole-trimethoprim  1 tablet Oral Once per day on Mon Wed Fri   PRN Medications: sodium chloride, acetaminophen, guaiFENesin-dextromethorphan, LORazepam, morphine, ondansetron (ZOFRAN) IV, sodium chloride flush Hemodynamic parameters:   Intake/Output: 03/15 0701 - 03/16 0700 In: -  Out: 1400 [[id number]]  Ventilator  Settings:       LAB RESULTS:  Basic Metabolic Panel: Recent Labs  Lab 08/14/19 0517 08/14/19 0517 08/15/19 0639 08/15/19 0639 08/16/19 0601 08/16/19 0601 08/18/19 0045 08/19/19 0444  NA  140  --  136  --  136  --  139 141  K 4.0   < > 3.6   < > 4.0   < > 3.6 3.9  CL 103  --  99  --  97*  --  96* 96*  CO2 25  --  27  --  30  --  28 31  GLUCOSE 130*  --  129*  --  129*  --  131*  142*  BUN 20  --  32*  --  25*  --  30* 32*  CREATININE 0.78  --  0.89  --  0.74  --  0.74 0.81  CALCIUM 9.7  --  9.5  --  9.3  --  9.9 10.1  MG  --   --  2.1  --  2.2  --  2.2 2.4  PHOS  --   --   --   --  3.4  --   --  4.3   < > = values in this interval not displayed.   Liver Function Tests: Recent Labs  Lab 08/13/19 1502 08/18/19 0045 08/19/19 0444  AST _0 ALT 39 26 23  ALKPHOS 111 105 88  BILITOT 0.6 0.9 0.8  PROT 6.9 6.9 6.3*  ALBUMIN 3.9 4.0 3.4*   No results for input(s): LIPASE, AMYLASE in the last 168 hours. No results for input(s): AMMONIA in the last 168 hours. CBC: Recent Labs  Lab 08/13/19 1502 08/13/19 1502 08/14/19 0517 08/15/19 0639 08/16/19 0601 08/18/19 0004 08/19/19 0444  WBC 17.1*   < > 10.4 10.0 10.4 13.9* 14.7*  NEUTROABS 15.7*  --   --   --   --   --  13.3*  HGB 12.9   < > 12.6 11.7* 11.6* 13.5 12.1  HCT 41.4   < > 39.2 38.0 36.5 44.2 39.0  MCV 92.2   < > 90.3 92.0 91.5 94.2 91.5  PLT 307   < > 256 201 191 181 186   < > = values in this interval not displayed.   Cardiac Enzymes: No results for input(s): CKTOTAL, CKMB, CKMBINDEX, TROPONINI in the last 168 hours. BNP: Invalid input(s): POCBNP CBG: Recent Labs  Lab 08/13/19 2035 08/18/19 1039  GLUCAP 137* 128*     IMAGING RESULTS:  Imaging: DG Chest 1 View  Result Date: 08/18/2019 CLINICAL DATA:  59 year old female with shortness of breath. EXAM: CHEST  1 VIEW COMPARISON:  Chest radiograph dated 08/14/2019 and CT dated 08/06/2019 FINDINGS: Right hilar post treatment changes. There is diffuse interstitial prominence and lower lung field hazy densities likely representing edema. Pneumonia is not excluded. Clinical correlation is recommended. There is no pneumothorax. There is a small right  pleural effusion. Stable cardiac silhouette. Left pectoral Port-A-Cath with tip close to the cavoatrial junction. Atherosclerotic calcification of the aorta. No acute osseous pathology. IMPRESSION: Findings likely representing edema. Pneumonia is not excluded. Electronically Signed   By: Anner Crete M.D.   On: 08/18/2019 00:50      ASSESSMENT AND PLAN    -Multidisciplinary rounds held today  Acute Hypoxic Respiratory Failure -patient has airspace and interstitial opaification on CT chest 08/07/19 as above - recent re-hospitalization with increased O2 requirement -absence of PE on above CT PE -patient has been diuresed and is negative 2L -agree with CAP coverage - currently on Rocephin zithromax -patient has been on prolonged steroids - possible fungal etiology - ordered fungitell and aspergillus ab serum, may benefit from bronchoscopy with airway inspection and BAL -additionally possible metastatic lesions (although lung opacities are not typical of mets), possible gemcitabine pnemonitis although this generally does improve with steroids -have discussed case with Dr Janese Banks, additionally I will discuss with patients pulmonologist Dr Patsey Berthold -wean supplemental O2 as able  -RT for chest physiotherapy - will start [place] today  ID -continue IV abx as prescibed -follow up cultures  GI/Nutrition GI PROPHYLAXIS as indicated DIET-->TF's as tolerated Constipation protocol as indicated  ENDO - ICU hypoglycemic\Hyperglycemia protocol -check FSBS per protocol   ELECTROLYTES -follow labs as needed -replace as needed -pharmacy consultation   DVT/GI PRX ordered -SCDs  TRANSFUSIONS AS NEEDED MONITOR FSBS ASSESS the need for LABS as needed   Critical care provider statement:    Critical care time (minutes):  33   Critical care time was exclusive of:  Separately billable procedures and treating other patients   Critical care was necessary to treat or prevent imminent or  life-threatening deterioration of the following conditions:  acute hypoxemic respiratory failure, breast ca, multiple comorbid conditions   Critical care was time spent personally by me on the following activities:  Development of treatment plan with patient or surrogate, discussions with consultants, evaluation of patient's response to treatment, examination of patient, obtaining history from patient or surrogate, ordering and performing treatments and interventions, ordering and review of laboratory studies and re-evaluation of patient's condition.  I assumed direction of critical care for this patient from another provider in my specialty: no    This document was prepared using Dragon voice recognition software and may include unintentional dictation errors.    Ottie Glazier, M.D.  Division of [place]

## 2019-08-19 NOTE — Progress Notes (Signed)
[facility] was empty of 0.63 xopenex this am and was not able to give scheduled 0800 dose, patient was sob and requesting neb, 0800 dose given now.

## 2019-08-19 NOTE — Progress Notes (Signed)
PROGRESS NOTE    [name]  MHD:[id number] DOB: [birth year] DOA: 08/17/2019 PCP: Patient, No Pcp Per   Brief Narrative:  Ashley Molina  is a 59 y.o. Caucasian female with a known history of breast cancer with lung metastasis status post lumpectomy, chemotherapy and radiotherapy, COVID-19 in December 2020, dyslipidemia and anemia, who presented to [facility] worsening dyspnea with associated cough with inability to expectorate as well as wheezing today.  The patient was just discharged from here on 3/10 after being treated for acute on chronic respiratory failure requiring BiPAP.  No nausea or vomiting or abdominal pain.  She denies any chest pain or palpitations.  No fever or chills.  No dysuria, oliguria or hematuria or flank pain.  Upon presentation to the emergency room, heart rate was 111 has gone up to 142 with otherwise normal vital signs.  EKG showed atrial fibrillation/flutter with a rate of 165.  Respiratory clear unless 25-28.  Labs revealed potassium of 3.6 magnesium 2.2 anion gap of 15.  BNP was 400.  High-sensitivity troponin I was 38 and later 98 and CBC showed leukocytosis of 13.9.  Influenza antigens and COVID-19 PCR came back negative.  Chest x-ray showed findings representing pulmonary edema with pneumonia not excluded.  She's currently admitted for COPD exacerbation and is being treated with lasix for pulmonary edema/heart failure as well.   Assessment & Plan:   Active Problems:   COPD exacerbation ([place])   Palliative care encounter  1.  Acute Hypoxic Respiratory Failure  COPD acute exacerbation with subsequent acute cor pulmonale with mild interstitial pulmonary edema. - CXR 3/15 with edema - Steroids, scheduled and prn nebs, pulmicort - IV lasix  -She will be placed on scheduled and as needed duo nebs. 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  Hx of covid and radiation induced pneumonitis as well.  Multiple possible contributing factors.  Will c/s pulm to follow.  2. Sinus Tachycardia with PAC vs Fib/Flutter  Elevated Troponin - some question of possible afib/flutter, though suspect this is sinus tach with PAC's - will consult cardiology with elevated troponin and tachyarrythmia, suspect this is demand ischemia - recent echo with normal EF -continue metop  3.  GERD. -PPI therapy will be resumed.  4.  Hypertension. -Beta-blocker therapy will be resumed.  5.  DVT prophylaxis. -Subcutaneous Lovenox  DVT prophylaxis: lovenox Code Status: full Family Communication: none at bedside Disposition Plan:  . Patient came from: home            . Anticipated d/c place: home . Barriers to d/c OR conditions which need to be met to effect a safe d/c: pending improvement in resp status  Consultants:   oncology  Procedures:   none  Antimicrobials:  Anti-infectives (From admission, onward)   Start     Dose/Rate Route Frequency Ordered Stop   08/18/19 0900  sulfamethoxazole-trimethoprim (BACTRIM DS) 800-160 MG per tablet 1 tablet     1 tablet Oral Once per day on Mon Wed Fri 08/18/19 0622     08/18/19 0700  cefTRIAXone (ROCEPHIN) 2 g in sodium chloride 0.9 % 100 mL IVPB     2 g 200 mL/hr over 30 Minutes Intravenous Every 24 hours 08/18/19 0647     08/18/19 0700  azithromycin (ZITHROMAX) 500 mg in sodium chloride 0.9 % 250 mL IVPB     500 mg  250 mL/hr over 60 Minutes Intravenous Every 24 hours 08/18/19 0647       Subjective: SOB improved today  Objective: Vitals:   08/19/19 1000 08/19/19 1100 08/19/19 1140 08/19/19 1200  BP: 107/64 (!) 145/87  104/75  Pulse: (!) 111 (!) 124  (!) 111  Resp: 20 18  (!) 24  Temp:      TempSrc:      SpO2: 96% 94% 93% 95%  Weight:      Height:        Intake/Output Summary (Last 24 hours) at 08/19/2019 1238 Last data filed  at 08/19/2019 1102 Gross per 24 hour  Intake 830.28 ml  Output 2000 ml  Net -1169.72 ml   Filed Weights   08/17/19 2352 08/18/19 1053 08/19/19 0500  Weight: 90 kg 86 kg 86.3 kg    Examination:  General: No acute distress. Cardiovascular: Heart sounds show Shavana Calder tachycardic rate Lungs: wheezing and coarse breath sounds, scattered Abdomen: Soft, nontender, nondistended Neurological: Alert and oriented 3. Moves all extremities 4. Cranial nerves II through XII grossly intact. Skin: Warm and dry. No rashes or lesions. Extremities: No clubbing or cyanosis. Trace edema      Data Reviewed: I have personally reviewed following labs and imaging studies  CBC: Recent Labs  Lab 08/13/19 1502 08/13/19 1502 08/14/19 0517 08/15/19 0639 08/16/19 0601 08/18/19 0004 08/19/19 0444  WBC 17.1*   < > 10.4 10.0 10.4 13.9* 14.7*  NEUTROABS 15.7*  --   --   --   --   --  13.3*  HGB 12.9   < > 12.6 11.7* 11.6* 13.5 12.1  HCT 41.4   < > 39.2 38.0 36.5 44.2 39.0  MCV 92.2   < > 90.3 92.0 91.5 94.2 91.5  PLT 307   < > 256 201 191 181 186   < > = values in this interval not displayed.   Basic Metabolic Panel: Recent Labs  Lab 08/14/19 0517 08/15/19 0639 08/16/19 0601 08/18/19 0045 08/19/19 0444  NA 140 136 136 139 141  K 4.0 3.6 4.0 3.6 3.9  CL 103 99 97* 96* 96*  CO2 _0 GLUCOSE 130* 129* 129* 131* 142*  BUN 20 32* 25* 30* 32*  CREATININE 0.78 0.89 0.74 0.74 0.81  CALCIUM 9.7 9.5 9.3 9.9 10.1  MG  --  2.1 2.2 2.2 2.4  PHOS  --   --  3.4  --  4.3   GFR: Estimated Creatinine Clearance: 77 mL/min (by C-G formula based on SCr of 0.81 mg/dL). Liver Function Tests: Recent Labs  Lab 08/13/19 1502 08/18/19 0045 08/19/19 0444  AST _1 ALT 39 26 23  ALKPHOS 111 105 88  BILITOT 0.6 0.9 0.8  PROT 6.9 6.9 6.3*  ALBUMIN 3.9 4.0 3.4*   No results for input(s): LIPASE, AMYLASE in the last 168 hours. No results for input(s): AMMONIA in the last 168 hours. Coagulation  Profile: No results for input(s): INR, PROTIME in the last 168 hours. Cardiac Enzymes: No results for input(s): CKTOTAL, CKMB, CKMBINDEX, TROPONINI in the last 168 hours. BNP (last 3 results) No results for input(s): PROBNP in the last 8760 hours. HbA1C: No results for input(s): HGBA1C in the last 72 hours. CBG: Recent Labs  Lab 08/13/19 2035 08/18/19 1039  GLUCAP 137* 128*   Lipid Profile: No results for input(s): CHOL, HDL, LDLCALC, TRIG, CHOLHDL, LDLDIRECT in the last 72 hours. Thyroid Function Tests: No results for input(s): TSH, T4TOTAL,  FREET4, T3FREE, THYROIDAB in the last 72 hours. Anemia Panel: No results for input(s): VITAMINB12, FOLATE, FERRITIN, TIBC, IRON, RETICCTPCT in the last 72 hours. Sepsis Labs: Recent Labs  Lab 08/13/19 1502 08/13/19 2112 08/14/19 0739 08/14/19 0811  PROCALCITON  --  <0.10  --   --   LATICACIDVEN 2.1* 2.0* 1.4 1.5    Recent Results (from the past 240 hour(s))  Blood Culture (routine x 2)     Status: None   Collection Time: 08/13/19  3:02 PM   Specimen: BLOOD  Result Value Ref Range Status   Specimen Description BLOOD BLOOD LEFT HAND  Final   Special Requests   Final    BOTTLES DRAWN AEROBIC AND ANAEROBIC Blood Culture adequate volume   Culture   Final    NO GROWTH 5 DAYS Performed at [facility], [address] [place], [place] [address]    Report Status 08/18/2019 FINAL  Final  Blood Culture (routine x 2)     Status: None   Collection Time: 08/13/19  3:07 PM   Specimen: BLOOD  Result Value Ref Range Status   Specimen Description BLOOD LEFT ANTECUBITAL  Final   Special Requests   Final    BOTTLES DRAWN AEROBIC AND ANAEROBIC Blood Culture adequate volume   Culture   Final    NO GROWTH 5 DAYS Performed at [facility], [address], [place], [place] [address]    Report Status 08/18/2019 FINAL  Final  SARS CORONAVIRUS 2 (TAT 6-24 HRS) Nasopharyngeal Nasopharyngeal Swab     Status: None   Collection  Time: 08/13/19  8:16 PM   Specimen: Nasopharyngeal Swab  Result Value Ref Range Status   SARS Coronavirus 2 NEGATIVE NEGATIVE Final    Comment: (NOTE) SARS-CoV-2 target nucleic acids are NOT DETECTED. The SARS-CoV-2 RNA is generally detectable in upper and lower respiratory specimens during the acute phase of infection. Negative results do not preclude SARS-CoV-2 infection, do not rule out co-infections with other pathogens, and should not be used as the sole basis for treatment or other patient management decisions. Negative results must be combined with clinical observations, patient history, and epidemiological information. The expected result is Negative. Fact Sheet for Patients: SugarRoll.be Fact Sheet for Healthcare Providers: https://www.woods-mathews.com/ This test is not yet approved or cleared by the [place] FDA and  has been authorized for detection and/or diagnosis of SARS-CoV-2 by FDA under an Emergency Use Authorization (EUA). This EUA will remain  in effect (meaning this test can be used) for the duration of the COVID-19 declaration under Section 56 4(b)(1) of the Act, 21 U.S.C. section 360bbb-3(b)(1), unless the authorization is terminated or revoked sooner. Performed at [facility], [place] [address] [place], [place] [address]   MRSA PCR Screening     Status: None   Collection Time: 08/13/19 11:28 PM   Specimen: Nasopharyngeal  Result Value Ref Range Status   MRSA by PCR NEGATIVE NEGATIVE Final    Comment:        The GeneXpert MRSA Assay (FDA approved for NASAL specimens only), is one component of Aashna Matson comprehensive MRSA colonization surveillance program. It is not intended to diagnose MRSA infection nor to guide or monitor treatment for MRSA infections. Performed at [facility], [address] [place], [place] [address]   Respiratory Panel by RT PCR (Flu Marasia Newhall&B, Covid) - Nasopharyngeal Swab      Status: None   Collection Time: 08/18/19  2:23 AM   Specimen: Nasopharyngeal Swab  Result Value Ref Range Status   SARS Coronavirus 2 by RT PCR NEGATIVE NEGATIVE Final    Comment: (NOTE) SARS-CoV-2 target nucleic acids are NOT DETECTED. The SARS-CoV-2 RNA is generally detectable in upper respiratoy specimens during the acute phase of infection. The lowest concentration of SARS-CoV-2 viral copies this assay can detect is 131 copies/mL. A negative result does not preclude SARS-Cov-2 infection and should not be used as the sole basis for treatment or other patient management decisions. A negative result may occur with  improper specimen collection/handling, submission of specimen other than nasopharyngeal swab, presence of viral mutation(s) within the areas targeted by this assay, and inadequate number of viral copies (<131 copies/mL). A negative result must be combined with clinical observations, patient history, and epidemiological information. The expected result is Negative. Fact Sheet for Patients:  PinkCheek.be Fact Sheet for Healthcare Providers:  GravelBags.it This test is not yet ap proved or cleared by the [place] FDA and  has been authorized for detection and/or diagnosis of SARS-CoV-2 by FDA under an Emergency Use Authorization (EUA). This EUA will remain  in effect (meaning this test can be used) for the duration of the COVID-19 declaration under Section 564(b)(1) of the Act, 21 U.S.C. section 360bbb-3(b)(1), unless the authorization is terminated or revoked sooner.    Influenza A by PCR NEGATIVE NEGATIVE Final   Influenza B by PCR NEGATIVE NEGATIVE Final    Comment: (NOTE) The Xpert Xpress SARS-CoV-2/FLU/RSV assay is intended as an aid in  the diagnosis of influenza from Nasopharyngeal swab specimens and  should not be used as a sole basis for treatment. Nasal washings and  aspirates are unacceptable for  Xpert Xpress SARS-CoV-2/FLU/RSV  testing. Fact Sheet for Patients: PinkCheek.be Fact Sheet for Healthcare Providers: GravelBags.it This test is not yet approved or cleared by the [place] FDA and  has been authorized for detection and/or diagnosis of SARS-CoV-2 by  FDA under an Emergency Use Authorization (EUA). This EUA will remain  in effect (meaning this test can be used) for the duration of the  Covid-19 declaration under Section 564(b)(1) of the Act, 21  U.S.C. section 360bbb-3(b)(1), unless the authorization is  terminated or revoked. Performed at [facility], [address] [place], [place] [address]   CULTURE, BLOOD (ROUTINE X 2) w Reflex to ID Panel     Status: None (Preliminary result)   Collection Time: 08/18/19  8:19 AM   Specimen: BLOOD  Result Value Ref Range Status   Specimen Description BLOOD LEFT HAND   Final   Special Requests   Final    BOTTLES DRAWN AEROBIC AND ANAEROBIC Blood Culture adequate volume   Culture   Final    NO GROWTH < 24 HOURS Performed at [facility], [address], [place], [place] [address]    Report Status PENDING  Incomplete  CULTURE, BLOOD (ROUTINE X 2) w Reflex to ID Panel     Status: None (Preliminary result)   Collection Time: 08/18/19  8:24 AM   Specimen: BLOOD  Result Value Ref Range Status   Specimen Description BLOOD LEFT WRIST  Final   Special Requests   Final    BOTTLES DRAWN AEROBIC AND ANAEROBIC Blood Culture adequate volume   Culture   Final    NO GROWTH < 24 HOURS Performed at [facility], [address] [place], [place] [address]    Report Status PENDING  Incomplete         Radiology Studies: DG Chest 1  View  Result Date: 08/18/2019 CLINICAL DATA:  59 year old female with shortness of breath. EXAM: CHEST  1 VIEW COMPARISON:  Chest radiograph dated 08/14/2019 and CT dated 08/06/2019 FINDINGS: Right hilar post treatment  changes. There is diffuse interstitial prominence and lower lung field hazy densities likely representing edema. Pneumonia is not excluded. Clinical correlation is recommended. There is no pneumothorax. There is Joshlyn Beadle small right pleural effusion. Stable cardiac silhouette. Left pectoral [place] Gustafson-Cath with tip close to the cavoatrial junction. Atherosclerotic calcification of the aorta. No acute osseous pathology. IMPRESSION: Findings likely representing edema. Pneumonia is not excluded. Electronically Signed   By: Anner Crete M.D.   On: 08/18/2019 00:50        Scheduled Meds: . aspirin EC  81 mg Oral Daily  . budesonide  0.5 mg Nebulization BID  . Chlorhexidine Gluconate Cloth  6 each Topical Daily  . chlorpheniramine-HYDROcodone  5 mL Oral Q12H  . cholecalciferol  1,000 Units Oral QHS  . enoxaparin (LOVENOX) injection  40 mg Subcutaneous Q24H  . ferrous sulfate  325 mg Oral QHS  . furosemide  40 mg Intravenous BID  . ipratropium  0.5 mg Nebulization Q6H  . ipratropium-albuterol  3 mL Nebulization Once  . levalbuterol  0.63 mg Nebulization Q6H  . methylPREDNISolone (SOLU-MEDROL) injection  40 mg Intravenous Q8H  . metoprolol tartrate  25 mg Oral Once  . metoprolol tartrate  25 mg Oral BID  . multivitamin with minerals  1 tablet Oral QHS  . omega-3 acid ethyl esters  1 g Oral Daily  . pantoprazole  20 mg Oral Daily  . sodium chloride flush  3 mL Intravenous Q12H  . sulfamethoxazole-trimethoprim  1 tablet Oral Once per day on Mon Wed Fri   Continuous Infusions: . sodium chloride    . azithromycin Stopped (08/19/19 1037)  . cefTRIAXone (ROCEPHIN)  IV 200 mL/hr at 08/19/19 1102     LOS: 1 day    Time spent: over 30 min    Fayrene Helper, MD [facility]   To contact the attending provider between 7A-7P or the covering provider during after hours 7P-7A, please log into the web site [email or web address] and access using universal [place] password for that web site. If you  do not have the password, please call the hospital operator.  08/19/2019, 12:38 PM

## 2019-08-19 NOTE — Progress Notes (Signed)
Hematology/Oncology Consult note [facility]  [phone] [phone]  Patient Care Team: Patient, No Pcp Per as PCP - General (General Practice) Byrnett, Forest Gleason, MD (General Surgery) Gae Dry, MD as Referring Physician (Obstetrics and Gynecology)   Name of the patient: Ashley Molina  [id number]  August 02, 1960   Date of visit: 08/19/19   Interval history- feels a little better since yesterday but still short of breath. On 5L of O2 at rest   Review of systems- Review of Systems  Constitutional: Positive for malaise/fatigue. Negative for chills, fever and weight loss.  HENT: Negative for congestion, ear discharge and nosebleeds.   Eyes: Negative for blurred vision.  Respiratory: Positive for shortness of breath. Negative for cough, hemoptysis, sputum production and wheezing.   Cardiovascular: Negative for chest pain, palpitations, orthopnea and claudication.  Gastrointestinal: Negative for abdominal pain, blood in stool, constipation, diarrhea, heartburn, melena, nausea and vomiting.  Genitourinary: Negative for dysuria, flank pain, frequency, hematuria and urgency.  Musculoskeletal: Negative for back pain, joint pain and myalgias.  Skin: Negative for rash.  Neurological: Negative for dizziness, tingling, focal weakness, seizures, weakness and headaches.  Endo/Heme/Allergies: Does not bruise/bleed easily.  Psychiatric/Behavioral: Negative for depression and suicidal ideas. The patient does not have insomnia.        Allergies  Allergen Reactions   Penicillins Rash    Did it involve swelling of the face/tongue/throat, SOB, or low BP? No Did it involve sudden or severe rash/hives, skin peeling, or any reaction on the inside of your mouth or nose? Yes Did you need to seek medical attention at a hospital or doctor's office? No When did it last happen? If all above answers are NO, may proceed with cephalosporin use.       Past Medical History:  Diagnosis Date   Anemia    Breast cancer ([place])    Breast cancer, right ([place]) 04/2017   Hx Lumpectomy, Chemo + Rad tx's.   Breast cyst, right    aspirated by Dr. Bary Castilla   COVID-19    Hyperlipidemia    Personal history of chemotherapy    Pre-diabetes      Past Surgical History:  Procedure Laterality Date   AXILLARY LYMPH NODE BIOPSY Right 04/09/2017   Procedure: AXILLARY LYMPH NODE BIOPSY;  Surgeon: Robert Bellow, MD;  Location: [facility] ORS;  Service: General;  Laterality: Right;   BREAST BIOPSY Right 03/27/2017   US guided breast mass - invasive mammary carcinoma   BREAST BIOPSY Right 03/27/2017   Lymph node - metastatic carcinoma   BREAST BIOPSY Right 04/09/2017   Lymph node    BREAST CYST ASPIRATION Right 11/2009   Dr. Bary Castilla did FNA   BREAST EXCISIONAL BIOPSY Right 09/28/2017   lumpectomy and 12 lympnode rad neo adj chemo   COLONOSCOPY  2011   DILATION AND CURETTAGE OF UTERUS     X3   ENDOBRONCHIAL ULTRASOUND N/A 04/09/2017   Procedure: ENDOBRONCHIAL ULTRASOUND;  Surgeon: Laverle Hobby, MD;  Location: [facility] ORS;  Service: Pulmonary;  Laterality: N/A;   ENDOMETRIAL ABLATION     ENDOMETRIAL BIOPSY  09/2009   PORTACATH PLACEMENT Left 04/09/2017   Procedure: INSERTION PORT-A-CATH;  Surgeon: Robert Bellow, MD;  Location: [facility] ORS;  Service: General;  Laterality: Left;    Social History   Socioeconomic History   Marital status: Married    Spouse name: Not on file   Number of children: Not on file   Years of education: Not on  file   Highest education level: Not on file  Occupational History   Not on file  Tobacco Use   Smoking status: Never Smoker   Smokeless tobacco: Never Used  Substance and Sexual Activity   Alcohol use: Not Currently   Drug use: No   Sexual activity: Not Currently  Other Topics Concern   Not on file  Social History Narrative   Not on file   Social Determinants of  Health   Financial Resource Strain:    Difficulty of Paying Living Expenses:   Food Insecurity:    Worried About Charity fundraiser in the Last Year:    Arboriculturist in the Last Year:   Transportation Needs:    Film/video editor (Medical):    Lack of Transportation (Non-Medical):   Physical Activity:    Days of Exercise per Week:    Minutes of Exercise per Session:   Stress:    Feeling of Stress :   Social Connections:    Frequency of Communication with Friends and Family:    Frequency of Social Gatherings with Friends and Family:    Attends Religious Services:    Active Member of Clubs or Organizations:    Attends Music therapist:    Marital Status:   Intimate Partner Violence:    Fear of Current or Ex-Partner:    Emotionally Abused:    Physically Abused:    Sexually Abused:     Family History  Problem Relation Age of Onset   Melanoma Maternal Grandmother 98       currently 60   Brain cancer Maternal Grandfather 50       unk. type; deceased in 73s   Melanoma Other 65       mat grandmother's father   Melanoma Father 76       on head; currently 59   Prostate cancer Maternal Uncle        3 maternal uncles; dx in 66s   Breast cancer Other        mat grandfather's sister; dx 33s     Current Facility-Administered Medications:    0.9 %  sodium chloride infusion, 250 mL, Intravenous, PRN, Mansy, Jan A, MD   acetaminophen (TYLENOL) tablet 650 mg, 650 mg, Oral, Q4H PRN, Mansy, Jan A, MD   aspirin EC tablet 81 mg, 81 mg, Oral, Daily, Mansy, Jan A, MD, 81 mg at 08/19/19 [id number]   azithromycin (ZITHROMAX) 500 mg in sodium chloride 0.9 % 250 mL IVPB, 500 mg, Intravenous, Q24H, Mansy, Jan A, MD, Stopped at 08/19/19 1037   budesonide (PULMICORT) nebulizer solution 0.5 mg, 0.5 mg, Nebulization, BID, Mansy, Jan A, MD, 0.5 mg at 08/19/19 0804   cefTRIAXone (ROCEPHIN) 2 g in sodium chloride 0.9 % 100 mL IVPB, 2 g, Intravenous, Q24H,  Mansy, Jan A, MD, Last Rate: 200 mL/hr at 08/19/19 1102, Rate Verify at 08/19/19 1102   Chlorhexidine Gluconate Cloth 2 % PADS 6 each, 6 each, Topical, Daily, Elodia Florence., MD, 6 each at 08/18/19 1401   chlorpheniramine-HYDROcodone (TUSSIONEX) 10-8 MG/5ML suspension 5 mL, 5 mL, Oral, Q12H, Mansy, Jan A, MD, 5 mL at 08/19/19 [id number]   cholecalciferol (VITAMIN D3) tablet 1,000 Units, 1,000 Units, Oral, QHS, Mansy, Jan A, MD, 1,000 Units at 08/18/19 2130   diltiazem (CARDIZEM) tablet 30 mg, 30 mg, Oral, Q6H, Fath, Javier Docker, MD   enoxaparin (LOVENOX) injection 40 mg, 40 mg, Subcutaneous, Q24H, Mansy, Jan A, MD, 40 mg at  08/19/19 0607   ferrous sulfate tablet 325 mg, 325 mg, Oral, QHS, Mansy, Jan A, MD, 325 mg at 08/18/19 2129   furosemide (LASIX) injection 40 mg, 40 mg, Intravenous, BID, Elodia Florence., MD, 40 mg at 08/19/19 1307   guaiFENesin-dextromethorphan (ROBITUSSIN DM) 100-10 MG/5ML syrup 15 mL, 15 mL, Oral, Q4H PRN, Mansy, Jan A, MD   ipratropium (ATROVENT) nebulizer solution 0.5 mg, 0.5 mg, Nebulization, Q6H, Elodia Florence., MD, 0.5 mg at 08/19/19 1516   ipratropium-albuterol (DUONEB) 0.5-2.5 (3) MG/3ML nebulizer solution 3 mL, 3 mL, Nebulization, Once, Blake Divine, MD   levalbuterol [facility]) nebulizer solution 0.63 mg, 0.63 mg, Nebulization, Q6H, Elodia Florence., MD, 0.63 mg at 08/19/19 1514   LORazepam (ATIVAN) tablet 0.5 mg, 0.5 mg, Oral, Q6H PRN, Borders, Vonna Kotyk R, NP, 0.5 mg at 08/19/19 1132   methylPREDNISolone sodium succinate (SOLU-MEDROL) 40 mg/mL injection 40 mg, 40 mg, Intravenous, Q8H, Mansy, Jan A, MD, 40 mg at 08/19/19 1306   metoprolol tartrate (LOPRESSOR) tablet 25 mg, 25 mg, Oral, Once, Blake Divine, MD, Stopped at 08/18/19 0424   morphine 2 MG/ML injection 1-2 mg, 1-2 mg, Intravenous, Q2H PRN, Borders, Kirt Boys, NP   multivitamin with minerals tablet 1 tablet, 1 tablet, Oral, QHS, Mansy, Jan A, MD, 1 tablet at 08/18/19  2130   omega-3 acid ethyl esters (LOVAZA) capsule 1 g, 1 g, Oral, Daily, Mansy, Jan A, MD, 1 g at 08/19/19 0929   ondansetron (ZOFRAN) injection 4 mg, 4 mg, Intravenous, Q6H PRN, Mansy, Jan A, MD   pantoprazole (PROTONIX) EC tablet 20 mg, 20 mg, Oral, Daily, Mansy, Jan A, MD, 20 mg at 08/19/19 [id number]   sodium chloride flush (NS) 0.9 % injection 3 mL, 3 mL, Intravenous, Q12H, Mansy, Jan A, MD, 3 mL at 08/18/19 2132   sodium chloride flush (NS) 0.9 % injection 3 mL, 3 mL, Intravenous, PRN, Mansy, Jan A, MD   sulfamethoxazole-trimethoprim (BACTRIM DS) 800-160 MG per tablet 1 tablet, 1 tablet, Oral, Once per day on Mon Wed Fri, Mansy, Jan A, MD, 1 tablet at 08/18/19 [id number]  Physical exam:  Vitals:   08/19/19 1200 08/19/19 1300 08/19/19 1400 08/19/19 1500  BP: 104/75 112/73 122/78   Pulse: (!) 111 (!) 104 99   Resp: (!) 24 19 19    Temp:   98.3 F (36.8 C)   TempSrc:   Oral   SpO2: 95% 96% 95% 98%  Weight:      Height:       Physical Exam Constitutional:      Comments: On 5L O2  HENT:     Head: Normocephalic and atraumatic.  Eyes:     Pupils: Pupils are equal, round, and reactive to light.  Cardiovascular:     Rate and Rhythm: Regular rhythm. Tachycardia present.     Heart sounds: Normal heart sounds.  Pulmonary:     Comments: Effort increased. Scattered b/l rhonchi Abdominal:     General: Bowel sounds are normal.     Palpations: Abdomen is soft.  Musculoskeletal:     Cervical back: Normal range of motion.  Skin:    General: Skin is warm and dry.  Neurological:     Mental Status: She is alert and oriented to person, place, and time.      CMP Latest Ref Rng & Units 08/19/2019  Glucose 70 - 99 mg/dL 142(H)  BUN 6 - 20 mg/dL 32(H)  Creatinine 0.44 - 1.00 mg/dL 0.81  Sodium 135 - 145 mmol/L  141  Potassium 3.5 - 5.1 mmol/L 3.9  Chloride 98 - 111 mmol/L 96(L)  CO2 22 - 32 mmol/L 31  Calcium 8.9 - 10.3 mg/dL 10.1  Total Protein 6.5 - 8.1 g/dL 6.3(L)  Total Bilirubin 0.3 -  1.2 mg/dL 0.8  Alkaline Phos 38 - 126 U/L 88  AST 15 - 41 U/L 20  ALT 0 - 44 U/L 23   CBC Latest Ref Rng & Units 08/19/2019  WBC 4.0 - 10.5 K/uL 14.7(H)  Hemoglobin 12.0 - 15.0 g/dL 12.1  Hematocrit 36.0 - 46.0 % 39.0  Platelets 150 - 400 K/uL 186    @IMAGES @  DG Chest 1 View  Result Date: 08/18/2019 CLINICAL DATA:  59 year old female with shortness of breath. EXAM: CHEST  1 VIEW COMPARISON:  Chest radiograph dated 08/14/2019 and CT dated 08/06/2019 FINDINGS: Right hilar post treatment changes. There is diffuse interstitial prominence and lower lung field hazy densities likely representing edema. Pneumonia is not excluded. Clinical correlation is recommended. There is no pneumothorax. There is a small right pleural effusion. Stable cardiac silhouette. Left pectoral Port-A-Cath with tip close to the cavoatrial junction. Atherosclerotic calcification of the aorta. No acute osseous pathology. IMPRESSION: Findings likely representing edema. Pneumonia is not excluded. Electronically Signed   By: Anner Crete M.D.   On: 08/18/2019 00:50   CT Angio Chest PE W and/or Wo Contrast  Result Date: 08/06/2019 CLINICAL DATA:  Hemoptysis, history of breast cancer and COVID EXAM: CT ANGIOGRAPHY CHEST WITH CONTRAST TECHNIQUE: Multidetector CT imaging of the chest was performed using the standard protocol during bolus administration of intravenous contrast. Multiplanar CT image reconstructions and MIPs were obtained to evaluate the vascular anatomy. CONTRAST:  46mL OMNIPAQUE IOHEXOL 350 MG/ML SOLN COMPARISON:  July 09, 2019 FINDINGS: Cardiovascular: There is a optimal opacification of the pulmonary arteries. There is no central,segmental, or subsegmental filling defects within the pulmonary arteries. A left-sided MediPort catheter is seen with the tip in the right atrium. The heart is normal in size. No pericardial effusion or thickening. No evidence right heart strain. There is normal three-vessel  brachiocephalic anatomy without proximal stenosis. Scattered mild aortic atherosclerosis. Mediastinum/Nodes: No hilar, mediastinal, or axillary adenopathy. Thyroid gland, trachea, and esophagus demonstrate no significant findings. Lungs/Pleura: There is new/worsening multifocal patchy predominantly peripherally based ground-glass opacities seen throughout both lungs. There is also slight interval worsening in the tree-in-bud nodular hazy opacities within the left lung apex. There is unchanged spiculated masslike consolidation within the right infrahilar region, likely from post treatment changes/fibrosis. There is a trace right pleural effusion present. Upper Abdomen: No acute abnormalities present in the visualized portions of the upper abdomen. Musculoskeletal: No chest wall abnormality. No acute or significant osseous findings. Again noted is diffuse skin thickening seen along the right breast. There is a surgical clips within the right breast. Review of the MIP images confirms the above findings. IMPRESSION: 1. No central, segmental, or subsegmental pulmonary embolism. 2. Interval worsening of the multifocal patchy ground-glass opacities throughout both lungs, consistent with multifocal pneumonia. 3. Interval slight worsening in the tree-in-bud nodular opacities within the left upper lobe, likely due to atypical infectious etiology, however cannot exclude underlying metastatic disease. 4. Post treatment radiation/fibrotic changes in the right infrahilar region. 5. Trace right pleural effusion Electronically Signed   By: Prudencio Pair M.D.   On: 08/06/2019 00:17   DG Chest Port 1 View  Result Date: 08/14/2019 CLINICAL DATA:  Acute respiratory failure. EXAM: PORTABLE CHEST 1 VIEW COMPARISON:  Chest x-ray 08/13/2019  FINDINGS: The left subclavian power port is stable. Stable radiation changes involving the right hilum and right paramediastinal lung. Persistent patchy nodular infiltrates and small right effusion.  No pneumothorax. IMPRESSION: Stable chest x-ray. Persistent patchy nodular infiltrates and small right effusion. Electronically Signed   By: Marijo Sanes M.D.   On: 08/14/2019 06:19   DG Chest Port 1 View  Result Date: 08/13/2019 CLINICAL DATA:  Shortness of breath, tachypnea with decreased oxygen saturations., history of breast cancer. EXAM: PORTABLE CHEST 1 VIEW COMPARISON:  08/06/2019, CT of the chest. FINDINGS: Masslike appearance of right hilum similar to previous exam. Cardiomediastinal contours are stable. Left Port-A-Cath terminates at the caval to atrial junction. Nodular opacities are again suggested and there is increase in interstitial prominence when compared to the chest x-ray from 08/05/2019. Blunting of right costophrenic angles suggest scarring or small effusion. No signs of dense consolidation aside from perihilar changes discussed above. Visualized skeletal structures without acute bone finding. IMPRESSION: Masslike appearance of right hilum similar to previous exam. Nodular opacities are again suggested and there is increase in interstitial prominence when compared to the chest x-ray from 08/05/2019. Findings may represent worsening of pulmonary parenchymal disease, associated with previously reported COVID-19 infection, particularly in the left lung base, potentially with a background of pulmonary edema. Electronically Signed   By: Zetta Bills M.D.   On: 08/13/2019 15:26   DG Chest Portable 1 View  Result Date: 08/05/2019 CLINICAL DATA:  59 year old female with hemoptysis. History of breast cancer. EXAM: PORTABLE CHEST 1 VIEW COMPARISON:  Chest radiograph dated 04/09/2017. CT dated 07/09/2019. FINDINGS: Port-A-Cath with tip in the region of the cavoatrial junction. Right hilar density corresponding to the post treatment/post radiation fibrosis seen on the prior CT. Diffuse interstitial and vascular prominence with scattered faint reticulonodular densities throughout the lungs.  Probable small bilateral pleural effusions. No pneumothorax. The cardiac silhouette is within normal limits. Atherosclerotic calcification of the aorta. No acute osseous pathology. IMPRESSION: Right hilar post radiation fibrosis and scattered bilateral reticulonodular densities and small bilateral pleural effusions. Overall findings are similar to the CT of 07/09/2019. Electronically Signed   By: Anner Crete M.D.   On: 08/05/2019 22:59   ECHOCARDIOGRAM COMPLETE  Result Date: 08/14/2019    ECHOCARDIOGRAM REPORT   Patient Name:   KENEDEE BOHAC Date of Exam: 08/14/2019 Medical Rec #:  [id number]          Height:       63.0 in Accession #:    [id number]         Weight:       198.0 lb Date of Birth:  01-Aug-1960         BSA:          1.925 m Patient Age:    78 years           BP:           105/71 mmHg Patient Gender: F                  HR:           95 bpm. Exam Location:  [facility] Procedure: 2D Echo, Cardiac Doppler and Color Doppler Indications:     Dyspnea 786.09  History:         Patient has prior history of Echocardiogram examinations, most                  recent 04/22/2019. Breast cancer, covid-19, history of  chemotherapy.  Sonographer:     [name] RDCS (AE) Referring Phys:  [id number] [facility] AMIN Diagnosing Phys: Serafina Royals MD  Sonographer Comments: Suboptimal apical window. IMPRESSIONS  1. Left ventricular ejection fraction, by estimation, is 55 to 60%. The left ventricle has normal function. The left ventricle has no regional wall motion abnormalities. Left ventricular diastolic parameters were normal.  2. Right ventricular systolic function is normal. The right ventricular size is normal. There is normal pulmonary artery systolic pressure.  3. The mitral valve is normal in structure. Trivial mitral valve regurgitation.  4. The aortic valve is normal in structure. Aortic valve regurgitation is not visualized. FINDINGS  Left Ventricle: Left ventricular ejection fraction, by  estimation, is 55 to 60%. The left ventricle has normal function. The left ventricle has no regional wall motion abnormalities. The left ventricular internal cavity size was normal in size. There is  no left ventricular hypertrophy. Left ventricular diastolic parameters were normal. Right Ventricle: The right ventricular size is normal. No increase in right ventricular wall thickness. Right ventricular systolic function is normal. There is normal pulmonary artery systolic pressure. The tricuspid regurgitant velocity is 1.87 m/s, and  with an assumed right atrial pressure of 10 mmHg, the estimated right ventricular systolic pressure is 0000000 mmHg. Left Atrium: Left atrial size was normal in size. Right Atrium: Right atrial size was normal in size. Pericardium: There is no evidence of pericardial effusion. Mitral Valve: The mitral valve is normal in structure. Trivial mitral valve regurgitation. Tricuspid Valve: The tricuspid valve is normal in structure. Tricuspid valve regurgitation is trivial. Aortic Valve: The aortic valve is normal in structure. Aortic valve regurgitation is not visualized. Aortic valve mean gradient measures 2.0 mmHg. Aortic valve peak gradient measures 3.3 mmHg. Aortic valve area, by VTI measures 3.78 cm. Pulmonic Valve: The pulmonic valve was normal in structure. Pulmonic valve regurgitation is not visualized. Aorta: The aortic root and ascending aorta are structurally normal, with no evidence of dilitation. IAS/Shunts: No atrial level shunt detected by color flow Doppler.  LEFT VENTRICLE PLAX 2D LVIDd:         3.86 cm  Diastology LVIDs:         2.43 cm  LV e' lateral:   9.79 cm/s LV PW:         0.82 cm  LV E/e' lateral: 6.0 LV IVS:        0.82 cm  LV e' medial:    6.96 cm/s LVOT diam:     2.00 cm  LV E/e' medial:  8.4 LV SV:         44 LV SV Index:   23 LVOT Area:     3.14 cm  RIGHT VENTRICLE RV Basal diam:  3.94 cm TAPSE (M-mode): 3.5 cm LEFT ATRIUM           Index       RIGHT ATRIUM            Index LA diam:      2.00 cm 1.04 cm/m  RA Area:     19.40 cm LA Vol (A2C): 24.8 ml 12.88 ml/m RA Volume:   58.80 ml  30.54 ml/m LA Vol (A4C): 24.8 ml 12.88 ml/m  AORTIC VALVE                   PULMONIC VALVE AV Area (Vmax):    3.11 cm    PV Vmax:        0.48 m/s AV Area (Vmean):  3.48 cm    PV Peak grad:   0.9 mmHg AV Area (VTI):     3.78 cm    RVOT Peak grad: 1 mmHg AV Vmax:           90.35 cm/s AV Vmean:          62.550 cm/s AV VTI:            0.115 m AV Peak Grad:      3.3 mmHg AV Mean Grad:      2.0 mmHg LVOT Vmax:         89.40 cm/s LVOT Vmean:        69.200 cm/s LVOT VTI:          0.139 m LVOT/AV VTI ratio: 1.20  AORTA Ao Root diam: 2.50 cm MITRAL VALVE               TRICUSPID VALVE MV Area (PHT): 8.62 cm    TR Peak grad:   14.0 mmHg MV Decel Time: 88 msec     TR Vmax:        187.00 cm/s MV E velocity: 58.70 cm/s MV A velocity: 86.80 cm/s  SHUNTS MV E/A ratio:  0.68        Systemic VTI:  0.14 m                            Systemic Diam: 2.00 cm Serafina Royals MD Electronically signed by Serafina Royals MD Signature Date/Time: 08/14/2019/1:54:24 PM    Final      Assessment and plan- Patient is a 59 y.o. 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 Difficult to say what is causing acute exacerbation. Echo is normal. Recent CT showed no PE. Worsening b/l opacities suggestive of pneumonia. 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 Recovered from covid in December 2020 but was not hospitalized back then. Gemcitabine induced pneumonitis is another differential.  She remains on IV lasix, IV steroids and antibiotics hoping for clinical improvement. Pulmonary on board. She desires trial of intubation if her condition worsens.   On prn morphine and prn ativan.   Continue present measures.   I have discussed her case with Dr. Laural Roes as well.    Visit  Diagnosis 1. Acute respiratory failure with hypoxia (HCC)   2. Carcinoma of right breast metastatic to lung ([place])   3. COPD exacerbation ([place])   4. Acute pulmonary edema (HCC)      Dr. Randa Evens, MD, MPH [facility] [id number] 08/19/2019 3:23 PM

## 2019-08-19 NOTE — Consult Note (Signed)
Cardiology Consultation Note    Patient ID: Ashley Molina, MRN: [id number], DOB/AGE: Jul 09, 1960 59 y.o. Admit date: 08/17/2019   Date of Consult: 08/19/2019 Primary Physician: Patient, No Pcp Per Primary Cardiologist: NONE  Chief Complaint: sob Reason for Consultation: abnormal troponin Requesting MD: Dr. Florene Glen  HPI: Ashley Molina is a 59 y.o. female with history of breast carcinoma with lung metastasis status post lumpectomy, chemotherapy and radiation therapy, history of COVID-19 in December 2020, history of dyslipidemia, anemia admitted with increasing shortness of breath and fatigue.  She was recently discharged from [facility] on March 10 with acute on chronic respiratory failure requiring BiPAP.  She felt worse after discharge and return.  In the emergency room she was tachycardic with EKG showing A. fib.  BNP was 400.  High-sensitivity troponin drawn per protocol was 98.  Electrocardiogram showed sinus tachycardia with no ischemia..  Chest x-ray suggested possible pulmonary edema.  Pneumonia was not excluded.  She had an echocardiogram which was read as showing normal LV function EF 55 to 60% with no regional wall motion abnormalities.  Right ventricular function was normal.  There was trivial MR and TR.  Comparison of echo done in 2019 and 11 of 2020 showed no change.  BNP on admission was 400.  This is reduced from BNP is done during her previous admission with there were in the low 600s.  Repeat BNP today was 291.  Chest CT 3-21 showed no pulmonary embolus.  Renal function was normal.  CBC was elevated but no evidence of anemia.  Blood cultures are negative thus far.  She is currently being treated with furosemide 40 mg IV twice daily, metoprolol 25 mg twice daily, as well as bronchodilators including methylprednisolone.  She denies being aware of atrial fibrillation or rapid heart rate until recently.  She has been on metoprolol tartrate 25 twice daily for some time.  She is resting  comfortably today.  Past Medical History:  Diagnosis Date  . Anemia   . Breast cancer ([place])   . Breast cancer, right ([place]) 04/2017   Hx Lumpectomy, Chemo + Rad tx's.  . Breast cyst, right    aspirated by Dr. Bary Castilla  . COVID-19   . Hyperlipidemia   . Personal history of chemotherapy   . Pre-diabetes       Surgical History:  Past Surgical History:  Procedure Laterality Date  . AXILLARY LYMPH NODE BIOPSY Right 04/09/2017   Procedure: AXILLARY LYMPH NODE BIOPSY;  Surgeon: Robert Bellow, MD;  Location: [facility] ORS;  Service: General;  Laterality: Right;  . BREAST BIOPSY Right 03/27/2017   US guided breast mass - invasive mammary carcinoma  . BREAST BIOPSY Right 03/27/2017   Lymph node - metastatic carcinoma  . BREAST BIOPSY Right 04/09/2017   Lymph node   . BREAST CYST ASPIRATION Right 11/2009   Dr. Bary Castilla did FNA  . BREAST EXCISIONAL BIOPSY Right 09/28/2017   lumpectomy and 12 lympnode rad neo adj chemo  . COLONOSCOPY  2011  . DILATION AND CURETTAGE OF UTERUS     X3  . ENDOBRONCHIAL ULTRASOUND N/A 04/09/2017   Procedure: ENDOBRONCHIAL ULTRASOUND;  Surgeon: Laverle Hobby, MD;  Location: [facility] ORS;  Service: Pulmonary;  Laterality: N/A;  . ENDOMETRIAL ABLATION    . ENDOMETRIAL BIOPSY  09/2009  . PORTACATH PLACEMENT Left 04/09/2017   Procedure: INSERTION PORT-A-CATH;  Surgeon: Robert Bellow, MD;  Location: [facility] ORS;  Service: General;  Laterality: Left;     Home Meds: Prior  to Admission medications   Medication Sig Start Date End Date Taking? Authorizing Provider  Biotin 2500 MCG CAPS Take 5,000 mcg/day by mouth at bedtime.    Yes [provider]  chlorpheniramine-HYDROcodone (TUSSIONEX) 10-8 MG/5ML SUER Take 5 mLs by mouth every 12 (twelve) hours. 08/16/19  Yes Lorella Nimrod, MD  Cholecalciferol (VITAMIN D3) 10000 units TABS Take 1 tablet by mouth at bedtime.    Yes [provider]  Ferrous Sulfate (IRON) 325 (65 Fe) MG TABS Take 1 tablet by  mouth at bedtime.    Yes [provider]  Fluticasone Furoate (ARNUITY ELLIPTA) 100 MCG/ACT AEPB Inhale 1 puff into the lungs daily. 06/11/19  Yes Tyler Pita, MD  guaiFENesin-dextromethorphan (ROBITUSSIN DM) 100-10 MG/5ML syrup Take 15 mLs by mouth every 4 (four) hours as needed for cough. 08/16/19  Yes Lorella Nimrod, MD  ipratropium-albuterol (DUONEB) 0.5-2.5 (3) MG/3ML SOLN Take 3 mLs by nebulization every 6 (six) hours. 08/16/19  Yes Lorella Nimrod, MD  metoprolol tartrate (LOPRESSOR) 25 MG tablet Take 1 tablet (25 mg total) by mouth 2 (two) times daily. 08/16/19  Yes Lorella Nimrod, MD  Multiple Vitamins-Minerals (CENTRUM SILVER ADULT 50+) TABS Take 1 tablet by mouth at bedtime.    Yes [provider]  Omega-3 Fatty Acids (FISH OIL) 1000 MG CAPS Take 1 capsule by mouth at bedtime.    Yes [provider]  pantoprazole (PROTONIX) 20 MG tablet Take 1 tablet (20 mg total) by mouth daily. 06/20/19  Yes Sindy Guadeloupe, MD  predniSONE (DELTASONE) 20 MG tablet Take 2 tablets (40 mg total) by mouth daily with breakfast. Patient taking differently: Take 20 mg by mouth 2 (two) times daily with a meal.  08/11/19  Yes Sindy Guadeloupe, MD  sulfamethoxazole-trimethoprim (BACTRIM DS) 800-160 MG tablet Take 1 tablet by mouth 3 (three) times a week. 08/18/19  Yes Lorella Nimrod, MD  budesonide (PULMICORT) 0.5 MG/2ML nebulizer solution Take 2 mLs (0.5 mg total) by nebulization 2 (two) times daily. 08/16/19   Lorella Nimrod, MD  furosemide (LASIX) 20 MG tablet Take 1 tablet (20 mg total) by mouth daily. Diuretic, water pill. 08/16/19 09/15/19  Lorella Nimrod, MD  prochlorperazine (COMPAZINE) 10 MG tablet Take 1 tablet (10 mg total) by mouth every 6 (six) hours as needed (Nausea or vomiting). 04/05/17 09/05/18  Sindy Guadeloupe, MD    Inpatient Medications:  . aspirin EC  81 mg Oral Daily  . budesonide  0.5 mg Nebulization BID  . Chlorhexidine Gluconate Cloth  6 each Topical Daily  .  chlorpheniramine-HYDROcodone  5 mL Oral Q12H  . cholecalciferol  1,000 Units Oral QHS  . enoxaparin (LOVENOX) injection  40 mg Subcutaneous Q24H  . ferrous sulfate  325 mg Oral QHS  . furosemide  40 mg Intravenous BID  . ipratropium  0.5 mg Nebulization Q6H  . ipratropium-albuterol  3 mL Nebulization Once  . levalbuterol  0.63 mg Nebulization Q6H  . methylPREDNISolone (SOLU-MEDROL) injection  40 mg Intravenous Q8H  . metoprolol tartrate  25 mg Oral Once  . metoprolol tartrate  25 mg Oral BID  . multivitamin with minerals  1 tablet Oral QHS  . omega-3 acid ethyl esters  1 g Oral Daily  . pantoprazole  20 mg Oral Daily  . sodium chloride flush  3 mL Intravenous Q12H  . sulfamethoxazole-trimethoprim  1 tablet Oral Once per day on Mon Wed Fri   . sodium chloride    . azithromycin Stopped (08/19/19 1037)  . cefTRIAXone (  ROCEPHIN)  IV 200 mL/hr at 08/19/19 1102    Allergies:  Allergies  Allergen Reactions  . Penicillins Rash    Did it involve swelling of the face/tongue/throat, SOB, or low BP? No Did it involve sudden or severe rash/hives, skin peeling, or any reaction on the inside of your mouth or nose? Yes Did you need to seek medical attention at a hospital or doctor's office? No When did it last happen? If all above answers are "NO", may proceed with cephalosporin use.    Social History   Socioeconomic History  . Marital status: Married    Spouse name: Not on file  . Number of children: Not on file  . Years of education: Not on file  . Highest education level: Not on file  Occupational History  . Not on file  Tobacco Use  . Smoking status: Never Smoker  . Smokeless tobacco: Never Used  Substance and Sexual Activity  . Alcohol use: Not Currently  . Drug use: No  . Sexual activity: Not Currently  Other Topics Concern  . Not on file  Social History Narrative  . Not on file   Social Determinants of Health   Financial Resource Strain:   . Difficulty of Paying  Living Expenses:   Food Insecurity:   . Worried About Charity fundraiser in the Last Year:   . Arboriculturist in the Last Year:   Transportation Needs:   . Film/video editor (Medical):   Marland Kitchen Lack of Transportation (Non-Medical):   Physical Activity:   . Days of Exercise per Week:   . Minutes of Exercise per Session:   Stress:   . Feeling of Stress :   Social Connections:   . Frequency of Communication with Friends and Family:   . Frequency of Social Gatherings with Friends and Family:   . Attends Religious Services:   . Active Member of Clubs or Organizations:   . Attends Archivist Meetings:   Marland Kitchen Marital Status:   Intimate Partner Violence:   . Fear of Current or Ex-Partner:   . Emotionally Abused:   Marland Kitchen Physically Abused:   . Sexually Abused:      Family History  Problem Relation Age of Onset  . Melanoma Maternal Grandmother 48       currently 86  . Brain cancer Maternal Grandfather 109       unk. type; deceased in 65s  . Melanoma Other 36       mat grandmother's father  . Melanoma Father 73       on head; currently 24  . Prostate cancer Maternal Uncle        3 maternal uncles; dx in 75s  . Breast cancer Other        mat grandfather's sister; dx 100s     Review of Systems: A 12-system review of systems was performed and is negative except as noted in the HPI.  Labs: No results for input(s): CKTOTAL, CKMB, TROPONINI in the last 72 hours. Lab Results  Component Value Date   WBC 14.7 (H) 08/19/2019   HGB 12.1 08/19/2019   HCT 39.0 08/19/2019   MCV 91.5 08/19/2019   PLT 186 08/19/2019    Recent Labs  Lab 08/19/19 0444  NA 141  K 3.9  CL 96*  CO2 31  BUN 32*  CREATININE 0.81  CALCIUM 10.1  PROT 6.3*  BILITOT 0.8  ALKPHOS 88  ALT 23  AST 20  GLUCOSE 142*  Lab Results  Component Value Date   CHOL 195 08/06/2019   HDL 54 08/06/2019   LDLCALC 131 (H) 08/06/2019   TRIG 51 08/06/2019   No results found for: DDIMER  Radiology/Studies:   DG Chest 1 View  Result Date: 08/18/2019 CLINICAL DATA:  59 year old female with shortness of breath. EXAM: CHEST  1 VIEW COMPARISON:  Chest radiograph dated 08/14/2019 and CT dated 08/06/2019 FINDINGS: Right hilar post treatment changes. There is diffuse interstitial prominence and lower lung field hazy densities likely representing edema. Pneumonia is not excluded. Clinical correlation is recommended. There is no pneumothorax. There is a small right pleural effusion. Stable cardiac silhouette. Left pectoral Port-A-Cath with tip close to the cavoatrial junction. Atherosclerotic calcification of the aorta. No acute osseous pathology. IMPRESSION: Findings likely representing edema. Pneumonia is not excluded. Electronically Signed   By: Anner Crete M.D.   On: 08/18/2019 00:50   CT Angio Chest PE W and/or Wo Contrast  Result Date: 08/06/2019 CLINICAL DATA:  Hemoptysis, history of breast cancer and COVID EXAM: CT ANGIOGRAPHY CHEST WITH CONTRAST TECHNIQUE: Multidetector CT imaging of the chest was performed using the standard protocol during bolus administration of intravenous contrast. Multiplanar CT image reconstructions and MIPs were obtained to evaluate the vascular anatomy. CONTRAST:  30mL OMNIPAQUE IOHEXOL 350 MG/ML SOLN COMPARISON:  July 09, 2019 FINDINGS: Cardiovascular: There is a optimal opacification of the pulmonary arteries. There is no central,segmental, or subsegmental filling defects within the pulmonary arteries. A left-sided MediPort catheter is seen with the tip in the right atrium. The heart is normal in size. No pericardial effusion or thickening. No evidence right heart strain. There is normal three-vessel brachiocephalic anatomy without proximal stenosis. Scattered mild aortic atherosclerosis. Mediastinum/Nodes: No hilar, mediastinal, or axillary adenopathy. Thyroid gland, trachea, and esophagus demonstrate no significant findings. Lungs/Pleura: There is new/worsening multifocal  patchy predominantly peripherally based ground-glass opacities seen throughout both lungs. There is also slight interval worsening in the tree-in-bud nodular hazy opacities within the left lung apex. There is unchanged spiculated masslike consolidation within the right infrahilar region, likely from post treatment changes/fibrosis. There is a trace right pleural effusion present. Upper Abdomen: No acute abnormalities present in the visualized portions of the upper abdomen. Musculoskeletal: No chest wall abnormality. No acute or significant osseous findings. Again noted is diffuse skin thickening seen along the right breast. There is a surgical clips within the right breast. Review of the MIP images confirms the above findings. IMPRESSION: 1. No central, segmental, or subsegmental pulmonary embolism. 2. Interval worsening of the multifocal patchy ground-glass opacities throughout both lungs, consistent with multifocal pneumonia. 3. Interval slight worsening in the tree-in-bud nodular opacities within the left upper lobe, likely due to atypical infectious etiology, however cannot exclude underlying metastatic disease. 4. Post treatment radiation/fibrotic changes in the right infrahilar region. 5. Trace right pleural effusion Electronically Signed   By: Prudencio Pair M.D.   On: 08/06/2019 00:17   DG Chest Port 1 View  Result Date: 08/14/2019 CLINICAL DATA:  Acute respiratory failure. EXAM: PORTABLE CHEST 1 VIEW COMPARISON:  Chest x-ray 08/13/2019 FINDINGS: The left subclavian power port is stable. Stable radiation changes involving the right hilum and right paramediastinal lung. Persistent patchy nodular infiltrates and small right effusion. No pneumothorax. IMPRESSION: Stable chest x-ray. Persistent patchy nodular infiltrates and small right effusion. Electronically Signed   By: Marijo Sanes M.D.   On: 08/14/2019 06:19   DG Chest Port 1 View  Result Date: 08/13/2019 CLINICAL DATA:  Shortness of breath,  tachypnea with decreased oxygen saturations., history of breast cancer. EXAM: PORTABLE CHEST 1 VIEW COMPARISON:  08/06/2019, CT of the chest. FINDINGS: Masslike appearance of right hilum similar to previous exam. Cardiomediastinal contours are stable. Left Port-A-Cath terminates at the caval to atrial junction. Nodular opacities are again suggested and there is increase in interstitial prominence when compared to the chest x-ray from 08/05/2019. Blunting of right costophrenic angles suggest scarring or small effusion. No signs of dense consolidation aside from perihilar changes discussed above. Visualized skeletal structures without acute bone finding. IMPRESSION: Masslike appearance of right hilum similar to previous exam. Nodular opacities are again suggested and there is increase in interstitial prominence when compared to the chest x-ray from 08/05/2019. Findings may represent worsening of pulmonary parenchymal disease, associated with previously reported COVID-19 infection, particularly in the left lung base, potentially with a background of pulmonary edema. Electronically Signed   By: Zetta Bills M.D.   On: 08/13/2019 15:26   DG Chest Portable 1 View  Result Date: 08/05/2019 CLINICAL DATA:  59 year old female with hemoptysis. History of breast cancer. EXAM: PORTABLE CHEST 1 VIEW COMPARISON:  Chest radiograph dated 04/09/2017. CT dated 07/09/2019. FINDINGS: Port-A-Cath with tip in the region of the cavoatrial junction. Right hilar density corresponding to the post treatment/post radiation fibrosis seen on the prior CT. Diffuse interstitial and vascular prominence with scattered faint reticulonodular densities throughout the lungs. Probable small bilateral pleural effusions. No pneumothorax. The cardiac silhouette is within normal limits. Atherosclerotic calcification of the aorta. No acute osseous pathology. IMPRESSION: Right hilar post radiation fibrosis and scattered bilateral reticulonodular densities  and small bilateral pleural effusions. Overall findings are similar to the CT of 07/09/2019. Electronically Signed   By: Anner Crete M.D.   On: 08/05/2019 22:59   ECHOCARDIOGRAM COMPLETE  Result Date: 08/14/2019    ECHOCARDIOGRAM REPORT   Patient Name:   JEMA DELCID Date of Exam: 08/14/2019 Medical Rec #:  [id number]          Height:       63.0 in Accession #:    [id number]         Weight:       198.0 lb Date of Birth:  10-26-1960         BSA:          1.925 m Patient Age:    16 years           BP:           105/71 mmHg Patient Gender: F                  HR:           95 bpm. Exam Location:  [facility] Procedure: 2D Echo, Cardiac Doppler and Color Doppler Indications:     Dyspnea 786.09  History:         Patient has prior history of Echocardiogram examinations, most                  recent 04/22/2019. Breast cancer, covid-19, history of                  chemotherapy.  Sonographer:     [name] RDCS (AE) Referring Phys:  [id number] [facility] AMIN Diagnosing Phys: Serafina Royals MD  Sonographer Comments: Suboptimal apical window. IMPRESSIONS  1. Left ventricular ejection fraction, by estimation, is 55 to 60%. The left ventricle has normal function. The left ventricle has no regional wall motion abnormalities. Left ventricular diastolic parameters were  normal.  2. Right ventricular systolic function is normal. The right ventricular size is normal. There is normal pulmonary artery systolic pressure.  3. The mitral valve is normal in structure. Trivial mitral valve regurgitation.  4. The aortic valve is normal in structure. Aortic valve regurgitation is not visualized. FINDINGS  Left Ventricle: Left ventricular ejection fraction, by estimation, is 55 to 60%. The left ventricle has normal function. The left ventricle has no regional wall motion abnormalities. The left ventricular internal cavity size was normal in size. There is  no left ventricular hypertrophy. Left ventricular diastolic parameters were normal.  Right Ventricle: The right ventricular size is normal. No increase in right ventricular wall thickness. Right ventricular systolic function is normal. There is normal pulmonary artery systolic pressure. The tricuspid regurgitant velocity is 1.87 m/s, and  with an assumed right atrial pressure of 10 mmHg, the estimated right ventricular systolic pressure is 0000000 mmHg. Left Atrium: Left atrial size was normal in size. Right Atrium: Right atrial size was normal in size. Pericardium: There is no evidence of pericardial effusion. Mitral Valve: The mitral valve is normal in structure. Trivial mitral valve regurgitation. Tricuspid Valve: The tricuspid valve is normal in structure. Tricuspid valve regurgitation is trivial. Aortic Valve: The aortic valve is normal in structure. Aortic valve regurgitation is not visualized. Aortic valve mean gradient measures 2.0 mmHg. Aortic valve peak gradient measures 3.3 mmHg. Aortic valve area, by VTI measures 3.78 cm. Pulmonic Valve: The pulmonic valve was normal in structure. Pulmonic valve regurgitation is not visualized. Aorta: The aortic root and ascending aorta are structurally normal, with no evidence of dilitation. IAS/Shunts: No atrial level shunt detected by color flow Doppler.  LEFT VENTRICLE PLAX 2D LVIDd:         3.86 cm  Diastology LVIDs:         2.43 cm  LV e' lateral:   9.79 cm/s LV PW:         0.82 cm  LV E/e' lateral: 6.0 LV IVS:        0.82 cm  LV e' medial:    6.96 cm/s LVOT diam:     2.00 cm  LV E/e' medial:  8.4 LV SV:         44 LV SV Index:   23 LVOT Area:     3.14 cm  RIGHT VENTRICLE RV Basal diam:  3.94 cm TAPSE (M-mode): 3.5 cm LEFT ATRIUM           Index       RIGHT ATRIUM           Index LA diam:      2.00 cm 1.04 cm/m  RA Area:     19.40 cm LA Vol (A2C): 24.8 ml 12.88 ml/m RA Volume:   58.80 ml  30.54 ml/m LA Vol (A4C): 24.8 ml 12.88 ml/m  AORTIC VALVE                   PULMONIC VALVE AV Area (Vmax):    3.11 cm    PV Vmax:        0.48 m/s AV Area  (Vmean):   3.48 cm    PV Peak grad:   0.9 mmHg AV Area (VTI):     3.78 cm    RVOT Peak grad: 1 mmHg AV Vmax:           90.35 cm/s AV Vmean:          62.550 cm/s AV VTI:  0.115 m AV Peak Grad:      3.3 mmHg AV Mean Grad:      2.0 mmHg LVOT Vmax:         89.40 cm/s LVOT Vmean:        69.200 cm/s LVOT VTI:          0.139 m LVOT/AV VTI ratio: 1.20  AORTA Ao Root diam: 2.50 cm MITRAL VALVE               TRICUSPID VALVE MV Area (PHT): 8.62 cm    TR Peak grad:   14.0 mmHg MV Decel Time: 88 msec     TR Vmax:        187.00 cm/s MV E velocity: 58.70 cm/s MV A velocity: 86.80 cm/s  SHUNTS MV E/A ratio:  0.68        Systemic VTI:  0.14 m                            Systemic Diam: 2.00 cm Serafina Royals MD Electronically signed by Serafina Royals MD Signature Date/Time: 08/14/2019/1:54:24 PM    Final     Wt Readings from Last 3 Encounters:  08/19/19 86.3 kg  08/13/19 89.8 kg  08/11/19 92.1 kg    EKG: Sinus tachycardia with no ischemia  Physical Exam:  Blood pressure 112/73, pulse (!) 104, temperature 98 F (36.7 C), temperature source Oral, resp. rate 19, height 5\' 3"  (1.6 m), weight 86.3 kg, last menstrual period 04/04/2012, SpO2 96 %. Body mass index is 33.7 kg/m. General: Well developed, well nourished, in no acute distress. Head: Normocephalic, atraumatic, sclera non-icteric, no xanthomas, nares are without discharge.  Neck: Negative for carotid bruits. JVD not elevated. Lungs: Bilateral rhonchi Heart: Tachycardic with S1 S2. No murmurs, rubs, or gallops appreciated. Abdomen: Soft, non-tender, non-distended with normoactive bowel sounds. No hepatomegaly. No rebound/guarding. No obvious abdominal masses. Msk:  Strength and tone appear normal for age. Extremities: No clubbing or cyanosis. No edema.  Distal pedal pulses are 2+ and equal bilaterally. Neuro: Alert and oriented X 3. No facial asymmetry. No focal deficit. Moves all extremities spontaneously. Psych:  Responds to questions  appropriately with a normal affect.     Assessment and Plan  59 y.o. female with history of breast carcinoma with lung metastasis status post lumpectomy, chemotherapy and radiation therapy, history of COVID-19 in December 2020, history of dyslipidemia, anemia admitted with increasing shortness of breath and fatigue.  She was recently discharged from [facility] on March 10 with acute on chronic respiratory failure requiring BiPAP.  She felt worse after discharge and return.  In the emergency room she was tachycardic with EKG showing A. fib.  BNP was 400.  High-sensitivity troponin drawn per protocol was 98.  Electrocardiogram showed sinus tachycardia with no ischemia..  Chest x-ray suggested possible pulmonary edema.  Pneumonia was not excluded.  She had an echocardiogram which was read as showing normal LV function EF 55 to 60% with no regional wall motion abnormalities.  Right ventricular function was normal.  There was trivial MR and TR.  Comparison of echo done in 2019 and 11 of 2020 showed no change.  BNP on admission was 400.  1.  Tachycardia-appears to be sinus tachycardia.  Rate is improved control currently although is still somewhat tachycardic.  Some of this may be situational given her pulmonary status.  Will attempt switching from metoprolol to tartrate which is less cardiac specific and switch to  Cardizem CD to see if this will help with her respiratory status as well as rate control.  Will need to carefully follow her blood pressure as her pressure is somewhat soft.  2.  Respiratory distress-likely secondary to metastatic disease as well as reactive airway disease.  Would continue with bronchodilators as you are doing.  Will attempt to control heart rate.  Echo shows preserved LV function.  3.  Elevated troponin-appears to be demand.  Does not appear to represent acute ischemic syndrome as she is asymptomatic from this and has no ischemia on electrocardiogram  4.  Congestive heart failure-chest  x-ray suggest possible mild pulmonary edema.  BNP is mildly elevated at 291.  We will continue with careful diuresis following renal function and hemodynamics.  Not a candidate for afterload reduction given for HFpEF and soft blood pressure  4.  Signed, Teodoro Spray MD 08/19/2019, 1:19 PM Pager: [phone]

## 2019-08-20 ENCOUNTER — Encounter: Payer: Self-pay | Admitting: Family Medicine

## 2019-08-20 DIAGNOSIS — J9601 Acute respiratory failure with hypoxia: Secondary | ICD-10-CM

## 2019-08-20 DIAGNOSIS — R5383 Other fatigue: Secondary | ICD-10-CM

## 2019-08-20 DIAGNOSIS — J441 Chronic obstructive pulmonary disease with (acute) exacerbation: Secondary | ICD-10-CM

## 2019-08-20 LAB — COMPREHENSIVE METABOLIC PANEL
ALT: 25 U/L (ref 0–44)
AST: 21 U/L (ref 15–41)
Albumin: 3.3 g/dL — ABNORMAL LOW (ref 3.5–5.0)
Alkaline Phosphatase: 80 U/L (ref 38–126)
Anion gap: 13 (ref 5–15)
BUN: 34 mg/dL — ABNORMAL HIGH (ref 6–20)
CO2: 32 mmol/L (ref 22–32)
Calcium: 9.7 mg/dL (ref 8.9–10.3)
Chloride: 93 mmol/L — ABNORMAL LOW (ref 98–111)
Creatinine, Ser: 0.72 mg/dL (ref 0.44–1.00)
GFR calc Af Amer: >60 mL/min (ref >60)
GFR calc non Af Amer: >60 mL/min (ref >60)
Glucose, Bld: 140 mg/dL — ABNORMAL HIGH (ref 70–99)
Potassium: 3.2 mmol/L — ABNORMAL LOW (ref 3.5–5.1)
Sodium: 138 mmol/L (ref 135–145)
Total Bilirubin: 1 mg/dL (ref 0.3–1.2)
Total Protein: 6 g/dL — ABNORMAL LOW (ref 6.5–8.1)

## 2019-08-20 LAB — CBC WITH DIFFERENTIAL/PLATELET
Abs Immature Granulocytes: 0.14 10*3/uL — ABNORMAL HIGH (ref 0.00–0.07)
Basophils Absolute: 0 10*3/uL (ref 0.0–0.1)
Basophils Relative: 0 %
Eosinophils Absolute: 0 10*3/uL (ref 0.0–0.5)
Eosinophils Relative: 0 %
HCT: 36.8 % (ref 36.0–46.0)
Hemoglobin: 12.1 g/dL (ref 12.0–15.0)
Immature Granulocytes: 1 %
Lymphocytes Relative: 3 %
Lymphs Abs: 0.4 10*3/uL — ABNORMAL LOW (ref 0.7–4.0)
MCH: 29.4 pg (ref 26.0–34.0)
MCHC: 32.9 g/dL (ref 30.0–36.0)
MCV: 89.5 fL (ref 80.0–100.0)
Monocytes Absolute: 0.4 10*3/uL (ref 0.1–1.0)
Monocytes Relative: 3 %
Neutro Abs: 12 10*3/uL — ABNORMAL HIGH (ref 1.7–7.7)
Neutrophils Relative %: 93 %
Platelets: 188 10*3/uL (ref 150–400)
RBC: 4.11 MIL/uL (ref 3.87–5.11)
RDW: 15.3 % (ref 11.5–15.5)
WBC: 12.9 10*3/uL — ABNORMAL HIGH (ref 4.0–10.5)
nRBC: 0 % (ref 0.0–0.2)

## 2019-08-20 LAB — PHOSPHORUS: Phosphorus: 3.9 mg/dL (ref 2.5–4.6)

## 2019-08-20 LAB — MAGNESIUM: Magnesium: 2.3 mg/dL (ref 1.7–2.4)

## 2019-08-20 MED ORDER — DILTIAZEM HCL 30 MG PO TABS
60.0000 mg | ORAL_TABLET | Freq: Four times a day (QID) | ORAL | Status: DC
  Administered 2019-08-20 – 2019-08-22 (×8): 60 mg via ORAL
  Filled 2019-08-20 (×2): qty 2
  Filled 2019-08-20 (×3): qty 1
  Filled 2019-08-20: qty 2
  Filled 2019-08-20: qty 1
  Filled 2019-08-20: qty 2

## 2019-08-20 MED ORDER — METHYLPREDNISOLONE SODIUM SUCC 40 MG IJ SOLR: 40.0000 mg | INTRAMUSCULAR | Status: DC

## 2019-08-20 NOTE — Progress Notes (Signed)
CRITICAL CARE PROGRESS NOTE    Name: Ashley Molina MRN: [id number] DOB: 1961/02/02  Referring physician : Dr Janese Banks    LOS: 2   SUBJECTIVE FINDINGS & SIGNIFICANT EVENTS   Patient description:  Ashley Molina a58 y.o.Caucasian femalewith a known history of breast cancer with lung metastasis status post lumpectomy, chemotherapy and radiotherapy, COVID-19 in December 2020, dyslipidemia and anemia, who presented to [facility] worsening dyspnea with associated cough with inability to expectorate as well as wheezing today. The patient was just discharged from here on 3/10 after being treated for acute on chronic respiratory failure requiring BiPAP. No nausea or vomiting or abdominal pain. She denies any chest pain or palpitations. No fever or chills. No dysuria, oliguria or hematuria or flank pain.  Upon presentation to the emergency room, heart rate was 111 has gone up to 142 with otherwise normal vital signs. EKG showed atrial fibrillation/flutter with a rate of 165. Respiratory clear unless 25-28. Labs revealed potassium of 3.6 magnesium 2.2 anion gap of 15. BNP was 400. High-sensitivity troponin I was 38 and later 98 and CBC showed leukocytosis of 13.9. Influenza antigens and COVID-19 PCR came back negative. Chest x-ray showed findings representing pulmonary edema with pneumonia not excluded.  Lines / Drains: PIVx2  Cultures / Sepsis markers: Resp cx   Antibiotics: Rocephin/zithromax    Protocols / Consultants: Pulm/onc/hospitalist  08/20/19 - Discussed care plan with Dr Patsey Berthold (patients primary pulmonologist) - possible bronchoscopy with BAL next week post d/c., today patient reports clinical improvement although she has had few admissions in past month with similar improvement  followed by regression at this point.  She has recently noted pattern of worsening during taper of steroids and has been on Solumedrol 40 TID >BID3/16/21>daily 08/20/19 today so we will continue to monitor her at this time since we are again now tapering her steroids.   PAST MEDICAL HISTORY   Past Medical History:  Diagnosis Date  . Anemia   . Breast cancer ([place])   . Breast cancer, right ([place]) 04/2017   Hx Lumpectomy, Chemo + Rad tx's.  . Breast cyst, right    aspirated by Dr. Bary Castilla  . COVID-19   . Hyperlipidemia   . Personal history of chemotherapy   . Pre-diabetes      SURGICAL HISTORY   Past Surgical History:  Procedure Laterality Date  . AXILLARY LYMPH NODE BIOPSY Right 04/09/2017   Procedure: AXILLARY LYMPH NODE BIOPSY;  Surgeon: Robert Bellow, MD;  Location: [facility] ORS;  Service: General;  Laterality: Right;  . BREAST BIOPSY Right 03/27/2017   US guided breast mass - invasive mammary carcinoma  . BREAST BIOPSY Right 03/27/2017   Lymph node - metastatic carcinoma  . BREAST BIOPSY Right 04/09/2017   Lymph node   . BREAST CYST ASPIRATION Right 11/2009   Dr. Bary Castilla did FNA  . BREAST EXCISIONAL BIOPSY Right 09/28/2017   lumpectomy and 12 lympnode rad neo adj chemo  . COLONOSCOPY  2011  . DILATION AND CURETTAGE OF UTERUS     X3  . ENDOBRONCHIAL ULTRASOUND N/A 04/09/2017   Procedure: ENDOBRONCHIAL ULTRASOUND;  Surgeon: Laverle Hobby, MD;  Location: [facility] ORS;  Service: Pulmonary;  Laterality: N/A;  . ENDOMETRIAL ABLATION    . ENDOMETRIAL BIOPSY  09/2009  . PORTACATH PLACEMENT Left 04/09/2017   Procedure: INSERTION PORT-A-CATH;  Surgeon: Robert Bellow, MD;  Location: [facility] ORS;  Service: General;  Laterality: Left;     FAMILY HISTORY   Family History  Problem Relation  Age of Onset  . Melanoma Maternal Grandmother 21       currently 8  . Brain cancer Maternal Grandfather 47       unk. type; deceased in 53s  . Melanoma Other 45       mat grandmother's  father  . Melanoma Father 41       on head; currently 57  . Prostate cancer Maternal Uncle        3 maternal uncles; dx in 64s  . Breast cancer Other        mat grandfather's sister; dx 87s     SOCIAL HISTORY   Social History   Tobacco Use  . Smoking status: Never Smoker  . Smokeless tobacco: Never Used  Substance Use Topics  . Alcohol use: Not Currently  . Drug use: No     MEDICATIONS   Current Medication:  Current Facility-Administered Medications:  .  0.9 %  sodium chloride infusion, 250 mL, Intravenous, PRN, Mansy, Jan A, MD .  acetaminophen (TYLENOL) tablet 650 mg, 650 mg, Oral, Q4H PRN, Mansy, Jan A, MD .  aspirin EC tablet 81 mg, 81 mg, Oral, Daily, Mansy, Jan A, MD, 81 mg at 08/20/19 0903 .  azithromycin (ZITHROMAX) 500 mg in sodium chloride 0.9 % 250 mL IVPB, 500 mg, Intravenous, Q24H, Mansy, Arvella Merles, MD, Stopped at 08/20/19 0749 .  budesonide (PULMICORT) nebulizer solution 0.5 mg, 0.5 mg, Nebulization, BID, Mansy, Jan A, MD, 0.5 mg at 08/20/19 0745 .  cefTRIAXone (ROCEPHIN) 2 g in sodium chloride 0.9 % 100 mL IVPB, 2 g, Intravenous, Q24H, Mansy, Arvella Merles, MD, Stopped at 08/20/19 0636 .  Chlorhexidine Gluconate Cloth 2 % PADS 6 each, 6 each, Topical, Daily, Elodia Florence., MD, 6 each at 08/18/19 1401 .  cholecalciferol (VITAMIN D3) tablet 1,000 Units, 1,000 Units, Oral, QHS, Mansy, Arvella Merles, MD, 1,000 Units at 08/19/19 2106 .  diltiazem (CARDIZEM) tablet 60 mg, 60 mg, Oral, Q6H, Teodoro Spray, MD, 60 mg at 08/20/19 1714 .  enoxaparin (LOVENOX) injection 40 mg, 40 mg, Subcutaneous, Q24H, Mansy, Jan A, MD, 40 mg at 08/20/19 0551 .  ferrous sulfate tablet 325 mg, 325 mg, Oral, QHS, Mansy, Jan A, MD, 325 mg at 08/19/19 2106 .  furosemide (LASIX) injection 40 mg, 40 mg, Intravenous, BID, Elodia Florence., MD, 40 mg at 08/20/19 1254 .  guaiFENesin-dextromethorphan (ROBITUSSIN DM) 100-10 MG/5ML syrup 15 mL, 15 mL, Oral, Q4H PRN, Mansy, Jan A, MD .  ipratropium  (ATROVENT) nebulizer solution 0.5 mg, 0.5 mg, Nebulization, Q6H, Elodia Florence., MD, 0.5 mg at 08/20/19 1341 .  ipratropium-albuterol (DUONEB) 0.5-2.5 (3) MG/3ML nebulizer solution 3 mL, 3 mL, Nebulization, Once, Blake Divine, MD .  levalbuterol [facility]) nebulizer solution 0.63 mg, 0.63 mg, Nebulization, Q6H, Elodia Florence., MD, 0.63 mg at 08/20/19 1341 .  LORazepam (ATIVAN) tablet 0.5 mg, 0.5 mg, Oral, Q6H PRN, Borders, Vonna Kotyk R, NP, 0.5 mg at 08/19/19 1132 .  methylPREDNISolone sodium succinate (SOLU-MEDROL) 40 mg/mL injection 40 mg, 40 mg, Intravenous, Q12H, Lanney Gins, Ladawn Boullion, MD, 40 mg at 08/20/19 1254 .  metoprolol tartrate (LOPRESSOR) tablet 25 mg, 25 mg, Oral, Once, Blake Divine, MD, Stopped at 08/18/19 0424 .  morphine 2 MG/ML injection 1-2 mg, 1-2 mg, Intravenous, Q2H PRN, Borders, Kirt Boys, NP .  multivitamin with minerals tablet 1 tablet, 1 tablet, Oral, QHS, Mansy, Jan A, MD, 1 tablet at 08/19/19 2106 .  omega-3 acid ethyl esters (LOVAZA) capsule 1 g,  1 g, Oral, Daily, Mansy, Jan A, MD, 1 g at 08/20/19 0903 .  ondansetron (ZOFRAN) injection 4 mg, 4 mg, Intravenous, Q6H PRN, Mansy, Jan A, MD .  pantoprazole (PROTONIX) EC tablet 20 mg, 20 mg, Oral, Daily, Mansy, Jan A, MD, 20 mg at 08/20/19 0903 .  sodium chloride flush (NS) 0.9 % injection 3 mL, 3 mL, Intravenous, Q12H, Mansy, Jan A, MD, 3 mL at 08/19/19 2108 .  sodium chloride flush (NS) 0.9 % injection 3 mL, 3 mL, Intravenous, PRN, Mansy, Jan A, MD .  sulfamethoxazole-trimethoprim (BACTRIM DS) 800-160 MG per tablet 1 tablet, 1 tablet, Oral, Once per day on Mon Wed Fri, Mansy, Jan A, MD, 1 tablet at 08/20/19 1914    ALLERGIES   Penicillins    REVIEW OF SYSTEMS     10 point ROS done and is negative except as per subjective findings  PHYSICAL EXAMINATION   Vital Signs: Temp:  [98 F (36.7 C)-98.9 F (37.2 C)] 98.9 F (37.2 C) (03/17 1400) Pulse Rate:  [87-119] 95 (03/17 1700) Resp:  [11-30] 20  (03/17 1700) BP: (89-123)/(51-80) 108/65 (03/17 1700) SpO2:  [91 %-99 %] 97 % (03/17 1700) Weight:  [92.5 kg] 92.5 kg (03/17 0500)  GENERAL:mild distress due to hypoxemia HEAD: Normocephalic, atraumatic.  EYES: Pupils equal, round, reactive to light.  No scleral icterus.  MOUTH: Moist mucosal membrane. NECK: Supple. No thyromegaly. No nodules. No JVD.  PULMONARY: mild rhonchorous breath sounds bilaterally  CARDIOVASCULAR: S1 and S2. Regular rate and rhythm. No murmurs, rubs, or gallops.  GASTROINTESTINAL: Soft, nontender, non-distended. No masses. Positive bowel sounds. No hepatosplenomegaly.  MUSCULOSKELETAL: No swelling, clubbing, or edema.  NEUROLOGIC: Mild distress due to acute illness SKIN:intact,warm,dry   PERTINENT DATA     Infusions: . sodium chloride    . azithromycin Stopped (08/20/19 0749)  . cefTRIAXone (ROCEPHIN)  IV Stopped (08/20/19 0636)   Scheduled Medications: . aspirin EC  81 mg Oral Daily  . budesonide  0.5 mg Nebulization BID  . Chlorhexidine Gluconate Cloth  6 each Topical Daily  . cholecalciferol  1,000 Units Oral QHS  . diltiazem  60 mg Oral Q6H  . enoxaparin (LOVENOX) injection  40 mg Subcutaneous Q24H  . ferrous sulfate  325 mg Oral QHS  . furosemide  40 mg Intravenous BID  . ipratropium  0.5 mg Nebulization Q6H  . ipratropium-albuterol  3 mL Nebulization Once  . levalbuterol  0.63 mg Nebulization Q6H  . methylPREDNISolone (SOLU-MEDROL) injection  40 mg Intravenous Q12H  . metoprolol tartrate  25 mg Oral Once  . multivitamin with minerals  1 tablet Oral QHS  . omega-3 acid ethyl esters  1 g Oral Daily  . pantoprazole  20 mg Oral Daily  . sodium chloride flush  3 mL Intravenous Q12H  . sulfamethoxazole-trimethoprim  1 tablet Oral Once per day on Mon Wed Fri   PRN Medications: sodium chloride, acetaminophen, guaiFENesin-dextromethorphan, LORazepam, morphine, ondansetron (ZOFRAN) IV, sodium chloride flush Hemodynamic parameters:    Intake/Output: 03/16 0701 - 03/17 0700 In: 1030.3 [P.O.:360; IV Piggyback:670.3] Out: 2000 [Urine:2000]  Ventilator  Settings:       LAB RESULTS:  Basic Metabolic Panel: Recent Labs  Lab 08/15/19 0639 08/15/19 0639 08/16/19 0601 08/16/19 0601 08/18/19 0045 08/18/19 0045 08/19/19 0444 08/20/19 0624  NA 136  --  136  --  139  --  141 138  K 3.6   < > 4.0   < > 3.6   < > 3.9 3.2*  CL 99  --  97*  --  96*  --  96* 93*  CO2 27  --  30  --  28  --  31 32  GLUCOSE 129*  --  129*  --  131*  --  142* 140*  BUN 32*  --  25*  --  30*  --  32* 34*  CREATININE 0.89  --  0.74  --  0.74  --  0.81 0.72  CALCIUM 9.5  --  9.3  --  9.9  --  10.1 9.7  MG 2.1  --  2.2  --  2.2  --  2.4 2.3  PHOS  --   --  3.4  --   --   --  4.3 3.9   < > = values in this interval not displayed.   Liver Function Tests: Recent Labs  Lab 08/18/19 0045 08/19/19 0444 08/20/19 0624  AST _0 ALT _1 ALKPHOS 105 88 80  BILITOT 0.9 0.8 1.0  PROT 6.9 6.3* 6.0*  ALBUMIN 4.0 3.4* 3.3*   No results for input(s): LIPASE, AMYLASE in the last 168 hours. No results for input(s): AMMONIA in the last 168 hours. CBC: Recent Labs  Lab 08/15/19 0639 08/16/19 0601 08/18/19 0004 08/19/19 0444 08/20/19 0624  WBC 10.0 10.4 13.9* 14.7* 12.9*  NEUTROABS  --   --   --  13.3* 12.0*  HGB 11.7* 11.6* 13.5 12.1 12.1  HCT 38.0 36.5 44.2 39.0 36.8  MCV 92.0 91.5 94.2 91.5 89.5  PLT 201 191 181 186 188   Cardiac Enzymes: No results for input(s): CKTOTAL, CKMB, CKMBINDEX, TROPONINI in the last 168 hours. BNP: Invalid input(s): POCBNP CBG: Recent Labs  Lab 08/13/19 2035 08/18/19 1039  GLUCAP 137* 128*     IMAGING RESULTS:  Imaging: No results found.    ASSESSMENT AND PLAN    -Multidisciplinary rounds held today  Acute Hypoxic Respiratory Failure -patient has airspace and interstitial opaification on CT chest 08/07/19 as above - recent re-hospitalization with increased O2 requirement  -absence of PE on above CT PE -patient has been diuresed and is negative 2L -agree with CAP coverage - currently on Rocephin zithromax -patient has been on prolonged steroids - possible fungal etiology - ordered fungitell and aspergillus ab serum, may benefit from bronchoscopy with airway inspection and BAL -additionally possible metastatic lesions (although lung opacities are not typical of mets), possible gemcitabine pnemonitis although this generally does improve with steroids -have discussed case with Dr Janese Banks, additionally I will discuss with patients pulmonologist Dr Patsey Berthold -wean supplemental O2 as able -remains on 5L [place] -RT for chest physiotherapy - will start [place] today  ID -continue IV abx as prescibed -follow up cultures  GI/Nutrition GI PROPHYLAXIS as indicated DIET-->TF's as tolerated Constipation protocol as indicated  ENDO - ICU hypoglycemic\Hyperglycemia protocol -check FSBS per protocol   ELECTROLYTES -follow labs as needed -replace as needed -pharmacy consultation   DVT/GI PRX ordered -SCDs  TRANSFUSIONS AS NEEDED MONITOR FSBS ASSESS the need for LABS as needed   Critical care provider statement:    Critical care time (minutes):  33   Critical care time was exclusive of:  Separately billable procedures and treating other patients   Critical care was necessary to treat or prevent imminent or life-threatening deterioration of the following conditions:  acute hypoxemic respiratory failure, breast ca, multiple comorbid conditions   Critical care was time spent personally by me on the following activities:  Development of treatment plan with patient  or surrogate, discussions with consultants, evaluation of patient's response to treatment, examination of patient, obtaining history from patient or surrogate, ordering and performing treatments and interventions, ordering and review of laboratory studies and re-evaluation of patient's condition.  I assumed  direction of critical care for this patient from another provider in my specialty: no    This document was prepared using Dragon voice recognition software and may include unintentional dictation errors.    Ottie Glazier, M.D.  Division of [place]

## 2019-08-20 NOTE — Progress Notes (Signed)
[place]  [phone] [phone]   Name: DONDA FRIEDLI Date: 08/20/2019 MRN: [id number]  DOB: 1960-12-30  Patient Care Team: Patient, No Pcp Per as PCP - General ([place]) Bary Castilla, Forest Gleason, MD (General Surgery) Gae Dry, MD as Referring Physician (Obstetrics and Gynecology)    REASON FOR CONSULTATION: KENYADA DOSCH is a 59 y.o. 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 Patient had COVID-19 in December 2020.  PMH also notable for O2 dependent COPD.  She has several recent hospitalizations including 08/06/2019-08/07/2019 with hypoxic respiratory failure thought secondary to pneumonitis secondary to COVID-19 versus gemcitabine.  She was readmitted 08/13/2019-08/16/2019 with same.  She is now readmitted 08/17/2019 again with hypoxic respiratory failure.  Palliative care was consulted help address goals.   CODE STATUS: Full code  PAST MEDICAL HISTORY: Past Medical History:  Diagnosis Date  . Anemia   . Breast cancer ([place])   . Breast cancer, right ([place]) 04/2017   Hx Lumpectomy, Chemo + Rad tx's.  . Breast cyst, right    aspirated by Dr. Bary Castilla  . COVID-19   . Hyperlipidemia   . Personal history of chemotherapy   . Pre-diabetes     PAST SURGICAL HISTORY:  Past Surgical History:  Procedure Laterality Date  . AXILLARY LYMPH NODE BIOPSY Right 04/09/2017   Procedure: AXILLARY LYMPH NODE BIOPSY;  Surgeon: Robert Bellow, MD;  Location: [facility] ORS;  Service: General;  Laterality: Right;  . BREAST BIOPSY Right 03/27/2017   US guided breast mass - invasive mammary carcinoma  . BREAST BIOPSY Right 03/27/2017   Lymph node - metastatic carcinoma  . BREAST BIOPSY Right 04/09/2017   Lymph node   . BREAST CYST ASPIRATION Right 11/2009   Dr. Bary Castilla did FNA  . BREAST  EXCISIONAL BIOPSY Right 09/28/2017   lumpectomy and 12 lympnode rad neo adj chemo  . COLONOSCOPY  2011  . DILATION AND CURETTAGE OF UTERUS     X3  . ENDOBRONCHIAL ULTRASOUND N/A 04/09/2017   Procedure: ENDOBRONCHIAL ULTRASOUND;  Surgeon: Laverle Hobby, MD;  Location: [facility] ORS;  Service: Pulmonary;  Laterality: N/A;  . ENDOMETRIAL ABLATION    . ENDOMETRIAL BIOPSY  09/2009  . PORTACATH PLACEMENT Left 04/09/2017   Procedure: INSERTION PORT-A-CATH;  Surgeon: Robert Bellow, MD;  Location: [facility] ORS;  Service: General;  Laterality: Left;    HEMATOLOGY/ONCOLOGY HISTORY:  Oncology History  Malignant neoplasm of upper-outer quadrant of right breast in female, estrogen receptor negative ([place])  04/04/2017 Cancer Staging   Staging form: Breast, AJCC 8th Edition - Clinical stage from 04/04/2017: Stage IV (cT3, cN1, cM1, G3, ER-, PR+, HER2-) - Signed by Sindy Guadeloupe, MD on 09/18/2017   04/05/2017 Initial Diagnosis   Malignant neoplasm of upper-outer quadrant of right breast in female, estrogen receptor negative ([place])   09/12/2018 - 01/08/2019 Chemotherapy   The patient had pegfilgrastim (NEULASTA ONPRO KIT) injection 6 mg, 6 mg, Subcutaneous, Once, 4 of 5 cycles Administration: 6 mg (10/03/2018), 6 mg (11/01/2018), 6 mg (10/17/2018), 6 mg (11/14/2018), 6 mg (11/28/2018), 6 mg (12/12/2018), 6 mg (12/26/2018) eriBULin mesylate (HALAVEN) 2.85 mg in sodium chloride 0.9 % 100 mL chemo infusion, 1.4 mg/m2 = 2.85 mg, Intravenous,  Once, 4 of 5 cycles Dose modification: 1.05 mg/m2 (original dose 1.4 mg/m2, Cycle 1, Reason: Other (see comments), Comment: neutropenia), 1.4 mg/m2 (original dose 1.4 mg/m2,  Cycle 2, Reason: Other (see comments)) Administration: 2.85 mg (09/12/2018), 2.15 mg (10/03/2018), 2.85 mg (11/01/2018), 2.85 mg (10/17/2018), 2.85 mg (11/14/2018), 2.85 mg (11/28/2018), 2.85 mg (12/12/2018), 2.85 mg (12/26/2018)  for chemotherapy treatment.    01/20/2019 -  Chemotherapy   The patient had palonosetron  (ALOXI) injection 0.25 mg, 0.25 mg, Intravenous,  Once, 8 of 9 cycles Administration: 0.25 mg (01/30/2019), 0.25 mg (02/20/2019), 0.25 mg (03/21/2019), 0.25 mg (04/10/2019), 0.25 mg (06/05/2019), 0.25 mg (07/17/2019) pegfilgrastim (NEULASTA ONPRO KIT) injection 6 mg, 6 mg, Subcutaneous, Once, 7 of 8 cycles Administration: 6 mg (01/30/2019), 6 mg (03/07/2019), 6 mg (03/27/2019), 6 mg (04/17/2019), 6 mg (06/12/2019), 6 mg (07/03/2019), 6 mg (07/24/2019) CARBOplatin (PARAPLATIN) 260 mg in sodium chloride 0.9 % 250 mL chemo infusion, 260 mg (100 % of original dose 255 mg), Intravenous,  Once, 2 of 2 cycles Dose modification:   (original dose 255 mg, Cycle 1), 260 mg (original dose 255 mg, Cycle 2) Administration: 260 mg (01/20/2019), 260 mg (01/30/2019), 260 mg (02/20/2019) gemcitabine (GEMZAR) 1,596 mg in sodium chloride 0.9 % 250 mL chemo infusion, 800 mg/m2 = 1,596 mg (100 % of original dose 800 mg/m2), Intravenous,  Once, 8 of 9 cycles Dose modification: 800 mg/m2 (original dose 800 mg/m2, Cycle 1, Reason: Other (see comments), Comment: chemo induced neutropenia) Administration: 1,596 mg (01/20/2019), 1,600 mg (01/30/2019), 1,600 mg (02/20/2019), 1,600 mg (03/07/2019), 1,600 mg (03/21/2019), 1,600 mg (03/27/2019), 1,600 mg (04/10/2019), 1,600 mg (04/17/2019), 1,600 mg (05/06/2019), 1,600 mg (06/05/2019), 1,600 mg (06/12/2019), 1,600 mg (06/26/2019), 1,600 mg (07/03/2019), 1,600 mg (07/17/2019), 1,600 mg (07/24/2019)  for chemotherapy treatment.      ALLERGIES:  is allergic to penicillins.  MEDICATIONS:  Current Facility-Administered Medications  Medication Dose Route Frequency Provider Last Rate Last Admin  . 0.9 %  sodium chloride infusion  250 mL Intravenous PRN Mansy, Jan A, MD      . acetaminophen (TYLENOL) tablet 650 mg  650 mg Oral Q4H PRN Mansy, Jan A, MD      . aspirin EC tablet 81 mg  81 mg Oral Daily Mansy, Jan A, MD   81 mg at 08/20/19 0903  . azithromycin (ZITHROMAX) 500 mg in sodium chloride 0.9 % 250 mL  IVPB  500 mg Intravenous Q24H Mansy, Arvella Merles, MD   Stopped at 08/20/19 0749  . budesonide (PULMICORT) nebulizer solution 0.5 mg  0.5 mg Nebulization BID Mansy, Jan A, MD   0.5 mg at 08/20/19 0745  . cefTRIAXone (ROCEPHIN) 2 g in sodium chloride 0.9 % 100 mL IVPB  2 g Intravenous Q24H Mansy, Arvella Merles, MD   Stopped at 08/20/19 0636  . Chlorhexidine Gluconate Cloth 2 % PADS 6 each  6 each Topical Daily Elodia Florence., MD   6 each at 08/18/19 1401  . cholecalciferol (VITAMIN D3) tablet 1,000 Units  1,000 Units Oral QHS Mansy, Arvella Merles, MD   1,000 Units at 08/19/19 2106  . diltiazem (CARDIZEM) tablet 60 mg  60 mg Oral Q6H Teodoro Spray, MD   60 mg at 08/20/19 1124  . enoxaparin (LOVENOX) injection 40 mg  40 mg Subcutaneous Q24H Mansy, Jan A, MD   40 mg at 08/20/19 0551  . ferrous sulfate tablet 325 mg  325 mg Oral QHS Mansy, Jan A, MD   325 mg at 08/19/19 2106  . furosemide (LASIX) injection 40 mg  40 mg Intravenous BID Elodia Florence., MD   40 mg at 08/20/19 1254  . guaiFENesin-dextromethorphan (ROBITUSSIN DM) 100-10  MG/5ML syrup 15 mL  15 mL Oral Q4H PRN Mansy, Jan A, MD      . ipratropium (ATROVENT) nebulizer solution 0.5 mg  0.5 mg Nebulization Q6H Elodia Florence., MD   0.5 mg at 08/20/19 1341  . ipratropium-albuterol (DUONEB) 0.5-2.5 (3) MG/3ML nebulizer solution 3 mL  3 mL Nebulization Once Blake Divine, MD      . levalbuterol [facility]) nebulizer solution 0.63 mg  0.63 mg Nebulization Q6H Elodia Florence., MD   0.63 mg at 08/20/19 1341  . LORazepam (ATIVAN) tablet 0.5 mg  0.5 mg Oral Q6H PRN Carneshia Raker, Kirt Boys, NP   0.5 mg at 08/19/19 1132  . methylPREDNISolone sodium succinate (SOLU-MEDROL) 40 mg/mL injection 40 mg  40 mg Intravenous Q12H Ottie Glazier, MD   40 mg at 08/20/19 1254  . metoprolol tartrate (LOPRESSOR) tablet 25 mg  25 mg Oral Once Blake Divine, MD   Stopped at 08/18/19 0424  . morphine 2 MG/ML injection 1-2 mg  1-2 mg Intravenous Q2H PRN Danaija Eskridge, Kirt Boys,  NP      . multivitamin with minerals tablet 1 tablet  1 tablet Oral QHS Mansy, Arvella Merles, MD   1 tablet at 08/19/19 2106  . omega-3 acid ethyl esters (LOVAZA) capsule 1 g  1 g Oral Daily Mansy, Jan A, MD   1 g at 08/20/19 0903  . ondansetron (ZOFRAN) injection 4 mg  4 mg Intravenous Q6H PRN Mansy, Jan A, MD      . pantoprazole (PROTONIX) EC tablet 20 mg  20 mg Oral Daily Mansy, Jan A, MD   20 mg at 08/20/19 0903  . sodium chloride flush (NS) 0.9 % injection 3 mL  3 mL Intravenous Q12H Mansy, Jan A, MD   3 mL at 08/19/19 2108  . sodium chloride flush (NS) 0.9 % injection 3 mL  3 mL Intravenous PRN Mansy, Jan A, MD      . sulfamethoxazole-trimethoprim (BACTRIM DS) 800-160 MG per tablet 1 tablet  1 tablet Oral Once per day on Mon Wed Fri Mansy, Jan A, MD   1 tablet at 08/20/19 0903    VITAL SIGNS: BP (!) 102/54   Pulse 87   Temp 98.9 F (37.2 C) (Oral)   Resp 20   Ht _0  (1.6 m)   Wt 203 lb 14.8 oz (92.5 kg)   LMP 04/04/2012 (Approximate)   SpO2 95%   BMI 36.12 kg/m  Filed Weights   08/18/19 1053 08/19/19 0500 08/20/19 0500  Weight: 189 lb 9.5 oz (86 kg) 190 lb 4.1 oz (86.3 kg) 203 lb 14.8 oz (92.5 kg)    Estimated body mass index is 36.12 kg/m as calculated from the following:   Height as of this encounter: _1  (1.6 m).   Weight as of this encounter: 203 lb 14.8 oz (92.5 kg).  LABS: CBC:    Component Value Date/Time   WBC 12.9 (H) 08/20/2019 0624   HGB 12.1 08/20/2019 0624   HGB 12.9 03/28/2017 0805   HCT 36.8 08/20/2019 0624   HCT 38.2 03/28/2017 0805   PLT 188 08/20/2019 0624   PLT 245 03/28/2017 0805   MCV 89.5 08/20/2019 0624   MCV 81 03/28/2017 0805   NEUTROABS 12.0 (H) 08/20/2019 0624   NEUTROABS 2.7 03/28/2017 0805   LYMPHSABS 0.4 (L) 08/20/2019 0624   LYMPHSABS 1.1 03/28/2017 0805   MONOABS 0.4 08/20/2019 0624   EOSABS 0.0 08/20/2019 0624   EOSABS 0.1 03/28/2017 0805   BASOSABS  0.0 08/20/2019 0624   BASOSABS 0.1 03/28/2017 0805   Comprehensive Metabolic  Panel:    Component Value Date/Time   NA 138 08/20/2019 0624   NA 140 03/28/2017 0805   K 3.2 (L) 08/20/2019 0624   CL 93 (L) 08/20/2019 0624   CO2 32 08/20/2019 0624   BUN 34 (H) 08/20/2019 0624   BUN 15 03/28/2017 0805   CREATININE 0.72 08/20/2019 0624   GLUCOSE 140 (H) 08/20/2019 0624   CALCIUM 9.7 08/20/2019 0624   AST 21 08/20/2019 0624   ALT 25 08/20/2019 0624   ALKPHOS 80 08/20/2019 0624   BILITOT 1.0 08/20/2019 0624   BILITOT 0.4 03/28/2017 0805   PROT 6.0 (L) 08/20/2019 0624   PROT 6.9 03/28/2017 0805   ALBUMIN 3.3 (L) 08/20/2019 0624   ALBUMIN 4.3 03/28/2017 0805    RADIOGRAPHIC STUDIES: DG Chest 1 View  Result Date: 08/18/2019 CLINICAL DATA:  59 year old female with shortness of breath. EXAM: CHEST  1 VIEW COMPARISON:  Chest radiograph dated 08/14/2019 and CT dated 08/06/2019 FINDINGS: Right hilar post treatment changes. There is diffuse interstitial prominence and lower lung field hazy densities likely representing edema. Pneumonia is not excluded. Clinical correlation is recommended. There is no pneumothorax. There is a small right pleural effusion. Stable cardiac silhouette. Left pectoral Port-A-Cath with tip close to the cavoatrial junction. Atherosclerotic calcification of the aorta. No acute osseous pathology. IMPRESSION: Findings likely representing edema. Pneumonia is not excluded. Electronically Signed   By: Anner Crete M.D.   On: 08/18/2019 00:50   CT Angio Chest PE W and/or Wo Contrast  Result Date: 08/06/2019 CLINICAL DATA:  Hemoptysis, history of breast cancer and COVID EXAM: CT ANGIOGRAPHY CHEST WITH CONTRAST TECHNIQUE: Multidetector CT imaging of the chest was performed using the standard protocol during bolus administration of intravenous contrast. Multiplanar CT image reconstructions and MIPs were obtained to evaluate the vascular anatomy. CONTRAST:  59m OMNIPAQUE IOHEXOL 350 MG/ML SOLN COMPARISON:  July 09, 2019 FINDINGS: Cardiovascular: There is a  optimal opacification of the pulmonary arteries. There is no central,segmental, or subsegmental filling defects within the pulmonary arteries. A left-sided MediPort catheter is seen with the tip in the right atrium. The heart is normal in size. No pericardial effusion or thickening. No evidence right heart strain. There is normal three-vessel brachiocephalic anatomy without proximal stenosis. Scattered mild aortic atherosclerosis. Mediastinum/Nodes: No hilar, mediastinal, or axillary adenopathy. Thyroid gland, trachea, and esophagus demonstrate no significant findings. Lungs/Pleura: There is new/worsening multifocal patchy predominantly peripherally based ground-glass opacities seen throughout both lungs. There is also slight interval worsening in the tree-in-bud nodular hazy opacities within the left lung apex. There is unchanged spiculated masslike consolidation within the right infrahilar region, likely from post treatment changes/fibrosis. There is a trace right pleural effusion present. Upper Abdomen: No acute abnormalities present in the visualized portions of the upper abdomen. Musculoskeletal: No chest wall abnormality. No acute or significant osseous findings. Again noted is diffuse skin thickening seen along the right breast. There is a surgical clips within the right breast. Review of the MIP images confirms the above findings. IMPRESSION: 1. No central, segmental, or subsegmental pulmonary embolism. 2. Interval worsening of the multifocal patchy ground-glass opacities throughout both lungs, consistent with multifocal pneumonia. 3. Interval slight worsening in the tree-in-bud nodular opacities within the left upper lobe, likely due to atypical infectious etiology, however cannot exclude underlying metastatic disease. 4. Post treatment radiation/fibrotic changes in the right infrahilar region. 5. Trace right pleural effusion Electronically Signed   By:  Prudencio Pair M.D.   On: 08/06/2019 00:17   DG Chest  Port 1 View  Result Date: 08/14/2019 CLINICAL DATA:  Acute respiratory failure. EXAM: PORTABLE CHEST 1 VIEW COMPARISON:  Chest x-ray 08/13/2019 FINDINGS: The left subclavian power port is stable. Stable radiation changes involving the right hilum and right paramediastinal lung. Persistent patchy nodular infiltrates and small right effusion. No pneumothorax. IMPRESSION: Stable chest x-ray. Persistent patchy nodular infiltrates and small right effusion. Electronically Signed   By: Marijo Sanes M.D.   On: 08/14/2019 06:19   DG Chest Port 1 View  Result Date: 08/13/2019 CLINICAL DATA:  Shortness of breath, tachypnea with decreased oxygen saturations., history of breast cancer. EXAM: PORTABLE CHEST 1 VIEW COMPARISON:  08/06/2019, CT of the chest. FINDINGS: Masslike appearance of right hilum similar to previous exam. Cardiomediastinal contours are stable. Left Port-A-Cath terminates at the caval to atrial junction. Nodular opacities are again suggested and there is increase in interstitial prominence when compared to the chest x-ray from 08/05/2019. Blunting of right costophrenic angles suggest scarring or small effusion. No signs of dense consolidation aside from perihilar changes discussed above. Visualized skeletal structures without acute bone finding. IMPRESSION: Masslike appearance of right hilum similar to previous exam. Nodular opacities are again suggested and there is increase in interstitial prominence when compared to the chest x-ray from 08/05/2019. Findings may represent worsening of pulmonary parenchymal disease, associated with previously reported COVID-19 infection, particularly in the left lung base, potentially with a background of pulmonary edema. Electronically Signed   By: Zetta Bills M.D.   On: 08/13/2019 15:26   DG Chest Portable 1 View  Result Date: 08/05/2019 CLINICAL DATA:  59 year old female with hemoptysis. History of breast cancer. EXAM: PORTABLE CHEST 1 VIEW COMPARISON:  Chest  radiograph dated 04/09/2017. CT dated 07/09/2019. FINDINGS: Port-A-Cath with tip in the region of the cavoatrial junction. Right hilar density corresponding to the post treatment/post radiation fibrosis seen on the prior CT. Diffuse interstitial and vascular prominence with scattered faint reticulonodular densities throughout the lungs. Probable small bilateral pleural effusions. No pneumothorax. The cardiac silhouette is within normal limits. Atherosclerotic calcification of the aorta. No acute osseous pathology. IMPRESSION: Right hilar post radiation fibrosis and scattered bilateral reticulonodular densities and small bilateral pleural effusions. Overall findings are similar to the CT of 07/09/2019. Electronically Signed   By: Anner Crete M.D.   On: 08/05/2019 22:59   ECHOCARDIOGRAM COMPLETE  Result Date: 08/14/2019    ECHOCARDIOGRAM REPORT   Patient Name:   DALMA PANCHAL Date of Exam: 08/14/2019 Medical Rec #:  [id number]          Height:       63.0 in Accession #:    [id number]         Weight:       198.0 lb Date of Birth:  Oct 05, 1960         BSA:          1.925 m Patient Age:    55 years           BP:           105/71 mmHg Patient Gender: F                  HR:           95 bpm. Exam Location:  [facility] Procedure: 2D Echo, Cardiac Doppler and Color Doppler Indications:     Dyspnea 786.09  History:         Patient  has prior history of Echocardiogram examinations, most                  recent 04/22/2019. Breast cancer, covid-19, history of                  chemotherapy.  Sonographer:     [name] RDCS (AE) Referring Phys:  [id number] [facility] AMIN Diagnosing Phys: Serafina Royals MD  Sonographer Comments: Suboptimal apical window. IMPRESSIONS  1. Left ventricular ejection fraction, by estimation, is 55 to 60%. The left ventricle has normal function. The left ventricle has no regional wall motion abnormalities. Left ventricular diastolic parameters were normal.  2. Right ventricular systolic function is  normal. The right ventricular size is normal. There is normal pulmonary artery systolic pressure.  3. The mitral valve is normal in structure. Trivial mitral valve regurgitation.  4. The aortic valve is normal in structure. Aortic valve regurgitation is not visualized. FINDINGS  Left Ventricle: Left ventricular ejection fraction, by estimation, is 55 to 60%. The left ventricle has normal function. The left ventricle has no regional wall motion abnormalities. The left ventricular internal cavity size was normal in size. There is  no left ventricular hypertrophy. Left ventricular diastolic parameters were normal. Right Ventricle: The right ventricular size is normal. No increase in right ventricular wall thickness. Right ventricular systolic function is normal. There is normal pulmonary artery systolic pressure. The tricuspid regurgitant velocity is 1.87 m/s, and  with an assumed right atrial pressure of 10 mmHg, the estimated right ventricular systolic pressure is 44.0 mmHg. Left Atrium: Left atrial size was normal in size. Right Atrium: Right atrial size was normal in size. Pericardium: There is no evidence of pericardial effusion. Mitral Valve: The mitral valve is normal in structure. Trivial mitral valve regurgitation. Tricuspid Valve: The tricuspid valve is normal in structure. Tricuspid valve regurgitation is trivial. Aortic Valve: The aortic valve is normal in structure. Aortic valve regurgitation is not visualized. Aortic valve mean gradient measures 2.0 mmHg. Aortic valve peak gradient measures 3.3 mmHg. Aortic valve area, by VTI measures 3.78 cm. Pulmonic Valve: The pulmonic valve was normal in structure. Pulmonic valve regurgitation is not visualized. Aorta: The aortic root and ascending aorta are structurally normal, with no evidence of dilitation. IAS/Shunts: No atrial level shunt detected by color flow Doppler.  LEFT VENTRICLE PLAX 2D LVIDd:         3.86 cm  Diastology LVIDs:         2.43 cm  LV e'  lateral:   9.79 cm/s LV PW:         0.82 cm  LV E/e' lateral: 6.0 LV IVS:        0.82 cm  LV e' medial:    6.96 cm/s LVOT diam:     2.00 cm  LV E/e' medial:  8.4 LV SV:         44 LV SV Index:   23 LVOT Area:     3.14 cm  RIGHT VENTRICLE RV Basal diam:  3.94 cm TAPSE (M-mode): 3.5 cm LEFT ATRIUM           Index       RIGHT ATRIUM           Index LA diam:      2.00 cm 1.04 cm/m  RA Area:     19.40 cm LA Vol (A2C): 24.8 ml 12.88 ml/m RA Volume:   58.80 ml  30.54 ml/m LA Vol (A4C): 24.8 ml 12.88 ml/m  AORTIC VALVE                   PULMONIC VALVE AV Area (Vmax):    3.11 cm    PV Vmax:        0.48 m/s AV Area (Vmean):   3.48 cm    PV Peak grad:   0.9 mmHg AV Area (VTI):     3.78 cm    RVOT Peak grad: 1 mmHg AV Vmax:           90.35 cm/s AV Vmean:          62.550 cm/s AV VTI:            0.115 m AV Peak Grad:      3.3 mmHg AV Mean Grad:      2.0 mmHg LVOT Vmax:         89.40 cm/s LVOT Vmean:        69.200 cm/s LVOT VTI:          0.139 m LVOT/AV VTI ratio: 1.20  AORTA Ao Root diam: 2.50 cm MITRAL VALVE               TRICUSPID VALVE MV Area (PHT): 8.62 cm    TR Peak grad:   14.0 mmHg MV Decel Time: 88 msec     TR Vmax:        187.00 cm/s MV E velocity: 58.70 cm/s MV A velocity: 86.80 cm/s  SHUNTS MV E/A ratio:  0.68        Systemic VTI:  0.14 m                            Systemic Diam: 2.00 cm Serafina Royals MD Electronically signed by Serafina Royals MD Signature Date/Time: 08/14/2019/1:54:24 PM    Final     PERFORMANCE STATUS (ECOG) : 3 - Symptomatic, >50% confined to bed  Review of Systems Unless otherwise noted, a complete review of systems is negative.  Physical Exam General: NAD Cardiovascular: regular rate and rhythm Pulmonary: coarse ant/post fields, on O2 Abdomen: soft, nontender, + bowel sounds GU: no suprapubic tenderness Extremities: no edema, no joint deformities Skin: no rashes Neurological: Weakness but otherwise nonfocal  IMPRESSION: Patient remains in ICU. On 5L O2. She says her  breathing is overall improved. She reports tolerating and finding the lorazepam to be helpful when it was tried yesterday. She has not yet had morphine.   Discussed with husband/patient. They remain in agreement with the current treatment plan. There is discussion of possible BAL later this week.   PLAN: -Continue current scope of treatment -Full scope/full code -Morphine/lorazepam prn for comfort -Will follow.    Time Total: 15 minutes  Visit consisted of counseling and education dealing with the complex and emotionally intense issues of symptom management and palliative care in the setting of serious and potentially life-threatening illness.Greater than 50%  of this time was spent counseling and coordinating care related to the above assessment and plan.  Signed by: Altha Harm, PhD, NP-C

## 2019-08-20 NOTE — Progress Notes (Signed)
Patient Name: Ashley Molina Date of Encounter: 08/20/2019  Hospital Problem List     Active Problems:   COPD exacerbation [facility])   Palliative care encounter    Patient Profile      59 y.o. female with history of breast carcinoma with lung metastasis status post lumpectomy, chemotherapy and radiation therapy, history of COVID-19 in December 2020, history of dyslipidemia, anemia admitted with increasing shortness of breath and fatigue.  She was recently discharged from [facility] on March 10 with acute on chronic respiratory failure requiring BiPAP.  She felt worse after discharge and return.  In the emergency room she was tachycardic with EKG showing A. fib.  BNP was 400.  High-sensitivity troponin drawn per protocol was 98.  Electrocardiogram showed sinus tachycardia with no ischemia..  Chest x-ray suggested possible pulmonary edema.   Subjective   Still somewhat sob. Heart rate still somewhat elevated.   Inpatient Medications    . aspirin EC  81 mg Oral Daily  . budesonide  0.5 mg Nebulization BID  . Chlorhexidine Gluconate Cloth  6 each Topical Daily  . cholecalciferol  1,000 Units Oral QHS  . diltiazem  30 mg Oral Q6H  . enoxaparin (LOVENOX) injection  40 mg Subcutaneous Q24H  . ferrous sulfate  325 mg Oral QHS  . furosemide  40 mg Intravenous BID  . ipratropium  0.5 mg Nebulization Q6H  . ipratropium-albuterol  3 mL Nebulization Once  . levalbuterol  0.63 mg Nebulization Q6H  . methylPREDNISolone (SOLU-MEDROL) injection  40 mg Intravenous Q12H  . metoprolol tartrate  25 mg Oral Once  . multivitamin with minerals  1 tablet Oral QHS  . omega-3 acid ethyl esters  1 g Oral Daily  . pantoprazole  20 mg Oral Daily  . sodium chloride flush  3 mL Intravenous Q12H  . sulfamethoxazole-trimethoprim  1 tablet Oral Once per day on Mon Wed Fri    Vital Signs    Vitals:   08/20/19 0748 08/20/19 0800 08/20/19 0900 08/20/19 1000  BP:  111/77 103/62 113/80  Pulse:  (!) 102 (!) 119 (!)  101  Resp:  11 (!) 24 (!) 24  Temp:  98.3 F (36.8 C)    TempSrc:  Oral    SpO2: 92% 99% 95% 96%  Weight:      Height:        Intake/Output Summary (Last 24 hours) at 08/20/2019 1036 Last data filed at 08/20/2019 0932 Gross per 24 hour  Intake 950 ml  Output 2650 ml  Net -1700 ml   Filed Weights   08/18/19 1053 08/19/19 0500 08/20/19 0500  Weight: 86 kg 86.3 kg 92.5 kg    Physical Exam    GEN: Well nourished, well developed, in no acute distress.  HEENT: normal.  Neck: Supple, no JVD, carotid bruits, or masses. Cardiac: Tachycardia, no murmurs, rubs, or gallops. No clubbing, cyanosis, edema.  Radials/DP/PT 2+ and equal bilaterally.  Respiratory:  Respirations unlaborred but with bilateral rhonchi and wheezes.  GI: Soft, nontender, nondistended, BS + x 4. MS: no deformity or atrophy. Skin: warm and dry, no rash. Neuro:  Strength and sensation are intact. Psych: Normal affect.  Labs    CBC Recent Labs    08/19/19 0444 08/20/19 0624  WBC 14.7* 12.9*  NEUTROABS 13.3* 12.0*  HGB 12.1 12.1  HCT 39.0 36.8  MCV 91.5 89.5  PLT 186 0000000   Basic Metabolic Panel Recent Labs    08/19/19 0444 08/20/19 0624  NA 141 138  K 3.9 3.2*  CL 96* 93*  CO2 31 32  GLUCOSE 142* 140*  BUN 32* 34*  CREATININE 0.81 0.72  CALCIUM 10.1 9.7  MG 2.4 2.3  PHOS 4.3 3.9   Liver Function Tests Recent Labs    08/19/19 0444 08/20/19 0624  AST 20 21  ALT 23 25  ALKPHOS 88 80  BILITOT 0.8 1.0  PROT 6.3* 6.0*  ALBUMIN 3.4* 3.3*   No results for input(s): LIPASE, AMYLASE in the last 72 hours. Cardiac Enzymes No results for input(s): CKTOTAL, CKMB, CKMBINDEX, TROPONINI in the last 72 hours. BNP Recent Labs    08/18/19 0045 08/19/19 0444  BNP 400.0* 291.0*   D-Dimer No results for input(s): DDIMER in the last 72 hours. Hemoglobin A1C No results for input(s): HGBA1C in the last 72 hours. Fasting Lipid Panel No results for input(s): CHOL, HDL, LDLCALC, TRIG, CHOLHDL,  LDLDIRECT in the last 72 hours. Thyroid Function Tests No results for input(s): TSH, T4TOTAL, T3FREE, THYROIDAB in the last 72 hours.  Invalid input(s): FREET3  Telemetry    Sinus tachycadia  ECG    tachycardia  Radiology    DG Chest 1 View  Result Date: 08/18/2019 CLINICAL DATA:  59 year old female with shortness of breath. EXAM: CHEST  1 VIEW COMPARISON:  Chest radiograph dated 08/14/2019 and CT dated 08/06/2019 FINDINGS: Right hilar post treatment changes. There is diffuse interstitial prominence and lower lung field hazy densities likely representing edema. Pneumonia is not excluded. Clinical correlation is recommended. There is no pneumothorax. There is a small right pleural effusion. Stable cardiac silhouette. Left pectoral Port-A-Cath with tip close to the cavoatrial junction. Atherosclerotic calcification of the aorta. No acute osseous pathology. IMPRESSION: Findings likely representing edema. Pneumonia is not excluded. Electronically Signed   By: Anner Crete M.D.   On: 08/18/2019 00:50   CT Angio Chest PE W and/or Wo Contrast  Result Date: 08/06/2019 CLINICAL DATA:  Hemoptysis, history of breast cancer and COVID EXAM: CT ANGIOGRAPHY CHEST WITH CONTRAST TECHNIQUE: Multidetector CT imaging of the chest was performed using the standard protocol during bolus administration of intravenous contrast. Multiplanar CT image reconstructions and MIPs were obtained to evaluate the vascular anatomy. CONTRAST:  35mL OMNIPAQUE IOHEXOL 350 MG/ML SOLN COMPARISON:  July 09, 2019 FINDINGS: Cardiovascular: There is a optimal opacification of the pulmonary arteries. There is no central,segmental, or subsegmental filling defects within the pulmonary arteries. A left-sided MediPort catheter is seen with the tip in the right atrium. The heart is normal in size. No pericardial effusion or thickening. No evidence right heart strain. There is normal three-vessel brachiocephalic anatomy without proximal  stenosis. Scattered mild aortic atherosclerosis. Mediastinum/Nodes: No hilar, mediastinal, or axillary adenopathy. Thyroid gland, trachea, and esophagus demonstrate no significant findings. Lungs/Pleura: There is new/worsening multifocal patchy predominantly peripherally based ground-glass opacities seen throughout both lungs. There is also slight interval worsening in the tree-in-bud nodular hazy opacities within the left lung apex. There is unchanged spiculated masslike consolidation within the right infrahilar region, likely from post treatment changes/fibrosis. There is a trace right pleural effusion present. Upper Abdomen: No acute abnormalities present in the visualized portions of the upper abdomen. Musculoskeletal: No chest wall abnormality. No acute or significant osseous findings. Again noted is diffuse skin thickening seen along the right breast. There is a surgical clips within the right breast. Review of the MIP images confirms the above findings. IMPRESSION: 1. No central, segmental, or subsegmental pulmonary embolism. 2. Interval worsening of the multifocal patchy ground-glass opacities throughout both  lungs, consistent with multifocal pneumonia. 3. Interval slight worsening in the tree-in-bud nodular opacities within the left upper lobe, likely due to atypical infectious etiology, however cannot exclude underlying metastatic disease. 4. Post treatment radiation/fibrotic changes in the right infrahilar region. 5. Trace right pleural effusion Electronically Signed   By: Prudencio Pair M.D.   On: 08/06/2019 00:17   DG Chest Port 1 View  Result Date: 08/14/2019 CLINICAL DATA:  Acute respiratory failure. EXAM: PORTABLE CHEST 1 VIEW COMPARISON:  Chest x-ray 08/13/2019 FINDINGS: The left subclavian power port is stable. Stable radiation changes involving the right hilum and right paramediastinal lung. Persistent patchy nodular infiltrates and small right effusion. No pneumothorax. IMPRESSION: Stable chest  x-ray. Persistent patchy nodular infiltrates and small right effusion. Electronically Signed   By: Marijo Sanes M.D.   On: 08/14/2019 06:19   DG Chest Port 1 View  Result Date: 08/13/2019 CLINICAL DATA:  Shortness of breath, tachypnea with decreased oxygen saturations., history of breast cancer. EXAM: PORTABLE CHEST 1 VIEW COMPARISON:  08/06/2019, CT of the chest. FINDINGS: Masslike appearance of right hilum similar to previous exam. Cardiomediastinal contours are stable. Left Port-A-Cath terminates at the caval to atrial junction. Nodular opacities are again suggested and there is increase in interstitial prominence when compared to the chest x-ray from 08/05/2019. Blunting of right costophrenic angles suggest scarring or small effusion. No signs of dense consolidation aside from perihilar changes discussed above. Visualized skeletal structures without acute bone finding. IMPRESSION: Masslike appearance of right hilum similar to previous exam. Nodular opacities are again suggested and there is increase in interstitial prominence when compared to the chest x-ray from 08/05/2019. Findings may represent worsening of pulmonary parenchymal disease, associated with previously reported COVID-19 infection, particularly in the left lung base, potentially with a background of pulmonary edema. Electronically Signed   By: Zetta Bills M.D.   On: 08/13/2019 15:26   DG Chest Portable 1 View  Result Date: 08/05/2019 CLINICAL DATA:  59 year old female with hemoptysis. History of breast cancer. EXAM: PORTABLE CHEST 1 VIEW COMPARISON:  Chest radiograph dated 04/09/2017. CT dated 07/09/2019. FINDINGS: Port-A-Cath with tip in the region of the cavoatrial junction. Right hilar density corresponding to the post treatment/post radiation fibrosis seen on the prior CT. Diffuse interstitial and vascular prominence with scattered faint reticulonodular densities throughout the lungs. Probable small bilateral pleural effusions. No  pneumothorax. The cardiac silhouette is within normal limits. Atherosclerotic calcification of the aorta. No acute osseous pathology. IMPRESSION: Right hilar post radiation fibrosis and scattered bilateral reticulonodular densities and small bilateral pleural effusions. Overall findings are similar to the CT of 07/09/2019. Electronically Signed   By: Anner Crete M.D.   On: 08/05/2019 22:59   ECHOCARDIOGRAM COMPLETE  Result Date: 08/14/2019    ECHOCARDIOGRAM REPORT   Patient Name:   ADJA DADISMAN Date of Exam: 08/14/2019 Medical Rec #:  [id number]          Height:       63.0 in Accession #:    [id number]         Weight:       198.0 lb Date of Birth:  12/31/1960         BSA:          1.925 m Patient Age:    58 years           BP:           105/71 mmHg Patient Gender: F  HR:           95 bpm. Exam Location:  [facility] Procedure: 2D Echo, Cardiac Doppler and Color Doppler Indications:     Dyspnea 786.09  History:         Patient has prior history of Echocardiogram examinations, most                  recent 04/22/2019. Breast cancer, covid-19, history of                  chemotherapy.  Sonographer:     [name] RDCS (AE) Referring Phys:  [id number] [facility] AMIN Diagnosing Phys: Serafina Royals MD  Sonographer Comments: Suboptimal apical window. IMPRESSIONS  1. Left ventricular ejection fraction, by estimation, is 55 to 60%. The left ventricle has normal function. The left ventricle has no regional wall motion abnormalities. Left ventricular diastolic parameters were normal.  2. Right ventricular systolic function is normal. The right ventricular size is normal. There is normal pulmonary artery systolic pressure.  3. The mitral valve is normal in structure. Trivial mitral valve regurgitation.  4. The aortic valve is normal in structure. Aortic valve regurgitation is not visualized. FINDINGS  Left Ventricle: Left ventricular ejection fraction, by estimation, is 55 to 60%. The left ventricle has normal  function. The left ventricle has no regional wall motion abnormalities. The left ventricular internal cavity size was normal in size. There is  no left ventricular hypertrophy. Left ventricular diastolic parameters were normal. Right Ventricle: The right ventricular size is normal. No increase in right ventricular wall thickness. Right ventricular systolic function is normal. There is normal pulmonary artery systolic pressure. The tricuspid regurgitant velocity is 1.87 m/s, and  with an assumed right atrial pressure of 10 mmHg, the estimated right ventricular systolic pressure is 0000000 mmHg. Left Atrium: Left atrial size was normal in size. Right Atrium: Right atrial size was normal in size. Pericardium: There is no evidence of pericardial effusion. Mitral Valve: The mitral valve is normal in structure. Trivial mitral valve regurgitation. Tricuspid Valve: The tricuspid valve is normal in structure. Tricuspid valve regurgitation is trivial. Aortic Valve: The aortic valve is normal in structure. Aortic valve regurgitation is not visualized. Aortic valve mean gradient measures 2.0 mmHg. Aortic valve peak gradient measures 3.3 mmHg. Aortic valve area, by VTI measures 3.78 cm. Pulmonic Valve: The pulmonic valve was normal in structure. Pulmonic valve regurgitation is not visualized. Aorta: The aortic root and ascending aorta are structurally normal, with no evidence of dilitation. IAS/Shunts: No atrial level shunt detected by color flow Doppler.  LEFT VENTRICLE PLAX 2D LVIDd:         3.86 cm  Diastology LVIDs:         2.43 cm  LV e' lateral:   9.79 cm/s LV PW:         0.82 cm  LV E/e' lateral: 6.0 LV IVS:        0.82 cm  LV e' medial:    6.96 cm/s LVOT diam:     2.00 cm  LV E/e' medial:  8.4 LV SV:         44 LV SV Index:   23 LVOT Area:     3.14 cm  RIGHT VENTRICLE RV Basal diam:  3.94 cm TAPSE (M-mode): 3.5 cm LEFT ATRIUM           Index       RIGHT ATRIUM           Index LA diam:  2.00 cm 1.04 cm/m  RA Area:      19.40 cm LA Vol (A2C): 24.8 ml 12.88 ml/m RA Volume:   58.80 ml  30.54 ml/m LA Vol (A4C): 24.8 ml 12.88 ml/m  AORTIC VALVE                   PULMONIC VALVE AV Area (Vmax):    3.11 cm    PV Vmax:        0.48 m/s AV Area (Vmean):   3.48 cm    PV Peak grad:   0.9 mmHg AV Area (VTI):     3.78 cm    RVOT Peak grad: 1 mmHg AV Vmax:           90.35 cm/s AV Vmean:          62.550 cm/s AV VTI:            0.115 m AV Peak Grad:      3.3 mmHg AV Mean Grad:      2.0 mmHg LVOT Vmax:         89.40 cm/s LVOT Vmean:        69.200 cm/s LVOT VTI:          0.139 m LVOT/AV VTI ratio: 1.20  AORTA Ao Root diam: 2.50 cm MITRAL VALVE               TRICUSPID VALVE MV Area (PHT): 8.62 cm    TR Peak grad:   14.0 mmHg MV Decel Time: 88 msec     TR Vmax:        187.00 cm/s MV E velocity: 58.70 cm/s MV A velocity: 86.80 cm/s  SHUNTS MV E/A ratio:  0.68        Systemic VTI:  0.14 m                            Systemic Diam: 2.00 cm Serafina Royals MD Electronically signed by Serafina Royals MD Signature Date/Time: 08/14/2019/1:54:24 PM    Final     Assessment & Plan    59 y.o.femalewith history ofbreast carcinoma with lung metastasis status post lumpectomy, chemotherapy and radiation therapy, history of COVID-19 in December 2020, history of dyslipidemia, anemia admitted with increasing shortness of breath and fatigue. She was recently discharged from [facility] on March 10 with acute on chronic respiratory failure requiring BiPAP. She felt worse after discharge and return. In the emergency room she was tachycardic with EKG showing A. fib. BNP was 400. High-sensitivity troponin drawn per protocol was 98. Electrocardiogram showed sinus tachycardia with no ischemia.. Chest x-ray suggested possible pulmonary edema. Pneumonia was not excluded. She had an echocardiogram which was read as showing normal LV function EF 55 to 60% with no regional wall motion abnormalities. Right ventricular function was normal. There was trivial MR and TR.  Comparison of echo done in 2019 and 11 of 2020 showed no change. BNP on admission was 400.  1.  Tachycardia-appears to be sinus tachycardia.  Rate is improved control currently after changing to diltiazem 30 q 6 although is still somewhat tachycardic.  Some of this may be situational given her pulmonary status.  Will attempt to increasse the cardizem to 60 q 6 and follow.   Will need to carefully follow her blood pressure as her pressure is somewhat soft.  2.  Respiratory distress-likely secondary to metastatic disease as well as reactive airway disease.  Would continue with bronchodilators as  you are doing.  Will attempt to control heart rate.  Echo shows preserved LV function.  3.  Elevated troponin-appears to be demand.  Does not appear to represent acute ischemic syndrome as she is asymptomatic from this and has no ischemia on electrocardiogram  4.  Congestive heart failure-chest x-ray suggest possible mild pulmonary edema.  BNP is mildly elevated at 291.  We will continue with careful diuresis following renal function and hemodynamics.  Not a candidate for afterload reduction given for HFpEF and soft blood pressure   Signed, Javier Docker. Treyon Wymore MD 08/20/2019, 10:36 AM  Pager: [phone]

## 2019-08-20 NOTE — Progress Notes (Signed)
PROGRESS NOTE    [name]  MHD:[id number] DOB: [birth year] DOA: 08/17/2019 PCP: Patient, No Pcp Per   Brief Narrative:  Ashley Molina  is a 59 y.o. Caucasian female with a known history of breast cancer with lung metastasis status post lumpectomy, chemotherapy and radiotherapy, COVID-19 in December 2020, dyslipidemia and anemia, who presented to [facility] worsening dyspnea with associated cough with inability to expectorate as well as wheezing today.  The patient was just discharged from here on 3/10 after being treated for acute on chronic respiratory failure requiring BiPAP.  No nausea or vomiting or abdominal pain.  She denies any chest pain or palpitations.  No fever or chills.  No dysuria, oliguria or hematuria or flank pain.  Upon presentation to the emergency room, heart rate was 111 has gone up to 142 with otherwise normal vital signs.  EKG showed atrial fibrillation/flutter with a rate of 165.  Respiratory clear unless 25-28.  Labs revealed potassium of 3.6 magnesium 2.2 anion gap of 15.  BNP was 400.  High-sensitivity troponin I was 38 and later 98 and CBC showed leukocytosis of 13.9.  Influenza antigens and COVID-19 PCR came back negative.  Chest x-ray showed findings representing pulmonary edema with pneumonia not excluded.  She's currently admitted for COPD exacerbation and is being treated with lasix for pulmonary edema/heart failure as well.   Assessment & Plan:   Active Problems:   COPD exacerbation ([place])   Palliative care encounter  1.  Acute Hypoxic Respiratory Failure  COPD acute exacerbation with subsequent acute cor pulmonale with mild interstitial pulmonary edema. - CXR 3/15 with edema - Steroids, scheduled and prn nebs, pulmicort - IV lasix  -She will be placed on scheduled and as needed duo nebs. 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  Hx of covid and radiation induced pneumonitis as well.  Multiple possible contributing factors.  Will c/s pulm to follow.  2. Sinus Tachycardia with PAC vs Fib/Flutter  Elevated Troponin - some question of possible afib/flutter, though suspect this is sinus tach with PAC's - will consult cardiology with elevated troponin and tachyarrythmia, suspect this is demand ischemia - recent echo with normal EF -continue metop  3.  GERD. -PPI therapy will be resumed.  4.  Hypertension. -Beta-blocker therapy will be resumed.  5.  DVT prophylaxis. -Subcutaneous Lovenox  DVT prophylaxis: lovenox Code Status: full Family Communication: none at bedside Disposition Plan:  . Patient came from: home            . Anticipated d/c place: home . Barriers to d/c OR conditions which need to be met to effect a safe d/c: pending improvement in resp status  Consultants:   oncology  Procedures:   none  Antimicrobials:  Anti-infectives (From admission, onward)   Start     Dose/Rate Route Frequency Ordered Stop   08/18/19 0900  sulfamethoxazole-trimethoprim (BACTRIM DS) 800-160 MG per tablet 1 tablet     1 tablet Oral Once per day on Mon Wed Fri 08/18/19 0622     08/18/19 0700  cefTRIAXone (ROCEPHIN) 2 g in sodium chloride 0.9 % 100 mL IVPB     2 g 200 mL/hr over 30 Minutes Intravenous Every 24 hours 08/18/19 0647     08/18/19 0700  azithromycin (ZITHROMAX) 500 mg in sodium chloride 0.9 % 250 mL IVPB     500 mg  250 mL/hr over 60 Minutes Intravenous Every 24 hours 08/18/19 0647       Subjective: SOB improved today.  08/20/2019: Blood pressure (!) 102/54, pulse 87, temperature 98.9 F (37.2 C), temperature source Oral, resp. rate 20, height '5\' 3"'$  (1.6 m), weight 92.5 kg, last menstrual period 04/04/2012, SpO2 95 %.  Pt is alert awake and coughing after taking deep breaths. She has IS at bedside.  She Denies any other complaints.     Objective: Vitals:   08/20/19 0400 08/20/19 0500 08/20/19 0600 08/20/19 0700  BP: (!) 89/59 104/70 105/68 101/66  Pulse: 99 (!) 103 99 96  Resp: (!) 21 (!) '30 19 20  '$ Temp: 98 F (36.7 C)     TempSrc:      SpO2: 93% 95% 92% 91%  Weight:  92.5 kg    Height:        Intake/Output Summary (Last 24 hours) at 08/20/2019 0741 Last data filed at 08/20/2019 0606 Gross per 24 hour  Intake 1030.28 ml  Output 2000 ml  Net -969.72 ml   Filed Weights   08/18/19 1053 08/19/19 0500 08/20/19 0500  Weight: 86 kg 86.3 kg 92.5 kg    Examination: Blood pressure (!) 102/54, pulse 87, temperature 98.9 F (37.2 C), temperature source Oral, resp. rate 20, height '5\' 3"'$  (1.6 m), weight 92.5 kg, last menstrual period 04/04/2012, SpO2 95 %. General: No acute distress. Cardiovascular: Heart sounds show a tachycardic rate Lungs: diffuse ins/exp wheezing all throughout lung field.  Abdomen: Soft, nontender, nondistended Neurological: Alert and oriented 3. Moves all extremities 4. Cranial nerves II through XII grossly intact. Skin: Warm and dry. No rashes or lesions. Extremities: No clubbing or cyanosis.   Data Reviewed:   CBC: Recent Labs  Lab 08/13/19 1502 08/14/19 0517 08/15/19 0639 08/16/19 0601 08/18/19 0004 08/19/19 0444 08/20/19 0624  WBC 17.1*   < > 10.0 10.4 13.9* 14.7* 12.9*  NEUTROABS 15.7*  --   --   --   --  13.3* 12.0*  HGB 12.9   < > 11.7* 11.6* 13.5 12.1 12.1  HCT 41.4   < > 38.0 36.5 44.2 39.0 36.8  MCV 92.2   < > 92.0 91.5 94.2 91.5 89.5  PLT 307   < > 201 191 181 186 188   < > = values in this interval not displayed.   Basic Metabolic Panel: Recent Labs  Lab 08/15/19 0639 08/16/19 0601 08/18/19 0045 08/19/19 0444 08/20/19 0624  NA 136 136 139 141 138  K 3.6 4.0 3.6 3.9 3.2*  CL 99 97* 96* 96* 93*  CO2 '27 30 28 31 '$ 32  GLUCOSE 129* 129* 131* 142* 140*  BUN 32* 25* 30* 32* 34*  CREATININE 0.89 0.74 0.74 0.81 0.72  CALCIUM 9.5 9.3 9.9 10.1 9.7  MG 2.1 2.2  2.2 2.4 2.3  PHOS  --  3.4  --  4.3 3.9   GFR: Estimated Creatinine Clearance: 80.8 mL/min (by C-G formula based on SCr of 0.72 mg/dL). Liver Function Tests: Recent Labs  Lab 08/13/19 1502 08/18/19 0045 08/19/19 0444 08/20/19 0624  AST '30 26 20 21  '$ ALT 39 '26 23 25  '$ ALKPHOS 111 105 88 80  BILITOT 0.6 0.9 0.8 1.0  PROT 6.9 6.9 6.3* 6.0*  ALBUMIN 3.9 4.0 3.4* 3.3*   No results for input(s): LIPASE, AMYLASE in the last 168 hours. No results for input(s): AMMONIA in the last 168 hours. Coagulation Profile: No results for input(s): INR, PROTIME in the last  168 hours. Cardiac Enzymes: No results for input(s): CKTOTAL, CKMB, CKMBINDEX, TROPONINI in the last 168 hours. BNP (last 3 results) No results for input(s): PROBNP in the last 8760 hours. HbA1C: No results for input(s): HGBA1C in the last 72 hours. CBG: Recent Labs  Lab 08/13/19 2035 08/18/19 1039  GLUCAP 137* 128*   Lipid Profile: No results for input(s): CHOL, HDL, LDLCALC, TRIG, CHOLHDL, LDLDIRECT in the last 72 hours. Thyroid Function Tests: No results for input(s): TSH, T4TOTAL, FREET4, T3FREE, THYROIDAB in the last 72 hours. Anemia Panel: No results for input(s): VITAMINB12, FOLATE, FERRITIN, TIBC, IRON, RETICCTPCT in the last 72 hours. Sepsis Labs: Recent Labs  Lab 08/13/19 1502 08/13/19 2112 08/14/19 0739 08/14/19 0811 08/19/19 0444  PROCALCITON  --  <0.10  --   --  <0.10  LATICACIDVEN 2.1* 2.0* 1.4 1.5  --     Recent Results (from the past 240 hour(s))  Blood Culture (routine x 2)     Status: None   Collection Time: 08/13/19  3:02 PM   Specimen: BLOOD  Result Value Ref Range Status   Specimen Description BLOOD BLOOD LEFT HAND  Final   Special Requests   Final    BOTTLES DRAWN AEROBIC AND ANAEROBIC Blood Culture adequate volume   Culture   Final    NO GROWTH 5 DAYS Performed at [facility], [address], [place], [place] [address]    Report Status 08/18/2019 FINAL  Final  Blood  Culture (routine x 2)     Status: None   Collection Time: 08/13/19  3:07 PM   Specimen: BLOOD  Result Value Ref Range Status   Specimen Description BLOOD LEFT ANTECUBITAL  Final   Special Requests   Final    BOTTLES DRAWN AEROBIC AND ANAEROBIC Blood Culture adequate volume   Culture   Final    NO GROWTH 5 DAYS Performed at [facility], [address], [place], [place] [address]    Report Status 08/18/2019 FINAL  Final  SARS CORONAVIRUS 2 (TAT 6-24 HRS) Nasopharyngeal Nasopharyngeal Swab     Status: None   Collection Time: 08/13/19  8:16 PM   Specimen: Nasopharyngeal Swab  Result Value Ref Range Status   SARS Coronavirus 2 NEGATIVE NEGATIVE Final    Comment: (NOTE) SARS-CoV-2 target nucleic acids are NOT DETECTED. The SARS-CoV-2 RNA is generally detectable in upper and lower respiratory specimens during the acute phase of infection. Negative results do not preclude SARS-CoV-2 infection, do not rule out co-infections with other pathogens, and should not be used as the sole basis for treatment or other patient management decisions. Negative results must be combined with clinical observations, patient history, and epidemiological information. The expected result is Negative. Fact Sheet for Patients: SugarRoll.be Fact Sheet for Healthcare Providers: https://www.woods-mathews.com/ This test is not yet approved or cleared by the [place] FDA and  has been authorized for detection and/or diagnosis of SARS-CoV-2 by FDA under an Emergency Use Authorization (EUA). This EUA will remain  in effect (meaning this test can be used) for the duration of the COVID-19 declaration under Section 56 4(b)(1) of the Act, 21 U.S.C. section 360bbb-3(b)(1), unless the authorization is terminated or revoked sooner. Performed at [facility], [place] [address], [place], [place] [address]   MRSA PCR Screening     Status: None   Collection Time:  08/13/19 11:28 PM   Specimen: Nasopharyngeal  Result Value Ref Range Status   MRSA by PCR NEGATIVE NEGATIVE Final    Comment:  The GeneXpert MRSA Assay (FDA approved for NASAL specimens only), is one component of a comprehensive MRSA colonization surveillance program. It is not intended to diagnose MRSA infection nor to guide or monitor treatment for MRSA infections. Performed at [facility], [address] [place], [place] [address]   Respiratory Panel by RT PCR (Flu A&B, Covid) - Nasopharyngeal Swab     Status: None   Collection Time: 08/18/19  2:23 AM   Specimen: Nasopharyngeal Swab  Result Value Ref Range Status   SARS Coronavirus 2 by RT PCR NEGATIVE NEGATIVE Final    Comment: (NOTE) SARS-CoV-2 target nucleic acids are NOT DETECTED. The SARS-CoV-2 RNA is generally detectable in upper respiratoy specimens during the acute phase of infection. The lowest concentration of SARS-CoV-2 viral copies this assay can detect is 131 copies/mL. A negative result does not preclude SARS-Cov-2 infection and should not be used as the sole basis for treatment or other patient management decisions. A negative result may occur with  improper specimen collection/handling, submission of specimen other than nasopharyngeal swab, presence of viral mutation(s) within the areas targeted by this assay, and inadequate number of viral copies (<131 copies/mL). A negative result must be combined with clinical observations, patient history, and epidemiological information. The expected result is Negative. Fact Sheet for Patients:  PinkCheek.be Fact Sheet for Healthcare Providers:  GravelBags.it This test is not yet ap proved or cleared by the [place] FDA and  has been authorized for detection and/or diagnosis of SARS-CoV-2 by FDA under an Emergency Use Authorization (EUA). This EUA will remain  in effect (meaning this test  can be used) for the duration of the COVID-19 declaration under Section 564(b)(1) of the Act, 21 U.S.C. section 360bbb-3(b)(1), unless the authorization is terminated or revoked sooner.    Influenza A by PCR NEGATIVE NEGATIVE Final   Influenza B by PCR NEGATIVE NEGATIVE Final    Comment: (NOTE) The Xpert Xpress SARS-CoV-2/FLU/RSV assay is intended as an aid in  the diagnosis of influenza from Nasopharyngeal swab specimens and  should not be used as a sole basis for treatment. Nasal washings and  aspirates are unacceptable for Xpert Xpress SARS-CoV-2/FLU/RSV  testing. Fact Sheet for Patients: PinkCheek.be Fact Sheet for Healthcare Providers: GravelBags.it This test is not yet approved or cleared by the [place] FDA and  has been authorized for detection and/or diagnosis of SARS-CoV-2 by  FDA under an Emergency Use Authorization (EUA). This EUA will remain  in effect (meaning this test can be used) for the duration of the  Covid-19 declaration under Section 564(b)(1) of the Act, 21  U.S.C. section 360bbb-3(b)(1), unless the authorization is  terminated or revoked. Performed at [facility], [address] [place], [place] [address]   CULTURE, BLOOD (ROUTINE X 2) w Reflex to ID Panel     Status: None (Preliminary result)   Collection Time: 08/18/19  8:19 AM   Specimen: BLOOD  Result Value Ref Range Status   Specimen Description BLOOD LEFT HAND   Final   Special Requests   Final    BOTTLES DRAWN AEROBIC AND ANAEROBIC Blood Culture adequate volume   Culture   Final    NO GROWTH < 24 HOURS Performed at [facility], [address] [place], [place] [address]    Report Status PENDING  Incomplete  CULTURE, BLOOD (ROUTINE X 2) w Reflex to ID Panel     Status: None (Preliminary result)   Collection Time: 08/18/19  8:24 AM   Specimen:  BLOOD  Result Value Ref Range Status   Specimen Description BLOOD  LEFT WRIST  Final   Special Requests   Final    BOTTLES DRAWN AEROBIC AND ANAEROBIC Blood Culture adequate volume   Culture   Final    NO GROWTH < 24 HOURS Performed at [facility], [address], [place], [place] [address]    Report Status PENDING  Incomplete     Radiology Studies: CTA chest: CLINICAL DATA:  Hemoptysis, history of breast cancer and COVID  EXAM: CT ANGIOGRAPHY CHEST WITH CONTRAST  TECHNIQUE: Multidetector CT imaging of the chest was performed using the standard protocol during bolus administration of intravenous contrast. Multiplanar CT image reconstructions and MIPs were obtained to evaluate the vascular anatomy.  CONTRAST:  28m OMNIPAQUE IOHEXOL 350 MG/ML SOLN  COMPARISON:  July 09, 2019  FINDINGS: Cardiovascular: There is a optimal opacification of the pulmonary arteries. There is no central,segmental, or subsegmental filling defects within the pulmonary arteries. A left-sided MediPort catheter is seen with the tip in the right atrium. The heart is normal in size. No pericardial effusion or thickening. No evidence right heart strain. There is normal three-vessel brachiocephalic anatomy without proximal stenosis. Scattered mild aortic atherosclerosis.  Mediastinum/Nodes: No hilar, mediastinal, or axillary adenopathy. Thyroid gland, trachea, and esophagus demonstrate no significant findings.  Lungs/Pleura: There is new/worsening multifocal patchy predominantly peripherally based ground-glass opacities seen throughout both lungs. There is also slight interval worsening in the tree-in-bud nodular hazy opacities within the left lung apex. There is unchanged spiculated masslike consolidation within the right infrahilar region, likely from post treatment changes/fibrosis. There is a trace right pleural effusion present.  Upper Abdomen: No acute abnormalities present in the visualized portions of the upper  abdomen.  Musculoskeletal: No chest wall abnormality. No acute or significant osseous findings. Again noted is diffuse skin thickening seen along the right breast. There is a surgical clips within the right breast.  Review of the MIP images confirms the above findings.  IMPRESSION: 1. No central, segmental, or subsegmental pulmonary embolism. 2. Interval worsening of the multifocal patchy ground-glass opacities throughout both lungs, consistent with multifocal pneumonia. 3. Interval slight worsening in the tree-in-bud nodular opacities within the left upper lobe, likely due to atypical infectious etiology, however cannot exclude underlying metastatic disease. 4. Post treatment radiation/fibrotic changes in the right infrahilar region. 5. Trace right pleural effusion   Electronically Signed   By: BPrudencio PairM.D.   On: 08/06/2019 00:17  Scheduled Meds: . aspirin EC  81 mg Oral Daily  . budesonide  0.5 mg Nebulization BID  . Chlorhexidine Gluconate Cloth  6 each Topical Daily  . cholecalciferol  1,000 Units Oral QHS  . diltiazem  30 mg Oral Q6H  . enoxaparin (LOVENOX) injection  40 mg Subcutaneous Q24H  . ferrous sulfate  325 mg Oral QHS  . furosemide  40 mg Intravenous BID  . ipratropium  0.5 mg Nebulization Q6H  . ipratropium-albuterol  3 mL Nebulization Once  . levalbuterol  0.63 mg Nebulization Q6H  . methylPREDNISolone (SOLU-MEDROL) injection  40 mg Intravenous Q12H  . metoprolol tartrate  25 mg Oral Once  . multivitamin with minerals  1 tablet Oral QHS  . omega-3 acid ethyl esters  1 g Oral Daily  . pantoprazole  20 mg Oral Daily  . sodium chloride flush  3 mL Intravenous Q12H  . sulfamethoxazole-trimethoprim  1 tablet Oral Once per day on Mon Wed Fri   Continuous Infusions: . sodium chloride    .  azithromycin 500 mg (08/20/19 0649)  . cefTRIAXone (ROCEPHIN)  IV Stopped (08/20/19 0645)     LOS: 2 days    Time spent: over 63 min Para Skeans, MD [facility] To contact the attending provider between 7A-7P or the covering provider during after hours 7P-7A, please log into the web site [email or web address] and access using universal [place] password for that web site. If you do not have the password, please call the hospital operator.  08/20/2019, 7:41 AM

## 2019-08-21 ENCOUNTER — Inpatient Hospital Stay: Payer: Managed Care, Other (non HMO)

## 2019-08-21 LAB — CBC WITH DIFFERENTIAL/PLATELET
Abs Immature Granulocytes: 0.2 10*3/uL — ABNORMAL HIGH (ref 0.00–0.07)
Basophils Absolute: 0 10*3/uL (ref 0.0–0.1)
Basophils Relative: 0 %
Eosinophils Absolute: 0 10*3/uL (ref 0.0–0.5)
Eosinophils Relative: 0 %
HCT: 39.8 % (ref 36.0–46.0)
Hemoglobin: 13 g/dL (ref 12.0–15.0)
Immature Granulocytes: 1 %
Lymphocytes Relative: 4 %
Lymphs Abs: 0.6 10*3/uL — ABNORMAL LOW (ref 0.7–4.0)
MCH: 29.1 pg (ref 26.0–34.0)
MCHC: 32.7 g/dL (ref 30.0–36.0)
MCV: 89 fL (ref 80.0–100.0)
Monocytes Absolute: 1.6 10*3/uL — ABNORMAL HIGH (ref 0.1–1.0)
Monocytes Relative: 9 %
Neutro Abs: 15.2 10*3/uL — ABNORMAL HIGH (ref 1.7–7.7)
Neutrophils Relative %: 86 %
Platelets: 224 10*3/uL (ref 150–400)
RBC: 4.47 MIL/uL (ref 3.87–5.11)
RDW: 15.1 % (ref 11.5–15.5)
WBC: 17.6 10*3/uL — ABNORMAL HIGH (ref 4.0–10.5)
nRBC: 0 % (ref 0.0–0.2)

## 2019-08-21 MED ORDER — POLYETHYLENE GLYCOL 3350 17 G PO PACK
17.0000 g | PACK | Freq: Every day | ORAL | Status: DC
  Administered 2019-08-22 – 2019-08-25 (×4): 17 g via ORAL
  Filled 2019-08-21 (×5): qty 1

## 2019-08-21 MED ORDER — METHYLPREDNISOLONE 4 MG PO TBPK
4.0000 mg | ORAL_TABLET | ORAL | Status: AC
  Administered 2019-08-21: 4 mg via ORAL

## 2019-08-21 MED ORDER — METHYLPREDNISOLONE 4 MG PO TBPK
4.0000 mg | ORAL_TABLET | Freq: Three times a day (TID) | ORAL | Status: DC
  Administered 2019-08-22: 4 mg via ORAL

## 2019-08-21 MED ORDER — POTASSIUM CHLORIDE CRYS ER 20 MEQ PO TBCR
20.0000 meq | EXTENDED_RELEASE_TABLET | Freq: Once | ORAL | Status: AC
  Administered 2019-08-21: 14:00:00 20 meq via ORAL
  Filled 2019-08-21: qty 1

## 2019-08-21 MED ORDER — METHYLPREDNISOLONE 4 MG PO TBPK
8.0000 mg | ORAL_TABLET | Freq: Every morning | ORAL | Status: AC
  Administered 2019-08-21: 8 mg via ORAL
  Filled 2019-08-21 (×2): qty 21

## 2019-08-21 MED ORDER — FUROSEMIDE 20 MG PO TABS
20.0000 mg | ORAL_TABLET | Freq: Two times a day (BID) | ORAL | Status: DC
  Administered 2019-08-21 – 2019-08-25 (×8): 20 mg via ORAL
  Filled 2019-08-21 (×8): qty 1

## 2019-08-21 MED ORDER — METHYLPREDNISOLONE 4 MG PO TBPK
8.0000 mg | ORAL_TABLET | Freq: Every evening | ORAL | Status: AC
  Administered 2019-08-21: 8 mg via ORAL

## 2019-08-21 MED ORDER — METHYLPREDNISOLONE 4 MG PO TBPK: 4.0000 mg | ORAL_TABLET | Freq: Four times a day (QID) | ORAL | Status: DC

## 2019-08-21 MED ORDER — METHYLPREDNISOLONE 4 MG PO TBPK: 8.0000 mg | ORAL_TABLET | Freq: Every evening | ORAL | Status: DC

## 2019-08-21 MED ORDER — POTASSIUM CHLORIDE 10 MEQ/100ML IV SOLN
10.0000 meq | INTRAVENOUS | Status: DC
  Administered 2019-08-21: 10 meq via INTRAVENOUS
  Filled 2019-08-21 (×2): qty 100

## 2019-08-21 NOTE — Progress Notes (Signed)
PROGRESS NOTE    Ashley Molina  KCM:[id number] DOB: [birth year] DOA: 08/17/2019 PCP: Patient, No Pcp Per   Brief Narrative:  Ashley Molina  is a 59 y.o. Caucasian female with a known history of breast cancer with lung metastasis status post lumpectomy, chemotherapy and radiotherapy, COVID-19 in December 2020, dyslipidemia and anemia, who presented to [facility] worsening dyspnea with associated cough with inability to expectorate as well as wheezing today.  The patient was just discharged from here on 3/10 after being treated for acute on chronic respiratory failure requiring BiPAP.  No nausea or vomiting or abdominal pain.  She denies any chest pain or palpitations.  No fever or chills.  No dysuria, oliguria or hematuria or flank pain.  Upon presentation to the emergency room, heart rate was 111 has gone up to 142 with otherwise normal vital signs.  EKG showed atrial fibrillation/flutter with a rate of 165.  Respiratory clear unless 25-28.  Labs revealed potassium of 3.6 magnesium 2.2 anion gap of 15.  BNP was 400.  High-sensitivity troponin I was 38 and later 98 and CBC showed leukocytosis of 13.9.  Influenza antigens and COVID-19 PCR came back negative.  Chest x-ray showed findings representing pulmonary edema with pneumonia not excluded.  She's currently admitted for COPD exacerbation and is being treated with lasix for pulmonary edema/heart failure as well.   Assessment & Plan:   Active Problems:   COPD exacerbation ([place])   Palliative care encounter  1.  Acute Hypoxic Respiratory Failure  COPD acute exacerbation with subsequent acute cor pulmonale with mild interstitial pulmonary edema. - CXR 3/15 with edema - Steroids, scheduled and prn nebs, pulmicort - lasix changed to po 20 mg bid. -She will be placed on scheduled and as needed duo nebs. 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  Hx of covid and radiation induced pneumonitis as well.  Multiple possible contributing factors.  Will c/s pulm to follow. -pt is on oxygen via 4 l [place] currently and her saturation are high 90"s and exam is CLTABL. -still having sob and cough.we will obtain HR chest ct >/ILD.    2. Sinus Tachycardia with PAC vs Fib/Flutter  Elevated Troponin - some question of possible afib/flutter, though suspect this is sinus tach with PAC's - will consult cardiology with elevated troponin and tachyarrythmia, suspect this is demand ischemia - recent echo with normal EF -continue diltiazem.  3.  GERD. -PPI therapy continued  4.  Hypertension. -continued on diltiazem. -BP soft due to additional iv diuretic therapy   5.  DVT prophylaxis. -Subcutaneous Lovenox  6.Hypokalemia: pt given 20 meq of kcl iv x1.    DVT prophylaxis: lovenox Code Status: full Family Communication: none at bedside Disposition Plan:  . Patient came from: home            . Anticipated d/c place: home . Barriers to d/c OR conditions which need to be met to effect a safe d/c: pending improvement in resp status . Anticipate d/c in am.  Consultants:   Oncology  Intensivist.  Cardiology Procedures:   none  Antimicrobials:  Anti-infectives (From admission, onward)   Start     Dose/Rate Route Frequency Ordered Stop   08/18/19 0900  sulfamethoxazole-trimethoprim (BACTRIM DS) 800-160 MG per tablet 1 tablet     1 tablet Oral Once per day on Mon Wed Fri 08/18/19 0622  08/18/19 0700  cefTRIAXone (ROCEPHIN) 2 g in sodium chloride 0.9 % 100 mL IVPB     2 g 200 mL/hr over 30 Minutes Intravenous Every 24 hours 08/18/19 0647     08/18/19 0700  azithromycin (ZITHROMAX) 500 mg in sodium chloride 0.9 % 250 mL IVPB     500 mg 250 mL/hr over 60 Minutes Intravenous Every 24 hours 08/18/19 0647       Subjective: SOB improved  today.  08/20/2019: Blood pressure 105/70, pulse 83, temperature 97.9 F (36.6 C), temperature source Oral, resp. rate 20, height _0  (1.6 m), weight 92.3 kg, last menstrual period 04/04/2012, SpO2 93 %.  Pt is alert awake and coughing after taking deep breaths. She has IS at bedside.  She Denies any other complaints.   08/21/19: Pt is alert and sitting on bedside recliner. She is on 4 Lnc and is comfortable with little sob / cough. Labs today show hypokalemia due to diuretic therapy, and wbc count of 12.9 down from 14/7.  Objective: Vitals:   08/21/19 0800 08/21/19 0900 08/21/19 1000 08/21/19 1100  BP:  107/60 94/79 105/70  Pulse: 87 (!) 102 94 83  Resp: (!) 23 19 (!) 21 20  Temp: 97.9 F (36.6 C)     TempSrc: Oral     SpO2: 94% 95% 94% 93%  Weight:      Height:        Intake/Output Summary (Last 24 hours) at 08/21/2019 1237 Last data filed at 08/21/2019 1159 Gross per 24 hour  Intake 700 ml  Output 1950 ml  Net -1250 ml   Filed Weights   08/19/19 0500 08/20/19 0500 08/21/19 0424  Weight: 86.3 kg 92.5 kg 92.3 kg    Examination: Blood pressure 105/70, pulse 83, temperature 97.9 F (36.6 C), temperature source Oral, resp. rate 20, height _1  (1.6 m), weight 92.3 kg, last menstrual period 04/04/2012, SpO2 93 %. General: No acute distress. Cardiovascular: Heart sounds show a tachycardic rate Lungs: mild wheezing bl on expiration , improved air entry.  Abdomen: Soft, nontender, nondistended Neurological: Alert and oriented 3. Moves all extremities 4. Cranial nerves II through XII grossly intact. Skin: Warm and dry. No rashes or lesions. Extremities: No clubbing or cyanosis.   Data Reviewed:   CBC: Recent Labs  Lab 08/15/19 0639 08/16/19 0601 08/18/19 0004 08/19/19 0444 08/20/19 0624  WBC 10.0 10.4 13.9* 14.7* 12.9*  NEUTROABS  --   --   --  13.3* 12.0*  HGB 11.7* 11.6* 13.5 12.1 12.1  HCT 38.0 36.5 44.2 39.0 36.8  MCV 92.0 91.5 94.2 91.5 89.5  PLT 201  191 181 186 812   Basic Metabolic Panel: Recent Labs  Lab 08/15/19 0639 08/16/19 0601 08/18/19 0045 08/19/19 0444 08/20/19 0624  NA 136 136 139 141 138  K 3.6 4.0 3.6 3.9 3.2*  CL 99 97* 96* 96* 93*  CO2 _2 32  GLUCOSE 129* 129* 131* 142* 140*  BUN 32* 25* 30* 32* 34*  CREATININE 0.89 0.74 0.74 0.81 0.72  CALCIUM 9.5 9.3 9.9 10.1 9.7  MG 2.1 2.2 2.2 2.4 2.3  PHOS  --  3.4  --  4.3 3.9   GFR: Estimated Creatinine Clearance: 80.8 mL/min (by C-G formula based on SCr of 0.72 mg/dL). Liver Function Tests: Recent Labs  Lab 08/18/19 0045 08/19/19 0444 08/20/19 0624  AST _3 ALT _4 ALKPHOS 105 88 80  BILITOT 0.9 0.8 1.0  PROT 6.9  6.3* 6.0*  ALBUMIN 4.0 3.4* 3.3*   No results for input(s): LIPASE, AMYLASE in the last 168 hours. No results for input(s): AMMONIA in the last 168 hours. Coagulation Profile: No results for input(s): INR, PROTIME in the last 168 hours. Cardiac Enzymes: No results for input(s): CKTOTAL, CKMB, CKMBINDEX, TROPONINI in the last 168 hours. BNP (last 3 results) No results for input(s): PROBNP in the last 8760 hours. HbA1C: No results for input(s): HGBA1C in the last 72 hours. CBG: Recent Labs  Lab 08/18/19 1039  GLUCAP 128*   Lipid Profile: No results for input(s): CHOL, HDL, LDLCALC, TRIG, CHOLHDL, LDLDIRECT in the last 72 hours. Thyroid Function Tests: No results for input(s): TSH, T4TOTAL, FREET4, T3FREE, THYROIDAB in the last 72 hours. Anemia Panel: No results for input(s): VITAMINB12, FOLATE, FERRITIN, TIBC, IRON, RETICCTPCT in the last 72 hours. Sepsis Labs: Recent Labs  Lab 08/19/19 0444  PROCALCITON <0.10    Recent Results (from the past 240 hour(s))  Blood Culture (routine x 2)     Status: None   Collection Time: 08/13/19  3:02 PM   Specimen: BLOOD  Result Value Ref Range Status   Specimen Description BLOOD BLOOD LEFT HAND  Final   Special Requests   Final    BOTTLES DRAWN AEROBIC AND ANAEROBIC Blood  Culture adequate volume   Culture   Final    NO GROWTH 5 DAYS Performed at [facility], [address], [place], [place] [address]    Report Status 08/18/2019 FINAL  Final  Blood Culture (routine x 2)     Status: None   Collection Time: 08/13/19  3:07 PM   Specimen: BLOOD  Result Value Ref Range Status   Specimen Description BLOOD LEFT ANTECUBITAL  Final   Special Requests   Final    BOTTLES DRAWN AEROBIC AND ANAEROBIC Blood Culture adequate volume   Culture   Final    NO GROWTH 5 DAYS Performed at [facility], [address], [place], [place] [address]    Report Status 08/18/2019 FINAL  Final  SARS CORONAVIRUS 2 (TAT 6-24 HRS) Nasopharyngeal Nasopharyngeal Swab     Status: None   Collection Time: 08/13/19  8:16 PM   Specimen: Nasopharyngeal Swab  Result Value Ref Range Status   SARS Coronavirus 2 NEGATIVE NEGATIVE Final    Comment: (NOTE) SARS-CoV-2 target nucleic acids are NOT DETECTED. The SARS-CoV-2 RNA is generally detectable in upper and lower respiratory specimens during the acute phase of infection. Negative results do not preclude SARS-CoV-2 infection, do not rule out co-infections with other pathogens, and should not be used as the sole basis for treatment or other patient management decisions. Negative results must be combined with clinical observations, patient history, and epidemiological information. The expected result is Negative. Fact Sheet for Patients: SugarRoll.be Fact Sheet for Healthcare Providers: https://www.woods-mathews.com/ This test is not yet approved or cleared by the [place] FDA and  has been authorized for detection and/or diagnosis of SARS-CoV-2 by FDA under an Emergency Use Authorization (EUA). This EUA will remain  in effect (meaning this test can be used) for the duration of the COVID-19 declaration under Section 56 4(b)(1) of the Act, 21 U.S.C. section 360bbb-3(b)(1),  unless the authorization is terminated or revoked sooner. Performed at [facility], [place] [address], [place], [place] [address]   MRSA PCR Screening     Status: None   Collection Time: 08/13/19 11:28 PM   Specimen: Nasopharyngeal  Result Value Ref Range Status  MRSA by PCR NEGATIVE NEGATIVE Final    Comment:        The GeneXpert MRSA Assay (FDA approved for NASAL specimens only), is one component of a comprehensive MRSA colonization surveillance program. It is not intended to diagnose MRSA infection nor to guide or monitor treatment for MRSA infections. Performed at [facility], [address] [place], [place] [address]   Respiratory Panel by RT PCR (Flu A&B, Covid) - Nasopharyngeal Swab     Status: None   Collection Time: 08/18/19  2:23 AM   Specimen: Nasopharyngeal Swab  Result Value Ref Range Status   SARS Coronavirus 2 by RT PCR NEGATIVE NEGATIVE Final    Comment: (NOTE) SARS-CoV-2 target nucleic acids are NOT DETECTED. The SARS-CoV-2 RNA is generally detectable in upper respiratoy specimens during the acute phase of infection. The lowest concentration of SARS-CoV-2 viral copies this assay can detect is 131 copies/mL. A negative result does not preclude SARS-Cov-2 infection and should not be used as the sole basis for treatment or other patient management decisions. A negative result may occur with  improper specimen collection/handling, submission of specimen other than nasopharyngeal swab, presence of viral mutation(s) within the areas targeted by this assay, and inadequate number of viral copies (<131 copies/mL). A negative result must be combined with clinical observations, patient history, and epidemiological information. The expected result is Negative. Fact Sheet for Patients:  PinkCheek.be Fact Sheet for Healthcare Providers:  GravelBags.it This test is not yet ap proved or cleared by  the [place] FDA and  has been authorized for detection and/or diagnosis of SARS-CoV-2 by FDA under an Emergency Use Authorization (EUA). This EUA will remain  in effect (meaning this test can be used) for the duration of the COVID-19 declaration under Section 564(b)(1) of the Act, 21 U.S.C. section 360bbb-3(b)(1), unless the authorization is terminated or revoked sooner.    Influenza A by PCR NEGATIVE NEGATIVE Final   Influenza B by PCR NEGATIVE NEGATIVE Final    Comment: (NOTE) The Xpert Xpress SARS-CoV-2/FLU/RSV assay is intended as an aid in  the diagnosis of influenza from Nasopharyngeal swab specimens and  should not be used as a sole basis for treatment. Nasal washings and  aspirates are unacceptable for Xpert Xpress SARS-CoV-2/FLU/RSV  testing. Fact Sheet for Patients: PinkCheek.be Fact Sheet for Healthcare Providers: GravelBags.it This test is not yet approved or cleared by the [place] FDA and  has been authorized for detection and/or diagnosis of SARS-CoV-2 by  FDA under an Emergency Use Authorization (EUA). This EUA will remain  in effect (meaning this test can be used) for the duration of the  Covid-19 declaration under Section 564(b)(1) of the Act, 21  U.S.C. section 360bbb-3(b)(1), unless the authorization is  terminated or revoked. Performed at [facility], [address] [place], [place] [address]   CULTURE, BLOOD (ROUTINE X 2) w Reflex to ID Panel     Status: None (Preliminary result)   Collection Time: 08/18/19  8:19 AM   Specimen: BLOOD  Result Value Ref Range Status   Specimen Description BLOOD LEFT HAND   Final   Special Requests   Final    BOTTLES DRAWN AEROBIC AND ANAEROBIC Blood Culture adequate volume   Culture   Final    NO GROWTH 3 DAYS Performed at [facility], [address] [place], [place] [address]    Report Status PENDING  Incomplete  CULTURE, BLOOD  (ROUTINE X 2) w Reflex to ID Panel  Status: None (Preliminary result)   Collection Time: 08/18/19  8:24 AM   Specimen: BLOOD  Result Value Ref Range Status   Specimen Description BLOOD LEFT WRIST  Final   Special Requests   Final    BOTTLES DRAWN AEROBIC AND ANAEROBIC Blood Culture adequate volume   Culture   Final    NO GROWTH 3 DAYS Performed at [facility], [address] [place], [place] [address]    Report Status PENDING  Incomplete     Radiology Studies: CTA chest: CLINICAL DATA:  Hemoptysis, history of breast cancer and COVID  EXAM: CT ANGIOGRAPHY CHEST WITH CONTRAST  TECHNIQUE: Multidetector CT imaging of the chest was performed using the standard protocol during bolus administration of intravenous contrast. Multiplanar CT image reconstructions and MIPs were obtained to evaluate the vascular anatomy.  CONTRAST:  37m OMNIPAQUE IOHEXOL 350 MG/ML SOLN  COMPARISON:  July 09, 2019  FINDINGS: Cardiovascular: There is a optimal opacification of the pulmonary arteries. There is no central,segmental, or subsegmental filling defects within the pulmonary arteries. A left-sided MediPort catheter is seen with the tip in the right atrium. The heart is normal in size. No pericardial effusion or thickening. No evidence right heart strain. There is normal three-vessel brachiocephalic anatomy without proximal stenosis. Scattered mild aortic atherosclerosis.  Mediastinum/Nodes: No hilar, mediastinal, or axillary adenopathy. Thyroid gland, trachea, and esophagus demonstrate no significant findings.  Lungs/Pleura: There is new/worsening multifocal patchy predominantly peripherally based ground-glass opacities seen throughout both lungs. There is also slight interval worsening in the tree-in-bud nodular hazy opacities within the left lung apex. There is unchanged spiculated masslike consolidation within the right infrahilar region, likely from post  treatment changes/fibrosis. There is a trace right pleural effusion present.  Upper Abdomen: No acute abnormalities present in the visualized portions of the upper abdomen.  Musculoskeletal: No chest wall abnormality. No acute or significant osseous findings. Again noted is diffuse skin thickening seen along the right breast. There is a surgical clips within the right breast.  Review of the MIP images confirms the above findings.  IMPRESSION: 1. No central, segmental, or subsegmental pulmonary embolism. 2. Interval worsening of the multifocal patchy ground-glass opacities throughout both lungs, consistent with multifocal pneumonia. 3. Interval slight worsening in the tree-in-bud nodular opacities within the left upper lobe, likely due to atypical infectious etiology, however cannot exclude underlying metastatic disease. 4. Post treatment radiation/fibrotic changes in the right infrahilar region. 5. Trace right pleural effusion   Electronically Signed   By: BPrudencio PairM.D.   On: 08/06/2019 00:17  Scheduled Meds: . aspirin EC  81 mg Oral Daily  . budesonide  0.5 mg Nebulization BID  . Chlorhexidine Gluconate Cloth  6 each Topical Daily  . cholecalciferol  1,000 Units Oral QHS  . diltiazem  60 mg Oral Q6H  . enoxaparin (LOVENOX) injection  40 mg Subcutaneous Q24H  . ferrous sulfate  325 mg Oral QHS  . furosemide  20 mg Oral BID  . ipratropium  0.5 mg Nebulization Q6H  . ipratropium-albuterol  3 mL Nebulization Once  . levalbuterol  0.63 mg Nebulization Q6H  . methylPREDNISolone  4 mg Oral PC lunch  . methylPREDNISolone  4 mg Oral PC supper  . [START ON 08/22/2019] methylPREDNISolone  4 mg Oral 3 x daily with food  . [START ON 08/23/2019] methylPREDNISolone  4 mg Oral 4X daily taper  . methylPREDNISolone  8 mg Oral Nightly  . [START ON 08/22/2019] methylPREDNISolone  8 mg Oral Nightly  .  metoprolol tartrate  25 mg Oral Once  . multivitamin with minerals  1 tablet Oral  QHS  . omega-3 acid ethyl esters  1 g Oral Daily  . pantoprazole  20 mg Oral Daily  . sodium chloride flush  3 mL Intravenous Q12H  . sulfamethoxazole-trimethoprim  1 tablet Oral Once per day on Mon Wed Fri   Continuous Infusions: . sodium chloride    . azithromycin Stopped (08/21/19 0751)  . cefTRIAXone (ROCEPHIN)  IV 2 g (08/21/19 5038)  . potassium chloride 10 mEq (08/21/19 1136)     LOS: 3 days    Time spent: over 30 min Para Skeans, MD [facility] To contact the attending provider between 7A-7P or the covering provider during after hours 7P-7A, please log into the web site [email or web address] and access using universal [place] password for that web site. If you do not have the password, please call the hospital operator.  08/21/2019, 12:37 PM

## 2019-08-21 NOTE — Progress Notes (Signed)
Hematology/Oncology Consult note [facility]  Telephone:([phone] [phone]  Patient Care Team: Patient, No Pcp Per as PCP - General (General Practice) Byrnett, Forest Gleason, MD (General Surgery) Gae Dry, MD as Referring Physician (Obstetrics and Gynecology)   Name of the patient: Ashley Molina  [id number]  [birth year]   Date of visit: 08/21/2019  Interval history- she is sitting up in a chair. Saturating in 90's on 4L. She walked a little with PT but felt unsteady  ECOG PS- 3 Pain scale- 0   Review of systems- Review of Systems  Constitutional: Positive for malaise/fatigue. Negative for chills, fever and weight loss.  HENT: Negative for congestion, ear discharge and nosebleeds.   Eyes: Negative for blurred vision.  Respiratory: Positive for shortness of breath. Negative for cough, hemoptysis, sputum production and wheezing.   Cardiovascular: Negative for chest pain, palpitations, orthopnea and claudication.  Gastrointestinal: Negative for abdominal pain, blood in stool, constipation, diarrhea, heartburn, melena, nausea and vomiting.  Genitourinary: Negative for dysuria, flank pain, frequency, hematuria and urgency.  Musculoskeletal: Negative for back pain, joint pain and myalgias.  Skin: Negative for rash.  Neurological: Negative for dizziness, tingling, focal weakness, seizures, weakness and headaches.  Endo/Heme/Allergies: Does not bruise/bleed easily.  Psychiatric/Behavioral: Negative for depression and suicidal ideas. The patient does not have insomnia.       Allergies  Allergen Reactions  . Penicillins Rash    Did it involve swelling of the face/tongue/throat, SOB, or low BP? No Did it involve sudden or severe rash/hives, skin peeling, or any reaction on the inside of your mouth or nose? Yes Did you need to seek medical attention at a hospital or doctor's office? No When did it last happen? If all above answers  are "NO", may proceed with cephalosporin use.     Past Medical History:  Diagnosis Date  . Anemia   . Breast cancer ([place])   . Breast cancer, right ([place]) 04/2017   Hx Lumpectomy, Chemo + Rad tx's.  . Breast cyst, right    aspirated by Dr. Bary Castilla  . COVID-19   . Hyperlipidemia   . Personal history of chemotherapy   . Pre-diabetes      Past Surgical History:  Procedure Laterality Date  . AXILLARY LYMPH NODE BIOPSY Right 04/09/2017   Procedure: AXILLARY LYMPH NODE BIOPSY;  Surgeon: Robert Bellow, MD;  Location: [facility] ORS;  Service: General;  Laterality: Right;  . BREAST BIOPSY Right 03/27/2017   US guided breast mass - invasive mammary carcinoma  . BREAST BIOPSY Right 03/27/2017   Lymph node - metastatic carcinoma  . BREAST BIOPSY Right 04/09/2017   Lymph node   . BREAST CYST ASPIRATION Right 11/2009   Dr. Bary Castilla did FNA  . BREAST EXCISIONAL BIOPSY Right 09/28/2017   lumpectomy and 12 lympnode rad neo adj chemo  . COLONOSCOPY  2011  . DILATION AND CURETTAGE OF UTERUS     X3  . ENDOBRONCHIAL ULTRASOUND N/A 04/09/2017   Procedure: ENDOBRONCHIAL ULTRASOUND;  Surgeon: Laverle Hobby, MD;  Location: [facility] ORS;  Service: Pulmonary;  Laterality: N/A;  . ENDOMETRIAL ABLATION    . ENDOMETRIAL BIOPSY  09/2009  . PORTACATH PLACEMENT Left 04/09/2017   Procedure: INSERTION PORT-A-CATH;  Surgeon: Robert Bellow, MD;  Location: [facility] ORS;  Service: General;  Laterality: Left;    Social History   Socioeconomic History  . Marital status: Married    Spouse name: Not on file  . Number of children: Not on  file  . Years of education: Not on file  . Highest education level: Not on file  Occupational History  . Not on file  Tobacco Use  . Smoking status: Never Smoker  . Smokeless tobacco: Never Used  Substance and Sexual Activity  . Alcohol use: Not Currently  . Drug use: No  . Sexual activity: Not Currently  Other Topics Concern  . Not on file  Social History  Narrative  . Not on file   Social Determinants of Health   Financial Resource Strain:   . Difficulty of Paying Living Expenses:   Food Insecurity:   . Worried About Charity fundraiser in the Last Year:   . Arboriculturist in the Last Year:   Transportation Needs:   . Film/video editor (Medical):   Marland Kitchen Lack of Transportation (Non-Medical):   Physical Activity:   . Days of Exercise per Week:   . Minutes of Exercise per Session:   Stress:   . Feeling of Stress :   Social Connections:   . Frequency of Communication with Friends and Family:   . Frequency of Social Gatherings with Friends and Family:   . Attends Religious Services:   . Active Member of Clubs or Organizations:   . Attends Archivist Meetings:   Marland Kitchen Marital Status:   Intimate Partner Violence:   . Fear of Current or Ex-Partner:   . Emotionally Abused:   Marland Kitchen Physically Abused:   . Sexually Abused:     Family History  Problem Relation Age of Onset  . Melanoma Maternal Grandmother 51       currently 9  . Brain cancer Maternal Grandfather 81       unk. type; deceased in 16s  . Melanoma Other 66       mat grandmother's father  . Melanoma Father 72       on head; currently 89  . Prostate cancer Maternal Uncle        3 maternal uncles; dx in 19s  . Breast cancer Other        mat grandfather's sister; dx 63s     Current Facility-Administered Medications:  .  0.9 %  sodium chloride infusion, 250 mL, Intravenous, PRN, Mansy, Jan A, MD .  acetaminophen (TYLENOL) tablet 650 mg, 650 mg, Oral, Q4H PRN, Mansy, Jan A, MD .  aspirin EC tablet 81 mg, 81 mg, Oral, Daily, Mansy, Jan A, MD, 81 mg at 08/21/19 [id number] .  azithromycin (ZITHROMAX) 500 mg in sodium chloride 0.9 % 250 mL IVPB, 500 mg, Intravenous, Q24H, Mansy, Arvella Merles, MD, Stopped at 08/21/19 0751 .  budesonide (PULMICORT) nebulizer solution 0.5 mg, 0.5 mg, Nebulization, BID, Mansy, Jan A, MD, 0.5 mg at 08/21/19 1959 .  cefTRIAXone (ROCEPHIN) 2 g in sodium  chloride 0.9 % 100 mL IVPB, 2 g, Intravenous, Q24H, Mansy, Jan A, MD, Last Rate: 200 mL/hr at 08/21/19 0613, 2 g at 08/21/19 [id number] .  Chlorhexidine Gluconate Cloth 2 % PADS 6 each, 6 each, Topical, Daily, Elodia Florence., MD, 6 each at 08/18/19 1401 .  cholecalciferol (VITAMIN D3) tablet 1,000 Units, 1,000 Units, Oral, QHS, Mansy, Arvella Merles, MD, 1,000 Units at 08/20/19 2207 .  diltiazem (CARDIZEM) tablet 60 mg, 60 mg, Oral, Q6H, Teodoro Spray, MD, 60 mg at 08/21/19 1746 .  enoxaparin (LOVENOX) injection 40 mg, 40 mg, Subcutaneous, Q24H, Mansy, Jan A, MD, 40 mg at 08/21/19 0609 .  ferrous sulfate tablet 325 mg,  325 mg, Oral, QHS, Mansy, Jan A, MD, 325 mg at 08/20/19 2208 .  furosemide (LASIX) tablet 20 mg, 20 mg, Oral, BID, Para Skeans, MD, 20 mg at 08/21/19 1746 .  guaiFENesin-dextromethorphan (ROBITUSSIN DM) 100-10 MG/5ML syrup 15 mL, 15 mL, Oral, Q4H PRN, Mansy, Jan A, MD .  ipratropium (ATROVENT) nebulizer solution 0.5 mg, 0.5 mg, Nebulization, Q6H, Elodia Florence., MD, 0.5 mg at 08/21/19 1956 .  ipratropium-albuterol (DUONEB) 0.5-2.5 (3) MG/3ML nebulizer solution 3 mL, 3 mL, Nebulization, Once, Blake Divine, MD .  levalbuterol [facility]) nebulizer solution 0.63 mg, 0.63 mg, Nebulization, Q6H, Elodia Florence., MD, 0.63 mg at 08/21/19 1957 .  LORazepam (ATIVAN) tablet 0.5 mg, 0.5 mg, Oral, Q6H PRN, Borders, Vonna Kotyk R, NP, 0.5 mg at 08/19/19 1132 .  [START ON 08/22/2019] methylPREDNISolone (MEDROL DOSEPAK) tablet 4 mg, 4 mg, Oral, 3 x daily with food, Para Skeans, MD .  Derrill Memo ON 08/23/2019] methylPREDNISolone (MEDROL DOSEPAK) tablet 4 mg, 4 mg, Oral, 4X daily taper, Posey Pronto, Ekta V, MD .  methylPREDNISolone (MEDROL DOSEPAK) tablet 8 mg, 8 mg, Oral, Nightly, Para Skeans, MD .  Derrill Memo ON 08/22/2019] methylPREDNISolone (MEDROL DOSEPAK) tablet 8 mg, 8 mg, Oral, Nightly, Patel, Ekta V, MD .  metoprolol tartrate (LOPRESSOR) tablet 25 mg, 25 mg, Oral, Once, Blake Divine, MD, Stopped  at 08/18/19 0424 .  morphine 2 MG/ML injection 1-2 mg, 1-2 mg, Intravenous, Q2H PRN, Borders, Kirt Boys, NP .  multivitamin with minerals tablet 1 tablet, 1 tablet, Oral, QHS, Mansy, Jan A, MD, 1 tablet at 08/20/19 2207 .  omega-3 acid ethyl esters (LOVAZA) capsule 1 g, 1 g, Oral, Daily, Mansy, Jan A, MD, 1 g at 08/21/19 0834 .  ondansetron (ZOFRAN) injection 4 mg, 4 mg, Intravenous, Q6H PRN, Mansy, Jan A, MD .  pantoprazole (PROTONIX) EC tablet 20 mg, 20 mg, Oral, Daily, Mansy, Jan A, MD, 20 mg at 08/21/19 0834 .  sodium chloride flush (NS) 0.9 % injection 3 mL, 3 mL, Intravenous, Q12H, Mansy, Jan A, MD, 3 mL at 08/21/19 0842 .  sodium chloride flush (NS) 0.9 % injection 3 mL, 3 mL, Intravenous, PRN, Mansy, Jan A, MD .  sulfamethoxazole-trimethoprim (BACTRIM DS) 800-160 MG per tablet 1 tablet, 1 tablet, Oral, Once per day on Mon Wed Fri, Mansy, Jan A, MD, 1 tablet at 08/20/19 [id number]  Physical exam:  Vitals:   08/21/19 1400 08/21/19 1500 08/21/19 1600 08/21/19 1716  BP: 110/74 102/62 112/69 124/77  Pulse: 99 91 95 96  Resp: 19 (!) 28 (!) 29 20  Temp:    98.3 F (36.8 C)  TempSrc:    Oral  SpO2: 93% 95% 96% 94%  Weight:      Height:       Physical Exam HENT:     Head: Normocephalic and atraumatic.  Eyes:     Pupils: Pupils are equal, round, and reactive to light.  Cardiovascular:     Rate and Rhythm: Normal rate and regular rhythm.     Heart sounds: Normal heart sounds.  Pulmonary:     Comments: Effort increased. B/l rhonchi but air entry appears improved today Abdominal:     General: Bowel sounds are normal.     Palpations: Abdomen is soft.  Musculoskeletal:     Cervical back: Normal range of motion.     Comments: B/l +1 edema  Skin:    General: Skin is warm and dry.  Neurological:     Mental Status: She is  alert and oriented to person, place, and time.      CMP Latest Ref Rng & Units 08/20/2019  Glucose 70 - 99 mg/dL 140(H)  BUN 6 - 20 mg/dL 34(H)  Creatinine 0.44 - 1.00  mg/dL 0.72  Sodium 135 - 145 mmol/L 138  Potassium 3.5 - 5.1 mmol/L 3.2(L)  Chloride 98 - 111 mmol/L 93(L)  CO2 22 - 32 mmol/L 32  Calcium 8.9 - 10.3 mg/dL 9.7  Total Protein 6.5 - 8.1 g/dL 6.0(L)  Total Bilirubin 0.3 - 1.2 mg/dL 1.0  Alkaline Phos 38 - 126 U/L 80  AST 15 - 41 U/L 21  ALT 0 - 44 U/L 25   CBC Latest Ref Rng & Units 08/21/2019  WBC 4.0 - 10.5 K/uL 17.6(H)  Hemoglobin 12.0 - 15.0 g/dL 13.0  Hematocrit 36.0 - 46.0 % 39.8  Platelets 150 - 400 K/uL 224    @IMAGES @  DG Chest 1 View  Result Date: 08/18/2019 CLINICAL DATA:  59 year old female with shortness of breath. EXAM: CHEST  1 VIEW COMPARISON:  Chest radiograph dated 08/14/2019 and CT dated 08/06/2019 FINDINGS: Right hilar post treatment changes. There is diffuse interstitial prominence and lower lung field hazy densities likely representing edema. Pneumonia is not excluded. Clinical correlation is recommended. There is no pneumothorax. There is a small right pleural effusion. Stable cardiac silhouette. Left pectoral Port-A-Cath with tip close to the cavoatrial junction. Atherosclerotic calcification of the aorta. No acute osseous pathology. IMPRESSION: Findings likely representing edema. Pneumonia is not excluded. Electronically Signed   By: Anner Crete M.D.   On: 08/18/2019 00:50   CT Angio Chest PE W and/or Wo Contrast  Result Date: 08/06/2019 CLINICAL DATA:  Hemoptysis, history of breast cancer and COVID EXAM: CT ANGIOGRAPHY CHEST WITH CONTRAST TECHNIQUE: Multidetector CT imaging of the chest was performed using the standard protocol during bolus administration of intravenous contrast. Multiplanar CT image reconstructions and MIPs were obtained to evaluate the vascular anatomy. CONTRAST:  67mL OMNIPAQUE IOHEXOL 350 MG/ML SOLN COMPARISON:  July 09, 2019 FINDINGS: Cardiovascular: There is a optimal opacification of the pulmonary arteries. There is no central,segmental, or subsegmental filling defects within the  pulmonary arteries. A left-sided MediPort catheter is seen with the tip in the right atrium. The heart is normal in size. No pericardial effusion or thickening. No evidence right heart strain. There is normal three-vessel brachiocephalic anatomy without proximal stenosis. Scattered mild aortic atherosclerosis. Mediastinum/Nodes: No hilar, mediastinal, or axillary adenopathy. Thyroid gland, trachea, and esophagus demonstrate no significant findings. Lungs/Pleura: There is new/worsening multifocal patchy predominantly peripherally based ground-glass opacities seen throughout both lungs. There is also slight interval worsening in the tree-in-bud nodular hazy opacities within the left lung apex. There is unchanged spiculated masslike consolidation within the right infrahilar region, likely from post treatment changes/fibrosis. There is a trace right pleural effusion present. Upper Abdomen: No acute abnormalities present in the visualized portions of the upper abdomen. Musculoskeletal: No chest wall abnormality. No acute or significant osseous findings. Again noted is diffuse skin thickening seen along the right breast. There is a surgical clips within the right breast. Review of the MIP images confirms the above findings. IMPRESSION: 1. No central, segmental, or subsegmental pulmonary embolism. 2. Interval worsening of the multifocal patchy ground-glass opacities throughout both lungs, consistent with multifocal pneumonia. 3. Interval slight worsening in the tree-in-bud nodular opacities within the left upper lobe, likely due to atypical infectious etiology, however cannot exclude underlying metastatic disease. 4. Post treatment radiation/fibrotic changes in the right infrahilar  region. 5. Trace right pleural effusion Electronically Signed   By: Prudencio Pair M.D.   On: 08/06/2019 00:17   CT Chest High Resolution  Result Date: 08/21/2019 CLINICAL DATA:  Hypoxemia. COVID-19 in December. Breast cancer with lung  metastasis status post chemotherapy and radiotherapy. EXAM: CT CHEST WITHOUT CONTRAST TECHNIQUE: Multidetector CT imaging of the chest was performed following the standard protocol without intravenous contrast. High resolution imaging of the lungs, as well as inspiratory and expiratory imaging, was performed. COMPARISON:  08/18/2019 chest radiograph. 08/06/2019 chest CT angiogram. FINDINGS: Cardiovascular: Normal heart size. Trace pericardial effusion/thickening is stable. Left subclavian Port-A-Cath terminates at the cavoatrial junction. Mildly atherosclerotic nonaneurysmal thoracic aorta. Dilated main pulmonary artery (3.5 cm diameter). Mediastinum/Nodes: No discrete thyroid nodules. Unremarkable esophagus. Surgical clips again noted in the right axilla. No pathologically enlarged axillary lymph nodes. No discrete mediastinal adenopathy. Lungs/Pleura: No pneumothorax. Small dependent right and trace dependent left pleural effusions are unchanged. There is extensive patchy irregular thickening of the peribronchovascular interstitium and interlobular septa throughout both lungs, most prominent in the left lower lobe, which appears to be progressively worsening on multiple chest CT studies back to 04/04/2019. Central perihilar prominent interstitial thickening also appears progressively increased back to 04/04/2019 CT. Patchy ground-glass opacities throughout both lungs have worsened. Similarly, there is extensive patchy sub solid pulmonary nodularity throughout both lungs with progressive enlargement back to 07/09/2019 chest CT. Representative basilar right lower lobe 1.8 cm nodule (series 3/image 127), previously 0.6 cm on 07/09/2019 and 1.4 cm on 08/06/2019 CT. Representative right upper lobe 1.1 cm nodule (series 3/image 41), new. Medial left lower lobe 1.7 cm nodule (series 3/image 113), previously 0.9 cm on 07/09/2019 and 1.1 cm on 08/06/2019. No significant regions of bronchiectasis. No honeycombing. Moderate  patchy air trapping in both lungs on the expiration sequence. Upper abdomen: No acute abnormality. Musculoskeletal: No aggressive appearing focal osseous lesions. Mild thoracic spondylosis. IMPRESSION: 1. Extensive patchy irregular peribronchovascular and interlobular septal thickening, patchy ground-glass opacities, subsolid nodularity and irregular perihilar consolidation throughout both lungs, all progressively worsening on multiple chest CT studies back to 04/04/2019. 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 2. Stable small dependent right and trace dependent left pleural effusions. 3. Moderate patchy air trapping in both lungs, indicative of small airways disease. 4. Dilated main pulmonary artery, suggesting pulmonary arterial hypertension. 5. Aortic Atherosclerosis (ICD10-I70.0). Electronically Signed   By: Ilona Sorrel M.D.   On: 08/21/2019 12:47   DG Chest Port 1 View  Result Date: 08/14/2019 CLINICAL DATA:  Acute respiratory failure. EXAM: PORTABLE CHEST 1 VIEW COMPARISON:  Chest x-ray 08/13/2019 FINDINGS: The left subclavian power port is stable. Stable radiation changes involving the right hilum and right paramediastinal lung. Persistent patchy nodular infiltrates and small right effusion. No pneumothorax. IMPRESSION: Stable chest x-ray. Persistent patchy nodular infiltrates and small right effusion. Electronically Signed   By: Marijo Sanes M.D.   On: 08/14/2019 06:19   DG Chest Port 1 View  Result Date: 08/13/2019 CLINICAL DATA:  Shortness of breath, tachypnea with decreased oxygen saturations., history of breast cancer. EXAM: PORTABLE CHEST 1 VIEW COMPARISON:  08/06/2019, CT of the chest. FINDINGS: Masslike appearance of right hilum similar to previous exam. Cardiomediastinal contours are stable. Left Port-A-Cath terminates at the caval to atrial junction. Nodular opacities are again  suggested and there is increase in interstitial prominence when compared to the chest x-ray from 08/05/2019. Blunting of right costophrenic angles suggest  scarring or small effusion. No signs of dense consolidation aside from perihilar changes discussed above. Visualized skeletal structures without acute bone finding. IMPRESSION: Masslike appearance of right hilum similar to previous exam. Nodular opacities are again suggested and there is increase in interstitial prominence when compared to the chest x-ray from 08/05/2019. Findings may represent worsening of pulmonary parenchymal disease, associated with previously reported COVID-19 infection, particularly in the left lung base, potentially with a background of pulmonary edema. Electronically Signed   By: Zetta Bills M.D.   On: 08/13/2019 15:26   DG Chest Portable 1 View  Result Date: 08/05/2019 CLINICAL DATA:  59 year old female with hemoptysis. History of breast cancer. EXAM: PORTABLE CHEST 1 VIEW COMPARISON:  Chest radiograph dated 04/09/2017. CT dated 07/09/2019. FINDINGS: Port-A-Cath with tip in the region of the cavoatrial junction. Right hilar density corresponding to the post treatment/post radiation fibrosis seen on the prior CT. Diffuse interstitial and vascular prominence with scattered faint reticulonodular densities throughout the lungs. Probable small bilateral pleural effusions. No pneumothorax. The cardiac silhouette is within normal limits. Atherosclerotic calcification of the aorta. No acute osseous pathology. IMPRESSION: Right hilar post radiation fibrosis and scattered bilateral reticulonodular densities and small bilateral pleural effusions. Overall findings are similar to the CT of 07/09/2019. Electronically Signed   By: Anner Crete M.D.   On: 08/05/2019 22:59   ECHOCARDIOGRAM COMPLETE  Result Date: 08/14/2019    ECHOCARDIOGRAM REPORT   Patient Name:   Ashley Molina Date of Exam: 08/14/2019 Medical Rec #:  [id number]           Height:       63.0 in Accession #:    [id number]         Weight:       198.0 lb Date of Birth:  1961-02-07         BSA:          1.925 m Patient Age:    36 years           BP:           105/71 mmHg Patient Gender: F                  HR:           95 bpm. Exam Location:  [facility] Procedure: 2D Echo, Cardiac Doppler and Color Doppler Indications:     Dyspnea 786.09  History:         Patient has prior history of Echocardiogram examinations, most                  recent 04/22/2019. Breast cancer, covid-19, history of                  chemotherapy.  Sonographer:     [name] RDCS (AE) Referring Phys:  [id number] [facility] AMIN Diagnosing Phys: Serafina Royals MD  Sonographer Comments: Suboptimal apical window. IMPRESSIONS  1. Left ventricular ejection fraction, by estimation, is 55 to 60%. The left ventricle has normal function. The left ventricle has no regional wall motion abnormalities. Left ventricular diastolic parameters were normal.  2. Right ventricular systolic function is normal. The right ventricular size is normal. There is normal pulmonary artery systolic pressure.  3. The mitral valve is normal in structure. Trivial mitral valve regurgitation.  4. The aortic valve is normal in structure. Aortic valve regurgitation is not visualized. FINDINGS  Left Ventricle: Left ventricular ejection fraction, by estimation, is 55 to 60%. The left ventricle has normal function.  The left ventricle has no regional wall motion abnormalities. The left ventricular internal cavity size was normal in size. There is  no left ventricular hypertrophy. Left ventricular diastolic parameters were normal. Right Ventricle: The right ventricular size is normal. No increase in right ventricular wall thickness. Right ventricular systolic function is normal. There is normal pulmonary artery systolic pressure. The tricuspid regurgitant velocity is 1.87 m/s, and  with an assumed right atrial pressure of 10 mmHg, the estimated right ventricular  systolic pressure is 0000000 mmHg. Left Atrium: Left atrial size was normal in size. Right Atrium: Right atrial size was normal in size. Pericardium: There is no evidence of pericardial effusion. Mitral Valve: The mitral valve is normal in structure. Trivial mitral valve regurgitation. Tricuspid Valve: The tricuspid valve is normal in structure. Tricuspid valve regurgitation is trivial. Aortic Valve: The aortic valve is normal in structure. Aortic valve regurgitation is not visualized. Aortic valve mean gradient measures 2.0 mmHg. Aortic valve peak gradient measures 3.3 mmHg. Aortic valve area, by VTI measures 3.78 cm. Pulmonic Valve: The pulmonic valve was normal in structure. Pulmonic valve regurgitation is not visualized. Aorta: The aortic root and ascending aorta are structurally normal, with no evidence of dilitation. IAS/Shunts: No atrial level shunt detected by color flow Doppler.  LEFT VENTRICLE PLAX 2D LVIDd:         3.86 cm  Diastology LVIDs:         2.43 cm  LV e' lateral:   9.79 cm/s LV PW:         0.82 cm  LV E/e' lateral: 6.0 LV IVS:        0.82 cm  LV e' medial:    6.96 cm/s LVOT diam:     2.00 cm  LV E/e' medial:  8.4 LV SV:         44 LV SV Index:   23 LVOT Area:     3.14 cm  RIGHT VENTRICLE RV Basal diam:  3.94 cm TAPSE (M-mode): 3.5 cm LEFT ATRIUM           Index       RIGHT ATRIUM           Index LA diam:      2.00 cm 1.04 cm/m  RA Area:     19.40 cm LA Vol (A2C): 24.8 ml 12.88 ml/m RA Volume:   58.80 ml  30.54 ml/m LA Vol (A4C): 24.8 ml 12.88 ml/m  AORTIC VALVE                   PULMONIC VALVE AV Area (Vmax):    3.11 cm    PV Vmax:        0.48 m/s AV Area (Vmean):   3.48 cm    PV Peak grad:   0.9 mmHg AV Area (VTI):     3.78 cm    RVOT Peak grad: 1 mmHg AV Vmax:           90.35 cm/s AV Vmean:          62.550 cm/s AV VTI:            0.115 m AV Peak Grad:      3.3 mmHg AV Mean Grad:      2.0 mmHg LVOT Vmax:         89.40 cm/s LVOT Vmean:        69.200 cm/s LVOT VTI:          0.139 m LVOT/AV  VTI ratio:  1.20  AORTA Ao Root diam: 2.50 cm MITRAL VALVE               TRICUSPID VALVE MV Area (PHT): 8.62 cm    TR Peak grad:   14.0 mmHg MV Decel Time: 88 msec     TR Vmax:        187.00 cm/s MV E velocity: 58.70 cm/s MV A velocity: 86.80 cm/s  SHUNTS MV E/A ratio:  0.68        Systemic VTI:  0.14 m                            Systemic Diam: 2.00 cm Serafina Royals MD Electronically signed by Serafina Royals MD Signature Date/Time: 08/14/2019/1:54:24 PM    Final      Assessment and plan- Patient is a 59 y.o. female with metastatic triple negative breast cancer admitted for acute on chronic hypoxic respiratory failure  Acute on chronic respiratory failure: repeat chest shows worsening ground glass opacities including interlobular thickening and sub solid nodularity. No hilar or mediastinal adenopathy. 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 This is patients 3rd admission in the last month and she appears to improve clinically to some extent with lasix and steroids. If it is deemed by pulmonary that bronchoscopy cannot be done safely and she is clinically stable for discharge, I will obtain second opinion from [facility] oncology where she has been seen in the past. I have discussed this with DR. Aleskarov and will again d/w with Dr. Patsey Berthold as well tomorrow.   Continue current scope of care.    Visit Diagnosis 1. Acute respiratory failure with hypoxia (HCC)   2. 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 COPD exacerbation ([place])   4. Acute pulmonary edema (HCC)      Dr. Randa Evens, MD, MPH [facility] at [facility] [id number] 08/21/2019 8:36 PM

## 2019-08-21 NOTE — Evaluation (Signed)
Physical Therapy Evaluation Patient Details Name: Ashley Molina MRN: [id number] DOB: [birth year] Today's Date: 08/21/2019   History of Present Illness  Pt is a 59 y.o.  female with a known history of breast cancer with lung metastasis status post lumpectomy, chemotherapy and radiotherapy, COVID-19 in December 2020, dyslipidemia and anemia, who presented to [facility] worsening dyspnea with associated cough with inability to expectorate as well as wheezing today.  The patient was just discharged from here on 3/10 after being treated for acute on chronic respiratory failure requiring BiPAP. elevated troponin per cardiology due to demand ischemia.    Clinical Impression  Patient alert, oriented, family at bedside, pt in recliner. Denied pain except for L hip pain with MMT testing. Per chart and pt she works full time, previously independent, but has a RW and SPC that she could utilize.   The patient performed sit <> stand with no AD, mod I. Remained in standing for at least 2 minutes prior to ambulation to allow for spO2 assessment/BP assessment, WFLs. Pt on 3L throughout mobility, spO2 lowest reading 88% upon returning to the chair. Pt with very slow gait. Stopped every two to three steps due to pt reported "wobbliness" some unsteadiness noted. Pt able to self correct and pace self appropriately.  Overall the patient demonstrated deficits in balance, endurance, and activity tolerance that impede the patient's functional abilities, safety, and mobility and would benefit from skilled PT intervention. Current recommendation is HHPT and supervision for mobility/OOB. If patient does not qualify for HHPT, would recommend outpatient PT.       Follow Up Recommendations Home health PT;Supervision for mobility/OOB    Equipment Recommendations  None recommended by PT    Recommendations for Other Services       Precautions / Restrictions Precautions Precautions: Fall Precaution Comments:  watch O2 Restrictions Weight Bearing Restrictions: No      Mobility  Bed Mobility               General bed mobility comments: pt up in recliner at start of session  Transfers Overall transfer level: Independent Equipment used: None                Ambulation/Gait Ambulation/Gait assistance: Modified independent (Device/Increase time) Gait Distance (Feet): 55 Feet Assistive device: None   Gait velocity: decreased   General Gait Details: Pt with very slow gait. Stopped every two to three steps due to pt reported "wobbliness" some unsteadiness noted. Pt able to self correct and pace self appropriately.  Stairs            Wheelchair Mobility    Modified Rankin (Stroke Patients Only)       Balance Overall balance assessment: Mild deficits observed, not formally tested                                           Pertinent Vitals/Pain Pain Assessment: No/denies pain(did complain of L hip pain with MMT due to pt reported strain of groin from a few weeks ago)    [place] expects to be discharged to:: Private residence Living Arrangements: Spouse/significant other Available Help at Discharge: Family Type of Home: House Home Access: Stairs to enter Entrance Stairs-Rails: Right Entrance Stairs-Number of Steps: 2 Home Layout: Two level Home Equipment: Shower seat Additional Comments: needs O2    Prior Function Level of Independence: Independent with assistive  device(s)               Hand Dominance        Extremity/Trunk Assessment   Upper Extremity Assessment Upper Extremity Assessment: Overall WFL for tasks assessed    Lower Extremity Assessment Lower Extremity Assessment: RLE deficits/detail;LLE deficits/detail RLE Deficits / Details: grossly 4/5 LLE Deficits / Details: grossly 4/5, except hip flexion 3+/5 and painful       Communication   Communication: No difficulties  Cognition  Arousal/Alertness: Awake/alert Behavior During Therapy: WFL for tasks assessed/performed Overall Cognitive Status: Within Functional Limits for tasks assessed                                        General Comments      Exercises Other Exercises Other Exercises: Pt educated on importance of continued mobility, up in chair for all meals, sitting forward to engage trunk muscles, change LE positioning. Educated on exercise program as well, pt and family verbalized understanding Other Exercises: BP assessed in sitting and standing, 110/68 and 110/74 after standing 2 minutes   Assessment/Plan    PT Assessment Patient needs continued PT services  PT Problem List Cardiopulmonary status limiting activity       PT Treatment Interventions Balance training;Gait training;Neuromuscular re-education;Therapeutic activities;Functional mobility training;Therapeutic exercise;Patient/family education    PT Goals (Current goals can be found in the Care Plan section)  Acute Rehab PT Goals Patient Stated Goal: to go home PT Goal Formulation: With patient Time For Goal Achievement: 09/04/19 Potential to Achieve Goals: Good    Frequency Min 2X/week   Barriers to discharge        Co-evaluation               AM-PAC PT "6 Clicks" Mobility  Outcome Measure Help needed turning from your back to your side while in a flat bed without using bedrails?: None Help needed moving from lying on your back to sitting on the side of a flat bed without using bedrails?: None Help needed moving to and from a bed to a chair (including a wheelchair)?: A Little Help needed standing up from a chair using your arms (e.g., wheelchair or bedside chair)?: A Little Help needed to walk in hospital room?: A Little Help needed climbing 3-5 steps with a railing? : A Little 6 Click Score: 20    End of Session Equipment Utilized During Treatment: Gait belt;Oxygen(3L) Activity Tolerance: Patient  tolerated treatment well Patient left: in chair;with call bell/phone within reach Nurse Communication: Mobility status PT Visit Diagnosis: Difficulty in walking, not elsewhere classified (R26.2)    Time: [id number] PT Time Calculation (min) (ACUTE ONLY): 25 min   Charges:   PT Evaluation $PT Eval Moderate Complexity: 1 Mod PT Treatments $Therapeutic Activity: 8-22 mins        Lieutenant Diego PT, DPT 2:39 PM,08/21/19

## 2019-08-21 NOTE — Progress Notes (Signed)
CRITICAL CARE PROGRESS NOTE    Name: Ashley Molina MRN: [id number] DOB: 1961-05-24  Referring physician : Dr Janese Banks    LOS: 3   SUBJECTIVE FINDINGS & SIGNIFICANT EVENTS   Patient description:  [name] a58 y.o.Caucasian femalewith a known history of breast cancer with lung metastasis status post lumpectomy, chemotherapy and radiotherapy, COVID-19 in December 2020, dyslipidemia and anemia, who presented to [facility] worsening dyspnea with associated cough with inability to expectorate as well as wheezing today. The patient was just discharged from here on 3/10 after being treated for acute on chronic respiratory failure requiring BiPAP. No nausea or vomiting or abdominal pain. She denies any chest pain or palpitations. No fever or chills. No dysuria, oliguria or hematuria or flank pain.  Upon presentation to the emergency room, heart rate was 111 has gone up to 142 with otherwise normal vital signs. EKG showed atrial fibrillation/flutter with a rate of 165. Respiratory clear unless 25-28. Labs revealed potassium of 3.6 magnesium 2.2 anion gap of 15. BNP was 400. High-sensitivity troponin I was 38 and later 98 and CBC showed leukocytosis of 13.9. Influenza antigens and COVID-19 PCR came back negative. Chest x-ray showed findings representing pulmonary edema with pneumonia not excluded.  Lines / Drains: PIVx2  Cultures / Sepsis markers: Resp cx   Antibiotics: Vancomycin, zosyn, levofloxacin   Protocols / Consultants: Pulm/onc/hospitalist  08/20/19 - Discussed care plan with Dr Patsey Berthold (patients primary pulmonologist) - possible bronchoscopy with BAL next week post d/c., today patient reports clinical improvement although she has had few admissions in past month with similar  improvement followed by regression at this point.  She has recently noted pattern of worsening during taper of steroids and has been on Solumedrol 40 TID >BID3/16/21>daily 08/20/19 today so we will continue to monitor her at this time since we are again now tapering her steroids.  3/18- patient has clinically improved slightly, she is still on 3L/min [place] but spO2 is >95%. She is optimized for transfer to medical floor.  Discussed care plan with primary pulmonologist Dr Patsey Berthold as well as oncologist Dr Janese Banks today.   PAST MEDICAL HISTORY   Past Medical History:  Diagnosis Date  . Anemia   . Breast cancer ([place])   . Breast cancer, right ([place]) 04/2017   Hx Lumpectomy, Chemo + Rad tx's.  . Breast cyst, right    aspirated by Dr. Bary Castilla  . COVID-19   . Hyperlipidemia   . Personal history of chemotherapy   . Pre-diabetes      SURGICAL HISTORY   Past Surgical History:  Procedure Laterality Date  . AXILLARY LYMPH NODE BIOPSY Right 04/09/2017   Procedure: AXILLARY LYMPH NODE BIOPSY;  Surgeon: Robert Bellow, MD;  Location: [facility] ORS;  Service: General;  Laterality: Right;  . BREAST BIOPSY Right 03/27/2017   US guided breast mass - invasive mammary carcinoma  . BREAST BIOPSY Right 03/27/2017   Lymph node - metastatic carcinoma  . BREAST BIOPSY Right 04/09/2017   Lymph node   . BREAST CYST ASPIRATION Right 11/2009   Dr. Bary Castilla did FNA  . BREAST EXCISIONAL BIOPSY Right 09/28/2017   lumpectomy and 12 lympnode rad neo adj chemo  . COLONOSCOPY  2011  . DILATION AND CURETTAGE OF UTERUS     X3  . ENDOBRONCHIAL ULTRASOUND N/A 04/09/2017   Procedure: ENDOBRONCHIAL ULTRASOUND;  Surgeon: Laverle Hobby, MD;  Location: [facility] ORS;  Service: Pulmonary;  Laterality: N/A;  . ENDOMETRIAL ABLATION    . ENDOMETRIAL BIOPSY  09/2009  . PORTACATH PLACEMENT Left 04/09/2017   Procedure: INSERTION PORT-A-CATH;  Surgeon: Robert Bellow, MD;  Location: [facility] ORS;  Service: General;  Laterality: Left;      FAMILY HISTORY   Family History  Problem Relation Age of Onset  . Melanoma Maternal Grandmother 64       currently 72  . Brain cancer Maternal Grandfather 75       unk. type; deceased in 33s  . Melanoma Other 37       mat grandmother's father  . Melanoma Father 47       on head; currently 64  . Prostate cancer Maternal Uncle        3 maternal uncles; dx in 37s  . Breast cancer Other        mat grandfather's sister; dx 29s     SOCIAL HISTORY   Social History   Tobacco Use  . Smoking status: Never Smoker  . Smokeless tobacco: Never Used  Substance Use Topics  . Alcohol use: Not Currently  . Drug use: No     MEDICATIONS   Current Medication:  Current Facility-Administered Medications:  .  0.9 %  sodium chloride infusion, 250 mL, Intravenous, PRN, Mansy, Jan A, MD .  acetaminophen (TYLENOL) tablet 650 mg, 650 mg, Oral, Q4H PRN, Mansy, Jan A, MD .  aspirin EC tablet 81 mg, 81 mg, Oral, Daily, Mansy, Jan A, MD, 81 mg at 08/21/19 1610 .  azithromycin (ZITHROMAX) 500 mg in sodium chloride 0.9 % 250 mL IVPB, 500 mg, Intravenous, Q24H, Mansy, Arvella Merles, MD, Stopped at 08/21/19 0751 .  budesonide (PULMICORT) nebulizer solution 0.5 mg, 0.5 mg, Nebulization, BID, Mansy, Jan A, MD, 0.5 mg at 08/21/19 0807 .  cefTRIAXone (ROCEPHIN) 2 g in sodium chloride 0.9 % 100 mL IVPB, 2 g, Intravenous, Q24H, Mansy, Jan A, MD, Last Rate: 200 mL/hr at 08/21/19 0613, 2 g at 08/21/19 9604 .  Chlorhexidine Gluconate Cloth 2 % PADS 6 each, 6 each, Topical, Daily, Elodia Florence., MD, 6 each at 08/18/19 1401 .  cholecalciferol (VITAMIN D3) tablet 1,000 Units, 1,000 Units, Oral, QHS, Mansy, Arvella Merles, MD, 1,000 Units at 08/20/19 2207 .  diltiazem (CARDIZEM) tablet 60 mg, 60 mg, Oral, Q6H, Teodoro Spray, MD, 60 mg at 08/21/19 1139 .  enoxaparin (LOVENOX) injection 40 mg, 40 mg, Subcutaneous, Q24H, Mansy, Jan A, MD, 40 mg at 08/21/19 0609 .  ferrous sulfate tablet 325 mg, 325 mg, Oral, QHS, Mansy,  Jan A, MD, 325 mg at 08/20/19 2208 .  furosemide (LASIX) tablet 20 mg, 20 mg, Oral, BID, Patel, Gretta Cool, MD .  guaiFENesin-dextromethorphan (ROBITUSSIN DM) 100-10 MG/5ML syrup 15 mL, 15 mL, Oral, Q4H PRN, Mansy, Jan A, MD .  ipratropium (ATROVENT) nebulizer solution 0.5 mg, 0.5 mg, Nebulization, Q6H, Elodia Florence., MD, 0.5 mg at 08/21/19 1339 .  ipratropium-albuterol (DUONEB) 0.5-2.5 (3) MG/3ML nebulizer solution 3 mL, 3 mL, Nebulization, Once, Blake Divine, MD .  levalbuterol [facility]) nebulizer solution 0.63 mg, 0.63 mg, Nebulization, Q6H, Elodia Florence., MD, 0.63 mg at 08/21/19 1339 .  LORazepam (ATIVAN) tablet 0.5 mg, 0.5 mg, Oral, Q6H PRN, Borders, Vonna Kotyk R, NP, 0.5 mg at 08/19/19 1132 .  methylPREDNISolone (MEDROL DOSEPAK) tablet 4 mg, 4 mg, Oral, PC supper, Para Skeans, MD .  Derrill Memo ON 08/22/2019] methylPREDNISolone (MEDROL DOSEPAK) tablet 4 mg, 4 mg, Oral, 3 x daily with food, Para Skeans, MD .  Derrill Memo ON 08/23/2019] methylPREDNISolone (MEDROL  DOSEPAK) tablet 4 mg, 4 mg, Oral, 4X daily taper, Posey Pronto, Ekta V, MD .  methylPREDNISolone (MEDROL DOSEPAK) tablet 8 mg, 8 mg, Oral, Nightly, Para Skeans, MD .  Derrill Memo ON 08/22/2019] methylPREDNISolone (MEDROL DOSEPAK) tablet 8 mg, 8 mg, Oral, Nightly, Patel, Ekta V, MD .  metoprolol tartrate (LOPRESSOR) tablet 25 mg, 25 mg, Oral, Once, Blake Divine, MD, Stopped at 08/18/19 0424 .  morphine 2 MG/ML injection 1-2 mg, 1-2 mg, Intravenous, Q2H PRN, Borders, Kirt Boys, NP .  multivitamin with minerals tablet 1 tablet, 1 tablet, Oral, QHS, Mansy, Jan A, MD, 1 tablet at 08/20/19 2207 .  omega-3 acid ethyl esters (LOVAZA) capsule 1 g, 1 g, Oral, Daily, Mansy, Jan A, MD, 1 g at 08/21/19 0834 .  ondansetron (ZOFRAN) injection 4 mg, 4 mg, Intravenous, Q6H PRN, Mansy, Jan A, MD .  pantoprazole (PROTONIX) EC tablet 20 mg, 20 mg, Oral, Daily, Mansy, Jan A, MD, 20 mg at 08/21/19 0834 .  sodium chloride flush (NS) 0.9 % injection 3 mL, 3 mL,  Intravenous, Q12H, Mansy, Jan A, MD, 3 mL at 08/21/19 0842 .  sodium chloride flush (NS) 0.9 % injection 3 mL, 3 mL, Intravenous, PRN, Mansy, Jan A, MD .  sulfamethoxazole-trimethoprim (BACTRIM DS) 800-160 MG per tablet 1 tablet, 1 tablet, Oral, Once per day on Mon Wed Fri, Mansy, Jan A, MD, 1 tablet at 08/20/19 6720    ALLERGIES   Penicillins    REVIEW OF SYSTEMS     10 point ROS done and is negative except as per subjective findings  PHYSICAL EXAMINATION   Vital Signs: Temp:  [97.8 F (36.6 C)-98.2 F (36.8 C)] 97.8 F (36.6 C) (03/18 1300) Pulse Rate:  [83-102] 91 (03/18 1500) Resp:  [17-28] 28 (03/18 1500) BP: (83-118)/(45-102) 102/62 (03/18 1500) SpO2:  [90 %-99 %] 95 % (03/18 1500) FiO2 (%):  [32 %-35 %] 35 % (03/18 0105) Weight:  [92.3 kg] 92.3 kg (03/18 0424)  GENERAL:mild distress due to hypoxemia HEAD: Normocephalic, atraumatic.  EYES: Pupils equal, round, reactive to light.  No scleral icterus.  MOUTH: Moist mucosal membrane. NECK: Supple. No thyromegaly. No nodules. No JVD.  PULMONARY: mild rhonchorous breath sounds bilaterally  CARDIOVASCULAR: S1 and S2. Regular rate and rhythm. No murmurs, rubs, or gallops.  GASTROINTESTINAL: Soft, nontender, non-distended. No masses. Positive bowel sounds. No hepatosplenomegaly.  MUSCULOSKELETAL: No swelling, clubbing, or edema.  NEUROLOGIC: Mild distress due to acute illness SKIN:intact,warm,dry   PERTINENT DATA     Infusions: . sodium chloride    . azithromycin Stopped (08/21/19 0751)  . cefTRIAXone (ROCEPHIN)  IV 2 g (08/21/19 9470)   Scheduled Medications: . aspirin EC  81 mg Oral Daily  . budesonide  0.5 mg Nebulization BID  . Chlorhexidine Gluconate Cloth  6 each Topical Daily  . cholecalciferol  1,000 Units Oral QHS  . diltiazem  60 mg Oral Q6H  . enoxaparin (LOVENOX) injection  40 mg Subcutaneous Q24H  . ferrous sulfate  325 mg Oral QHS  . furosemide  20 mg Oral BID  . ipratropium  0.5 mg  Nebulization Q6H  . ipratropium-albuterol  3 mL Nebulization Once  . levalbuterol  0.63 mg Nebulization Q6H  . methylPREDNISolone  4 mg Oral PC supper  . [START ON 08/22/2019] methylPREDNISolone  4 mg Oral 3 x daily with food  . [START ON 08/23/2019] methylPREDNISolone  4 mg Oral 4X daily taper  . methylPREDNISolone  8 mg Oral Nightly  . [START ON 08/22/2019] methylPREDNISolone  8  mg Oral Nightly  . metoprolol tartrate  25 mg Oral Once  . multivitamin with minerals  1 tablet Oral QHS  . omega-3 acid ethyl esters  1 g Oral Daily  . pantoprazole  20 mg Oral Daily  . sodium chloride flush  3 mL Intravenous Q12H  . sulfamethoxazole-trimethoprim  1 tablet Oral Once per day on Mon Wed Fri   PRN Medications: sodium chloride, acetaminophen, guaiFENesin-dextromethorphan, LORazepam, morphine, ondansetron (ZOFRAN) IV, sodium chloride flush Hemodynamic parameters:   Intake/Output: 03/17 0701 - 03/18 0700 In: 350 [IV Piggyback:350] Out: 2050 [Urine:1400; Stool:650]  Ventilator  Settings: FiO2 (%):  [32 %-35 %] 35 %     LAB RESULTS:  Basic Metabolic Panel: Recent Labs  Lab 08/15/19 0639 08/15/19 0639 08/16/19 0601 08/16/19 0601 08/18/19 0045 08/18/19 0045 08/19/19 0444 08/20/19 0624  NA 136  --  136  --  139  --  141 138  K 3.6   < > 4.0   < > 3.6   < > 3.9 3.2*  CL 99  --  97*  --  96*  --  96* 93*  CO2 27  --  30  --  28  --  31 32  GLUCOSE 129*  --  129*  --  131*  --  142* 140*  BUN 32*  --  25*  --  30*  --  32* 34*  CREATININE 0.89  --  0.74  --  0.74  --  0.81 0.72  CALCIUM 9.5  --  9.3  --  9.9  --  10.1 9.7  MG 2.1  --  2.2  --  2.2  --  2.4 2.3  PHOS  --   --  3.4  --   --   --  4.3 3.9   < > = values in this interval not displayed.   Liver Function Tests: Recent Labs  Lab 08/18/19 0045 08/19/19 0444 08/20/19 0624  AST _0 ALT _1 ALKPHOS 105 88 80  BILITOT 0.9 0.8 1.0  PROT 6.9 6.3* 6.0*  ALBUMIN 4.0 3.4* 3.3*   No results for input(s): LIPASE,  AMYLASE in the last 168 hours. No results for input(s): AMMONIA in the last 168 hours. CBC: Recent Labs  Lab 08/16/19 0601 08/18/19 0004 08/19/19 0444 08/20/19 0624 08/21/19 1311  WBC 10.4 13.9* 14.7* 12.9* 17.6*  NEUTROABS  --   --  13.3* 12.0* 15.2*  HGB 11.6* 13.5 12.1 12.1 13.0  HCT 36.5 44.2 39.0 36.8 39.8  MCV 91.5 94.2 91.5 89.5 89.0  PLT 191 181 186 188 224   Cardiac Enzymes: No results for input(s): CKTOTAL, CKMB, CKMBINDEX, TROPONINI in the last 168 hours. BNP: Invalid input(s): POCBNP CBG: Recent Labs  Lab 08/18/19 1039  GLUCAP 128*     IMAGING RESULTS:  Imaging: CT Chest High Resolution  Result Date: 08/21/2019 CLINICAL DATA:  Hypoxemia. COVID-19 in December. Breast cancer with lung metastasis status post chemotherapy and radiotherapy. EXAM: CT CHEST WITHOUT CONTRAST TECHNIQUE: Multidetector CT imaging of the chest was performed following the standard protocol without intravenous contrast. High resolution imaging of the lungs, as well as inspiratory and expiratory imaging, was performed. COMPARISON:  08/18/2019 chest radiograph. 08/06/2019 chest CT angiogram. FINDINGS: Cardiovascular: Normal heart size. Trace pericardial effusion/thickening is stable. Left subclavian Port-A-Cath terminates at the cavoatrial junction. Mildly atherosclerotic nonaneurysmal thoracic aorta. Dilated main pulmonary artery (3.5 cm diameter). Mediastinum/Nodes: No discrete thyroid nodules. Unremarkable esophagus. Surgical clips again noted in  the right axilla. No pathologically enlarged axillary lymph nodes. No discrete mediastinal adenopathy. Lungs/Pleura: No pneumothorax. Small dependent right and trace dependent left pleural effusions are unchanged. There is extensive patchy irregular thickening of the peribronchovascular interstitium and interlobular septa throughout both lungs, most prominent in the left lower lobe, which appears to be progressively worsening on multiple chest CT studies  back to 04/04/2019. Central perihilar prominent interstitial thickening also appears progressively increased back to 04/04/2019 CT. Patchy ground-glass opacities throughout both lungs have worsened. Similarly, there is extensive patchy sub solid pulmonary nodularity throughout both lungs with progressive enlargement back to 07/09/2019 chest CT. Representative basilar right lower lobe 1.8 cm nodule (series 3/image 127), previously 0.6 cm on 07/09/2019 and 1.4 cm on 08/06/2019 CT. Representative right upper lobe 1.1 cm nodule (series 3/image 41), new. Medial left lower lobe 1.7 cm nodule (series 3/image 113), previously 0.9 cm on 07/09/2019 and 1.1 cm on 08/06/2019. No significant regions of bronchiectasis. No honeycombing. Moderate patchy air trapping in both lungs on the expiration sequence. Upper abdomen: No acute abnormality. Musculoskeletal: No aggressive appearing focal osseous lesions. Mild thoracic spondylosis. IMPRESSION: 1. Extensive patchy irregular peribronchovascular and interlobular septal thickening, patchy ground-glass opacities, subsolid nodularity and irregular perihilar consolidation throughout both lungs, all progressively worsening on multiple chest CT studies back to 04/04/2019. 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 2. Stable small dependent right and trace dependent left pleural effusions. 3. Moderate patchy air trapping in both lungs, indicative of small airways disease. 4. Dilated main pulmonary artery, suggesting pulmonary arterial hypertension. 5. Aortic Atherosclerosis (ICD10-I70.0). Electronically Signed   By: Ilona Sorrel M.D.   On: 08/21/2019 12:47       ASSESSMENT AND PLAN    -Multidisciplinary rounds held today  Acute Hypoxic Respiratory Failure -patient has airspace and interstitial opaification as well as interstitial septal thickening on CT chest 08/07/19 as  above - recent re-hospitalization with increased O2 requirement -absence of PE on above CT PE -patient has been diuresed and is negative 2L -agree with CAP coverage - currently on Rocephin zithromax -patient has been on prolonged steroids - possible fungal etiology - ordered fungitell and aspergillus ab serum -additionally possible metastatic lesions,  possible gemcitabine pnemonitis although this generally does improve with steroids -wean supplemental O2 as able -remains on 5L [place] -RT for chest physiotherapy - will start MetaNEB today  ID -continue IV abx as prescibed -follow up cultures  GI/Nutrition GI PROPHYLAXIS as indicated DIET-->TF's as tolerated Constipation protocol as indicated  ENDO - ICU hypoglycemic\Hyperglycemia protocol -check FSBS per protocol   ELECTROLYTES -follow labs as needed -replace as needed -pharmacy consultation   DVT/GI PRX ordered -SCDs  TRANSFUSIONS AS NEEDED MONITOR FSBS ASSESS the need for LABS as needed   Critical care provider statement:    Critical care time (minutes):  33   Critical care time was exclusive of:  Separately billable procedures and treating other patients   Critical care was necessary to treat or prevent imminent or life-threatening deterioration of the following conditions:  acute hypoxemic respiratory failure, breast ca, multiple comorbid conditions   Critical care was time spent personally by me on the following activities:  Development of treatment plan with patient or surrogate, discussions with consultants, evaluation of patient's response to treatment, examination of patient, obtaining history from patient or surrogate, ordering and performing treatments and interventions, ordering and review of laboratory studies and re-evaluation of patient's condition.  I assumed  direction of critical care for this patient from another provider in my specialty: no    This document was prepared using Dragon voice recognition software  and may include unintentional dictation errors.    Ottie Glazier, M.D.  Division of [facility]

## 2019-08-22 DIAGNOSIS — J449 Chronic obstructive pulmonary disease, unspecified: Secondary | ICD-10-CM

## 2019-08-22 LAB — CBC WITH DIFFERENTIAL/PLATELET
Abs Immature Granulocytes: 0.12 10*3/uL — ABNORMAL HIGH (ref 0.00–0.07)
Basophils Absolute: 0 10*3/uL (ref 0.0–0.1)
Basophils Relative: 0 %
Eosinophils Absolute: 0 10*3/uL (ref 0.0–0.5)
Eosinophils Relative: 0 %
HCT: 37.5 % (ref 36.0–46.0)
Hemoglobin: 11.9 g/dL — ABNORMAL LOW (ref 12.0–15.0)
Immature Granulocytes: 1 %
Lymphocytes Relative: 4 %
Lymphs Abs: 0.4 10*3/uL — ABNORMAL LOW (ref 0.7–4.0)
MCH: 28.5 pg (ref 26.0–34.0)
MCHC: 31.7 g/dL (ref 30.0–36.0)
MCV: 89.9 fL (ref 80.0–100.0)
Monocytes Absolute: 0.6 10*3/uL (ref 0.1–1.0)
Monocytes Relative: 6 %
Neutro Abs: 9.1 10*3/uL — ABNORMAL HIGH (ref 1.7–7.7)
Neutrophils Relative %: 89 %
Platelets: 204 10*3/uL (ref 150–400)
RBC: 4.17 MIL/uL (ref 3.87–5.11)
RDW: 15.2 % (ref 11.5–15.5)
WBC: 10.3 10*3/uL (ref 4.0–10.5)
nRBC: 0 % (ref 0.0–0.2)

## 2019-08-22 LAB — COMPREHENSIVE METABOLIC PANEL
ALT: 21 U/L (ref 0–44)
AST: 22 U/L (ref 15–41)
Albumin: 3.3 g/dL — ABNORMAL LOW (ref 3.5–5.0)
Alkaline Phosphatase: 80 U/L (ref 38–126)
Anion gap: 13 (ref 5–15)
BUN: 33 mg/dL — ABNORMAL HIGH (ref 6–20)
CO2: 31 mmol/L (ref 22–32)
Calcium: 9.8 mg/dL (ref 8.9–10.3)
Chloride: 91 mmol/L — ABNORMAL LOW (ref 98–111)
Creatinine, Ser: 0.7 mg/dL (ref 0.44–1.00)
GFR calc Af Amer: >60 mL/min (ref >60)
GFR calc non Af Amer: >60 mL/min (ref >60)
Glucose, Bld: 135 mg/dL — ABNORMAL HIGH (ref 70–99)
Potassium: 3.3 mmol/L — ABNORMAL LOW (ref 3.5–5.1)
Sodium: 135 mmol/L (ref 135–145)
Total Bilirubin: 0.8 mg/dL (ref 0.3–1.2)
Total Protein: 6 g/dL — ABNORMAL LOW (ref 6.5–8.1)

## 2019-08-22 LAB — FUNGITELL, SERUM: Fungitell Result: <31 pg/mL (ref <80)

## 2019-08-22 LAB — MAGNESIUM: Magnesium: 2.4 mg/dL (ref 1.7–2.4)

## 2019-08-22 LAB — ASPERGILLUS ANTIGEN, BAL/SERUM: Aspergillus Ag, BAL/Serum: 0.03 Index (ref 0.00–0.49)

## 2019-08-22 LAB — PHOSPHORUS: Phosphorus: 3.1 mg/dL (ref 2.5–4.6)

## 2019-08-22 MED ORDER — MORPHINE SULFATE (CONCENTRATE) 20 MG/ML PO SOLN: 5.0000 mg | ORAL | 0 refills | Status: DC | PRN

## 2019-08-22 MED ORDER — POTASSIUM CHLORIDE CRYS ER 20 MEQ PO TBCR
20.0000 meq | EXTENDED_RELEASE_TABLET | Freq: Every day | ORAL | Status: DC
  Administered 2019-08-22 – 2019-08-25 (×4): 20 meq via ORAL
  Filled 2019-08-22 (×4): qty 1

## 2019-08-22 MED ORDER — METHYLPREDNISOLONE SODIUM SUCC 125 MG IJ SOLR
60.0000 mg | INTRAMUSCULAR | Status: DC
  Administered 2019-08-22 – 2019-08-24 (×11): 60 mg via INTRAVENOUS
  Filled 2019-08-22 (×11): qty 2

## 2019-08-22 MED ORDER — LORAZEPAM 0.5 MG PO TABS: 0.5000 mg | ORAL_TABLET | ORAL | 0 refills | Status: AC | PRN

## 2019-08-22 MED ORDER — DILTIAZEM HCL ER COATED BEADS 240 MG PO CP24
240.0000 mg | ORAL_CAPSULE | Freq: Every day | ORAL | Status: DC
  Administered 2019-08-22 – 2019-08-23 (×2): 240 mg via ORAL
  Filled 2019-08-22 (×3): qty 1

## 2019-08-22 NOTE — TOC Progression Note (Signed)
Transition of Care [facility]) - Progression Note    Patient Details  Name: Ashley Molina MRN: [id number] Date of Birth: [birth year]  Transition of Care [facility]) CM/SW Contact  Laketa Sandoz, Gardiner Rhyme, LCSW Phone Number: 08/22/2019, 1:42 PM  Clinical Narrative: Met with pt and husband who is in her room to discuss hospice and going home with. She reports it is somewhat overwhelming and she is still trying to process all of this. They both want to use [facility] and if later may want hospice home. Husband is a strong support of pt and will assist. Has O2 and home will need hospital bed and has a BSC. Have asked [facility] to meet with both while here. Continue to work on discharge home with hospice.      Expected Discharge Plan: Home w Hospice Care Barriers to Discharge: Barriers Resolved  Expected Discharge Plan and Services Expected Discharge Plan: [place] arrangements for the past 2 months: Single Family Home                                       Social Determinants of Health (SDOH) Interventions    Readmission Risk Interventions Readmission Risk Prevention Plan 08/19/2019 08/15/2019  Transportation Screening Complete Complete  PCP or Specialist Appt within 3-5 Days Patient refused Patient refused  [place] or Home Care Consult Complete Complete  Medication Review (RN Care Manager) Complete Complete  Some recent data might be hidden

## 2019-08-22 NOTE — Progress Notes (Signed)
PROGRESS NOTE    Ashley Molina  RUE:[id number] DOB: August 12, 1960 DOA: 08/17/2019 PCP: Patient, No Pcp Per   Brief Narrative:  Ashley Molina  is a 59 y.o. Caucasian female with a known history of breast cancer with lung metastasis status post lumpectomy, chemotherapy and radiotherapy, COVID-19 in December 2020, dyslipidemia and anemia, who presented to [facility] worsening dyspnea with associated cough with inability to expectorate as well as wheezing today.  The patient was just discharged from here on 3/10 after being treated for acute on chronic respiratory failure requiring BiPAP.  No nausea or vomiting or abdominal pain.  She denies any chest pain or palpitations.  No fever or chills.  No dysuria, oliguria or hematuria or flank pain.  Upon presentation to the emergency room, heart rate was 111 has gone up to 142 with otherwise normal vital signs.  EKG showed atrial fibrillation/flutter with a rate of 165.  Respiratory clear unless 25-28.  Labs revealed potassium of 3.6 magnesium 2.2 anion gap of 15.  BNP was 400.  High-sensitivity troponin I was 38 and later 98 and CBC showed leukocytosis of 13.9.  Influenza antigens and COVID-19 PCR came back negative.  Chest x-ray showed findings representing pulmonary edema with pneumonia not excluded.  She's currently admitted for COPD exacerbation and is being treated with lasix for pulmonary edema/heart failure as well.   Assessment & Plan:   Active Problems:   COPD exacerbation ([place])   Palliative care encounter  1.  Acute Hypoxic Respiratory Failure  COPD acute exacerbation with subsequent acute cor pulmonale with mild interstitial pulmonary edema. - CXR 3/15 with edema - Steroids, scheduled and prn nebs, pulmicort - lasix changed to po 20 mg bid. -She will be placed on scheduled and as needed duo nebs. 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  Hx of covid and radiation induced pneumonitis as well.  Multiple possible contributing factors.  Will c/s pulm to follow. -pt is on oxygen via 4 l [employer] [place] [place] currently and her saturation are high 90"s and exam is CLTABL. 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 D/w her about this result and she is calm and sad. I have given her morphine which has alleviated  her sob and anxiety.    2. Sinus Tachycardia with PAC vs Fib/Flutter  Elevated Troponin - some question of possible afib/flutter, though suspect this is sinus tach with PAC's - will consult cardiology with elevated troponin and tachyarrythmia, suspect this is demand ischemia - recent echo with normal EF -continue diltiazem.  3.  GERD. -PPI therapy continued  4.  Hypertension. -continued on diltiazem. -BP soft due to additional iv diuretic therapy   5.  DVT prophylaxis. -Subcutaneous Lovenox  6.Hypokalemia: pt given 20 meq of kcl iv x1.    DVT prophylaxis: lovenox Code Status: full Family Communication: none at bedside Disposition Plan:  . Patient came from: home            . Anticipated d/c place: home . Barriers to d/c OR conditions which need to be met to effect a safe d/c: pending improvement in resp status . Anticipate d/c in am.  Consultants:   Oncology  Intensivist.  Cardiology Procedures:   none  Antimicrobials:  Anti-infectives (  From admission, onward)   Start     Dose/Rate Route Frequency Ordered Stop   08/18/19 0900  sulfamethoxazole-trimethoprim (BACTRIM DS) 800-160 MG per tablet 1 tablet     1 tablet Oral Once per day on Mon Wed Fri 08/18/19 0622     08/18/19 0700  cefTRIAXone (ROCEPHIN) 2 g in sodium chloride 0.9 % 100 mL IVPB     2  g 200 mL/hr over 30 Minutes Intravenous Every 24 hours 08/18/19 0647     08/18/19 0700  azithromycin (ZITHROMAX) 500 mg in sodium chloride 0.9 % 250 mL IVPB     500 mg 250 mL/hr over 60 Minutes Intravenous Every 24 hours 08/18/19 0647       Subjective: SOB improved today.  08/20/2019: Blood pressure 104/67, pulse 88, temperature 98.5 F (36.9 C), temperature source Oral, resp. rate 16, height _0  (1.6 m), weight 86.2 kg, last menstrual period 04/04/2012, SpO2 93 %.  Pt is alert awake and coughing after taking deep breaths. She has IS at bedside.  She Denies any other complaints.   08/21/19: Pt is alert and sitting on bedside recliner. She is on 4 Lnc and is comfortable with little sob / cough. Labs today show hypokalemia due to diuretic therapy, and wbc count of 12.9 down from 14/7.  3/19 Today pt was sob upon my arrival to her room and has to be restarted on her steroid therapy , Morphine given to pt for sob and both alleviated her sob , later on checking on pt she was sitting on recliner and  Stable, says she is breathing better d/w her results of her ct and she is calm and sad.   Objective: Vitals:   08/22/19 0644 08/22/19 0826 08/22/19 1039 08/22/19 1525  BP:  101/71 131/81 104/67  Pulse:  91 (!) 105 88  Resp:  _1 Temp:  98.6 F (37 C) 98.2 F (36.8 C) 98.5 F (36.9 C)  TempSrc:  Oral Oral Oral  SpO2:  94% 93% 93%  Weight: 86.2 kg     Height:        Intake/Output Summary (Last 24 hours) at 08/22/2019 1820 Last data filed at 08/22/2019 1500 Gross per 24 hour  Intake 123 ml  Output 600 ml  Net -477 ml   Filed Weights   08/20/19 0500 08/21/19 0424 08/22/19 0644  Weight: 92.5 kg 92.3 kg 86.2 kg    Examination: Blood pressure 104/67, pulse 88, temperature 98.5 F (36.9 C), temperature source Oral, resp. rate 16, height _2  (1.6 m), weight 86.2 kg, last menstrual period 04/04/2012, SpO2 93 %. General: No acute distress. Cardiovascular: Heart sounds show  a tachycardic rate Lungs: mild wheezing bl on expiration , improved air entry.  Abdomen: Soft, nontender, nondistended Neurological: Alert and oriented 3. Moves all extremities 4. Cranial nerves II through XII grossly intact. Skin: Warm and dry. No rashes or lesions. Extremities: No clubbing or cyanosis.   Data Reviewed:   CBC: Recent Labs  Lab 08/18/19 0004 08/19/19 0444 08/20/19 0624 08/21/19 1311 08/22/19 0251  WBC 13.9* 14.7* 12.9* 17.6* 10.3  NEUTROABS  --  13.3* 12.0* 15.2* 9.1*  HGB 13.5 12.1 12.1 13.0 11.9*  HCT 44.2 39.0 36.8 39.8 37.5  MCV 94.2 91.5 89.5 89.0 89.9  PLT 181 186 188 224 921   Basic Metabolic Panel: Recent Labs  Lab 08/16/19 0601 08/18/19 0045 08/19/19 0444 08/20/19 0624 08/22/19 0251  NA 136 139 141 138 135  K 4.0 3.6 3.9  3.2* 3.3*  CL 97* 96* 96* 93* 91*  CO2 _0 32 31  GLUCOSE 129* 131* 142* 140* 135*  BUN 25* 30* 32* 34* 33*  CREATININE 0.74 0.74 0.81 0.72 0.70  CALCIUM 9.3 9.9 10.1 9.7 9.8  MG 2.2 2.2 2.4 2.3 2.4  PHOS 3.4  --  4.3 3.9 3.1   GFR: Estimated Creatinine Clearance: 77.8 mL/min (by C-G formula based on SCr of 0.7 mg/dL). Liver Function Tests: Recent Labs  Lab 08/18/19 0045 08/19/19 0444 08/20/19 0624 08/22/19 0251  AST _1 ALT _2 ALKPHOS 105 88 80 80  BILITOT 0.9 0.8 1.0 0.8  PROT 6.9 6.3* 6.0* 6.0*  ALBUMIN 4.0 3.4* 3.3* 3.3*   No results for input(s): LIPASE, AMYLASE in the last 168 hours. No results for input(s): AMMONIA in the last 168 hours. Coagulation Profile: No results for input(s): INR, PROTIME in the last 168 hours. Cardiac Enzymes: No results for input(s): CKTOTAL, CKMB, CKMBINDEX, TROPONINI in the last 168 hours. BNP (last 3 results) No results for input(s): PROBNP in the last 8760 hours. HbA1C: No results for input(s): HGBA1C in the last 72 hours. CBG: Recent Labs  Lab 08/18/19 1039  GLUCAP 128*   Lipid Profile: No results for input(s): CHOL, HDL, LDLCALC, TRIG,  CHOLHDL, LDLDIRECT in the last 72 hours. Thyroid Function Tests: No results for input(s): TSH, T4TOTAL, FREET4, T3FREE, THYROIDAB in the last 72 hours. Anemia Panel: No results for input(s): VITAMINB12, FOLATE, FERRITIN, TIBC, IRON, RETICCTPCT in the last 72 hours. Sepsis Labs: Recent Labs  Lab 08/19/19 0444  PROCALCITON <0.10    Recent Results (from the past 240 hour(s))  Blood Culture (routine x 2)     Status: None   Collection Time: 08/13/19  3:02 PM   Specimen: BLOOD  Result Value Ref Range Status   Specimen Description BLOOD BLOOD LEFT HAND  Final   Special Requests   Final    BOTTLES DRAWN AEROBIC AND ANAEROBIC Blood Culture adequate volume   Culture   Final    NO GROWTH 5 DAYS Performed at [facility], [address], [place], [place] [address]    Report Status 08/18/2019 FINAL  Final  Blood Culture (routine x 2)     Status: None   Collection Time: 08/13/19  3:07 PM   Specimen: BLOOD  Result Value Ref Range Status   Specimen Description BLOOD LEFT ANTECUBITAL  Final   Special Requests   Final    BOTTLES DRAWN AEROBIC AND ANAEROBIC Blood Culture adequate volume   Culture   Final    NO GROWTH 5 DAYS Performed at [facility], [address], [place], [place] [address]    Report Status 08/18/2019 FINAL  Final  SARS CORONAVIRUS 2 (TAT 6-24 HRS) Nasopharyngeal Nasopharyngeal Swab     Status: None   Collection Time: 08/13/19  8:16 PM   Specimen: Nasopharyngeal Swab  Result Value Ref Range Status   SARS Coronavirus 2 NEGATIVE NEGATIVE Final    Comment: (NOTE) SARS-CoV-2 target nucleic acids are NOT DETECTED. The SARS-CoV-2 RNA is generally detectable in upper and lower respiratory specimens during the acute phase of infection. Negative results do not preclude SARS-CoV-2 infection, do not rule out co-infections with other pathogens, and should not be used as the sole basis for treatment or other patient management decisions. Negative results must  be combined with clinical observations, patient history, and epidemiological information. The expected result is Negative. Fact Sheet for  Patients: SugarRoll.be Fact Sheet for Healthcare Providers: https://www.woods-mathews.com/ This test is not yet approved or cleared by the [place] FDA and  has been authorized for detection and/or diagnosis of SARS-CoV-2 by FDA under an Emergency Use Authorization (EUA). This EUA will remain  in effect (meaning this test can be used) for the duration of the COVID-19 declaration under Section 56 4(b)(1) of the Act, 21 U.S.C. section 360bbb-3(b)(1), unless the authorization is terminated or revoked sooner. Performed at [facility], [place] [address] [place], [place] [address]   MRSA PCR Screening     Status: None   Collection Time: 08/13/19 11:28 PM   Specimen: Nasopharyngeal  Result Value Ref Range Status   MRSA by PCR NEGATIVE NEGATIVE Final    Comment:        The GeneXpert MRSA Assay (FDA approved for NASAL specimens only), is one component of a comprehensive MRSA colonization surveillance program. It is not intended to diagnose MRSA infection nor to guide or monitor treatment for MRSA infections. Performed at [facility], [address] [place], [place] [address]   Respiratory Panel by RT PCR (Flu A&B, Covid) - Nasopharyngeal Swab     Status: None   Collection Time: 08/18/19  2:23 AM   Specimen: Nasopharyngeal Swab  Result Value Ref Range Status   SARS Coronavirus 2 by RT PCR NEGATIVE NEGATIVE Final    Comment: (NOTE) SARS-CoV-2 target nucleic acids are NOT DETECTED. The SARS-CoV-2 RNA is generally detectable in upper respiratoy specimens during the acute phase of infection. The lowest concentration of SARS-CoV-2 viral copies this assay can detect is 131 copies/mL. A negative result does not preclude SARS-Cov-2 infection and should not be used as the sole basis for  treatment or other patient management decisions. A negative result may occur with  improper specimen collection/handling, submission of specimen other than nasopharyngeal swab, presence of viral mutation(s) within the areas targeted by this assay, and inadequate number of viral copies (<131 copies/mL). A negative result must be combined with clinical observations, patient history, and epidemiological information. The expected result is Negative. Fact Sheet for Patients:  PinkCheek.be Fact Sheet for Healthcare Providers:  GravelBags.it This test is not yet ap proved or cleared by the [place] FDA and  has been authorized for detection and/or diagnosis of SARS-CoV-2 by FDA under an Emergency Use Authorization (EUA). This EUA will remain  in effect (meaning this test can be used) for the duration of the COVID-19 declaration under Section 564(b)(1) of the Act, 21 U.S.C. section 360bbb-3(b)(1), unless the authorization is terminated or revoked sooner.    Influenza A by PCR NEGATIVE NEGATIVE Final   Influenza B by PCR NEGATIVE NEGATIVE Final    Comment: (NOTE) The Xpert Xpress SARS-CoV-2/FLU/RSV assay is intended as an aid in  the diagnosis of influenza from Nasopharyngeal swab specimens and  should not be used as a sole basis for treatment. Nasal washings and  aspirates are unacceptable for Xpert Xpress SARS-CoV-2/FLU/RSV  testing. Fact Sheet for Patients: PinkCheek.be Fact Sheet for Healthcare Providers: GravelBags.it This test is not yet approved or cleared by the [place] FDA and  has been authorized for detection and/or diagnosis of SARS-CoV-2 by  FDA under an Emergency Use Authorization (EUA). This EUA will remain  in effect (meaning this test can be used) for the duration of the  Covid-19 declaration under Section 564(b)(1) of the Act, 21  U.S.C. section  360bbb-3(b)(1), unless the authorization is  terminated or revoked. Performed at [facility], [address] [place], [place] [address]   CULTURE, BLOOD (ROUTINE X 2) w Reflex to ID Panel     Status: None (Preliminary result)   Collection Time: 08/18/19  8:19 AM   Specimen: BLOOD  Result Value Ref Range Status   Specimen Description BLOOD LEFT HAND   Final   Special Requests   Final    BOTTLES DRAWN AEROBIC AND ANAEROBIC Blood Culture adequate volume   Culture   Final    NO GROWTH 4 DAYS Performed at [facility], [address] [place], [place] [address]    Report Status PENDING  Incomplete  CULTURE, BLOOD (ROUTINE X 2) w Reflex to ID Panel     Status: None (Preliminary result)   Collection Time: 08/18/19  8:24 AM   Specimen: BLOOD  Result Value Ref Range Status   Specimen Description BLOOD LEFT WRIST  Final   Special Requests   Final    BOTTLES DRAWN AEROBIC AND ANAEROBIC Blood Culture adequate volume   Culture   Final    NO GROWTH 4 DAYS Performed at [facility], [address] [place], [place] [address]    Report Status PENDING  Incomplete  Aspergillus Ag, BAL/Serum     Status: None   Collection Time: 08/20/19  6:24 AM   Specimen: Vein  Result Value Ref Range Status   Aspergillus Ag, BAL/Serum 0.03 0.00 - 0.49 Index Final    Comment: (NOTE) Performed At: [facility] [address] [place], [place] [id number] Rush Farmer MD ML:[id number]      Radiology Studies: CTA chest: CLINICAL DATA:  Hemoptysis, history of breast cancer and COVID  EXAM: CT ANGIOGRAPHY CHEST WITH CONTRAST  TECHNIQUE: Multidetector CT imaging of the chest was performed using the standard protocol during bolus administration of intravenous contrast. Multiplanar CT image reconstructions and MIPs were obtained to evaluate the vascular anatomy.  CONTRAST:  70m OMNIPAQUE IOHEXOL 350 MG/ML SOLN  COMPARISON:  July 09, 2019  FINDINGS: Cardiovascular: There is a optimal opacification of the pulmonary arteries. There is no central,segmental, or subsegmental filling defects within the pulmonary arteries. A left-sided MediPort catheter is seen with the tip in the right atrium. The heart is normal in size. No pericardial effusion or thickening. No evidence right heart strain. There is normal three-vessel brachiocephalic anatomy without proximal stenosis. Scattered mild aortic atherosclerosis.  Mediastinum/Nodes: No hilar, mediastinal, or axillary adenopathy. Thyroid gland, trachea, and esophagus demonstrate no significant findings.  Lungs/Pleura: There is new/worsening multifocal patchy predominantly peripherally based ground-glass opacities seen throughout both lungs. There is also slight interval worsening in the tree-in-bud nodular hazy opacities within the left lung apex. There is unchanged spiculated masslike consolidation within the right infrahilar region, likely from post treatment changes/fibrosis. There is a trace right pleural effusion present.  Upper Abdomen: No acute abnormalities present in the visualized portions of the upper abdomen.  Musculoskeletal: No chest wall abnormality. No acute or significant osseous findings. Again noted is diffuse skin thickening seen along the right breast. There is a surgical clips within the right breast.  Review of the MIP images confirms the above findings.  IMPRESSION: 1. No central, segmental, or subsegmental pulmonary embolism. 2. Interval worsening of the multifocal patchy ground-glass opacities throughout both lungs, consistent with multifocal pneumonia. 3. Interval slight worsening in the tree-in-bud nodular opacities within the left upper lobe, likely due to atypical infectious etiology, however cannot exclude underlying metastatic disease. 4. Post treatment radiation/fibrotic changes in the right infrahilar region.  5. Trace right  pleural effusion   Electronically Signed   By: Prudencio Pair M.D.   On: 08/06/2019 00:17  Scheduled Meds: . aspirin EC  81 mg Oral Daily  . budesonide  0.5 mg Nebulization BID  . Chlorhexidine Gluconate Cloth  6 each Topical Daily  . cholecalciferol  1,000 Units Oral QHS  . diltiazem  240 mg Oral Daily  . enoxaparin (LOVENOX) injection  40 mg Subcutaneous Q24H  . ferrous sulfate  325 mg Oral QHS  . furosemide  20 mg Oral BID  . ipratropium  0.5 mg Nebulization Q6H  . ipratropium-albuterol  3 mL Nebulization Once  . levalbuterol  0.63 mg Nebulization Q6H  . methylPREDNISolone (SOLU-MEDROL) injection  60 mg Intravenous Q4H  . multivitamin with minerals  1 tablet Oral QHS  . omega-3 acid ethyl esters  1 g Oral Daily  . pantoprazole  20 mg Oral Daily  . polyethylene glycol  17 g Oral Daily  . potassium chloride  20 mEq Oral Daily  . sodium chloride flush  3 mL Intravenous Q12H  . sulfamethoxazole-trimethoprim  1 tablet Oral Once per day on Mon Wed Fri   Continuous Infusions: . sodium chloride 250 mL (08/22/19 0655)  . azithromycin 500 mg (08/22/19 0934)  . cefTRIAXone (ROCEPHIN)  IV 2 g (08/22/19 0658)     LOS: 4 days    Time spent: over 60 min Para Skeans, MD [facility] To contact the attending provider between 7A-7P or the covering provider during after hours 7P-7A, please log into the web site [email or web address] and access using universal [place] password for that web site. If you do not have the password, please call the hospital operator.  08/22/2019, 6:20 PM

## 2019-08-22 NOTE — Progress Notes (Signed)
MD paged for pt c/o increased work of breathing. 93% on 3L [place]. RN also spoke to RT. MD later to bedside and placed new orders.

## 2019-08-22 NOTE — Progress Notes (Signed)
Allison visited pt. while rounding on ICU; pt. sitting in chair bedside w/husband in another chair.  Pt. shared she has been having trouble breathing --> lung damage possibly due to radiation treatment of breast cancer.  Pt. admitted twice in last few weeks and twice was readmitted after only a short period of time; pt. and husband expressed a desire to make sure she is ready for discharge when she eventually leaves this admission.  Pt. expressed that she has been receiving excellent care @ [facility]; pt.'s health history and current problems appear complicated; pt. shared she processes information given by the various physicians on her case in 'small pieces', coping with each bit of news she receives gradually.  Pt. mentioned doctors are unsure if breast cancer may have spread to lungs --'spots' appear on scans but then seem to change location on subsequent scans, pt. shared.  Pt. and husband appear to be coping effectively; appreciated [place] visit.  No further needs expressed at this time.    08/21/19 1400  Clinical Encounter Type  Visited With Patient and family together  Visit Type Initial;Social support;Psychological support;Critical Care  Referral From Other (Comment) (Routine Rounding)  Spiritual Encounters  Spiritual Needs Emotional  Stress Factors  Patient Stress Factors Health changes;Major life changes;Lack of knowledge  Family Stress Factors Major life changes;Health changes

## 2019-08-22 NOTE — Progress Notes (Signed)
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  In the emergency room she was tachycardic with EKG showing A. fib. BNP was 400. High-sensitivity troponin drawn per protocol was 98. Electrocardiogram showed sinus tachycardia with no ischemia.. Chest x-ray suggested possible pulmonary edema.   Subjective   Somewhat less short of breath.  Using bronchodilators with improvement.  Has diuresed 4 and half liters since admission.  BNP down to 291.  Inpatient Medications    . aspirin EC  81 mg Oral Daily  . budesonide  0.5 mg Nebulization BID  . Chlorhexidine Gluconate Cloth  6 each Topical Daily  . cholecalciferol  1,000 Units Oral QHS  . diltiazem  60 mg Oral Q6H  . enoxaparin (LOVENOX) injection  40 mg Subcutaneous Q24H  . ferrous sulfate  325 mg Oral QHS  . furosemide  20 mg Oral BID  . ipratropium  0.5 mg Nebulization Q6H  . ipratropium-albuterol  3 mL Nebulization Once  . levalbuterol  0.63 mg Nebulization Q6H  . methylPREDNISolone  4 mg Oral 3 x daily with food  . [START ON 08/23/2019] methylPREDNISolone  4 mg Oral 4X daily taper  . methylPREDNISolone  8 mg Oral Nightly  . metoprolol tartrate  25 mg Oral Once  . multivitamin with minerals  1 tablet Oral QHS  . omega-3 acid ethyl esters  1 g Oral Daily  . pantoprazole  20 mg Oral Daily  . polyethylene glycol  17 g Oral Daily  . sodium chloride flush  3 mL Intravenous Q12H  . sulfamethoxazole-trimethoprim  1 tablet Oral Once per day on Mon Wed Fri    Vital Signs    Vitals:   08/21/19 2356 08/22/19 0614 08/22/19 0644 08/22/19  0826  BP: 107/67 105/68  101/71  Pulse: 85 88  91  Resp: 18   17  Temp: 98.2 F (36.8 C)   98.6 F (37 C)  TempSrc:    Oral  SpO2: 95%   94%  Weight:   86.2 kg   Height:        Intake/Output Summary (Last 24 hours) at 08/22/2019 0854 Last data filed at 08/21/2019 1700 Gross per 24 hour  Intake 690 ml  Output 1300 ml  Net -610 ml   Filed Weights   08/20/19 0500 08/21/19 0424 08/22/19 0644  Weight: 92.5 kg 92.3 kg 86.2 kg    Physical Exam    GEN: Well nourished, well developed, in no acute distress.  HEENT: normal.  Neck: Supple, no JVD, carotid bruits, or masses. Cardiac: RRR, no murmurs, rubs, or gallops. No clubbing, cyanosis, edema.  Radials/DP/PT 2+ and equal bilaterally.  Respiratory:  Respirations regular and unlabored, clear to auscultation bilaterally. GI: Soft, nontender, nondistended, BS + x 4. MS: no deformity or atrophy. Skin: warm and dry, no rash. Neuro:  Strength and sensation are intact. Psych: Normal affect.  Labs    CBC Recent Labs    08/21/19 1311 08/22/19 0251  WBC 17.6* 10.3  NEUTROABS 15.2* 9.1*  HGB 13.0 11.9*  HCT 39.8 37.5  MCV 89.0 89.9  PLT 224 0000000   Basic Metabolic Panel Recent Labs  08/20/19 0624 08/22/19 0251  NA 138 135  K 3.2* 3.3*  CL 93* 91*  CO2 32 31  GLUCOSE 140* 135*  BUN 34* 33*  CREATININE 0.72 0.70  CALCIUM 9.7 9.8  MG 2.3 2.4  PHOS 3.9 3.1   Liver Function Tests Recent Labs    08/20/19 0624 08/22/19 0251  AST 21 22  ALT 25 21  ALKPHOS 80 80  BILITOT 1.0 0.8  PROT 6.0* 6.0*  ALBUMIN 3.3* 3.3*   No results for input(s): LIPASE, AMYLASE in the last 72 hours. Cardiac Enzymes No results for input(s): CKTOTAL, CKMB, CKMBINDEX, TROPONINI in the last 72 hours. BNP No results for input(s): BNP in the last 72 hours. D-Dimer No results for input(s): DDIMER in the last 72 hours. Hemoglobin A1C No results for input(s): HGBA1C in the last 72 hours. Fasting Lipid Panel No results for input(s): CHOL,  HDL, LDLCALC, TRIG, CHOLHDL, LDLDIRECT in the last 72 hours. Thyroid Function Tests No results for input(s): TSH, T4TOTAL, T3FREE, THYROIDAB in the last 72 hours.  Invalid input(s): FREET3  Telemetry    Normal sinus rhythm  ECG       Radiology    DG Chest 1 View  Result Date: 08/18/2019 CLINICAL DATA:  59 year old female with shortness of breath. EXAM: CHEST  1 VIEW COMPARISON:  Chest radiograph dated 08/14/2019 and CT dated 08/06/2019 FINDINGS: Right hilar post treatment changes. There is diffuse interstitial prominence and lower lung field hazy densities likely representing edema. Pneumonia is not excluded. Clinical correlation is recommended. There is no pneumothorax. There is a small right pleural effusion. Stable cardiac silhouette. Left pectoral Port-A-Cath with tip close to the cavoatrial junction. Atherosclerotic calcification of the aorta. No acute osseous pathology. IMPRESSION: Findings likely representing edema. Pneumonia is not excluded. Electronically Signed   By: Anner Crete M.D.   On: 08/18/2019 00:50   CT Angio Chest PE W and/or Wo Contrast  Result Date: 08/06/2019 CLINICAL DATA:  Hemoptysis, history of breast cancer and COVID EXAM: CT ANGIOGRAPHY CHEST WITH CONTRAST TECHNIQUE: Multidetector CT imaging of the chest was performed using the standard protocol during bolus administration of intravenous contrast. Multiplanar CT image reconstructions and MIPs were obtained to evaluate the vascular anatomy. CONTRAST:  61mL OMNIPAQUE IOHEXOL 350 MG/ML SOLN COMPARISON:  July 09, 2019 FINDINGS: Cardiovascular: There is a optimal opacification of the pulmonary arteries. There is no central,segmental, or subsegmental filling defects within the pulmonary arteries. A left-sided MediPort catheter is seen with the tip in the right atrium. The heart is normal in size. No pericardial effusion or thickening. No evidence right heart strain. There is normal three-vessel brachiocephalic  anatomy without proximal stenosis. Scattered mild aortic atherosclerosis. Mediastinum/Nodes: No hilar, mediastinal, or axillary adenopathy. Thyroid gland, trachea, and esophagus demonstrate no significant findings. Lungs/Pleura: There is new/worsening multifocal patchy predominantly peripherally based ground-glass opacities seen throughout both lungs. There is also slight interval worsening in the tree-in-bud nodular hazy opacities within the left lung apex. There is unchanged spiculated masslike consolidation within the right infrahilar region, likely from post treatment changes/fibrosis. There is a trace right pleural effusion present. Upper Abdomen: No acute abnormalities present in the visualized portions of the upper abdomen. Musculoskeletal: No chest wall abnormality. No acute or significant osseous findings. Again noted is diffuse skin thickening seen along the right breast. There is a surgical clips within the right breast. Review of the MIP images confirms the above findings. IMPRESSION: 1. No central, segmental, or subsegmental pulmonary embolism. 2. Interval worsening of the  multifocal patchy ground-glass opacities throughout both lungs, consistent with multifocal pneumonia. 3. Interval slight worsening in the tree-in-bud nodular opacities within the left upper lobe, likely due to atypical infectious etiology, however cannot exclude underlying metastatic disease. 4. Post treatment radiation/fibrotic changes in the right infrahilar region. 5. Trace right pleural effusion Electronically Signed   By: Prudencio Pair M.D.   On: 08/06/2019 00:17   CT Chest High Resolution  Result Date: 08/21/2019 CLINICAL DATA:  Hypoxemia. COVID-19 in December. Breast cancer with lung metastasis status post chemotherapy and radiotherapy. EXAM: CT CHEST WITHOUT CONTRAST TECHNIQUE: Multidetector CT imaging of the chest was performed following the standard protocol without intravenous contrast. High resolution imaging of the  lungs, as well as inspiratory and expiratory imaging, was performed. COMPARISON:  08/18/2019 chest radiograph. 08/06/2019 chest CT angiogram. FINDINGS: Cardiovascular: Normal heart size. Trace pericardial effusion/thickening is stable. Left subclavian Port-A-Cath terminates at the cavoatrial junction. Mildly atherosclerotic nonaneurysmal thoracic aorta. Dilated main pulmonary artery (3.5 cm diameter). Mediastinum/Nodes: No discrete thyroid nodules. Unremarkable esophagus. Surgical clips again noted in the right axilla. No pathologically enlarged axillary lymph nodes. No discrete mediastinal adenopathy. Lungs/Pleura: No pneumothorax. Small dependent right and trace dependent left pleural effusions are unchanged. There is extensive patchy irregular thickening of the peribronchovascular interstitium and interlobular septa throughout both lungs, most prominent in the left lower lobe, which appears to be progressively worsening on multiple chest CT studies back to 04/04/2019. Central perihilar prominent interstitial thickening also appears progressively increased back to 04/04/2019 CT. Patchy ground-glass opacities throughout both lungs have worsened. Similarly, there is extensive patchy sub solid pulmonary nodularity throughout both lungs with progressive enlargement back to 07/09/2019 chest CT. Representative basilar right lower lobe 1.8 cm nodule (series 3/image 127), previously 0.6 cm on 07/09/2019 and 1.4 cm on 08/06/2019 CT. Representative right upper lobe 1.1 cm nodule (series 3/image 41), new. Medial left lower lobe 1.7 cm nodule (series 3/image 113), previously 0.9 cm on 07/09/2019 and 1.1 cm on 08/06/2019. No significant regions of bronchiectasis. No honeycombing. Moderate patchy air trapping in both lungs on the expiration sequence. Upper abdomen: No acute abnormality. Musculoskeletal: No aggressive appearing focal osseous lesions. Mild thoracic spondylosis. IMPRESSION: 1. Extensive patchy irregular  peribronchovascular and interlobular septal thickening, patchy ground-glass opacities, subsolid nodularity and irregular perihilar consolidation throughout both lungs, all progressively worsening on multiple chest CT studies back to 04/04/2019. 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 2. Stable small dependent right and trace dependent left pleural effusions. 3. Moderate patchy air trapping in both lungs, indicative of small airways disease. 4. Dilated main pulmonary artery, suggesting pulmonary arterial hypertension. 5. Aortic Atherosclerosis (ICD10-I70.0). Electronically Signed   By: Ilona Sorrel M.D.   On: 08/21/2019 12:47   DG Chest Port 1 View  Result Date: 08/14/2019 CLINICAL DATA:  Acute respiratory failure. EXAM: PORTABLE CHEST 1 VIEW COMPARISON:  Chest x-ray 08/13/2019 FINDINGS: The left subclavian power port is stable. Stable radiation changes involving the right hilum and right paramediastinal lung. Persistent patchy nodular infiltrates and small right effusion. No pneumothorax. IMPRESSION: Stable chest x-ray. Persistent patchy nodular infiltrates and small right effusion. Electronically Signed   By: Marijo Sanes M.D.   On: 08/14/2019 06:19   DG Chest Port 1 View  Result Date: 08/13/2019 CLINICAL DATA:  Shortness of breath, tachypnea with decreased oxygen saturations., history of breast cancer. EXAM: PORTABLE CHEST 1 VIEW COMPARISON:  08/06/2019, CT of the chest. FINDINGS: Masslike appearance of  right hilum similar to previous exam. Cardiomediastinal contours are stable. Left Port-A-Cath terminates at the caval to atrial junction. Nodular opacities are again suggested and there is increase in interstitial prominence when compared to the chest x-ray from 08/05/2019. Blunting of right costophrenic angles suggest scarring or small effusion. No signs of dense consolidation aside from  perihilar changes discussed above. Visualized skeletal structures without acute bone finding. IMPRESSION: Masslike appearance of right hilum similar to previous exam. Nodular opacities are again suggested and there is increase in interstitial prominence when compared to the chest x-ray from 08/05/2019. Findings may represent worsening of pulmonary parenchymal disease, associated with previously reported COVID-19 infection, particularly in the left lung base, potentially with a background of pulmonary edema. Electronically Signed   By: Zetta Bills M.D.   On: 08/13/2019 15:26   DG Chest Portable 1 View  Result Date: 08/05/2019 CLINICAL DATA:  59 year old female with hemoptysis. History of breast cancer. EXAM: PORTABLE CHEST 1 VIEW COMPARISON:  Chest radiograph dated 04/09/2017. CT dated 07/09/2019. FINDINGS: Port-A-Cath with tip in the region of the cavoatrial junction. Right hilar density corresponding to the post treatment/post radiation fibrosis seen on the prior CT. Diffuse interstitial and vascular prominence with scattered faint reticulonodular densities throughout the lungs. Probable small bilateral pleural effusions. No pneumothorax. The cardiac silhouette is within normal limits. Atherosclerotic calcification of the aorta. No acute osseous pathology. IMPRESSION: Right hilar post radiation fibrosis and scattered bilateral reticulonodular densities and small bilateral pleural effusions. Overall findings are similar to the CT of 07/09/2019. Electronically Signed   By: Anner Crete M.D.   On: 08/05/2019 22:59   ECHOCARDIOGRAM COMPLETE  Result Date: 08/14/2019    ECHOCARDIOGRAM REPORT   Patient Name:   Ashley Molina Date of Exam: 08/14/2019 Medical Rec #:  [id number]          Height:       63.0 in Accession #:    [id number]         Weight:       198.0 lb Date of Birth:  15-Jul-1960         BSA:          1.925 m Patient Age:    53 years           BP:           105/71 mmHg Patient Gender: F                   HR:           95 bpm. Exam Location:  [facility] Procedure: 2D Echo, Cardiac Doppler and Color Doppler Indications:     Dyspnea 786.09  History:         Patient has prior history of Echocardiogram examinations, most                  recent 04/22/2019. Breast cancer, covid-19, history of                  chemotherapy.  Sonographer:     [name] RDCS (AE) Referring Phys:  [id number] [facility] AMIN Diagnosing Phys: Serafina Royals MD  Sonographer Comments: Suboptimal apical window. IMPRESSIONS  1. Left ventricular ejection fraction, by estimation, is 55 to 60%. The left ventricle has normal function. The left ventricle has no regional wall motion abnormalities. Left ventricular diastolic parameters were normal.  2. Right ventricular systolic function is normal. The right ventricular size is normal. There is normal pulmonary artery systolic pressure.  3.  The mitral valve is normal in structure. Trivial mitral valve regurgitation.  4. The aortic valve is normal in structure. Aortic valve regurgitation is not visualized. FINDINGS  Left Ventricle: Left ventricular ejection fraction, by estimation, is 55 to 60%. The left ventricle has normal function. The left ventricle has no regional wall motion abnormalities. The left ventricular internal cavity size was normal in size. There is  no left ventricular hypertrophy. Left ventricular diastolic parameters were normal. Right Ventricle: The right ventricular size is normal. No increase in right ventricular wall thickness. Right ventricular systolic function is normal. There is normal pulmonary artery systolic pressure. The tricuspid regurgitant velocity is 1.87 m/s, and  with an assumed right atrial pressure of 10 mmHg, the estimated right ventricular systolic pressure is 0000000 mmHg. Left Atrium: Left atrial size was normal in size. Right Atrium: Right atrial size was normal in size. Pericardium: There is no evidence of pericardial effusion. Mitral Valve: The mitral valve is normal  in structure. Trivial mitral valve regurgitation. Tricuspid Valve: The tricuspid valve is normal in structure. Tricuspid valve regurgitation is trivial. Aortic Valve: The aortic valve is normal in structure. Aortic valve regurgitation is not visualized. Aortic valve mean gradient measures 2.0 mmHg. Aortic valve peak gradient measures 3.3 mmHg. Aortic valve area, by VTI measures 3.78 cm. Pulmonic Valve: The pulmonic valve was normal in structure. Pulmonic valve regurgitation is not visualized. Aorta: The aortic root and ascending aorta are structurally normal, with no evidence of dilitation. IAS/Shunts: No atrial level shunt detected by color flow Doppler.  LEFT VENTRICLE PLAX 2D LVIDd:         3.86 cm  Diastology LVIDs:         2.43 cm  LV e' lateral:   9.79 cm/s LV PW:         0.82 cm  LV E/e' lateral: 6.0 LV IVS:        0.82 cm  LV e' medial:    6.96 cm/s LVOT diam:     2.00 cm  LV E/e' medial:  8.4 LV SV:         44 LV SV Index:   23 LVOT Area:     3.14 cm  RIGHT VENTRICLE RV Basal diam:  3.94 cm TAPSE (M-mode): 3.5 cm LEFT ATRIUM           Index       RIGHT ATRIUM           Index LA diam:      2.00 cm 1.04 cm/m  RA Area:     19.40 cm LA Vol (A2C): 24.8 ml 12.88 ml/m RA Volume:   58.80 ml  30.54 ml/m LA Vol (A4C): 24.8 ml 12.88 ml/m  AORTIC VALVE                   PULMONIC VALVE AV Area (Vmax):    3.11 cm    PV Vmax:        0.48 m/s AV Area (Vmean):   3.48 cm    PV Peak grad:   0.9 mmHg AV Area (VTI):     3.78 cm    RVOT Peak grad: 1 mmHg AV Vmax:           90.35 cm/s AV Vmean:          62.550 cm/s AV VTI:            0.115 m AV Peak Grad:      3.3 mmHg AV Mean Grad:  2.0 mmHg LVOT Vmax:         89.40 cm/s LVOT Vmean:        69.200 cm/s LVOT VTI:          0.139 m LVOT/AV VTI ratio: 1.20  AORTA Ao Root diam: 2.50 cm MITRAL VALVE               TRICUSPID VALVE MV Area (PHT): 8.62 cm    TR Peak grad:   14.0 mmHg MV Decel Time: 88 msec     TR Vmax:        187.00 cm/s MV E velocity: 58.70 cm/s MV A  velocity: 86.80 cm/s  SHUNTS MV E/A ratio:  0.68        Systemic VTI:  0.14 m                            Systemic Diam: 2.00 cm Serafina Royals MD Electronically signed by Serafina Royals MD Signature Date/Time: 08/14/2019/1:54:24 PM    Final     Assessment & Plan    59 y.o.femalewith history ofbreast carcinoma with lung metastasis status post lumpectomy, chemotherapy and radiation therapy, history of COVID-19 in December 2020, history of dyslipidemia, anemia admitted with increasing shortness of breath and fatigue. She was recently discharged from [facility] on March 10 with acute on chronic respiratory failure requiring BiPAP. She felt worse after discharge and return. In the emergency room she was tachycardic with EKG showing A. fib. BNP was 400. High-sensitivity troponin drawn per protocol was 98. Electrocardiogram showed sinus tachycardia with no ischemia.. Chest x-ray suggested possible pulmonary edema. Pneumonia was not excluded. She had an echocardiogram which was read as showing normal LV function EF 55 to 60% with no regional wall motion abnormalities. Right ventricular function was normal. There was trivial MR and TR. Comparison of echo done in 2019 and 11 of 2020 showed no change. BNP on admission was 400.  1. Tachycardia-appears to be sinus tachycardia. Rate is improved control currently with diltiazem 60 every 6.  Will convert to p.o. long-acting Cardizem CD at 240 mg daily. Some of this may be situational given her pulmonary status. Will need to carefully follow her blood pressure as her pressure is somewhat soft.  2. Respiratory distress-likely secondary to metastatic disease as well as reactive airway disease. Would continue with bronchodilators as you are doing. Will attempt to control heart rate. Echo shows preserved LV function.  3. Elevated troponin-appears to be demand. Does not appear to represent acute ischemic syndrome as she is asymptomatic from this and has  no ischemia on electrocardiogram  4. Congestive heart failure-chest x-ray suggest possible mild pulmonary edema. BNP is mildly elevated at 291. We will continue with careful diuresis following renal function and hemodynamics. Not a candidate for afterload reduction given for HFpEF and soft blood pressure.  Will add K to her due to potassium levels.  Signed, Javier Docker Fath MD 08/22/2019, 8:54 AM  Pager: [phone]

## 2019-08-22 NOTE — Progress Notes (Signed)
Hematology/Oncology Consult note [facility]  Telephone:([phone] [phone]  Patient Care Team: Patient, No Pcp Per as PCP - General (General Practice) Byrnett, Forest Gleason, MD (General Surgery) Gae Dry, MD as Referring Physician (Obstetrics and Gynecology)   Name of the patient: Ashley Molina  [id number]  May 29, 1961   Date of visit: 08/22/2019   Interval history-yesterday was a good day for her in terms of breathing but today again she noticed difficulty breathing at rest and required morphine Ativan as well as increasing her steroid dose to help her breathe better.  ECOG PS- 3 Pain scale- 0   Review of systems- Review of Systems  Constitutional: Positive for malaise/fatigue. Negative for chills, fever and weight loss.  HENT: Negative for congestion, ear discharge and nosebleeds.   Eyes: Negative for blurred vision.  Respiratory: Positive for shortness of breath. Negative for cough, hemoptysis, sputum production and wheezing.   Cardiovascular: Negative for chest pain, palpitations, orthopnea and claudication.  Gastrointestinal: Negative for abdominal pain, blood in stool, constipation, diarrhea, heartburn, melena, nausea and vomiting.  Genitourinary: Negative for dysuria, flank pain, frequency, hematuria and urgency.  Musculoskeletal: Negative for back pain, joint pain and myalgias.  Skin: Negative for rash.  Neurological: Negative for dizziness, tingling, focal weakness, seizures, weakness and headaches.  Endo/Heme/Allergies: Does not bruise/bleed easily.  Psychiatric/Behavioral: Negative for depression and suicidal ideas. The patient does not have insomnia.      Allergies  Allergen Reactions   Penicillins Rash    Did it involve swelling of the face/tongue/throat, SOB, or low BP? No Did it involve sudden or severe rash/hives, skin peeling, or any reaction on the inside of your mouth or nose? Yes Did you need to seek  medical attention at a hospital or doctor's office? No When did it last happen? If all above answers are NO, may proceed with cephalosporin use.     Past Medical History:  Diagnosis Date   Anemia    Breast cancer ([place])    Breast cancer, right ([place]) 04/2017   Hx Lumpectomy, Chemo + Rad tx's.   Breast cyst, right    aspirated by Dr. Bary Castilla   COVID-19    Hyperlipidemia    Personal history of chemotherapy    Pre-diabetes      Past Surgical History:  Procedure Laterality Date   AXILLARY LYMPH NODE BIOPSY Right 04/09/2017   Procedure: AXILLARY LYMPH NODE BIOPSY;  Surgeon: Robert Bellow, MD;  Location: [facility] ORS;  Service: General;  Laterality: Right;   BREAST BIOPSY Right 03/27/2017   US guided breast mass - invasive mammary carcinoma   BREAST BIOPSY Right 03/27/2017   Lymph node - metastatic carcinoma   BREAST BIOPSY Right 04/09/2017   Lymph node    BREAST CYST ASPIRATION Right 11/2009   Dr. Bary Castilla did FNA   BREAST EXCISIONAL BIOPSY Right 09/28/2017   lumpectomy and 12 lympnode rad neo adj chemo   COLONOSCOPY  2011   DILATION AND CURETTAGE OF UTERUS     X3   ENDOBRONCHIAL ULTRASOUND N/A 04/09/2017   Procedure: ENDOBRONCHIAL ULTRASOUND;  Surgeon: Laverle Hobby, MD;  Location: [facility] ORS;  Service: Pulmonary;  Laterality: N/A;   ENDOMETRIAL ABLATION     ENDOMETRIAL BIOPSY  09/2009   PORTACATH PLACEMENT Left 04/09/2017   Procedure: INSERTION PORT-A-CATH;  Surgeon: Robert Bellow, MD;  Location: [facility] ORS;  Service: General;  Laterality: Left;    Social History   Socioeconomic History   Marital status: Married  Spouse name: Not on file   Number of children: Not on file   Years of education: Not on file   Highest education level: Not on file  Occupational History   Not on file  Tobacco Use   Smoking status: Never Smoker   Smokeless tobacco: Never Used  Substance and Sexual Activity   Alcohol use: Not Currently    Drug use: No   Sexual activity: Not Currently  Other Topics Concern   Not on file  Social History Narrative   Not on file   Social Determinants of Health   Financial Resource Strain:    Difficulty of Paying Living Expenses:   Food Insecurity:    Worried About Charity fundraiser in the Last Year:    Arboriculturist in the Last Year:   Transportation Needs:    Film/video editor (Medical):    Lack of Transportation (Non-Medical):   Physical Activity:    Days of Exercise per Week:    Minutes of Exercise per Session:   Stress:    Feeling of Stress :   Social Connections:    Frequency of Communication with Friends and Family:    Frequency of Social Gatherings with Friends and Family:    Attends Religious Services:    Active Member of Clubs or Organizations:    Attends Music therapist:    Marital Status:   Intimate Partner Violence:    Fear of Current or Ex-Partner:    Emotionally Abused:    Physically Abused:    Sexually Abused:     Family History  Problem Relation Age of Onset   Melanoma Maternal Grandmother 98       currently 40   Brain cancer Maternal Grandfather 50       unk. type; deceased in 5s   Melanoma Other 72       mat grandmother's father   Melanoma Father 39       on head; currently 2   Prostate cancer Maternal Uncle        3 maternal uncles; dx in 50s   Breast cancer Other        mat grandfather's sister; dx 89s     Current Facility-Administered Medications:    0.9 %  sodium chloride infusion, 250 mL, Intravenous, PRN, Mansy, Jan A, MD, Last Rate: 20 mL/hr at 08/22/19 0655, 250 mL at 08/22/19 0655   acetaminophen (TYLENOL) tablet 650 mg, 650 mg, Oral, Q4H PRN, Mansy, Jan A, MD   aspirin EC tablet 81 mg, 81 mg, Oral, Daily, Mansy, Jan A, MD, 81 mg at 08/22/19 [id number]   azithromycin (ZITHROMAX) 500 mg in sodium chloride 0.9 % 250 mL IVPB, 500 mg, Intravenous, Q24H, Mansy, Jan A, MD, Last Rate: 250 mL/hr  at 08/22/19 0934, 500 mg at 08/22/19 0934   budesonide (PULMICORT) nebulizer solution 0.5 mg, 0.5 mg, Nebulization, BID, Mansy, Jan A, MD, 0.5 mg at 08/22/19 [id number]   cefTRIAXone (ROCEPHIN) 2 g in sodium chloride 0.9 % 100 mL IVPB, 2 g, Intravenous, Q24H, Mansy, Jan A, MD, Last Rate: 200 mL/hr at 08/22/19 0658, 2 g at 08/22/19 [id number]   Chlorhexidine Gluconate Cloth 2 % PADS 6 each, 6 each, Topical, Daily, Elodia Florence., MD, 6 each at 08/18/19 1401   cholecalciferol (VITAMIN D3) tablet 1,000 Units, 1,000 Units, Oral, QHS, Mansy, Jan A, MD, 1,000 Units at 08/21/19 2154   diltiazem (CARDIZEM CD) 24 hr capsule 240 mg, 240 mg, Oral, Daily, Fath,  Javier Docker, MD, 240 mg at 08/22/19 0928   enoxaparin (LOVENOX) injection 40 mg, 40 mg, Subcutaneous, Q24H, Mansy, Jan A, MD, 40 mg at 08/22/19 [id number]   ferrous sulfate tablet 325 mg, 325 mg, Oral, QHS, Mansy, Jan A, MD, 325 mg at 08/21/19 2154   furosemide (LASIX) tablet 20 mg, 20 mg, Oral, BID, Florina Ou V, MD, 20 mg at 08/22/19 [id number]   guaiFENesin-dextromethorphan (ROBITUSSIN DM) 100-10 MG/5ML syrup 15 mL, 15 mL, Oral, Q4H PRN, Mansy, Jan A, MD   ipratropium (ATROVENT) nebulizer solution 0.5 mg, 0.5 mg, Nebulization, Q6H, Elodia Florence., MD, 0.5 mg at 08/22/19 0739   ipratropium-albuterol (DUONEB) 0.5-2.5 (3) MG/3ML nebulizer solution 3 mL, 3 mL, Nebulization, Once, Blake Divine, MD   levalbuterol [facility]) nebulizer solution 0.63 mg, 0.63 mg, Nebulization, Q6H, Elodia Florence., MD, 0.63 mg at 08/22/19 0738   LORazepam (ATIVAN) tablet 0.5 mg, 0.5 mg, Oral, Q6H PRN, Borders, Vonna Kotyk R, NP, 0.5 mg at 08/19/19 1132   methylPREDNISolone sodium succinate (SOLU-MEDROL) 125 mg/2 mL injection 60 mg, 60 mg, Intravenous, Q4H, Florina Ou V, MD, 60 mg at 08/22/19 1120   morphine 2 MG/ML injection 1-2 mg, 1-2 mg, Intravenous, Q2H PRN, Borders, Vonna Kotyk R, NP, 1 mg at 08/22/19 1039   multivitamin with minerals tablet 1 tablet, 1 tablet,  Oral, QHS, Mansy, Jan A, MD, 1 tablet at 08/21/19 2155   omega-3 acid ethyl esters (LOVAZA) capsule 1 g, 1 g, Oral, Daily, Mansy, Jan A, MD, 1 g at 08/22/19 0928   ondansetron (ZOFRAN) injection 4 mg, 4 mg, Intravenous, Q6H PRN, Mansy, Jan A, MD   pantoprazole (PROTONIX) EC tablet 20 mg, 20 mg, Oral, Daily, Mansy, Jan A, MD, 20 mg at 08/22/19 0929   polyethylene glycol (MIRALAX / GLYCOLAX) packet 17 g, 17 g, Oral, Daily, Ouma, Bing Neighbors, NP, 17 g at 08/22/19 0616   potassium chloride SA (KLOR-CON) CR tablet 20 mEq, 20 mEq, Oral, Daily, Teodoro Spray, MD, 20 mEq at 08/22/19 [id number]   sodium chloride flush (NS) 0.9 % injection 3 mL, 3 mL, Intravenous, Q12H, Mansy, Jan A, MD, 3 mL at 08/22/19 0930   sodium chloride flush (NS) 0.9 % injection 3 mL, 3 mL, Intravenous, PRN, Mansy, Jan A, MD   sulfamethoxazole-trimethoprim (BACTRIM DS) 800-160 MG per tablet 1 tablet, 1 tablet, Oral, Once per day on Mon Wed Fri, Mansy, Jan A, MD, 1 tablet at 08/22/19 1120  Physical exam:  Vitals:   08/22/19 0614 08/22/19 0644 08/22/19 0826 08/22/19 1039  BP: 105/68  101/71 131/81  Pulse: 88  91 (!) 105  Resp:   17 20  Temp:   98.6 F (37 C) 98.2 F (36.8 C)  TempSrc:   Oral Oral  SpO2:   94% 93%  Weight:  190 lb 0.6 oz (86.2 kg)    Height:       Physical Exam Cardiovascular:     Rate and Rhythm: Regular rhythm. Tachycardia present.     Heart sounds: Normal heart sounds.  Pulmonary:     Comments: Effort increased Skin:    General: Skin is warm and dry.  Neurological:     Mental Status: She is alert and oriented to person, place, and time.      CMP Latest Ref Rng & Units 08/22/2019  Glucose 70 - 99 mg/dL 135(H)  BUN 6 - 20 mg/dL 33(H)  Creatinine 0.44 - 1.00 mg/dL 0.70  Sodium 135 - 145 mmol/L 135  Potassium 3.5 - 5.1  mmol/L 3.3(L)  Chloride 98 - 111 mmol/L 91(L)  CO2 22 - 32 mmol/L 31  Calcium 8.9 - 10.3 mg/dL 9.8  Total Protein 6.5 - 8.1 g/dL 6.0(L)  Total Bilirubin 0.3 - 1.2  mg/dL 0.8  Alkaline Phos 38 - 126 U/L 80  AST 15 - 41 U/L 22  ALT 0 - 44 U/L 21   CBC Latest Ref Rng & Units 08/22/2019  WBC 4.0 - 10.5 K/uL 10.3  Hemoglobin 12.0 - 15.0 g/dL 11.9(L)  Hematocrit 36.0 - 46.0 % 37.5  Platelets 150 - 400 K/uL 204    @IMAGES @  DG Chest 1 View  Result Date: 08/18/2019 CLINICAL DATA:  59 year old female with shortness of breath. EXAM: CHEST  1 VIEW COMPARISON:  Chest radiograph dated 08/14/2019 and CT dated 08/06/2019 FINDINGS: Right hilar post treatment changes. There is diffuse interstitial prominence and lower lung field hazy densities likely representing edema. Pneumonia is not excluded. Clinical correlation is recommended. There is no pneumothorax. There is a small right pleural effusion. Stable cardiac silhouette. Left pectoral Port-A-Cath with tip close to the cavoatrial junction. Atherosclerotic calcification of the aorta. No acute osseous pathology. IMPRESSION: Findings likely representing edema. Pneumonia is not excluded. Electronically Signed   By: Anner Crete M.D.   On: 08/18/2019 00:50   CT Angio Chest PE W and/or Wo Contrast  Result Date: 08/06/2019 CLINICAL DATA:  Hemoptysis, history of breast cancer and COVID EXAM: CT ANGIOGRAPHY CHEST WITH CONTRAST TECHNIQUE: Multidetector CT imaging of the chest was performed using the standard protocol during bolus administration of intravenous contrast. Multiplanar CT image reconstructions and MIPs were obtained to evaluate the vascular anatomy. CONTRAST:  22mL OMNIPAQUE IOHEXOL 350 MG/ML SOLN COMPARISON:  July 09, 2019 FINDINGS: Cardiovascular: There is a optimal opacification of the pulmonary arteries. There is no central,segmental, or subsegmental filling defects within the pulmonary arteries. A left-sided MediPort catheter is seen with the tip in the right atrium. The heart is normal in size. No pericardial effusion or thickening. No evidence right heart strain. There is normal three-vessel brachiocephalic  anatomy without proximal stenosis. Scattered mild aortic atherosclerosis. Mediastinum/Nodes: No hilar, mediastinal, or axillary adenopathy. Thyroid gland, trachea, and esophagus demonstrate no significant findings. Lungs/Pleura: There is new/worsening multifocal patchy predominantly peripherally based ground-glass opacities seen throughout both lungs. There is also slight interval worsening in the tree-in-bud nodular hazy opacities within the left lung apex. There is unchanged spiculated masslike consolidation within the right infrahilar region, likely from post treatment changes/fibrosis. There is a trace right pleural effusion present. Upper Abdomen: No acute abnormalities present in the visualized portions of the upper abdomen. Musculoskeletal: No chest wall abnormality. No acute or significant osseous findings. Again noted is diffuse skin thickening seen along the right breast. There is a surgical clips within the right breast. Review of the MIP images confirms the above findings. IMPRESSION: 1. No central, segmental, or subsegmental pulmonary embolism. 2. Interval worsening of the multifocal patchy ground-glass opacities throughout both lungs, consistent with multifocal pneumonia. 3. Interval slight worsening in the tree-in-bud nodular opacities within the left upper lobe, likely due to atypical infectious etiology, however cannot exclude underlying metastatic disease. 4. Post treatment radiation/fibrotic changes in the right infrahilar region. 5. Trace right pleural effusion Electronically Signed   By: Prudencio Pair M.D.   On: 08/06/2019 00:17   CT Chest High Resolution  Result Date: 08/21/2019 CLINICAL DATA:  Hypoxemia. COVID-19 in December. Breast cancer with lung metastasis status post chemotherapy and radiotherapy. EXAM: CT CHEST WITHOUT CONTRAST TECHNIQUE:  Multidetector CT imaging of the chest was performed following the standard protocol without intravenous contrast. High resolution imaging of the  lungs, as well as inspiratory and expiratory imaging, was performed. COMPARISON:  08/18/2019 chest radiograph. 08/06/2019 chest CT angiogram. FINDINGS: Cardiovascular: Normal heart size. Trace pericardial effusion/thickening is stable. Left subclavian Port-A-Cath terminates at the cavoatrial junction. Mildly atherosclerotic nonaneurysmal thoracic aorta. Dilated main pulmonary artery (3.5 cm diameter). Mediastinum/Nodes: No discrete thyroid nodules. Unremarkable esophagus. Surgical clips again noted in the right axilla. No pathologically enlarged axillary lymph nodes. No discrete mediastinal adenopathy. Lungs/Pleura: No pneumothorax. Small dependent right and trace dependent left pleural effusions are unchanged. There is extensive patchy irregular thickening of the peribronchovascular interstitium and interlobular septa throughout both lungs, most prominent in the left lower lobe, which appears to be progressively worsening on multiple chest CT studies back to 04/04/2019. Central perihilar prominent interstitial thickening also appears progressively increased back to 04/04/2019 CT. Patchy ground-glass opacities throughout both lungs have worsened. Similarly, there is extensive patchy sub solid pulmonary nodularity throughout both lungs with progressive enlargement back to 07/09/2019 chest CT. Representative basilar right lower lobe 1.8 cm nodule (series 3/image 127), previously 0.6 cm on 07/09/2019 and 1.4 cm on 08/06/2019 CT. Representative right upper lobe 1.1 cm nodule (series 3/image 41), new. Medial left lower lobe 1.7 cm nodule (series 3/image 113), previously 0.9 cm on 07/09/2019 and 1.1 cm on 08/06/2019. No significant regions of bronchiectasis. No honeycombing. Moderate patchy air trapping in both lungs on the expiration sequence. Upper abdomen: No acute abnormality. Musculoskeletal: No aggressive appearing focal osseous lesions. Mild thoracic spondylosis. IMPRESSION: 1. Extensive patchy irregular  peribronchovascular and interlobular septal thickening, patchy ground-glass opacities, subsolid nodularity and irregular perihilar consolidation throughout both lungs, all progressively worsening on multiple chest CT studies back to 04/04/2019. 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 2. Stable small dependent right and trace dependent left pleural effusions. 3. Moderate patchy air trapping in both lungs, indicative of small airways disease. 4. Dilated main pulmonary artery, suggesting pulmonary arterial hypertension. 5. Aortic Atherosclerosis (ICD10-I70.0). Electronically Signed   By: Ilona Sorrel M.D.   On: 08/21/2019 12:47   DG Chest Port 1 View  Result Date: 08/14/2019 CLINICAL DATA:  Acute respiratory failure. EXAM: PORTABLE CHEST 1 VIEW COMPARISON:  Chest x-ray 08/13/2019 FINDINGS: The left subclavian power port is stable. Stable radiation changes involving the right hilum and right paramediastinal lung. Persistent patchy nodular infiltrates and small right effusion. No pneumothorax. IMPRESSION: Stable chest x-ray. Persistent patchy nodular infiltrates and small right effusion. Electronically Signed   By: Marijo Sanes M.D.   On: 08/14/2019 06:19   DG Chest Port 1 View  Result Date: 08/13/2019 CLINICAL DATA:  Shortness of breath, tachypnea with decreased oxygen saturations., history of breast cancer. EXAM: PORTABLE CHEST 1 VIEW COMPARISON:  08/06/2019, CT of the chest. FINDINGS: Masslike appearance of right hilum similar to previous exam. Cardiomediastinal contours are stable. Left Port-A-Cath terminates at the caval to atrial junction. Nodular opacities are again suggested and there is increase in interstitial prominence when compared to the chest x-ray from 08/05/2019. Blunting of right costophrenic angles suggest scarring or small effusion. No signs of dense consolidation aside from  perihilar changes discussed above. Visualized skeletal structures without acute bone finding. IMPRESSION: Masslike appearance of right hilum similar to previous exam. Nodular opacities are again suggested and there is increase in interstitial prominence when compared to the chest x-ray from 08/05/2019. Findings  may represent worsening of pulmonary parenchymal disease, associated with previously reported COVID-19 infection, particularly in the left lung base, potentially with a background of pulmonary edema. Electronically Signed   By: Zetta Bills M.D.   On: 08/13/2019 15:26   DG Chest Portable 1 View  Result Date: 08/05/2019 CLINICAL DATA:  59 year old female with hemoptysis. History of breast cancer. EXAM: PORTABLE CHEST 1 VIEW COMPARISON:  Chest radiograph dated 04/09/2017. CT dated 07/09/2019. FINDINGS: Port-A-Cath with tip in the region of the cavoatrial junction. Right hilar density corresponding to the post treatment/post radiation fibrosis seen on the prior CT. Diffuse interstitial and vascular prominence with scattered faint reticulonodular densities throughout the lungs. Probable small bilateral pleural effusions. No pneumothorax. The cardiac silhouette is within normal limits. Atherosclerotic calcification of the aorta. No acute osseous pathology. IMPRESSION: Right hilar post radiation fibrosis and scattered bilateral reticulonodular densities and small bilateral pleural effusions. Overall findings are similar to the CT of 07/09/2019. Electronically Signed   By: Anner Crete M.D.   On: 08/05/2019 22:59   ECHOCARDIOGRAM COMPLETE  Result Date: 08/14/2019    ECHOCARDIOGRAM REPORT   Patient Name:   AUDRYANNA MAZIQUE Date of Exam: 08/14/2019 Medical Rec #:  [id number]          Height:       63.0 in Accession #:    [id number]         Weight:       198.0 lb Date of Birth:  May 08, 1961         BSA:          1.925 m Patient Age:    31 years           BP:           105/71 mmHg Patient Gender: F                   HR:           95 bpm. Exam Location:  [facility] Procedure: 2D Echo, Cardiac Doppler and Color Doppler Indications:     Dyspnea 786.09  History:         Patient has prior history of Echocardiogram examinations, most                  recent 04/22/2019. Breast cancer, covid-19, history of                  chemotherapy.  Sonographer:     [name] RDCS (AE) Referring Phys:  [id number] [facility] AMIN Diagnosing Phys: Serafina Royals MD  Sonographer Comments: Suboptimal apical window. IMPRESSIONS  1. Left ventricular ejection fraction, by estimation, is 55 to 60%. The left ventricle has normal function. The left ventricle has no regional wall motion abnormalities. Left ventricular diastolic parameters were normal.  2. Right ventricular systolic function is normal. The right ventricular size is normal. There is normal pulmonary artery systolic pressure.  3. The mitral valve is normal in structure. Trivial mitral valve regurgitation.  4. The aortic valve is normal in structure. Aortic valve regurgitation is not visualized. FINDINGS  Left Ventricle: Left ventricular ejection fraction, by estimation, is 55 to 60%. The left ventricle has normal function. The left ventricle has no regional wall motion abnormalities. The left ventricular internal cavity size was normal in size. There is  no left ventricular hypertrophy. Left ventricular diastolic parameters were normal. Right Ventricle: The right ventricular size is normal. No increase in right ventricular wall thickness. Right ventricular systolic function is normal.  There is normal pulmonary artery systolic pressure. The tricuspid regurgitant velocity is 1.87 m/s, and  with an assumed right atrial pressure of 10 mmHg, the estimated right ventricular systolic pressure is 0000000 mmHg. Left Atrium: Left atrial size was normal in size. Right Atrium: Right atrial size was normal in size. Pericardium: There is no evidence of pericardial effusion. Mitral Valve: The mitral valve is normal  in structure. Trivial mitral valve regurgitation. Tricuspid Valve: The tricuspid valve is normal in structure. Tricuspid valve regurgitation is trivial. Aortic Valve: The aortic valve is normal in structure. Aortic valve regurgitation is not visualized. Aortic valve mean gradient measures 2.0 mmHg. Aortic valve peak gradient measures 3.3 mmHg. Aortic valve area, by VTI measures 3.78 cm. Pulmonic Valve: The pulmonic valve was normal in structure. Pulmonic valve regurgitation is not visualized. Aorta: The aortic root and ascending aorta are structurally normal, with no evidence of dilitation. IAS/Shunts: No atrial level shunt detected by color flow Doppler.  LEFT VENTRICLE PLAX 2D LVIDd:         3.86 cm  Diastology LVIDs:         2.43 cm  LV e' lateral:   9.79 cm/s LV PW:         0.82 cm  LV E/e' lateral: 6.0 LV IVS:        0.82 cm  LV e' medial:    6.96 cm/s LVOT diam:     2.00 cm  LV E/e' medial:  8.4 LV SV:         44 LV SV Index:   23 LVOT Area:     3.14 cm  RIGHT VENTRICLE RV Basal diam:  3.94 cm TAPSE (M-mode): 3.5 cm LEFT ATRIUM           Index       RIGHT ATRIUM           Index LA diam:      2.00 cm 1.04 cm/m  RA Area:     19.40 cm LA Vol (A2C): 24.8 ml 12.88 ml/m RA Volume:   58.80 ml  30.54 ml/m LA Vol (A4C): 24.8 ml 12.88 ml/m  AORTIC VALVE                   PULMONIC VALVE AV Area (Vmax):    3.11 cm    PV Vmax:        0.48 m/s AV Area (Vmean):   3.48 cm    PV Peak grad:   0.9 mmHg AV Area (VTI):     3.78 cm    RVOT Peak grad: 1 mmHg AV Vmax:           90.35 cm/s AV Vmean:          62.550 cm/s AV VTI:            0.115 m AV Peak Grad:      3.3 mmHg AV Mean Grad:      2.0 mmHg LVOT Vmax:         89.40 cm/s LVOT Vmean:        69.200 cm/s LVOT VTI:          0.139 m LVOT/AV VTI ratio: 1.20  AORTA Ao Root diam: 2.50 cm MITRAL VALVE               TRICUSPID VALVE MV Area (PHT): 8.62 cm    TR Peak grad:   14.0 mmHg MV Decel Time: 88 msec     TR Vmax:  187.00 cm/s MV E velocity: 58.70 cm/s MV A  velocity: 86.80 cm/s  SHUNTS MV E/A ratio:  0.68        Systemic VTI:  0.14 m                            Systemic Diam: 2.00 cm Serafina Royals MD Electronically signed by Serafina Royals MD Signature Date/Time: 08/14/2019/1:54:24 PM    Final      Assessment and plan- Patient is a 59 y.o. 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 Patsey Berthold today.  Patient has been on high-dose steroids and Lasix for the last 3 to 4 days with no significant improvement.  This is her third admission in the last 1 month.  CT findings are suggestive of groundglass opacities and septal thickening which is more towards the center as compared to periphery suggestive of lymphangitic carcinomatosis.  Given her labile respiratory status bronchoscopy could be challenging.  Discussed with the patient that we have tried multiple lines of chemotherapy and she has had disease progression.  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  The only other viable option of treatment left is sacituzumab which is reasonably toxic.  Side effects include infusion reactions, cytopenias, abnormal LFTs as well as increasing dyspnea.  Patient's performance status is presently poor due to underlying hypoxic respiratory failure.  She is therefore not a good candidate for this drug at this time.  We discussed focusing on her quality of life and symptoms and keeping her comfortable with home hospice.  Patient understands that overall prognosis is poor likely less than 3 months.  She is agreeable to home hospice measures.  We also discussed CODE STATUS and she is wanting to be a DNR/DNI at this time  Palliative care to assist further with home hospice transition and hopefully she can be home in the next 1 to 2 days   Total face to face encounter time  for this patient visit was 40 min.      Visit Diagnosis 1. Acute respiratory failure with hypoxia (HCC)   2. 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 COPD exacerbation ([place])   4. Acute pulmonary edema (HCC)      Dr. Randa Evens, MD, MPH [facility] [id number] 08/22/2019 1:30 PM

## 2019-08-22 NOTE — Progress Notes (Signed)
[place]  [phone] [phone]   Name: Ashley Molina Date: 08/22/2019 MRN: [id number]  DOB: 05-17-1961  Patient Care Team: Patient, No Pcp Per as PCP - General ([place]) Bary Castilla, Forest Gleason, MD (General Surgery) Gae Dry, MD as Referring Physician (Obstetrics and Gynecology)    REASON FOR CONSULTATION: Ashley Molina is a 59 y.o. 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 Patient had COVID-19 in December 2020.  PMH also notable for O2 dependent COPD.  She has several recent hospitalizations including 08/06/2019-08/07/2019 with hypoxic respiratory failure thought secondary to pneumonitis secondary to COVID-19 versus gemcitabine.  She was readmitted 08/13/2019-08/16/2019 with same.  She is now readmitted 08/17/2019 again with hypoxic respiratory failure.  Palliative care was consulted help address goals.  CODE STATUS: DNR/DNI  PAST MEDICAL HISTORY: Past Medical History:  Diagnosis Date  . Anemia   . Breast cancer ([place])   . Breast cancer, right ([place]) 04/2017   Hx Lumpectomy, Chemo + Rad tx's.  . Breast cyst, right    aspirated by Dr. Bary Castilla  . COVID-19   . Hyperlipidemia   . Personal history of chemotherapy   . Pre-diabetes     PAST SURGICAL HISTORY:  Past Surgical History:  Procedure Laterality Date  . AXILLARY LYMPH NODE BIOPSY Right 04/09/2017   Procedure: AXILLARY LYMPH NODE BIOPSY;  Surgeon: Robert Bellow, MD;  Location: [facility] ORS;  Service: General;  Laterality: Right;  . BREAST BIOPSY Right 03/27/2017   US guided breast mass - invasive mammary carcinoma  . BREAST BIOPSY Right 03/27/2017   Lymph node - metastatic carcinoma  . BREAST BIOPSY Right 04/09/2017   Lymph node   . BREAST CYST ASPIRATION Right 11/2009   Dr. Bary Castilla did FNA  . BREAST  EXCISIONAL BIOPSY Right 09/28/2017   lumpectomy and 12 lympnode rad neo adj chemo  . COLONOSCOPY  2011  . DILATION AND CURETTAGE OF UTERUS     X3  . ENDOBRONCHIAL ULTRASOUND N/A 04/09/2017   Procedure: ENDOBRONCHIAL ULTRASOUND;  Surgeon: Laverle Hobby, MD;  Location: [facility] ORS;  Service: Pulmonary;  Laterality: N/A;  . ENDOMETRIAL ABLATION    . ENDOMETRIAL BIOPSY  09/2009  . PORTACATH PLACEMENT Left 04/09/2017   Procedure: INSERTION PORT-A-CATH;  Surgeon: Robert Bellow, MD;  Location: [facility] ORS;  Service: General;  Laterality: Left;    HEMATOLOGY/ONCOLOGY HISTORY:  Oncology History  Malignant neoplasm of upper-outer quadrant of right breast in female, estrogen receptor negative ([place])  04/04/2017 Cancer Staging   Staging form: Breast, AJCC 8th Edition - Clinical stage from 04/04/2017: Stage IV (cT3, cN1, cM1, G3, ER-, PR+, HER2-) - Signed by Sindy Guadeloupe, MD on 09/18/2017   04/05/2017 Initial Diagnosis   Malignant neoplasm of upper-outer quadrant of right breast in female, estrogen receptor negative ([place])   09/12/2018 - 01/08/2019 Chemotherapy   The patient had pegfilgrastim (NEULASTA ONPRO KIT) injection 6 mg, 6 mg, Subcutaneous, Once, 4 of 5 cycles Administration: 6 mg (10/03/2018), 6 mg (11/01/2018), 6 mg (10/17/2018), 6 mg (11/14/2018), 6 mg (11/28/2018), 6 mg (12/12/2018), 6 mg (12/26/2018) eriBULin mesylate (HALAVEN) 2.85 mg in sodium chloride 0.9 % 100 mL chemo infusion, 1.4 mg/m2 = 2.85 mg, Intravenous,  Once, 4 of 5 cycles Dose modification: 1.05 mg/m2 (original dose 1.4 mg/m2, Cycle 1, Reason: Other (see comments), Comment: neutropenia), 1.4 mg/m2 (original dose 1.4 mg/m2, Cycle 2,  Reason: Other (see comments)) Administration: 2.85 mg (09/12/2018), 2.15 mg (10/03/2018), 2.85 mg (11/01/2018), 2.85 mg (10/17/2018), 2.85 mg (11/14/2018), 2.85 mg (11/28/2018), 2.85 mg (12/12/2018), 2.85 mg (12/26/2018)  for chemotherapy treatment.    01/20/2019 -  Chemotherapy   The patient had palonosetron  (ALOXI) injection 0.25 mg, 0.25 mg, Intravenous,  Once, 8 of 9 cycles Administration: 0.25 mg (01/30/2019), 0.25 mg (02/20/2019), 0.25 mg (03/21/2019), 0.25 mg (04/10/2019), 0.25 mg (06/05/2019), 0.25 mg (07/17/2019) pegfilgrastim (NEULASTA ONPRO KIT) injection 6 mg, 6 mg, Subcutaneous, Once, 7 of 8 cycles Administration: 6 mg (01/30/2019), 6 mg (03/07/2019), 6 mg (03/27/2019), 6 mg (04/17/2019), 6 mg (06/12/2019), 6 mg (07/03/2019), 6 mg (07/24/2019) CARBOplatin (PARAPLATIN) 260 mg in sodium chloride 0.9 % 250 mL chemo infusion, 260 mg (100 % of original dose 255 mg), Intravenous,  Once, 2 of 2 cycles Dose modification:   (original dose 255 mg, Cycle 1), 260 mg (original dose 255 mg, Cycle 2) Administration: 260 mg (01/20/2019), 260 mg (01/30/2019), 260 mg (02/20/2019) gemcitabine (GEMZAR) 1,596 mg in sodium chloride 0.9 % 250 mL chemo infusion, 800 mg/m2 = 1,596 mg (100 % of original dose 800 mg/m2), Intravenous,  Once, 8 of 9 cycles Dose modification: 800 mg/m2 (original dose 800 mg/m2, Cycle 1, Reason: Other (see comments), Comment: chemo induced neutropenia) Administration: 1,596 mg (01/20/2019), 1,600 mg (01/30/2019), 1,600 mg (02/20/2019), 1,600 mg (03/07/2019), 1,600 mg (03/21/2019), 1,600 mg (03/27/2019), 1,600 mg (04/10/2019), 1,600 mg (04/17/2019), 1,600 mg (05/06/2019), 1,600 mg (06/05/2019), 1,600 mg (06/12/2019), 1,600 mg (06/26/2019), 1,600 mg (07/03/2019), 1,600 mg (07/17/2019), 1,600 mg (07/24/2019)  for chemotherapy treatment.      ALLERGIES:  is allergic to penicillins.  MEDICATIONS:  Current Facility-Administered Medications  Medication Dose Route Frequency Provider Last Rate Last Admin  . 0.9 %  sodium chloride infusion  250 mL Intravenous PRN Mansy, Jan A, MD 20 mL/hr at 08/22/19 0655 250 mL at 08/22/19 0655  . acetaminophen (TYLENOL) tablet 650 mg  650 mg Oral Q4H PRN Mansy, Jan A, MD      . aspirin EC tablet 81 mg  81 mg Oral Daily Mansy, Jan A, MD   81 mg at 08/22/19 0928  . azithromycin  (ZITHROMAX) 500 mg in sodium chloride 0.9 % 250 mL IVPB  500 mg Intravenous Q24H Mansy, Jan A, MD 250 mL/hr at 08/22/19 0934 500 mg at 08/22/19 0934  . budesonide (PULMICORT) nebulizer solution 0.5 mg  0.5 mg Nebulization BID Mansy, Jan A, MD   0.5 mg at 08/22/19 0738  . cefTRIAXone (ROCEPHIN) 2 g in sodium chloride 0.9 % 100 mL IVPB  2 g Intravenous Q24H Mansy, Jan A, MD 200 mL/hr at 08/22/19 0658 2 g at 08/22/19 0658  . Chlorhexidine Gluconate Cloth 2 % PADS 6 each  6 each Topical Daily Elodia Florence., MD   6 each at 08/18/19 1401  . cholecalciferol (VITAMIN D3) tablet 1,000 Units  1,000 Units Oral QHS Mansy, Arvella Merles, MD   1,000 Units at 08/21/19 2154  . diltiazem (CARDIZEM CD) 24 hr capsule 240 mg  240 mg Oral Daily Teodoro Spray, MD   240 mg at 08/22/19 0928  . enoxaparin (LOVENOX) injection 40 mg  40 mg Subcutaneous Q24H Mansy, Jan A, MD   40 mg at 08/22/19 0615  . ferrous sulfate tablet 325 mg  325 mg Oral QHS Mansy, Jan A, MD   325 mg at 08/21/19 2154  . furosemide (LASIX) tablet 20 mg  20 mg Oral BID Para Skeans,  MD   20 mg at 08/22/19 0928  . guaiFENesin-dextromethorphan (ROBITUSSIN DM) 100-10 MG/5ML syrup 15 mL  15 mL Oral Q4H PRN Mansy, Jan A, MD      . ipratropium (ATROVENT) nebulizer solution 0.5 mg  0.5 mg Nebulization Q6H Elodia Florence., MD   0.5 mg at 08/22/19 1344  . ipratropium-albuterol (DUONEB) 0.5-2.5 (3) MG/3ML nebulizer solution 3 mL  3 mL Nebulization Once Blake Divine, MD      . levalbuterol [facility]) nebulizer solution 0.63 mg  0.63 mg Nebulization Q6H Elodia Florence., MD   0.63 mg at 08/22/19 1344  . LORazepam (ATIVAN) tablet 0.5 mg  0.5 mg Oral Q6H PRN Rosangela Fehrenbach, Kirt Boys, NP   0.5 mg at 08/19/19 1132  . methylPREDNISolone sodium succinate (SOLU-MEDROL) 125 mg/2 mL injection 60 mg  60 mg Intravenous Q4H Florina Ou V, MD   60 mg at 08/22/19 1120  . morphine 2 MG/ML injection 1-2 mg  1-2 mg Intravenous Q2H PRN Kailand Seda, Kirt Boys, NP   1 mg at  08/22/19 1039  . multivitamin with minerals tablet 1 tablet  1 tablet Oral QHS Mansy, Jan A, MD   1 tablet at 08/21/19 2155  . omega-3 acid ethyl esters (LOVAZA) capsule 1 g  1 g Oral Daily Mansy, Jan A, MD   1 g at 08/22/19 6712  . ondansetron (ZOFRAN) injection 4 mg  4 mg Intravenous Q6H PRN Mansy, Jan A, MD      . pantoprazole (PROTONIX) EC tablet 20 mg  20 mg Oral Daily Mansy, Jan A, MD   20 mg at 08/22/19 0929  . polyethylene glycol (MIRALAX / GLYCOLAX) packet 17 g  17 g Oral Daily Lang Snow, NP   17 g at 08/22/19 0616  . potassium chloride SA (KLOR-CON) CR tablet 20 mEq  20 mEq Oral Daily Teodoro Spray, MD   20 mEq at 08/22/19 0928  . sodium chloride flush (NS) 0.9 % injection 3 mL  3 mL Intravenous Q12H Mansy, Jan A, MD   3 mL at 08/22/19 0930  . sodium chloride flush (NS) 0.9 % injection 3 mL  3 mL Intravenous PRN Mansy, Jan A, MD      . sulfamethoxazole-trimethoprim (BACTRIM DS) 800-160 MG per tablet 1 tablet  1 tablet Oral Once per day on Mon Wed Fri Mansy, Jan A, MD   1 tablet at 08/22/19 1120    VITAL SIGNS: BP 131/81 (BP Location: Left Arm)   Pulse (!) 105   Temp 98.2 F (36.8 C) (Oral)   Resp 20   Ht 5' 3"  (1.6 m)   Wt 190 lb 0.6 oz (86.2 kg)   LMP 04/04/2012 (Approximate)   SpO2 93%   BMI 33.66 kg/m  Filed Weights   08/20/19 0500 08/21/19 0424 08/22/19 0644  Weight: 203 lb 14.8 oz (92.5 kg) 203 lb 7.8 oz (92.3 kg) 190 lb 0.6 oz (86.2 kg)    Estimated body mass index is 33.66 kg/m as calculated from the following:   Height as of this encounter: 5' 3"  (1.6 m).   Weight as of this encounter: 190 lb 0.6 oz (86.2 kg).  LABS: CBC:    Component Value Date/Time   WBC 10.3 08/22/2019 0251   HGB 11.9 (L) 08/22/2019 0251   HGB 12.9 03/28/2017 0805   HCT 37.5 08/22/2019 0251   HCT 38.2 03/28/2017 0805   PLT 204 08/22/2019 0251   PLT 245 03/28/2017 0805   MCV 89.9 08/22/2019 0251  MCV 81 03/28/2017 0805   NEUTROABS 9.1 (H) 08/22/2019 0251   NEUTROABS  2.7 03/28/2017 0805   LYMPHSABS 0.4 (L) 08/22/2019 0251   LYMPHSABS 1.1 03/28/2017 0805   MONOABS 0.6 08/22/2019 0251   EOSABS 0.0 08/22/2019 0251   EOSABS 0.1 03/28/2017 0805   BASOSABS 0.0 08/22/2019 0251   BASOSABS 0.1 03/28/2017 0805   Comprehensive Metabolic Panel:    Component Value Date/Time   NA 135 08/22/2019 0251   NA 140 03/28/2017 0805   K 3.3 (L) 08/22/2019 0251   CL 91 (L) 08/22/2019 0251   CO2 31 08/22/2019 0251   BUN 33 (H) 08/22/2019 0251   BUN 15 03/28/2017 0805   CREATININE 0.70 08/22/2019 0251   GLUCOSE 135 (H) 08/22/2019 0251   CALCIUM 9.8 08/22/2019 0251   AST 22 08/22/2019 0251   ALT 21 08/22/2019 0251   ALKPHOS 80 08/22/2019 0251   BILITOT 0.8 08/22/2019 0251   BILITOT 0.4 03/28/2017 0805   PROT 6.0 (L) 08/22/2019 0251   PROT 6.9 03/28/2017 0805   ALBUMIN 3.3 (L) 08/22/2019 0251   ALBUMIN 4.3 03/28/2017 0805    RADIOGRAPHIC STUDIES: DG Chest 1 View  Result Date: 08/18/2019 CLINICAL DATA:  59 year old female with shortness of breath. EXAM: CHEST  1 VIEW COMPARISON:  Chest radiograph dated 08/14/2019 and CT dated 08/06/2019 FINDINGS: Right hilar post treatment changes. There is diffuse interstitial prominence and lower lung field hazy densities likely representing edema. Pneumonia is not excluded. Clinical correlation is recommended. There is no pneumothorax. There is a small right pleural effusion. Stable cardiac silhouette. Left pectoral Port-A-Cath with tip close to the cavoatrial junction. Atherosclerotic calcification of the aorta. No acute osseous pathology. IMPRESSION: Findings likely representing edema. Pneumonia is not excluded. Electronically Signed   By: Anner Crete M.D.   On: 08/18/2019 00:50   CT Angio Chest PE W and/or Wo Contrast  Result Date: 08/06/2019 CLINICAL DATA:  Hemoptysis, history of breast cancer and COVID EXAM: CT ANGIOGRAPHY CHEST WITH CONTRAST TECHNIQUE: Multidetector CT imaging of the chest was performed using the standard  protocol during bolus administration of intravenous contrast. Multiplanar CT image reconstructions and MIPs were obtained to evaluate the vascular anatomy. CONTRAST:  13m OMNIPAQUE IOHEXOL 350 MG/ML SOLN COMPARISON:  July 09, 2019 FINDINGS: Cardiovascular: There is a optimal opacification of the pulmonary arteries. There is no central,segmental, or subsegmental filling defects within the pulmonary arteries. A left-sided MediPort catheter is seen with the tip in the right atrium. The heart is normal in size. No pericardial effusion or thickening. No evidence right heart strain. There is normal three-vessel brachiocephalic anatomy without proximal stenosis. Scattered mild aortic atherosclerosis. Mediastinum/Nodes: No hilar, mediastinal, or axillary adenopathy. Thyroid gland, trachea, and esophagus demonstrate no significant findings. Lungs/Pleura: There is new/worsening multifocal patchy predominantly peripherally based ground-glass opacities seen throughout both lungs. There is also slight interval worsening in the tree-in-bud nodular hazy opacities within the left lung apex. There is unchanged spiculated masslike consolidation within the right infrahilar region, likely from post treatment changes/fibrosis. There is a trace right pleural effusion present. Upper Abdomen: No acute abnormalities present in the visualized portions of the upper abdomen. Musculoskeletal: No chest wall abnormality. No acute or significant osseous findings. Again noted is diffuse skin thickening seen along the right breast. There is a surgical clips within the right breast. Review of the MIP images confirms the above findings. IMPRESSION: 1. No central, segmental, or subsegmental pulmonary embolism. 2. Interval worsening of the multifocal patchy ground-glass opacities throughout both  lungs, consistent with multifocal pneumonia. 3. Interval slight worsening in the tree-in-bud nodular opacities within the left upper lobe, likely due to  atypical infectious etiology, however cannot exclude underlying metastatic disease. 4. Post treatment radiation/fibrotic changes in the right infrahilar region. 5. Trace right pleural effusion Electronically Signed   By: Prudencio Pair M.D.   On: 08/06/2019 00:17   CT Chest High Resolution  Result Date: 08/21/2019 CLINICAL DATA:  Hypoxemia. COVID-19 in December. Breast cancer with lung metastasis status post chemotherapy and radiotherapy. EXAM: CT CHEST WITHOUT CONTRAST TECHNIQUE: Multidetector CT imaging of the chest was performed following the standard protocol without intravenous contrast. High resolution imaging of the lungs, as well as inspiratory and expiratory imaging, was performed. COMPARISON:  08/18/2019 chest radiograph. 08/06/2019 chest CT angiogram. FINDINGS: Cardiovascular: Normal heart size. Trace pericardial effusion/thickening is stable. Left subclavian Port-A-Cath terminates at the cavoatrial junction. Mildly atherosclerotic nonaneurysmal thoracic aorta. Dilated main pulmonary artery (3.5 cm diameter). Mediastinum/Nodes: No discrete thyroid nodules. Unremarkable esophagus. Surgical clips again noted in the right axilla. No pathologically enlarged axillary lymph nodes. No discrete mediastinal adenopathy. Lungs/Pleura: No pneumothorax. Small dependent right and trace dependent left pleural effusions are unchanged. There is extensive patchy irregular thickening of the peribronchovascular interstitium and interlobular septa throughout both lungs, most prominent in the left lower lobe, which appears to be progressively worsening on multiple chest CT studies back to 04/04/2019. Central perihilar prominent interstitial thickening also appears progressively increased back to 04/04/2019 CT. Patchy ground-glass opacities throughout both lungs have worsened. Similarly, there is extensive patchy sub solid pulmonary nodularity throughout both lungs with progressive enlargement back to 07/09/2019 chest CT.  Representative basilar right lower lobe 1.8 cm nodule (series 3/image 127), previously 0.6 cm on 07/09/2019 and 1.4 cm on 08/06/2019 CT. Representative right upper lobe 1.1 cm nodule (series 3/image 41), new. Medial left lower lobe 1.7 cm nodule (series 3/image 113), previously 0.9 cm on 07/09/2019 and 1.1 cm on 08/06/2019. No significant regions of bronchiectasis. No honeycombing. Moderate patchy air trapping in both lungs on the expiration sequence. Upper abdomen: No acute abnormality. Musculoskeletal: No aggressive appearing focal osseous lesions. Mild thoracic spondylosis. IMPRESSION: 1. Extensive patchy irregular peribronchovascular and interlobular septal thickening, patchy ground-glass opacities, subsolid nodularity and irregular perihilar consolidation throughout both lungs, all progressively worsening on multiple chest CT studies back to 04/04/2019. 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 2. Stable small dependent right and trace dependent left pleural effusions. 3. Moderate patchy air trapping in both lungs, indicative of small airways disease. 4. Dilated main pulmonary artery, suggesting pulmonary arterial hypertension. 5. Aortic Atherosclerosis (ICD10-I70.0). Electronically Signed   By: Ilona Sorrel M.D.   On: 08/21/2019 12:47   DG Chest Port 1 View  Result Date: 08/14/2019 CLINICAL DATA:  Acute respiratory failure. EXAM: PORTABLE CHEST 1 VIEW COMPARISON:  Chest x-ray 08/13/2019 FINDINGS: The left subclavian power port is stable. Stable radiation changes involving the right hilum and right paramediastinal lung. Persistent patchy nodular infiltrates and small right effusion. No pneumothorax. IMPRESSION: Stable chest x-ray. Persistent patchy nodular infiltrates and small right effusion. Electronically Signed   By: Marijo Sanes M.D.   On: 08/14/2019 06:19   DG Chest Port 1 View  Result  Date: 08/13/2019 CLINICAL DATA:  Shortness of breath, tachypnea with decreased oxygen saturations., history of breast cancer. EXAM: PORTABLE CHEST 1 VIEW COMPARISON:  08/06/2019, CT of the chest. FINDINGS: Masslike appearance of right hilum similar to previous  exam. Cardiomediastinal contours are stable. Left Port-A-Cath terminates at the caval to atrial junction. Nodular opacities are again suggested and there is increase in interstitial prominence when compared to the chest x-ray from 08/05/2019. Blunting of right costophrenic angles suggest scarring or small effusion. No signs of dense consolidation aside from perihilar changes discussed above. Visualized skeletal structures without acute bone finding. IMPRESSION: Masslike appearance of right hilum similar to previous exam. Nodular opacities are again suggested and there is increase in interstitial prominence when compared to the chest x-ray from 08/05/2019. Findings may represent worsening of pulmonary parenchymal disease, associated with previously reported COVID-19 infection, particularly in the left lung base, potentially with a background of pulmonary edema. Electronically Signed   By: Zetta Bills M.D.   On: 08/13/2019 15:26   DG Chest Portable 1 View  Result Date: 08/05/2019 CLINICAL DATA:  59 year old female with hemoptysis. History of breast cancer. EXAM: PORTABLE CHEST 1 VIEW COMPARISON:  Chest radiograph dated 04/09/2017. CT dated 07/09/2019. FINDINGS: Port-A-Cath with tip in the region of the cavoatrial junction. Right hilar density corresponding to the post treatment/post radiation fibrosis seen on the prior CT. Diffuse interstitial and vascular prominence with scattered faint reticulonodular densities throughout the lungs. Probable small bilateral pleural effusions. No pneumothorax. The cardiac silhouette is within normal limits. Atherosclerotic calcification of the aorta. No acute osseous pathology. IMPRESSION: Right hilar post radiation  fibrosis and scattered bilateral reticulonodular densities and small bilateral pleural effusions. Overall findings are similar to the CT of 07/09/2019. Electronically Signed   By: Anner Crete M.D.   On: 08/05/2019 22:59   ECHOCARDIOGRAM COMPLETE  Result Date: 08/14/2019    ECHOCARDIOGRAM REPORT   Patient Name:   Ashley Molina Date of Exam: 08/14/2019 Medical Rec #:  [id number]          Height:       63.0 in Accession #:    [id number]         Weight:       198.0 lb Date of Birth:  13-Nov-1960         BSA:          1.925 m Patient Age:    87 years           BP:           105/71 mmHg Patient Gender: F                  HR:           95 bpm. Exam Location:  [facility] Procedure: 2D Echo, Cardiac Doppler and Color Doppler Indications:     Dyspnea 786.09  History:         Patient has prior history of Echocardiogram examinations, most                  recent 04/22/2019. Breast cancer, covid-19, history of                  chemotherapy.  Sonographer:     [name] RDCS (AE) Referring Phys:  [id number] [facility] AMIN Diagnosing Phys: Serafina Royals MD  Sonographer Comments: Suboptimal apical window. IMPRESSIONS  1. Left ventricular ejection fraction, by estimation, is 55 to 60%. The left ventricle has normal function. The left ventricle has no regional wall motion abnormalities. Left ventricular diastolic parameters were normal.  2. Right ventricular systolic function is normal. The right ventricular size is normal. There is normal pulmonary artery systolic pressure.  3. The mitral valve is normal in  structure. Trivial mitral valve regurgitation.  4. The aortic valve is normal in structure. Aortic valve regurgitation is not visualized. FINDINGS  Left Ventricle: Left ventricular ejection fraction, by estimation, is 55 to 60%. The left ventricle has normal function. The left ventricle has no regional wall motion abnormalities. The left ventricular internal cavity size was normal in size. There is  no left ventricular  hypertrophy. Left ventricular diastolic parameters were normal. Right Ventricle: The right ventricular size is normal. No increase in right ventricular wall thickness. Right ventricular systolic function is normal. There is normal pulmonary artery systolic pressure. The tricuspid regurgitant velocity is 1.87 m/s, and  with an assumed right atrial pressure of 10 mmHg, the estimated right ventricular systolic pressure is 84.1 mmHg. Left Atrium: Left atrial size was normal in size. Right Atrium: Right atrial size was normal in size. Pericardium: There is no evidence of pericardial effusion. Mitral Valve: The mitral valve is normal in structure. Trivial mitral valve regurgitation. Tricuspid Valve: The tricuspid valve is normal in structure. Tricuspid valve regurgitation is trivial. Aortic Valve: The aortic valve is normal in structure. Aortic valve regurgitation is not visualized. Aortic valve mean gradient measures 2.0 mmHg. Aortic valve peak gradient measures 3.3 mmHg. Aortic valve area, by VTI measures 3.78 cm. Pulmonic Valve: The pulmonic valve was normal in structure. Pulmonic valve regurgitation is not visualized. Aorta: The aortic root and ascending aorta are structurally normal, with no evidence of dilitation. IAS/Shunts: No atrial level shunt detected by color flow Doppler.  LEFT VENTRICLE PLAX 2D LVIDd:         3.86 cm  Diastology LVIDs:         2.43 cm  LV e' lateral:   9.79 cm/s LV PW:         0.82 cm  LV E/e' lateral: 6.0 LV IVS:        0.82 cm  LV e' medial:    6.96 cm/s LVOT diam:     2.00 cm  LV E/e' medial:  8.4 LV SV:         44 LV SV Index:   23 LVOT Area:     3.14 cm  RIGHT VENTRICLE RV Basal diam:  3.94 cm TAPSE (M-mode): 3.5 cm LEFT ATRIUM           Index       RIGHT ATRIUM           Index LA diam:      2.00 cm 1.04 cm/m  RA Area:     19.40 cm LA Vol (A2C): 24.8 ml 12.88 ml/m RA Volume:   58.80 ml  30.54 ml/m LA Vol (A4C): 24.8 ml 12.88 ml/m  AORTIC VALVE                   PULMONIC VALVE AV  Area (Vmax):    3.11 cm    PV Vmax:        0.48 m/s AV Area (Vmean):   3.48 cm    PV Peak grad:   0.9 mmHg AV Area (VTI):     3.78 cm    RVOT Peak grad: 1 mmHg AV Vmax:           90.35 cm/s AV Vmean:          62.550 cm/s AV VTI:            0.115 m AV Peak Grad:      3.3 mmHg AV Mean Grad:      2.0  mmHg LVOT Vmax:         89.40 cm/s LVOT Vmean:        69.200 cm/s LVOT VTI:          0.139 m LVOT/AV VTI ratio: 1.20  AORTA Ao Root diam: 2.50 cm MITRAL VALVE               TRICUSPID VALVE MV Area (PHT): 8.62 cm    TR Peak grad:   14.0 mmHg MV Decel Time: 88 msec     TR Vmax:        187.00 cm/s MV E velocity: 58.70 cm/s MV A velocity: 86.80 cm/s  SHUNTS MV E/A ratio:  0.68        Systemic VTI:  0.14 m                            Systemic Diam: 2.00 cm Serafina Royals MD Electronically signed by Serafina Royals MD Signature Date/Time: 08/14/2019/1:54:24 PM    Final     PERFORMANCE STATUS (ECOG) : 3 - Symptomatic, >50% confined to bed  Review of Systems Unless otherwise noted, a complete review of systems is negative.  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  Patient has been seen in consultation by medical oncology and pulmonary and has decided to go home and focus on comfort with hospice.  I met with patient and her husband.  We discussed hospice involvement in detail.  She will be seen later today by the hospice liaison for [facility].  We discussed the possible future use of the hospice inpatient unit if necessary for symptom management for end-of-life care.  We will plan to send prescriptions to her pharmacy for morphine elixir and lorazepam so that these are available to her if she discharges over the weekend.  Patient confirmed DNR/DNI.  I signed a portable DNR order, which was  given to her husband.  PLAN: -Best supportive care -Home with hospice when medically ready -DNR/DNI -Morphine elixir 50m/mL 5-116mQ2H for pain/dyspnea (#3059m-Lorazepam 0.5mg64m Q4H prn for anxiety (#30)   Time Total: 30 minutes  Visit consisted of counseling and education dealing with the complex and emotionally intense issues of symptom management and palliative care in the setting of serious and potentially life-threatening illness.Greater than 50%  of this time was spent counseling and coordinating care related to the above assessment and plan.  Signed by: JoshAltha HarmD, NP-C

## 2019-08-22 NOTE — Progress Notes (Addendum)
New referral for [employer] services at home received from [place]. Writer met in the room with patient and her husband Ashley Molina to initiate education regarding hospice services, philosophy and team approach to care with understanding voiced. Questions answered. DME needs discussed and ordered for delivery Monday as requested. Hospice contact numbers given to [name] to follow on Monday. TOC Becky Dupree updated. Patient information given to referral. Thank you for the opportunity to be involved in the care of this patient and her family.  Flo Shanks BSN, RN, [facility] [phone]

## 2019-08-22 NOTE — Progress Notes (Signed)
Physical Therapy Treatment Patient Details Name: Ashley Molina MRN: [id number] DOB: [birth year] Today's Date: 08/22/2019    History of Present Illness Pt is a 59 y.o. Caucasian female with a known history of breast cancer with lung metastasis status post lumpectomy, chemotherapy and radiotherapy, COVID-19 in December 2020, dyslipidemia and anemia, who presented to [facility] worsening dyspnea with associated cough with inability to expectorate as well as wheezing today.  The patient was just discharged from here on 3/10 after being treated for acute on chronic respiratory failure requiring BiPAP. elevated troponin per cardiology due to demand ischemia.    PT Comments    Pt received sitting up in recliner with spouse present. Pt agreeable to PT. Pt is progressing well towards PT goals. Pt progressed ambulation distance initially requesting to ambulate with RW due to feeling unsteady. Pt then ambulated without AD. No overt LOB or unsteadiness noted with or without RW. Pt ambulates with slow gait speed and stops every few steps. Upon return to room, pt performed standing LE therex (see below) for improved strengthening. Pt on 3L O2 nasal cannula t/o session. HR 105-115 and O2 sats 91%+ during ambulation and therex.  Pt encouraged to perform therex on her own and continue progressing activity and activity tolerance.    Follow Up Recommendations  Home health PT;Supervision for mobility/OOB     Equipment Recommendations  None recommended by PT    Recommendations for Other Services       Precautions / Restrictions Precautions Precautions: Fall Precaution Comments: watch O2 Restrictions Weight Bearing Restrictions: No    Mobility  Bed Mobility Overal bed mobility: Modified Independent             General bed mobility comments: pt up in recliner at start of session  Transfers Overall transfer level: Independent Equipment used: None   Sit to Stand: Independent          General transfer comment: steady with STS from recliner, no AD used  Ambulation/Gait Ambulation/Gait assistance: Modified independent (Device/Increase time) Gait Distance (Feet): 200 Feet Assistive device: None;Rolling walker (2 wheeled) Gait Pattern/deviations: Step-through pattern;Decreased stride length Gait velocity: decreased   General Gait Details: pt ambulated one lap with RW initially stating she wanted the walker today due to feeling unsteady, second lap no AD, no overt LOB or unsteadiness noted with or without AD, pt with slow gait speed and stops every few steps to rest, O2 sats dropped to 89% once quickly recovering and rest of time 91%+, HR 105-112 during ambulation   Stairs             Wheelchair Mobility    Modified Rankin (Stroke Patients Only)       Balance Overall balance assessment: Mild deficits observed, not formally tested                                          Cognition Arousal/Alertness: Awake/alert Behavior During Therapy: WFL for tasks assessed/performed Overall Cognitive Status: Within Functional Limits for tasks assessed                                        Exercises Other Exercises Other Exercises: standing LE therex 10 reps each of heel raises, hip abd, hip ext at counter with BUE support, pt completing in sets  of 5, vitals stable t/o    General Comments        Pertinent Vitals/Pain Pain Assessment: No/denies pain    Home Living                      Prior Function            PT Goals (current goals can now be found in the care plan section) Progress towards PT goals: Progressing toward goals    Frequency    Min 2X/week      PT Plan Current plan remains appropriate    Co-evaluation              AM-PAC PT "6 Clicks" Mobility   Outcome Measure  Help needed turning from your back to your side while in a flat bed without using bedrails?: None Help needed  moving from lying on your back to sitting on the side of a flat bed without using bedrails?: None Help needed moving to and from a bed to a chair (including a wheelchair)?: A Little Help needed standing up from a chair using your arms (e.g., wheelchair or bedside chair)?: A Little Help needed to walk in hospital room?: A Little Help needed climbing 3-5 steps with a railing? : A Little 6 Click Score: 20    End of Session Equipment Utilized During Treatment: Gait belt;Oxygen(3L) Activity Tolerance: Patient tolerated treatment well Patient left: in chair;with call bell/phone within reach;with family/visitor present Nurse Communication: Mobility status PT Visit Diagnosis: Difficulty in walking, not elsewhere classified (R26.2)     Time: [id number] PT Time Calculation (min) (ACUTE ONLY): 30 min  Charges:  $Therapeutic Exercise: 23-37 mins                     Zachary George PT, DPT 4:15 PM,08/22/19 [phone]    Issam Carlyon Drucilla Chalet 08/22/2019, 4:12 PM

## 2019-08-23 LAB — COMPREHENSIVE METABOLIC PANEL
ALT: 20 U/L (ref 0–44)
AST: 18 U/L (ref 15–41)
Albumin: 2.8 g/dL — ABNORMAL LOW (ref 3.5–5.0)
Alkaline Phosphatase: 64 U/L (ref 38–126)
Anion gap: 28 — ABNORMAL HIGH (ref 5–15)
BUN: 24 mg/dL — ABNORMAL HIGH (ref 6–20)
CO2: 27 mmol/L (ref 22–32)
Calcium: 8.4 mg/dL — ABNORMAL LOW (ref 8.9–10.3)
Chloride: 79 mmol/L — ABNORMAL LOW (ref 98–111)
Creatinine, Ser: 0.59 mg/dL (ref 0.44–1.00)
GFR calc Af Amer: >60 mL/min (ref >60)
GFR calc non Af Amer: >60 mL/min (ref >60)
Glucose, Bld: 130 mg/dL — ABNORMAL HIGH (ref 70–99)
Potassium: 3.7 mmol/L (ref 3.5–5.1)
Sodium: 134 mmol/L — ABNORMAL LOW (ref 135–145)
Total Bilirubin: 0.7 mg/dL (ref 0.3–1.2)
Total Protein: 5.1 g/dL — ABNORMAL LOW (ref 6.5–8.1)

## 2019-08-23 LAB — CBC WITH DIFFERENTIAL/PLATELET
Abs Immature Granulocytes: 0.1 10*3/uL — ABNORMAL HIGH (ref 0.00–0.07)
Basophils Absolute: 0 10*3/uL (ref 0.0–0.1)
Basophils Relative: 0 %
Eosinophils Absolute: 0 10*3/uL (ref 0.0–0.5)
Eosinophils Relative: 0 %
HCT: 37.2 % (ref 36.0–46.0)
Hemoglobin: 11.9 g/dL — ABNORMAL LOW (ref 12.0–15.0)
Immature Granulocytes: 2 %
Lymphocytes Relative: 3 %
Lymphs Abs: 0.2 10*3/uL — ABNORMAL LOW (ref 0.7–4.0)
MCH: 28.7 pg (ref 26.0–34.0)
MCHC: 32 g/dL (ref 30.0–36.0)
MCV: 89.9 fL (ref 80.0–100.0)
Monocytes Absolute: 0.2 10*3/uL (ref 0.1–1.0)
Monocytes Relative: 4 %
Neutro Abs: 5.9 10*3/uL (ref 1.7–7.7)
Neutrophils Relative %: 91 %
Platelets: 207 10*3/uL (ref 150–400)
RBC: 4.14 MIL/uL (ref 3.87–5.11)
RDW: 14.9 % (ref 11.5–15.5)
WBC: 6.4 10*3/uL (ref 4.0–10.5)
nRBC: 0 % (ref 0.0–0.2)

## 2019-08-23 LAB — CULTURE, BLOOD (ROUTINE X 2)
Culture: NO GROWTH
Culture: NO GROWTH
Special Requests: ADEQUATE
Special Requests: ADEQUATE

## 2019-08-23 LAB — MAGNESIUM: Magnesium: 2.1 mg/dL (ref 1.7–2.4)

## 2019-08-23 MED ORDER — BUDESONIDE 0.5 MG/2ML IN SUSP
0.5000 mg | Freq: Two times a day (BID) | RESPIRATORY_TRACT | Status: DC
  Administered 2019-08-23 – 2019-08-25 (×4): 0.5 mg via RESPIRATORY_TRACT
  Filled 2019-08-23 (×4): qty 2

## 2019-08-23 NOTE — Plan of Care (Signed)

## 2019-08-23 NOTE — Progress Notes (Signed)
D: Pt alert and oriented x 4. Pt denies experiencing any pain at this time.   A: Scheduled medications administered to pt, per MD orders. Support and encouragement provided. Frequent verbal contact made.    R: No adverse drug reactions noted. Pt complaint with medications and treatment plan. Pt interacts well with others on the unit. Pt is stable at this time, Will continue to monitor and provide care for as ordered.

## 2019-08-23 NOTE — Progress Notes (Signed)
PROGRESS NOTE    Ashley Molina  RJJ:[id number] DOB: 04/12/1961 DOA: 08/17/2019 PCP: Patient, No Pcp Per   Brief Narrative:  Ashley Molina  is a 59 y.o. Caucasian female with a known history of breast cancer with lung metastasis status post lumpectomy, chemotherapy and radiotherapy, COVID-19 in December 2020, dyslipidemia and anemia, who presented to [facility] worsening dyspnea with associated cough with inability to expectorate as well as wheezing today.  The patient was just discharged from here on 3/10 after being treated for acute on chronic respiratory failure requiring BiPAP.  No nausea or vomiting or abdominal pain.  She denies any chest pain or palpitations.  No fever or chills.  No dysuria, oliguria or hematuria or flank pain.  Upon presentation to the emergency room, heart rate was 111 has gone up to 142 with otherwise normal vital signs.  EKG showed atrial fibrillation/flutter with a rate of 165.  Respiratory clear unless 25-28.  Labs revealed potassium of 3.6 magnesium 2.2 anion gap of 15.  BNP was 400.  High-sensitivity troponin I was 38 and later 98 and CBC showed leukocytosis of 13.9.  Influenza antigens and COVID-19 PCR came back negative.  Chest x-ray showed findings representing pulmonary edema with pneumonia not excluded.  She's currently admitted for COPD exacerbation and is being treated with lasix for pulmonary edema/heart failure as well.   Assessment & Plan:   Active Problems:   COPD exacerbation ([place])   Palliative care encounter  1.  Acute Hypoxic Respiratory Failure  COPD acute exacerbation with subsequent acute cor pulmonale with mild interstitial pulmonary edema. - CXR 3/15 with edema - Steroids, scheduled and prn nebs, pulmicort - lasix changed to po 20 mg bid. -She will be placed on scheduled and as needed duo nebs. 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  Hx of covid and radiation induced pneumonitis as well.  Multiple possible contributing factors.  Will c/s pulm to follow. -pt is on oxygen via 4 l [place] currently and her saturation are high 90"s and exam is CLTABL. 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 D/w her about this result and she is calm and sad. I have given her morphine which has alleviated  her sob and anxiety.    2. Sinus Tachycardia with PAC vs Fib/Flutter  Elevated Troponin - some question of possible afib/flutter, though suspect this is sinus tach with PAC's - will consult cardiology with elevated troponin and tachyarrythmia, suspect this is demand ischemia - recent echo with normal EF -continue diltiazem.  3.  GERD. -PPI therapy continued  4.  Hypertension. -continued on diltiazem. -BP soft due to additional iv diuretic therapy   5.  DVT prophylaxis. -Subcutaneous Lovenox  6.Hypokalemia: pt given 20 meq of kcl iv x1.    DVT prophylaxis: lovenox Code Status: full Family Communication: none at bedside Disposition Plan:  . Patient came from: home            . Anticipated d/c place: home . Barriers to d/c OR conditions which need to be met to effect a safe d/c: pending improvement in resp status . Anticipate d/c in am.  Consultants:   Oncology  Intensivist.  Cardiology Procedures:   none  Antimicrobials:  Anti-infectives (  From admission, onward)   Start     Dose/Rate Route Frequency Ordered Stop   08/18/19 0900  sulfamethoxazole-trimethoprim (BACTRIM DS) 800-160 MG per tablet 1 tablet     1 tablet Oral Once per day on Mon Wed Fri 08/18/19 0622     08/18/19 0700  cefTRIAXone (ROCEPHIN) 2 g in sodium chloride 0.9 % 100 mL IVPB     2  g 200 mL/hr over 30 Minutes Intravenous Every 24 hours 08/18/19 0647     08/18/19 0700  azithromycin (ZITHROMAX) 500 mg in sodium chloride 0.9 % 250 mL IVPB     500 mg 250 mL/hr over 60 Minutes Intravenous Every 24 hours 08/18/19 0647       Subjective: SOB improved today.  08/20/2019: Blood pressure 110/70, pulse 94, temperature 98.1 F (36.7 C), temperature source Oral, resp. rate 16, height _0  (1.6 m), weight 86.2 kg, last menstrual period 04/04/2012, SpO2 100 %.  Pt is alert awake and coughing after taking deep breaths. She has IS at bedside.  She Denies any other complaints.   08/21/19: Pt is alert and sitting on bedside recliner. She is on 4 Lnc and is comfortable with little sob / cough. Labs today show hypokalemia due to diuretic therapy, and wbc count of 12.9 down from 14/7.  3/19 Today pt was sob upon my arrival to her room and has to be restarted on her steroid therapy , Morphine given to pt for sob and both alleviated her sob , later on checking on pt she was sitting on recliner and  Stable, says she is breathing better d/w her results of her ct and she is calm and sad.   3/20 Patient today is in good spirits with daughter at bedside, is relieved and content that she can finally make a decision For her ongoing care.She has decided with home hospice.    Objective: Vitals:   08/23/19 1049 08/23/19 1458 08/23/19 1541 08/23/19 1623  BP: 105/77  101/63 110/70  Pulse: 98  (!) 59 94  Resp:   17 16  Temp:   98.3 F (36.8 C) 98.1 F (36.7 C)  TempSrc:   Oral Oral  SpO2: 96% 97% 96% 100%  Weight:      Height:        Intake/Output Summary (Last 24 hours) at 08/23/2019 1752 Last data filed at 08/23/2019 0600 Gross per 24 hour  Intake 3 ml  Output 800 ml  Net -797 ml   Filed Weights   08/20/19 0500 08/21/19 0424 08/22/19 0644  Weight: 92.5 kg 92.3 kg 86.2 kg    Examination: Blood pressure 110/70, pulse 94, temperature 98.1 F (36.7 C), temperature source  Oral, resp. rate 16, height _1  (1.6 m), weight 86.2 kg, last menstrual period 04/04/2012, SpO2 100 %. General: No acute distress. Cardiovascular: Heart sounds show a tachycardic rate Lungs: mild wheezing bl on expiration , improved air entry.  Abdomen: Soft, nontender, nondistended Neurological: Alert and oriented 3. Moves all extremities 4. Cranial nerves II through XII grossly intact. Skin: Warm and dry. No rashes or lesions. Extremities: No clubbing or cyanosis.   Data Reviewed:   CBC: Recent Labs  Lab 08/19/19 0444 08/20/19 0624 08/21/19 1311 08/22/19 0251 08/23/19 0441  WBC 14.7* 12.9* 17.6* 10.3 6.4  NEUTROABS 13.3* 12.0* 15.2* 9.1* 5.9  HGB 12.1 12.1 13.0 11.9* 11.9*  HCT 39.0 36.8 39.8 37.5 37.2  MCV 91.5 89.5 89.0 89.9 89.9  PLT 186 188 224 204 207   Basic  Metabolic Panel: Recent Labs  Lab 08/18/19 0045 08/19/19 0444 08/20/19 0624 08/22/19 0251 08/23/19 0441  NA 139 141 138 135 134*  K 3.6 3.9 3.2* 3.3* 3.7  CL 96* 96* 93* 91* 79*  CO2 28 31 32 31 27  GLUCOSE 131* 142* 140* 135* 130*  BUN 30* 32* 34* 33* 24*  CREATININE 0.74 0.81 0.72 0.70 0.59  CALCIUM 9.9 10.1 9.7 9.8 8.4*  MG 2.2 2.4 2.3 2.4 2.1  PHOS  --  4.3 3.9 3.1  --    GFR: Estimated Creatinine Clearance: 77.8 mL/min (by C-G formula based on SCr of 0.59 mg/dL). Liver Function Tests: Recent Labs  Lab 08/18/19 0045 08/19/19 0444 08/20/19 0624 08/22/19 0251 08/23/19 0441  AST _0 ALT _1 ALKPHOS 105 88 80 80 64  BILITOT 0.9 0.8 1.0 0.8 0.7  PROT 6.9 6.3* 6.0* 6.0* 5.1*  ALBUMIN 4.0 3.4* 3.3* 3.3* 2.8*   No results for input(s): LIPASE, AMYLASE in the last 168 hours. No results for input(s): AMMONIA in the last 168 hours. Coagulation Profile: No results for input(s): INR, PROTIME in the last 168 hours. Cardiac Enzymes: No results for input(s): CKTOTAL, CKMB, CKMBINDEX, TROPONINI in the last 168 hours. BNP (last 3 results) No results for input(s): PROBNP in  the last 8760 hours. HbA1C: No results for input(s): HGBA1C in the last 72 hours. CBG: Recent Labs  Lab 08/18/19 1039  GLUCAP 128*   Lipid Profile: No results for input(s): CHOL, HDL, LDLCALC, TRIG, CHOLHDL, LDLDIRECT in the last 72 hours. Thyroid Function Tests: No results for input(s): TSH, T4TOTAL, FREET4, T3FREE, THYROIDAB in the last 72 hours. Anemia Panel: No results for input(s): VITAMINB12, FOLATE, FERRITIN, TIBC, IRON, RETICCTPCT in the last 72 hours. Sepsis Labs: Recent Labs  Lab 08/19/19 0444  PROCALCITON <0.10    Recent Results (from the past 240 hour(s))  SARS CORONAVIRUS 2 (TAT 6-24 HRS) Nasopharyngeal Nasopharyngeal Swab     Status: None   Collection Time: 08/13/19  8:16 PM   Specimen: Nasopharyngeal Swab  Result Value Ref Range Status   SARS Coronavirus 2 NEGATIVE NEGATIVE Final    Comment: (NOTE) SARS-CoV-2 target nucleic acids are NOT DETECTED. The SARS-CoV-2 RNA is generally detectable in upper and lower respiratory specimens during the acute phase of infection. Negative results do not preclude SARS-CoV-2 infection, do not rule out co-infections with other pathogens, and should not be used as the sole basis for treatment or other patient management decisions. Negative results must be combined with clinical observations, patient history, and epidemiological information. The expected result is Negative. Fact Sheet for Patients: SugarRoll.be Fact Sheet for Healthcare Providers: https://www.woods-mathews.com/ This test is not yet approved or cleared by the [place] FDA and  has been authorized for detection and/or diagnosis of SARS-CoV-2 by FDA under an Emergency Use Authorization (EUA). This EUA will remain  in effect (meaning this test can be used) for the duration of the COVID-19 declaration under Section 56 4(b)(1) of the Act, 21 U.S.C. section 360bbb-3(b)(1), unless the authorization is terminated  or revoked sooner. Performed at [facility], [place] [address] [place], [place] [address]   MRSA PCR Screening     Status: None   Collection Time: 08/13/19 11:28 PM   Specimen: Nasopharyngeal  Result Value Ref Range Status   MRSA by PCR NEGATIVE NEGATIVE Final    Comment:        The GeneXpert MRSA Assay (FDA approved for NASAL  specimens only), is one component of a comprehensive MRSA colonization surveillance program. It is not intended to diagnose MRSA infection nor to guide or monitor treatment for MRSA infections. Performed at [facility], [address] [place], [place] [address]   Respiratory Panel by RT PCR (Flu A&B, Covid) - Nasopharyngeal Swab     Status: None   Collection Time: 08/18/19  2:23 AM   Specimen: Nasopharyngeal Swab  Result Value Ref Range Status   SARS Coronavirus 2 by RT PCR NEGATIVE NEGATIVE Final    Comment: (NOTE) SARS-CoV-2 target nucleic acids are NOT DETECTED. The SARS-CoV-2 RNA is generally detectable in upper respiratoy specimens during the acute phase of infection. The lowest concentration of SARS-CoV-2 viral copies this assay can detect is 131 copies/mL. A negative result does not preclude SARS-Cov-2 infection and should not be used as the sole basis for treatment or other patient management decisions. A negative result may occur with  improper specimen collection/handling, submission of specimen other than nasopharyngeal swab, presence of viral mutation(s) within the areas targeted by this assay, and inadequate number of viral copies (<131 copies/mL). A negative result must be combined with clinical observations, patient history, and epidemiological information. The expected result is Negative. Fact Sheet for Patients:  PinkCheek.be Fact Sheet for Healthcare Providers:  GravelBags.it This test is not yet ap proved or cleared by the [place] FDA and  has been  authorized for detection and/or diagnosis of SARS-CoV-2 by FDA under an Emergency Use Authorization (EUA). This EUA will remain  in effect (meaning this test can be used) for the duration of the COVID-19 declaration under Section 564(b)(1) of the Act, 21 U.S.C. section 360bbb-3(b)(1), unless the authorization is terminated or revoked sooner.    Influenza A by PCR NEGATIVE NEGATIVE Final   Influenza B by PCR NEGATIVE NEGATIVE Final    Comment: (NOTE) The Xpert Xpress SARS-CoV-2/FLU/RSV assay is intended as an aid in  the diagnosis of influenza from Nasopharyngeal swab specimens and  should not be used as a sole basis for treatment. Nasal washings and  aspirates are unacceptable for Xpert Xpress SARS-CoV-2/FLU/RSV  testing. Fact Sheet for Patients: PinkCheek.be Fact Sheet for Healthcare Providers: GravelBags.it This test is not yet approved or cleared by the [place] FDA and  has been authorized for detection and/or diagnosis of SARS-CoV-2 by  FDA under an Emergency Use Authorization (EUA). This EUA will remain  in effect (meaning this test can be used) for the duration of the  Covid-19 declaration under Section 564(b)(1) of the Act, 21  U.S.C. section 360bbb-3(b)(1), unless the authorization is  terminated or revoked. Performed at [facility], [address] [place], [place] [address]   CULTURE, BLOOD (ROUTINE X 2) w Reflex to ID Panel     Status: None   Collection Time: 08/18/19  8:19 AM   Specimen: BLOOD  Result Value Ref Range Status   Specimen Description BLOOD LEFT HAND   Final   Special Requests   Final    BOTTLES DRAWN AEROBIC AND ANAEROBIC Blood Culture adequate volume   Culture   Final    NO GROWTH 5 DAYS Performed at [facility], [address] [place], [place] [address]    Report Status 08/23/2019 FINAL  Final  CULTURE, BLOOD (ROUTINE X 2) w Reflex to ID Panel     Status: None    Collection Time: 08/18/19  8:24 AM   Specimen: BLOOD  Result Value Ref Range Status   Specimen Description  BLOOD LEFT WRIST  Final   Special Requests   Final    BOTTLES DRAWN AEROBIC AND ANAEROBIC Blood Culture adequate volume   Culture   Final    NO GROWTH 5 DAYS Performed at [facility], [address] [place], [place] [address]    Report Status 08/23/2019 FINAL  Final  Aspergillus Ag, BAL/Serum     Status: None   Collection Time: 08/20/19  6:24 AM   Specimen: Vein  Result Value Ref Range Status   Aspergillus Ag, BAL/Serum 0.03 0.00 - 0.49 Index Final    Comment: (NOTE) Performed At: [facility] [address] [place], [place] [id number] Rush Farmer MD AL:[id number] Performed At: [facility] [address] [place], [place] [id number] Katina Degree MDPhD AS:[id number]      Radiology Studies: CTA chest: CLINICAL DATA:  Hemoptysis, history of breast cancer and COVID  EXAM: CT ANGIOGRAPHY CHEST WITH CONTRAST  TECHNIQUE: Multidetector CT imaging of the chest was performed using the standard protocol during bolus administration of intravenous contrast. Multiplanar CT image reconstructions and MIPs were obtained to evaluate the vascular anatomy.  CONTRAST:  109m OMNIPAQUE IOHEXOL 350 MG/ML SOLN  COMPARISON:  July 09, 2019  FINDINGS: Cardiovascular: There is a optimal opacification of the pulmonary arteries. There is no central,segmental, or subsegmental filling defects within the pulmonary arteries. A left-sided MediPort catheter is seen with the tip in the right atrium. The heart is normal in size. No pericardial effusion or thickening. No evidence right heart strain. There is normal three-vessel brachiocephalic anatomy without proximal stenosis. Scattered mild aortic atherosclerosis.  Mediastinum/Nodes: No hilar, mediastinal, or axillary adenopathy. Thyroid gland, trachea, and esophagus demonstrate no  significant findings.  Lungs/Pleura: There is new/worsening multifocal patchy predominantly peripherally based ground-glass opacities seen throughout both lungs. There is also slight interval worsening in the tree-in-bud nodular hazy opacities within the left lung apex. There is unchanged spiculated masslike consolidation within the right infrahilar region, likely from post treatment changes/fibrosis. There is a trace right pleural effusion present.  Upper Abdomen: No acute abnormalities present in the visualized portions of the upper abdomen.  Musculoskeletal: No chest wall abnormality. No acute or significant osseous findings. Again noted is diffuse skin thickening seen along the right breast. There is a surgical clips within the right breast.  Review of the MIP images confirms the above findings.  IMPRESSION: 1. No central, segmental, or subsegmental pulmonary embolism. 2. Interval worsening of the multifocal patchy ground-glass opacities throughout both lungs, consistent with multifocal pneumonia. 3. Interval slight worsening in the tree-in-bud nodular opacities within the left upper lobe, likely due to atypical infectious etiology, however cannot exclude underlying metastatic disease. 4. Post treatment radiation/fibrotic changes in the right infrahilar region. 5. Trace right pleural effusion   Electronically Signed   By: BPrudencio PairM.D.   On: 08/06/2019 00:17  Scheduled Meds: . aspirin EC  81 mg Oral Daily  . budesonide (PULMICORT) nebulizer solution  0.5 mg Nebulization BID  . Chlorhexidine Gluconate Cloth  6 each Topical Daily  . cholecalciferol  1,000 Units Oral QHS  . diltiazem  240 mg Oral Daily  . enoxaparin (LOVENOX) injection  40 mg Subcutaneous Q24H  . ferrous sulfate  325 mg Oral QHS  . furosemide  20 mg Oral BID  . ipratropium  0.5 mg Nebulization Q6H  . ipratropium-albuterol  3 mL Nebulization Once  . levalbuterol  0.63 mg Nebulization Q6H  .  methylPREDNISolone (SOLU-MEDROL) injection  60 mg Intravenous Q4H  .  multivitamin with minerals  1 tablet Oral QHS  . omega-3 acid ethyl esters  1 g Oral Daily  . pantoprazole  20 mg Oral Daily  . polyethylene glycol  17 g Oral Daily  . potassium chloride  20 mEq Oral Daily  . sodium chloride flush  3 mL Intravenous Q12H  . sulfamethoxazole-trimethoprim  1 tablet Oral Once per day on Mon Wed Fri   Continuous Infusions: . sodium chloride 250 mL (08/22/19 0655)  . azithromycin 500 mg (08/23/19 0853)  . cefTRIAXone (ROCEPHIN)  IV 2 g (08/23/19 0809)     LOS: 5 days    Time spent: over 10 min Para Skeans, MD [facility] To contact the attending provider between 7A-7P or the covering provider during after hours 7P-7A, please log into the web site [email or web address] and access using universal [place] password for that web site. If you do not have the password, please call the hospital operator.  08/23/2019, 5:52 PM

## 2019-08-24 LAB — CBC WITH DIFFERENTIAL/PLATELET
Abs Immature Granulocytes: 0.14 10*3/uL — ABNORMAL HIGH (ref 0.00–0.07)
Basophils Absolute: 0 10*3/uL (ref 0.0–0.1)
Basophils Relative: 0 %
Eosinophils Absolute: 0 10*3/uL (ref 0.0–0.5)
Eosinophils Relative: 0 %
HCT: 36.8 % (ref 36.0–46.0)
Hemoglobin: 11.9 g/dL — ABNORMAL LOW (ref 12.0–15.0)
Immature Granulocytes: 1 %
Lymphocytes Relative: 2 %
Lymphs Abs: 0.2 10*3/uL — ABNORMAL LOW (ref 0.7–4.0)
MCH: 29 pg (ref 26.0–34.0)
MCHC: 32.3 g/dL (ref 30.0–36.0)
MCV: 89.8 fL (ref 80.0–100.0)
Monocytes Absolute: 0.4 10*3/uL (ref 0.1–1.0)
Monocytes Relative: 4 %
Neutro Abs: 9.5 10*3/uL — ABNORMAL HIGH (ref 1.7–7.7)
Neutrophils Relative %: 93 %
Platelets: 241 10*3/uL (ref 150–400)
RBC: 4.1 MIL/uL (ref 3.87–5.11)
RDW: 14.9 % (ref 11.5–15.5)
WBC: 10.3 10*3/uL (ref 4.0–10.5)
nRBC: 0 % (ref 0.0–0.2)

## 2019-08-24 LAB — COMPREHENSIVE METABOLIC PANEL
ALT: 25 U/L (ref 0–44)
AST: 21 U/L (ref 15–41)
Albumin: 3.1 g/dL — ABNORMAL LOW (ref 3.5–5.0)
Alkaline Phosphatase: 72 U/L (ref 38–126)
Anion gap: 15 (ref 5–15)
BUN: 26 mg/dL — ABNORMAL HIGH (ref 6–20)
CO2: 30 mmol/L (ref 22–32)
Calcium: 10.2 mg/dL (ref 8.9–10.3)
Chloride: 92 mmol/L — ABNORMAL LOW (ref 98–111)
Creatinine, Ser: 0.8 mg/dL (ref 0.44–1.00)
GFR calc Af Amer: >60 mL/min (ref >60)
GFR calc non Af Amer: >60 mL/min (ref >60)
Glucose, Bld: 145 mg/dL — ABNORMAL HIGH (ref 70–99)
Potassium: 3.4 mmol/L — ABNORMAL LOW (ref 3.5–5.1)
Sodium: 137 mmol/L (ref 135–145)
Total Bilirubin: 0.8 mg/dL (ref 0.3–1.2)
Total Protein: 5.5 g/dL — ABNORMAL LOW (ref 6.5–8.1)

## 2019-08-24 LAB — MAGNESIUM: Magnesium: 2.2 mg/dL (ref 1.7–2.4)

## 2019-08-24 MED ORDER — AZITHROMYCIN 500 MG PO TABS
500.0000 mg | ORAL_TABLET | Freq: Once | ORAL | Status: AC
  Administered 2019-08-24: 500 mg via ORAL
  Filled 2019-08-24: qty 1

## 2019-08-24 MED ORDER — PREDNISONE 10 MG (21) PO TBPK: 10.0000 mg | ORAL_TABLET | Freq: Three times a day (TID) | ORAL | Status: DC

## 2019-08-24 MED ORDER — PREDNISONE 10 MG (21) PO TBPK: 20.0000 mg | ORAL_TABLET | Freq: Every evening | ORAL | Status: DC

## 2019-08-24 MED ORDER — PREDNISONE 10 MG (21) PO TBPK
20.0000 mg | ORAL_TABLET | Freq: Every morning | ORAL | Status: DC
  Filled 2019-08-24: qty 21

## 2019-08-24 MED ORDER — BISACODYL 10 MG RE SUPP
10.0000 mg | Freq: Once | RECTAL | Status: AC
  Administered 2019-08-24: 10 mg via RECTAL
  Filled 2019-08-24: qty 1

## 2019-08-24 MED ORDER — PREDNISONE 10 MG (21) PO TBPK: 10.0000 mg | ORAL_TABLET | ORAL | Status: DC

## 2019-08-24 MED ORDER — DILTIAZEM HCL ER COATED BEADS 180 MG PO CP24
180.0000 mg | ORAL_CAPSULE | Freq: Every day | ORAL | Status: DC
  Filled 2019-08-24: qty 1

## 2019-08-24 MED ORDER — DOCUSATE SODIUM 100 MG PO CAPS
100.0000 mg | ORAL_CAPSULE | Freq: Once | ORAL | Status: AC
  Administered 2019-08-24: 100 mg via ORAL
  Filled 2019-08-24: qty 1

## 2019-08-24 MED ORDER — PREDNISONE 10 MG (21) PO TBPK: 10.0000 mg | ORAL_TABLET | Freq: Four times a day (QID) | ORAL | Status: DC

## 2019-08-24 MED ORDER — PREDNISONE 50 MG PO TABS
60.0000 mg | ORAL_TABLET | Freq: Once | ORAL | Status: AC
  Administered 2019-08-24: 60 mg via ORAL
  Filled 2019-08-24: qty 1

## 2019-08-24 NOTE — Progress Notes (Signed)
PROGRESS NOTE    Ashley Molina  IFO:[id number] DOB: 1960-11-09 DOA: 08/17/2019 PCP: Patient, No Pcp Per   Brief Narrative:  Ashley Molina  is a 59 y.o. Caucasian female with a known history of breast cancer with lung metastasis status post lumpectomy, chemotherapy and radiotherapy, COVID-19 in December 2020, dyslipidemia and anemia, who presented to [facility] worsening dyspnea with associated cough with inability to expectorate as well as wheezing today.  The patient was just discharged from here on 3/10 after being treated for acute on chronic respiratory failure requiring BiPAP.  No nausea or vomiting or abdominal pain.  She denies any chest pain or palpitations.  No fever or chills.  No dysuria, oliguria or hematuria or flank pain.  Upon presentation to the emergency room, heart rate was 111 has gone up to 142 with otherwise normal vital signs.  EKG showed atrial fibrillation/flutter with a rate of 165.  Respiratory clear unless 25-28.  Labs revealed potassium of 3.6 magnesium 2.2 anion gap of 15.  BNP was 400.  High-sensitivity troponin I was 38 and later 98 and CBC showed leukocytosis of 13.9.  Influenza antigens and COVID-19 PCR came back negative.  Chest x-ray showed findings representing pulmonary edema with pneumonia not excluded.  She's currently admitted for COPD exacerbation and is being treated with lasix for pulmonary edema/heart failure as well.   Assessment & Plan:   Active Problems:   COPD exacerbation ([place])   Palliative care encounter  1.  Acute Hypoxic Respiratory Failure  COPD acute exacerbation with subsequent acute cor pulmonale with mild interstitial pulmonary edema. - CXR 3/15 with edema - Steroids, scheduled and prn nebs, pulmicort - lasix changed to po 20 mg bid. -She will be placed on scheduled and as needed duo nebs. 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  Hx of covid and radiation induced pneumonitis as well.  Multiple possible contributing factors.  Will c/s pulm to follow. -pt is on oxygen via 4 l [place] currently and her saturation are high 90"s and exam is CLTABL. 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 D/w her about this result and she is calm and sad. I have given her morphine which has alleviated  her sob and anxiety.    2. Sinus Tachycardia with PAC vs Fib/Flutter  Elevated Troponin - some question of possible afib/flutter, though suspect this is sinus tach with PAC's - will consult cardiology with elevated troponin and tachyarrythmia, suspect this is demand ischemia - recent echo with normal EF -continue diltiazem, however dose cut down to 180 as bp has been low probably from diuretic and morphine.  3.  GERD. -PPI therapy continued  4.  Hypertension. -continued on diltiazem. -BP soft due to additional iv diuretic therapy   5.  DVT prophylaxis. -Subcutaneous Lovenox  6.Hypokalemia: pt given 20 meq of kcl iv x1.   DVT prophylaxis: lovenox Code Status: full Family Communication: none at bedside Disposition Plan:  . Patient came from: home         . Anticipated d/c place: home with hospice on Monday. . Barriers to d/c OR conditions which need to be met to effect a safe d/c: pending improvement in resp status . Anticipate d/c in am.  Consultants:   Oncology  Intensivist.  Cardiology Procedures:   none  Antimicrobials:  Anti-infectives (From admission, onward)   Start     Dose/Rate Route Frequency Ordered Stop   08/24/19 0830  azithromycin (ZITHROMAX) tablet 500 mg     500 mg Oral  Once 08/24/19 0829 08/24/19 0953   08/18/19 0900   sulfamethoxazole-trimethoprim (BACTRIM DS) 800-160 MG per tablet 1 tablet     1 tablet Oral Once per day on Mon Wed Fri 08/18/19 0622     08/18/19 0700  cefTRIAXone (ROCEPHIN) 2 g in sodium chloride 0.9 % 100 mL IVPB  Status:  Discontinued     2 g 200 mL/hr over 30 Minutes Intravenous Every 24 hours 08/18/19 0647 08/24/19 0819   08/18/19 0700  azithromycin (ZITHROMAX) 500 mg in sodium chloride 0.9 % 250 mL IVPB  Status:  Discontinued     500 mg 250 mL/hr over 60 Minutes Intravenous Every 24 hours 08/18/19 0647 08/24/19 0829     Subjective: SOB improved today.  08/20/2019: Blood pressure 117/66, pulse 96, temperature 97.9 F (36.6 C), temperature source Oral, resp. rate 16, height _0  (1.6 m), weight 86.2 kg, last menstrual period 04/04/2012, SpO2 94 %.  Pt is alert awake and coughing after taking deep breaths. She has IS at bedside.  She Denies any other complaints.   08/21/19: Pt is alert and sitting on bedside recliner. She is on 4 Lnc and is comfortable with little sob / cough. Labs today show hypokalemia due to diuretic therapy, and wbc count of 12.9 down from 14/7.  3/19 Today pt was sob upon my arrival to her room and has to be restarted on her steroid therapy , Morphine given to pt for sob and both alleviated her sob , later on checking on pt she was sitting on recliner and  Stable, says she is breathing better d/w her results of her ct and she is calm and sad.   3/20 Patient today is in good spirits with daughter at bedside, is relieved and content that she can finally make a decision For her ongoing care.She has decided with home hospice.   3/21 Pt is alert and awake she has been SOB, her potassium is stable.    Objective: Vitals:   08/23/19 2116 08/24/19 0029 08/24/19 0747 08/24/19 0755  BP: 110/70 123/82  117/66  Pulse: 91 91  96  Resp: _1 Temp: 97.8 F (36.6 C) 98.4 F (36.9 C)  97.9 F (36.6 C)  TempSrc: Oral   Oral  SpO2: 94% 95% 94% 94%    Weight:      Height:        Intake/Output Summary (Last 24 hours) at 08/24/2019 1215 Last data filed at 08/24/2019 0516 Gross per 24 hour  Intake 3 ml  Output 850 ml  Net -847 ml   Filed Weights   08/20/19 0500 08/21/19 0424 08/22/19 0644  Weight: 92.5 kg 92.3 kg 86.2 kg    Examination: Blood pressure 117/66, pulse 96, temperature 97.9 F (36.6 C), temperature source Oral, resp. rate 16, height _2  (1.6 m), weight 86.2 kg, last menstrual period 04/04/2012, SpO2 94 %. General: No acute distress. Cardiovascular: Heart sounds show a tachycardic rate Lungs: mild wheezing bl on inspiration / expiration . Abdomen: Soft, nontender, nondistended Neurological: Alert and oriented 3. Moves all extremities 4. Cranial nerves II through XII grossly intact. Skin: Warm and dry. No rashes or lesions. Extremities: No clubbing or cyanosis.  Data Reviewed:  CBC: Recent Labs  Lab 08/20/19 0624 08/21/19 1311 08/22/19 0251 08/23/19 0441 08/24/19 0443  WBC 12.9* 17.6* 10.3 6.4 10.3  NEUTROABS 12.0* 15.2* 9.1* 5.9 9.5*  HGB 12.1 13.0 11.9* 11.9* 11.9*  HCT 36.8 39.8 37.5 37.2 36.8  MCV 89.5 89.0 89.9 89.9 89.8  PLT 188 224 204 207 010   Basic Metabolic Panel: Recent Labs  Lab 08/19/19 0444 08/20/19 0624 08/22/19 0251 08/23/19 0441 08/24/19 0443  NA 141 138 135 134* 137  K 3.9 3.2* 3.3* 3.7 3.4*  CL 96* 93* 91* 79* 92*  CO2 31 32 _0 GLUCOSE 142* 140* 135* 130* 145*  BUN 32* 34* 33* 24* 26*  CREATININE 0.81 0.72 0.70 0.59 0.80  CALCIUM 10.1 9.7 9.8 8.4* 10.2  MG 2.4 2.3 2.4 2.1 2.2  PHOS 4.3 3.9 3.1  --   --    GFR: Estimated Creatinine Clearance: 77.8 mL/min (by C-G formula based on SCr of 0.8 mg/dL). Liver Function Tests: Recent Labs  Lab 08/19/19 0444 08/20/19 0624 08/22/19 0251 08/23/19 0441 08/24/19 0443  AST _1 ALT _2 ALKPHOS 88 80 80 64 72  BILITOT 0.8 1.0 0.8 0.7 0.8  PROT 6.3* 6.0* 6.0* 5.1* 5.5*  ALBUMIN 3.4* 3.3* 3.3*  2.8* 3.1*   No results for input(s): LIPASE, AMYLASE in the last 168 hours. No results for input(s): AMMONIA in the last 168 hours. Coagulation Profile: No results for input(s): INR, PROTIME in the last 168 hours. Cardiac Enzymes: No results for input(s): CKTOTAL, CKMB, CKMBINDEX, TROPONINI in the last 168 hours. BNP (last 3 results) No results for input(s): PROBNP in the last 8760 hours. HbA1C: No results for input(s): HGBA1C in the last 72 hours. CBG: Recent Labs  Lab 08/18/19 1039  GLUCAP 128*   Lipid Profile: No results for input(s): CHOL, HDL, LDLCALC, TRIG, CHOLHDL, LDLDIRECT in the last 72 hours. Thyroid Function Tests: No results for input(s): TSH, T4TOTAL, FREET4, T3FREE, THYROIDAB in the last 72 hours. Anemia Panel: No results for input(s): VITAMINB12, FOLATE, FERRITIN, TIBC, IRON, RETICCTPCT in the last 72 hours. Sepsis Labs: Recent Labs  Lab 08/19/19 0444  PROCALCITON <0.10    Recent Results (from the past 240 hour(s))  Respiratory Panel by RT PCR (Flu A&B, Covid) - Nasopharyngeal Swab     Status: None   Collection Time: 08/18/19  2:23 AM   Specimen: Nasopharyngeal Swab  Result Value Ref Range Status   SARS Coronavirus 2 by RT PCR NEGATIVE NEGATIVE Final    Comment: (NOTE) SARS-CoV-2 target nucleic acids are NOT DETECTED. The SARS-CoV-2 RNA is generally detectable in upper respiratoy specimens during the acute phase of infection. The lowest concentration of SARS-CoV-2 viral copies this assay can detect is 131 copies/mL. A negative result does not preclude SARS-Cov-2 infection and should not be used as the sole basis for treatment or other patient management decisions. A negative result may occur with  improper specimen collection/handling, submission of specimen other than nasopharyngeal swab, presence of viral mutation(s) within the areas targeted by this assay, and inadequate number of viral copies (<131 copies/mL). A negative result must be combined with  clinical observations, patient history, and epidemiological information. The expected result is Negative. Fact Sheet for Patients:  PinkCheek.be Fact Sheet for Healthcare Providers:  GravelBags.it This test is not yet ap proved or cleared by the [place] FDA and  has been authorized for detection and/or diagnosis of SARS-CoV-2 by FDA  under an Emergency Use Authorization (EUA). This EUA will remain  in effect (meaning this test can be used) for the duration of the COVID-19 declaration under Section 564(b)(1) of the Act, 21 U.S.C. section 360bbb-3(b)(1), unless the authorization is terminated or revoked sooner.    Influenza A by PCR NEGATIVE NEGATIVE Final   Influenza B by PCR NEGATIVE NEGATIVE Final    Comment: (NOTE) The Xpert Xpress SARS-CoV-2/FLU/RSV assay is intended as an aid in  the diagnosis of influenza from Nasopharyngeal swab specimens and  should not be used as a sole basis for treatment. Nasal washings and  aspirates are unacceptable for Xpert Xpress SARS-CoV-2/FLU/RSV  testing. Fact Sheet for Patients: PinkCheek.be Fact Sheet for Healthcare Providers: GravelBags.it This test is not yet approved or cleared by the [place] FDA and  has been authorized for detection and/or diagnosis of SARS-CoV-2 by  FDA under an Emergency Use Authorization (EUA). This EUA will remain  in effect (meaning this test can be used) for the duration of the  Covid-19 declaration under Section 564(b)(1) of the Act, 21  U.S.C. section 360bbb-3(b)(1), unless the authorization is  terminated or revoked. Performed at [facility], [address] [place], [place] [address]   CULTURE, BLOOD (ROUTINE X 2) w Reflex to ID Panel     Status: None   Collection Time: 08/18/19  8:19 AM   Specimen: BLOOD  Result Value Ref Range Status   Specimen Description BLOOD LEFT HAND    Final   Special Requests   Final    BOTTLES DRAWN AEROBIC AND ANAEROBIC Blood Culture adequate volume   Culture   Final    NO GROWTH 5 DAYS Performed at [facility], [address] [place], [place] [address]    Report Status 08/23/2019 FINAL  Final  CULTURE, BLOOD (ROUTINE X 2) w Reflex to ID Panel     Status: None   Collection Time: 08/18/19  8:24 AM   Specimen: BLOOD  Result Value Ref Range Status   Specimen Description BLOOD LEFT WRIST  Final   Special Requests   Final    BOTTLES DRAWN AEROBIC AND ANAEROBIC Blood Culture adequate volume   Culture   Final    NO GROWTH 5 DAYS Performed at [facility], [address] [place], [place] [address]    Report Status 08/23/2019 FINAL  Final  Aspergillus Ag, BAL/Serum     Status: None   Collection Time: 08/20/19  6:24 AM   Specimen: Vein  Result Value Ref Range Status   Aspergillus Ag, BAL/Serum 0.03 0.00 - 0.49 Index Final    Comment: (NOTE) Performed At: [facility] [address] [place], [place] [id number] Rush Farmer MD [id number] Performed At: [facility] [address] [place], [place] [id number] Katina Degree MDPhD [id number]      Radiology Studies: CTA chest: CLINICAL DATA:  Hemoptysis, history of breast cancer and COVID  EXAM: CT ANGIOGRAPHY CHEST WITH CONTRAST  TECHNIQUE: Multidetector CT imaging of the chest was performed using the standard protocol during bolus administration of intravenous contrast. Multiplanar CT image reconstructions and MIPs were obtained to evaluate the vascular anatomy.  CONTRAST:  28m OMNIPAQUE IOHEXOL 350 MG/ML SOLN  COMPARISON:  July 09, 2019  FINDINGS: Cardiovascular: There is a optimal opacification of the pulmonary arteries. There is no central,segmental, or subsegmental filling defects within the pulmonary arteries. A left-sided MediPort catheter is seen with the tip in the right atrium. The heart is normal in size. No  pericardial effusion or thickening. No evidence right heart strain. There is normal three-vessel brachiocephalic anatomy without proximal stenosis. Scattered mild aortic atherosclerosis.  Mediastinum/Nodes: No hilar, mediastinal, or axillary adenopathy. Thyroid gland, trachea, and esophagus demonstrate no significant findings.  Lungs/Pleura: There is new/worsening multifocal patchy predominantly peripherally based ground-glass opacities seen throughout both lungs. There is also slight interval worsening in the tree-in-bud nodular hazy opacities within the left lung apex. There is unchanged spiculated masslike consolidation within the right infrahilar region, likely from post treatment changes/fibrosis. There is a trace right pleural effusion present.  Upper Abdomen: No acute abnormalities present in the visualized portions of the upper abdomen.  Musculoskeletal: No chest wall abnormality. No acute or significant osseous findings. Again noted is diffuse skin thickening seen along the right breast. There is a surgical clips within the right breast.  Review of the MIP images confirms the above findings.  IMPRESSION: 1. No central, segmental, or subsegmental pulmonary embolism. 2. Interval worsening of the multifocal patchy ground-glass opacities throughout both lungs, consistent with multifocal pneumonia. 3. Interval slight worsening in the tree-in-bud nodular opacities within the left upper lobe, likely due to atypical infectious etiology, however cannot exclude underlying metastatic disease. 4. Post treatment radiation/fibrotic changes in the right infrahilar region. 5. Trace right pleural effusion  Electronically Signed   By: Prudencio Pair M.D.   On: 08/06/2019 00:17  Scheduled Meds: . aspirin EC  81 mg Oral Daily  . bisacodyl  10 mg Rectal Once  . budesonide (PULMICORT) nebulizer solution  0.5 mg Nebulization BID  . Chlorhexidine Gluconate Cloth  6 each Topical  Daily  . cholecalciferol  1,000 Units Oral QHS  . [START ON 08/25/2019] diltiazem  180 mg Oral QHS  . docusate sodium  100 mg Oral Once  . enoxaparin (LOVENOX) injection  40 mg Subcutaneous Q24H  . ferrous sulfate  325 mg Oral QHS  . furosemide  20 mg Oral BID  . ipratropium  0.5 mg Nebulization Q6H  . ipratropium-albuterol  3 mL Nebulization Once  . levalbuterol  0.63 mg Nebulization Q6H  . multivitamin with minerals  1 tablet Oral QHS  . omega-3 acid ethyl esters  1 g Oral Daily  . pantoprazole  20 mg Oral Daily  . polyethylene glycol  17 g Oral Daily  . potassium chloride  20 mEq Oral Daily  . predniSONE  10 mg Oral PC lunch  . predniSONE  10 mg Oral PC supper  . [START ON 08/25/2019] predniSONE  10 mg Oral 3 x daily with food  . [START ON 08/26/2019] predniSONE  10 mg Oral 4X daily taper  . predniSONE  20 mg Oral AC breakfast  . predniSONE  20 mg Oral Nightly  . [START ON 08/25/2019] predniSONE  20 mg Oral Nightly  . sodium chloride flush  3 mL Intravenous Q12H  . sulfamethoxazole-trimethoprim  1 tablet Oral Once per day on Mon Wed Fri   Continuous Infusions: . sodium chloride 250 mL (08/22/19 0655)     LOS: 6 days    Time spent: over 68 min Para Skeans, MD [facility] To contact the attending provider between 7A-7P or the covering provider during after hours 7P-7A, please log into the web site [email or web address] and access using universal [place] password for that web site. If you do not have the password, please call the hospital operator.  08/24/2019, 12:15 PM

## 2019-08-24 NOTE — Plan of Care (Signed)

## 2019-08-25 ENCOUNTER — Other Ambulatory Visit: Payer: Self-pay | Admitting: Hospice and Palliative Medicine

## 2019-08-25 ENCOUNTER — Telehealth: Payer: Self-pay | Admitting: *Deleted

## 2019-08-25 LAB — COMPREHENSIVE METABOLIC PANEL
ALT: 25 U/L (ref 0–44)
AST: 24 U/L (ref 15–41)
Albumin: 3.1 g/dL — ABNORMAL LOW (ref 3.5–5.0)
Alkaline Phosphatase: 72 U/L (ref 38–126)
Anion gap: 11 (ref 5–15)
BUN: 29 mg/dL — ABNORMAL HIGH (ref 6–20)
CO2: 30 mmol/L (ref 22–32)
Calcium: 10.1 mg/dL (ref 8.9–10.3)
Chloride: 94 mmol/L — ABNORMAL LOW (ref 98–111)
Creatinine, Ser: 0.77 mg/dL (ref 0.44–1.00)
GFR calc Af Amer: >60 mL/min (ref >60)
GFR calc non Af Amer: >60 mL/min (ref >60)
Glucose, Bld: 138 mg/dL — ABNORMAL HIGH (ref 70–99)
Potassium: 3.8 mmol/L (ref 3.5–5.1)
Sodium: 135 mmol/L (ref 135–145)
Total Bilirubin: 0.7 mg/dL (ref 0.3–1.2)
Total Protein: 5.5 g/dL — ABNORMAL LOW (ref 6.5–8.1)

## 2019-08-25 LAB — MAGNESIUM: Magnesium: 2.4 mg/dL (ref 1.7–2.4)

## 2019-08-25 MED ORDER — PREDNISONE 10 MG PO TABS: 5.0000 mg | ORAL_TABLET | Freq: Every day | ORAL | Status: DC

## 2019-08-25 MED ORDER — PREDNISONE 20 MG PO TABS: 20.0000 mg | ORAL_TABLET | Freq: Every day | ORAL | 0 refills | Status: AC

## 2019-08-25 MED ORDER — PREDNISONE 20 MG PO TABS: 20.0000 mg | ORAL_TABLET | Freq: Every day | ORAL | Status: DC

## 2019-08-25 MED ORDER — LEVALBUTEROL HCL 0.63 MG/3ML IN NEBU: 0.6300 mg | INHALATION_SOLUTION | Freq: Four times a day (QID) | RESPIRATORY_TRACT | 12 refills | Status: AC

## 2019-08-25 MED ORDER — PREDNISONE 10 MG PO TABS: 10.0000 mg | ORAL_TABLET | Freq: Every day | ORAL | Status: DC

## 2019-08-25 MED ORDER — PREDNISONE 20 MG PO TABS: 40.0000 mg | ORAL_TABLET | Freq: Every day | ORAL | Status: DC

## 2019-08-25 MED ORDER — PREDNISONE 10 MG PO TABS: 10.0000 mg | ORAL_TABLET | Freq: Every day | ORAL | 0 refills | Status: AC

## 2019-08-25 MED ORDER — MORPHINE SULFATE (CONCENTRATE) 20 MG/ML PO SOLN: 5.0000 mg | ORAL | 0 refills | Status: AC | PRN

## 2019-08-25 MED ORDER — PREDNISONE 10 MG PO TABS: 30.0000 mg | ORAL_TABLET | Freq: Every day | ORAL | 0 refills | Status: AC

## 2019-08-25 MED ORDER — PREDNISONE 20 MG PO TABS: 40.0000 mg | ORAL_TABLET | Freq: Every day | ORAL | Status: AC

## 2019-08-25 MED ORDER — PREDNISONE 5 MG PO TABS: 5.0000 mg | ORAL_TABLET | Freq: Every day | ORAL | 0 refills | Status: AC

## 2019-08-25 MED ORDER — FUROSEMIDE 20 MG PO TABS: 20.0000 mg | ORAL_TABLET | Freq: Every day | ORAL | 0 refills | Status: AC

## 2019-08-25 MED ORDER — PREDNISONE 20 MG PO TABS: 30.0000 mg | ORAL_TABLET | Freq: Every day | ORAL | Status: DC

## 2019-08-25 MED ORDER — PREDNISONE 50 MG PO TABS
50.0000 mg | ORAL_TABLET | Freq: Once | ORAL | Status: AC
  Administered 2019-08-25: 50 mg via ORAL
  Filled 2019-08-25: qty 1

## 2019-08-25 MED ORDER — DILTIAZEM HCL ER COATED BEADS 240 MG PO CP24: 180.0000 mg | ORAL_CAPSULE | Freq: Every day | ORAL | 0 refills | Status: AC

## 2019-08-25 MED ORDER — LABETALOL HCL 5 MG/ML IV SOLN
2.5000 mg | Freq: Once | INTRAVENOUS | Status: AC
  Administered 2019-08-25: 11:00:00 2.5 mg via INTRAVENOUS
  Filled 2019-08-25: qty 4

## 2019-08-25 NOTE — Progress Notes (Signed)
Physical Therapy Treatment Patient Details Name: Ashley Molina MRN: [id number] DOB: 05-Nov-1960 Today's Date: 08/25/2019    History of Present Illness Pt is a 59 y.o. Caucasian female with a known history of breast cancer with lung metastasis status post lumpectomy, chemotherapy and radiotherapy, COVID-19 in December 2020, dyslipidemia and anemia, who presented to [facility] worsening dyspnea with associated cough with inability to expectorate as well as wheezing today.  The patient was just discharged from here on 3/10 after being treated for acute on chronic respiratory failure requiring BiPAP. elevated troponin per cardiology due to demand ischemia.    PT Comments    Pt in recliner, ready for session.  Plan for discharge home with Hospice today.  Pt on 3.5 LPM at rest.  sats 92%.  Stood with poor technique initially to RW needing verbal cues for safety.  Once standing she does c/o some dizziness 5/10.  Relieved with time but did not fully subside.  She was able to stand 2 attempts at chairside for several minutes each time and complete some marches and SLR with walker for support.  O2 decreasing to 85-87% at times with encouragement for deep breathing.    Discussed at length with pt and daughter discharge plan.  She wishes to go home in her car with family.  Education given regarding safety with gait given dizziness and decreasing O2 sats with mobility this session and unable to progress to gait.  Encouraged pt to consider EMS transfer home for general safety and overall ability to tolerate activity to get into home.  At this time, they still want to go home in her car.  Discussed strategies to make transition easier.  They stated they do not have a wheelchair but will use an office chair if needed.  Recommended wheelchair but they were resistant.  Discussed with Santiago Glad from [facility] and RN regarding mobility and concerns.    Patient suffers from breast cancer and respiratory failure  which impairs his/her ability to perform daily activities like toileting, feeding, dressing, grooming, bathing in the home. A cane, walker, crutch will not resolve the patient's issue with performing activities of daily living. A lightweight wheelchair is required/recommended and will allow patient to safely perform daily activities.   Patient can safely propel the wheelchair in the home or has a caregiver who can provide assistance.    Follow Up Recommendations  Home health PT;Supervision for mobility/OOB     Equipment Recommendations  Rolling walker with 5" wheels;Wheelchair (measurements PT);3in1 (PT)    Recommendations for Other Services       Precautions / Restrictions Precautions Precautions: Fall Precaution Comments: watch O2 Restrictions Weight Bearing Restrictions: No    Mobility  Bed Mobility               General bed mobility comments: pt up in recliner at start of session  Transfers Overall transfer level: Needs assistance Equipment used: Rolling walker (2 wheeled) Transfers: Sit to/from Stand Sit to Stand: Min assist            Ambulation/Gait         Gait velocity: decreased   General Gait Details: deferred due to SOB   Stairs             Wheelchair Mobility    Modified Rankin (Stroke Patients Only)       Balance Overall balance assessment: Needs assistance Sitting-balance support: Feet supported Sitting balance-Leahy Scale: Good     Standing balance support: Bilateral upper extremity  supported Standing balance-Leahy Scale: Fair Standing balance comment: on eepisode of LE's buckling with min a x 1 to recover.                            Cognition Arousal/Alertness: Awake/alert Behavior During Therapy: WFL for tasks assessed/performed Overall Cognitive Status: Within Functional Limits for tasks assessed                                        Exercises Other Exercises Other Exercises:  standing trials x 2 for several minutes each attempt.  marching in place and SLR with walker.    General Comments        Pertinent Vitals/Pain Pain Assessment: No/denies pain    Home Living                      Prior Function            PT Goals (current goals can now be found in the care plan section) Progress towards PT goals: Not progressing toward goals - comment    Frequency    Min 2X/week      PT Plan Current plan remains appropriate    Co-evaluation              AM-PAC PT "6 Clicks" Mobility   Outcome Measure  Help needed turning from your back to your side while in a flat bed without using bedrails?: None Help needed moving from lying on your back to sitting on the side of a flat bed without using bedrails?: None Help needed moving to and from a bed to a chair (including a wheelchair)?: A Little Help needed standing up from a chair using your arms (e.g., wheelchair or bedside chair)?: A Little Help needed to walk in hospital room?: A Little Help needed climbing 3-5 steps with a railing? : A Little 6 Click Score: 20    End of Session Equipment Utilized During Treatment: Gait belt;Oxygen Activity Tolerance: Patient limited by fatigue;Treatment limited secondary to medical complications (Comment) Patient left: in chair;with call bell/phone within reach;with family/visitor present Nurse Communication: Mobility status       Time: [id number] PT Time Calculation (min) (ACUTE ONLY): 28 min  Charges:  $Therapeutic Exercise: 8-22 mins $Therapeutic Activity: 8-22 mins                    Chesley Noon, PTA 08/25/19, 12:57 PM

## 2019-08-25 NOTE — Progress Notes (Signed)
Rx sent to new [facility] as the other location did not have in stock.

## 2019-08-25 NOTE — Telephone Encounter (Signed)
VERBAL ORDER called to [place] at hospice

## 2019-08-25 NOTE — Progress Notes (Signed)
Patient discharged per MD order. All discharge instructions given and all questions answered. EMS called for transport.

## 2019-08-25 NOTE — TOC Transition Note (Addendum)
Transition of Care [facility]) - CM/SW Discharge Note   Patient Details  Name: Ashley Molina MRN: [id number] Date of Birth: 10/25/1960  [facility] of [facility]) CM/SW Contact:  Shade Flood, LCSW Phone Number: 08/25/2019, 10:23 AM   Clinical Narrative:     Pt stable for dc. Spoke with Santiago Glad from [facility] who states that the DME will be to pt's home by noon and then pt's husband will bring portable O2 tank with him to come get pt for dc. Hospice will admit pt to their services this afternoon once pt is home.  Updated MD and RN.  There are no other TOC needs for dc.  12:27 Updated by Santiago Glad at [facility] that pt will need EMS transport home due to symptoms when ambulating. Have completed forms and started a packet for dc. Updated RN.  Final next level of care: Home w Hospice Care Barriers to Discharge: Barriers Resolved   Patient Goals and CMS Choice        Discharge Placement                       Discharge Plan and Services                                     Social Determinants of Health (SDOH) Interventions     Readmission Risk Interventions Readmission Risk Prevention Plan 08/19/2019 08/15/2019  Transportation Screening Complete Complete  PCP or Specialist Appt within 3-5 Days Patient refused Patient refused  [place] or Home Care Consult Complete Complete  Medication Review (RN Care Manager) Complete Complete  Some recent data might be hidden

## 2019-08-25 NOTE — Discharge Summary (Signed)
Physician Discharge Summary  TANGI SHROFF YTK:[id number] DOB: 05-14-1961 DOA: 08/17/2019  PCP: Patient, No Pcp Per  Admit date: 08/17/2019 Discharge date: 08/25/2019  Time spent: 30 minutes   Discharge Diagnoses:  Acute on chronic respiratory failure due to lung metastatic from her breast cancer .   Discharge Condition:  Fair  Diet recommendation: As tolerated   Filed Weights   08/20/19 0500 08/21/19 0424 08/22/19 0644  Weight: 92.5 kg 92.3 kg 86.2 kg    History of present illness:  Ashley Molina  is a 59 y.o. Caucasian female with a known history of breast cancer with lung metastasis status post lumpectomy, chemotherapy and radiotherapy, COVID-19 in December 2020, dyslipidemia and anemia, who presented to [facility] worsening dyspnea with associated cough with inability to expectorate as well as wheezing today.  The patient was just discharged from here on 3/10 after being treated for acute on chronic respiratory failure requiring BiPAP.  No nausea or vomiting or abdominal pain.  She denies any chest pain or palpitations.  No fever or chills.  No dysuria, oliguria or hematuria or flank pain.  Upon presentation to the emergency room, heart rate was 111 has gone up to 142 with otherwise normal vital signs.  EKG showed atrial fibrillation/flutter with a rate of 165.  Respiratory clear unless 25-28.  Labs revealed potassium of 3.6 magnesium 2.2 anion gap of 15.  BNP was 400.  High-sensitivity troponin I was 38 and later 98 and CBC showed leukocytosis of 13.9.  Influenza antigens and COVID-19 PCR came back negative.  Chest x-ray showed findings representing pulmonary edema with pneumonia not excluded.  The patient was given 40 mg of IV Lasix and 2 duo nebs as well as 125 mg of IV Solu-Medrol.  She will be admitted to an observation medically monitored bed for further evaluation and management.  Hospital Course:  Pt was managed on antibiotics, steroids,diuretic therapy ,  nebulizer and supplemental oxygen and prn morphine.PT is on bactrim for pcp prophylaxis Which we will continue. She has sinus tachycardia and positive  Troponin cardiology was consulted and pt was switched to Cardizem from metoprolol , and also started on diuretics, echo showed preserved EF.Pulmonology was also consulted.pt started on my service from march 17th and d/c plan was home hospice and d/c delay for insurance reasons. PT was  consulted and also palliative care along with oncology . Pt was doing well but on the 19 she started ot decline and  Has been sob and with continuous oxygen with increased o2 demand . Pts' meds were changed due to low BP and today her heart rate has gone up and stat iv labetalol was given which helped her , we will d/c pt on home dose of diltiazem and plan is for her to go home with hospice due to her poor prognosis.   Consultations:  Pulmonology- Dr.aleaskeraov.  Oncology Dr.rao.  Cardiology Dr. Clayborn Bigness.   PT/OT/CM  Discharge Exam: Vitals:   08/25/19 0724 08/25/19 0740  BP: 110/72   Pulse: (!) 105   Resp: 17   Temp: 97.7 F (36.5 C)   SpO2: 95% 93%   General: No acute distress. Cardiovascular: Heart sounds show a tachycardic rate Lungs: mild wheezing bl on inspiration / expiration . Abdomen: Soft, nontender, nondistended Neurological: Alert and oriented 3. Moves all extremities 4. Cranial nerves II through XII grossly intact. Skin: Warm and dry. No rashes or lesions. Extremities: No clubbing or cyanosis.  Discharge Instructions F/u with pcp and oncologist upon  discharge.    Allergies  Allergen Reactions  . Penicillins Rash    Did it involve swelling of the face/tongue/throat, SOB, or low BP? No Did it involve sudden or severe rash/hives, skin peeling, or any reaction on the inside of your mouth or nose? Yes Did you need to seek medical attention at a hospital or doctor's office? No When did it last happen? If all above answers are  "NO", may proceed with cephalosporin use.   Follow-up Information    Teodoro Spray, MD Follow up in 1 week(s).   Specialty: Cardiology Contact information: [place] [address] [phone]            The results of significant diagnostics from this hospitalization (including imaging, microbiology, ancillary and laboratory) are listed below for reference.    Significant Diagnostic Studies: DG Chest 1 View  Result Date: 08/18/2019 CLINICAL DATA:  59 year old female with shortness of breath. EXAM: CHEST  1 VIEW COMPARISON:  Chest radiograph dated 08/14/2019 and CT dated 08/06/2019 FINDINGS: Right hilar post treatment changes. There is diffuse interstitial prominence and lower lung field hazy densities likely representing edema. Pneumonia is not excluded. Clinical correlation is recommended. There is no pneumothorax. There is a small right pleural effusion. Stable cardiac silhouette. Left pectoral Port-A-Cath with tip close to the cavoatrial junction. Atherosclerotic calcification of the aorta. No acute osseous pathology. IMPRESSION: Findings likely representing edema. Pneumonia is not excluded. Electronically Signed   By: Anner Crete M.D.   On: 08/18/2019 00:50   CT Angio Chest PE W and/or Wo Contrast  Result Date: 08/06/2019 CLINICAL DATA:  Hemoptysis, history of breast cancer and COVID EXAM: CT ANGIOGRAPHY CHEST WITH CONTRAST TECHNIQUE: Multidetector CT imaging of the chest was performed using the standard protocol during bolus administration of intravenous contrast. Multiplanar CT image reconstructions and MIPs were obtained to evaluate the vascular anatomy. CONTRAST:  72m OMNIPAQUE IOHEXOL 350 MG/ML SOLN COMPARISON:  July 09, 2019 FINDINGS: Cardiovascular: There is a optimal opacification of the pulmonary arteries. There is no central,segmental, or subsegmental filling defects within the pulmonary arteries. A left-sided MediPort catheter is seen with the  tip in the right atrium. The heart is normal in size. No pericardial effusion or thickening. No evidence right heart strain. There is normal three-vessel brachiocephalic anatomy without proximal stenosis. Scattered mild aortic atherosclerosis. Mediastinum/Nodes: No hilar, mediastinal, or axillary adenopathy. Thyroid gland, trachea, and esophagus demonstrate no significant findings. Lungs/Pleura: There is new/worsening multifocal patchy predominantly peripherally based ground-glass opacities seen throughout both lungs. There is also slight interval worsening in the tree-in-bud nodular hazy opacities within the left lung apex. There is unchanged spiculated masslike consolidation within the right infrahilar region, likely from post treatment changes/fibrosis. There is a trace right pleural effusion present. Upper Abdomen: No acute abnormalities present in the visualized portions of the upper abdomen. Musculoskeletal: No chest wall abnormality. No acute or significant osseous findings. Again noted is diffuse skin thickening seen along the right breast. There is a surgical clips within the right breast. Review of the MIP images confirms the above findings. IMPRESSION: 1. No central, segmental, or subsegmental pulmonary embolism. 2. Interval worsening of the multifocal patchy ground-glass opacities throughout both lungs, consistent with multifocal pneumonia. 3. Interval slight worsening in the tree-in-bud nodular opacities within the left upper lobe, likely due to atypical infectious etiology, however cannot exclude underlying metastatic disease. 4. Post treatment radiation/fibrotic changes in the right infrahilar region. 5. Trace right pleural effusion Electronically Signed   By: BKerby Moors  Avutu M.D.   On: 08/06/2019 00:17   CT Chest High Resolution  Result Date: 08/21/2019 CLINICAL DATA:  Hypoxemia. COVID-19 in December. Breast cancer with lung metastasis status post chemotherapy and radiotherapy. EXAM: CT CHEST  WITHOUT CONTRAST TECHNIQUE: Multidetector CT imaging of the chest was performed following the standard protocol without intravenous contrast. High resolution imaging of the lungs, as well as inspiratory and expiratory imaging, was performed. COMPARISON:  08/18/2019 chest radiograph. 08/06/2019 chest CT angiogram. FINDINGS: Cardiovascular: Normal heart size. Trace pericardial effusion/thickening is stable. Left subclavian Port-A-Cath terminates at the cavoatrial junction. Mildly atherosclerotic nonaneurysmal thoracic aorta. Dilated main pulmonary artery (3.5 cm diameter). Mediastinum/Nodes: No discrete thyroid nodules. Unremarkable esophagus. Surgical clips again noted in the right axilla. No pathologically enlarged axillary lymph nodes. No discrete mediastinal adenopathy. Lungs/Pleura: No pneumothorax. Small dependent right and trace dependent left pleural effusions are unchanged. There is extensive patchy irregular thickening of the peribronchovascular interstitium and interlobular septa throughout both lungs, most prominent in the left lower lobe, which appears to be progressively worsening on multiple chest CT studies back to 04/04/2019. Central perihilar prominent interstitial thickening also appears progressively increased back to 04/04/2019 CT. Patchy ground-glass opacities throughout both lungs have worsened. Similarly, there is extensive patchy sub solid pulmonary nodularity throughout both lungs with progressive enlargement back to 07/09/2019 chest CT. Representative basilar right lower lobe 1.8 cm nodule (series 3/image 127), previously 0.6 cm on 07/09/2019 and 1.4 cm on 08/06/2019 CT. Representative right upper lobe 1.1 cm nodule (series 3/image 41), new. Medial left lower lobe 1.7 cm nodule (series 3/image 113), previously 0.9 cm on 07/09/2019 and 1.1 cm on 08/06/2019. No significant regions of bronchiectasis. No honeycombing. Moderate patchy air trapping in both lungs on the expiration sequence. Upper  abdomen: No acute abnormality. Musculoskeletal: No aggressive appearing focal osseous lesions. Mild thoracic spondylosis. IMPRESSION: 1. Extensive patchy irregular peribronchovascular and interlobular septal thickening, patchy ground-glass opacities, subsolid nodularity and irregular perihilar consolidation throughout both lungs, all progressively worsening on multiple chest CT studies back to 04/04/2019. 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 2. Stable small dependent right and trace dependent left pleural effusions. 3. Moderate patchy air trapping in both lungs, indicative of small airways disease. 4. Dilated main pulmonary artery, suggesting pulmonary arterial hypertension. 5. Aortic Atherosclerosis (ICD10-I70.0). Electronically Signed   By: Ilona Sorrel M.D.   On: 08/21/2019 12:47   DG Chest Port 1 View  Result Date: 08/14/2019 CLINICAL DATA:  Acute respiratory failure. EXAM: PORTABLE CHEST 1 VIEW COMPARISON:  Chest x-ray 08/13/2019 FINDINGS: The left subclavian power port is stable. Stable radiation changes involving the right hilum and right paramediastinal lung. Persistent patchy nodular infiltrates and small right effusion. No pneumothorax. IMPRESSION: Stable chest x-ray. Persistent patchy nodular infiltrates and small right effusion. Electronically Signed   By: Marijo Sanes M.D.   On: 08/14/2019 06:19   DG Chest Port 1 View  Result Date: 08/13/2019 CLINICAL DATA:  Shortness of breath, tachypnea with decreased oxygen saturations., history of breast cancer. EXAM: PORTABLE CHEST 1 VIEW COMPARISON:  08/06/2019, CT of the chest. FINDINGS: Masslike appearance of right hilum similar to previous exam. Cardiomediastinal contours are stable. Left Port-A-Cath terminates at the caval to atrial junction. Nodular opacities are again suggested and there is increase in interstitial prominence when  compared to the chest x-ray from 08/05/2019. Blunting of right costophrenic angles suggest scarring or small effusion. No signs of dense consolidation aside from perihilar changes  discussed above. Visualized skeletal structures without acute bone finding. IMPRESSION: Masslike appearance of right hilum similar to previous exam. Nodular opacities are again suggested and there is increase in interstitial prominence when compared to the chest x-ray from 08/05/2019. Findings may represent worsening of pulmonary parenchymal disease, associated with previously reported COVID-19 infection, particularly in the left lung base, potentially with a background of pulmonary edema. Electronically Signed   By: Zetta Bills M.D.   On: 08/13/2019 15:26   DG Chest Portable 1 View  Result Date: 08/05/2019 CLINICAL DATA:  59 year old female with hemoptysis. History of breast cancer. EXAM: PORTABLE CHEST 1 VIEW COMPARISON:  Chest radiograph dated 04/09/2017. CT dated 07/09/2019. FINDINGS: Port-A-Cath with tip in the region of the cavoatrial junction. Right hilar density corresponding to the post treatment/post radiation fibrosis seen on the prior CT. Diffuse interstitial and vascular prominence with scattered faint reticulonodular densities throughout the lungs. Probable small bilateral pleural effusions. No pneumothorax. The cardiac silhouette is within normal limits. Atherosclerotic calcification of the aorta. No acute osseous pathology. IMPRESSION: Right hilar post radiation fibrosis and scattered bilateral reticulonodular densities and small bilateral pleural effusions. Overall findings are similar to the CT of 07/09/2019. Electronically Signed   By: Anner Crete M.D.   On: 08/05/2019 22:59   ECHOCARDIOGRAM COMPLETE  Result Date: 08/14/2019    ECHOCARDIOGRAM REPORT   Patient Name:   Ashley Molina Date of Exam: 08/14/2019 Medical Rec #:  [id number]          Height:       63.0 in Accession #:    [id number]          Weight:       198.0 lb Date of Birth:  [birth year]         BSA:          1.925 m Patient Age:    41 years           BP:           105/71 mmHg Patient Gender: F                  HR:           95 bpm. Exam Location:  [facility] Procedure: 2D Echo, Cardiac Doppler and Color Doppler Indications:     Dyspnea 786.09  History:         Patient has prior history of Echocardiogram examinations, most                  recent 04/22/2019. Breast cancer, covid-19, history of                  chemotherapy.  Sonographer:     [name] RDCS (AE) Referring Phys:  [id number] [facility] AMIN Diagnosing Phys: Serafina Royals MD  Sonographer Comments: Suboptimal apical window. IMPRESSIONS  1. Left ventricular ejection fraction, by estimation, is 55 to 60%. The left ventricle has normal function. The left ventricle has no regional wall motion abnormalities. Left ventricular diastolic parameters were normal.  2. Right ventricular systolic function is normal. The right ventricular size is normal. There is normal pulmonary artery systolic pressure.  3. The mitral valve is normal in structure. Trivial mitral valve regurgitation.  4. The aortic valve is normal in structure. Aortic valve regurgitation is not visualized. FINDINGS  Left Ventricle: Left ventricular ejection fraction, by estimation, is 55 to 60%. The left ventricle has normal function. The left ventricle has no regional wall motion abnormalities. The left ventricular internal  cavity size was normal in size. There is  no left ventricular hypertrophy. Left ventricular diastolic parameters were normal. Right Ventricle: The right ventricular size is normal. No increase in right ventricular wall thickness. Right ventricular systolic function is normal. There is normal pulmonary artery systolic pressure. The tricuspid regurgitant velocity is 1.87 m/s, and  with an assumed right atrial pressure of 10 mmHg, the estimated right ventricular systolic pressure is 32.9 mmHg. Left Atrium: Left atrial size  was normal in size. Right Atrium: Right atrial size was normal in size. Pericardium: There is no evidence of pericardial effusion. Mitral Valve: The mitral valve is normal in structure. Trivial mitral valve regurgitation. Tricuspid Valve: The tricuspid valve is normal in structure. Tricuspid valve regurgitation is trivial. Aortic Valve: The aortic valve is normal in structure. Aortic valve regurgitation is not visualized. Aortic valve mean gradient measures 2.0 mmHg. Aortic valve peak gradient measures 3.3 mmHg. Aortic valve area, by VTI measures 3.78 cm. Pulmonic Valve: The pulmonic valve was normal in structure. Pulmonic valve regurgitation is not visualized. Aorta: The aortic root and ascending aorta are structurally normal, with no evidence of dilitation. IAS/Shunts: No atrial level shunt detected by color flow Doppler.  LEFT VENTRICLE PLAX 2D LVIDd:         3.86 cm  Diastology LVIDs:         2.43 cm  LV e' lateral:   9.79 cm/s LV PW:         0.82 cm  LV E/e' lateral: 6.0 LV IVS:        0.82 cm  LV e' medial:    6.96 cm/s LVOT diam:     2.00 cm  LV E/e' medial:  8.4 LV SV:         44 LV SV Index:   23 LVOT Area:     3.14 cm  RIGHT VENTRICLE RV Basal diam:  3.94 cm TAPSE (M-mode): 3.5 cm LEFT ATRIUM           Index       RIGHT ATRIUM           Index LA diam:      2.00 cm 1.04 cm/m  RA Area:     19.40 cm LA Vol (A2C): 24.8 ml 12.88 ml/m RA Volume:   58.80 ml  30.54 ml/m LA Vol (A4C): 24.8 ml 12.88 ml/m  AORTIC VALVE                   PULMONIC VALVE AV Area (Vmax):    3.11 cm    PV Vmax:        0.48 m/s AV Area (Vmean):   3.48 cm    PV Peak grad:   0.9 mmHg AV Area (VTI):     3.78 cm    RVOT Peak grad: 1 mmHg AV Vmax:           90.35 cm/s AV Vmean:          62.550 cm/s AV VTI:            0.115 m AV Peak Grad:      3.3 mmHg AV Mean Grad:      2.0 mmHg LVOT Vmax:         89.40 cm/s LVOT Vmean:        69.200 cm/s LVOT VTI:          0.139 m LVOT/AV VTI ratio: 1.20  AORTA Ao Root diam: 2.50 cm MITRAL VALVE  TRICUSPID VALVE MV Area (PHT): 8.62 cm    TR Peak grad:   14.0 mmHg MV Decel Time: 88 msec     TR Vmax:        187.00 cm/s MV E velocity: 58.70 cm/s MV A velocity: 86.80 cm/s  SHUNTS MV E/A ratio:  0.68        Systemic VTI:  0.14 m                            Systemic Diam: 2.00 cm Serafina Royals MD Electronically signed by Serafina Royals MD Signature Date/Time: 08/14/2019/1:54:24 PM    Final     Microbiology: Recent Results (from the past 240 hour(s))  Respiratory Panel by RT PCR (Flu A&B, Covid) - Nasopharyngeal Swab     Status: None   Collection Time: 08/18/19  2:23 AM   Specimen: Nasopharyngeal Swab  Result Value Ref Range Status   SARS Coronavirus 2 by RT PCR NEGATIVE NEGATIVE Final    Comment: (NOTE) SARS-CoV-2 target nucleic acids are NOT DETECTED. The SARS-CoV-2 RNA is generally detectable in upper respiratoy specimens during the acute phase of infection. The lowest concentration of SARS-CoV-2 viral copies this assay can detect is 131 copies/mL. A negative result does not preclude SARS-Cov-2 infection and should not be used as the sole basis for treatment or other patient management decisions. A negative result may occur with  improper specimen collection/handling, submission of specimen other than nasopharyngeal swab, presence of viral mutation(s) within the areas targeted by this assay, and inadequate number of viral copies (<131 copies/mL). A negative result must be combined with clinical observations, patient history, and epidemiological information. The expected result is Negative. Fact Sheet for Patients:  PinkCheek.be Fact Sheet for Healthcare Providers:  GravelBags.it This test is not yet ap proved or cleared by the [place] FDA and  has been authorized for detection and/or diagnosis of SARS-CoV-2 by FDA under an Emergency Use Authorization (EUA). This EUA will remain  in effect (meaning this test  can be used) for the duration of the COVID-19 declaration under Section 564(b)(1) of the Act, 21 U.S.C. section 360bbb-3(b)(1), unless the authorization is terminated or revoked sooner.    Influenza A by PCR NEGATIVE NEGATIVE Final   Influenza B by PCR NEGATIVE NEGATIVE Final    Comment: (NOTE) The Xpert Xpress SARS-CoV-2/FLU/RSV assay is intended as an aid in  the diagnosis of influenza from Nasopharyngeal swab specimens and  should not be used as a sole basis for treatment. Nasal washings and  aspirates are unacceptable for Xpert Xpress SARS-CoV-2/FLU/RSV  testing. Fact Sheet for Patients: PinkCheek.be Fact Sheet for Healthcare Providers: GravelBags.it This test is not yet approved or cleared by the [place] FDA and  has been authorized for detection and/or diagnosis of SARS-CoV-2 by  FDA under an Emergency Use Authorization (EUA). This EUA will remain  in effect (meaning this test can be used) for the duration of the  Covid-19 declaration under Section 564(b)(1) of the Act, 21  U.S.C. section 360bbb-3(b)(1), unless the authorization is  terminated or revoked. Performed at [facility], [address] [place], [place] [address]   CULTURE, BLOOD (ROUTINE X 2) w Reflex to ID Panel     Status: None   Collection Time: 08/18/19  8:19 AM   Specimen: BLOOD  Result Value Ref Range Status   Specimen Description BLOOD LEFT HAND   Final   Special Requests   Final  BOTTLES DRAWN AEROBIC AND ANAEROBIC Blood Culture adequate volume   Culture   Final    NO GROWTH 5 DAYS Performed at [facility], [address] [place], [place] [address]    Report Status 08/23/2019 FINAL  Final  CULTURE, BLOOD (ROUTINE X 2) w Reflex to ID Panel     Status: None   Collection Time: 08/18/19  8:24 AM   Specimen: BLOOD  Result Value Ref Range Status   Specimen Description BLOOD LEFT WRIST  Final   Special Requests   Final     BOTTLES DRAWN AEROBIC AND ANAEROBIC Blood Culture adequate volume   Culture   Final    NO GROWTH 5 DAYS Performed at [facility], [address] [place], [place] [address]    Report Status 08/23/2019 FINAL  Final  Aspergillus Ag, BAL/Serum     Status: None   Collection Time: 08/20/19  6:24 AM   Specimen: Vein  Result Value Ref Range Status   Aspergillus Ag, BAL/Serum 0.03 0.00 - 0.49 Index Final    Comment: (NOTE) Performed At: [facility] [place], [place] [id number] Rush Farmer MD TM:[id number] Performed At: [facility] [address] [place], [place] [id number] Katina Degree MDPhD YC:[id number]      Labs: Basic Metabolic Panel: Recent Labs  Lab 08/19/19 0444 08/19/19 0444 08/20/19 0624 08/22/19 0251 08/23/19 0441 08/24/19 0443 08/25/19 0318  NA 141   < > 138 135 134* 137 135  K 3.9   < > 3.2* 3.3* 3.7 3.4* 3.8  CL 96*   < > 93* 91* 79* 92* 94*  CO2 31   < > 32 _0 GLUCOSE 142*   < > 140* 135* 130* 145* 138*  BUN 32*   < > 34* 33* 24* 26* 29*  CREATININE 0.81   < > 0.72 0.70 0.59 0.80 0.77  CALCIUM 10.1   < > 9.7 9.8 8.4* 10.2 10.1  MG 2.4   < > 2.3 2.4 2.1 2.2 2.4  PHOS 4.3  --  3.9 3.1  --   --   --    < > = values in this interval not displayed.   Liver Function Tests: Recent Labs  Lab 08/20/19 0624 08/22/19 0251 08/23/19 0441 08/24/19 0443 08/25/19 0318  AST _1 ALT _2 ALKPHOS 80 80 64 72 72  BILITOT 1.0 0.8 0.7 0.8 0.7  PROT 6.0* 6.0* 5.1* 5.5* 5.5*  ALBUMIN 3.3* 3.3* 2.8* 3.1* 3.1*   No results for input(s): LIPASE, AMYLASE in the last 168 hours. No results for input(s): AMMONIA in the last 168 hours. CBC: Recent Labs  Lab 08/20/19 0624 08/21/19 1311 08/22/19 0251 08/23/19 0441 08/24/19 0443  WBC 12.9* 17.6* 10.3 6.4 10.3  NEUTROABS 12.0* 15.2* 9.1* 5.9 9.5*  HGB 12.1 13.0 11.9* 11.9* 11.9*  HCT 36.8 39.8 37.5 37.2 36.8  MCV 89.5 89.0 89.9 89.9 89.8  PLT 188  224 204 207 241   Cardiac Enzymes: No results for input(s): CKTOTAL, CKMB, CKMBINDEX, TROPONINI in the last 168 hours. BNP: BNP (last 3 results) Recent Labs    08/14/19 0517 08/18/19 0045 08/19/19 0444  BNP 602.0* 400.0* 291.0*    ProBNP (last 3 results) No results for input(s): PROBNP in the last 8760 hours.  CBG: No results for input(s): GLUCAP in the last 168 hours.  Signed:  Para Skeans MD.  [facility] 08/25/2019, 11:33  AM    

## 2019-08-25 NOTE — Progress Notes (Signed)
[place]  [phone] [phone]   Name: JOANMARIE TSANG Date: 08/25/2019 MRN: [id number]  DOB: 14-Oct-1960  Patient Care Team: Patient, No Pcp Per as PCP - General ([place]) Bary Castilla, Forest Gleason, MD (General Surgery) Gae Dry, MD as Referring Physician (Obstetrics and Gynecology)    REASON FOR CONSULTATION: MYLIN GIGNAC is a 59 y.o. 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 Patient had COVID-19 in December 2020.  PMH also notable for O2 dependent COPD.  She has several recent hospitalizations including 08/06/2019-08/07/2019 with hypoxic respiratory failure thought secondary to pneumonitis secondary to COVID-19 versus gemcitabine.  She was readmitted 08/13/2019-08/16/2019 with same.  She is now readmitted 08/17/2019 again with hypoxic respiratory failure.  Palliative care was consulted help address goals.  CODE STATUS: DNR/DNI  PAST MEDICAL HISTORY: Past Medical History:  Diagnosis Date  . Anemia   . Breast cancer ([place])   . Breast cancer, right ([place]) 04/2017   Hx Lumpectomy, Chemo + Rad tx's.  . Breast cyst, right    aspirated by Dr. Bary Castilla  . COVID-19   . Hyperlipidemia   . Personal history of chemotherapy   . Pre-diabetes     PAST SURGICAL HISTORY:  Past Surgical History:  Procedure Laterality Date  . AXILLARY LYMPH NODE BIOPSY Right 04/09/2017   Procedure: AXILLARY LYMPH NODE BIOPSY;  Surgeon: Robert Bellow, MD;  Location: [facility] ORS;  Service: General;  Laterality: Right;  . BREAST BIOPSY Right 03/27/2017   US guided breast mass - invasive mammary carcinoma  . BREAST BIOPSY Right 03/27/2017   Lymph node - metastatic carcinoma  . BREAST BIOPSY Right 04/09/2017   Lymph node   . BREAST CYST ASPIRATION Right 11/2009   Dr. Bary Castilla did FNA  . BREAST  EXCISIONAL BIOPSY Right 09/28/2017   lumpectomy and 12 lympnode rad neo adj chemo  . COLONOSCOPY  2011  . DILATION AND CURETTAGE OF UTERUS     X3  . ENDOBRONCHIAL ULTRASOUND N/A 04/09/2017   Procedure: ENDOBRONCHIAL ULTRASOUND;  Surgeon: Laverle Hobby, MD;  Location: [facility] ORS;  Service: Pulmonary;  Laterality: N/A;  . ENDOMETRIAL ABLATION    . ENDOMETRIAL BIOPSY  09/2009  . PORTACATH PLACEMENT Left 04/09/2017   Procedure: INSERTION PORT-A-CATH;  Surgeon: Robert Bellow, MD;  Location: [facility] ORS;  Service: General;  Laterality: Left;    HEMATOLOGY/ONCOLOGY HISTORY:  Oncology History  Malignant neoplasm of upper-outer quadrant of right breast in female, estrogen receptor negative ([place])  04/04/2017 Cancer Staging   Staging form: Breast, AJCC 8th Edition - Clinical stage from 04/04/2017: Stage IV (cT3, cN1, cM1, G3, ER-, PR+, HER2-) - Signed by Sindy Guadeloupe, MD on 09/18/2017   04/05/2017 Initial Diagnosis   Malignant neoplasm of upper-outer quadrant of right breast in female, estrogen receptor negative ([place])   09/12/2018 - 01/08/2019 Chemotherapy   The patient had pegfilgrastim (NEULASTA ONPRO KIT) injection 6 mg, 6 mg, Subcutaneous, Once, 4 of 5 cycles Administration: 6 mg (10/03/2018), 6 mg (11/01/2018), 6 mg (10/17/2018), 6 mg (11/14/2018), 6 mg (11/28/2018), 6 mg (12/12/2018), 6 mg (12/26/2018) eriBULin mesylate (HALAVEN) 2.85 mg in sodium chloride 0.9 % 100 mL chemo infusion, 1.4 mg/m2 = 2.85 mg, Intravenous,  Once, 4 of 5 cycles Dose modification: 1.05 mg/m2 (original dose 1.4 mg/m2, Cycle 1, Reason: Other (see comments), Comment: neutropenia), 1.4 mg/m2 (original dose 1.4 mg/m2, Cycle 2,  Reason: Other (see comments)) Administration: 2.85 mg (09/12/2018), 2.15 mg (10/03/2018), 2.85 mg (11/01/2018), 2.85 mg (10/17/2018), 2.85 mg (11/14/2018), 2.85 mg (11/28/2018), 2.85 mg (12/12/2018), 2.85 mg (12/26/2018)  for chemotherapy treatment.    01/20/2019 -  Chemotherapy   The patient had palonosetron  (ALOXI) injection 0.25 mg, 0.25 mg, Intravenous,  Once, 8 of 9 cycles Administration: 0.25 mg (01/30/2019), 0.25 mg (02/20/2019), 0.25 mg (03/21/2019), 0.25 mg (04/10/2019), 0.25 mg (06/05/2019), 0.25 mg (07/17/2019) pegfilgrastim (NEULASTA ONPRO KIT) injection 6 mg, 6 mg, Subcutaneous, Once, 7 of 8 cycles Administration: 6 mg (01/30/2019), 6 mg (03/07/2019), 6 mg (03/27/2019), 6 mg (04/17/2019), 6 mg (06/12/2019), 6 mg (07/03/2019), 6 mg (07/24/2019) CARBOplatin (PARAPLATIN) 260 mg in sodium chloride 0.9 % 250 mL chemo infusion, 260 mg (100 % of original dose 255 mg), Intravenous,  Once, 2 of 2 cycles Dose modification:   (original dose 255 mg, Cycle 1), 260 mg (original dose 255 mg, Cycle 2) Administration: 260 mg (01/20/2019), 260 mg (01/30/2019), 260 mg (02/20/2019) gemcitabine (GEMZAR) 1,596 mg in sodium chloride 0.9 % 250 mL chemo infusion, 800 mg/m2 = 1,596 mg (100 % of original dose 800 mg/m2), Intravenous,  Once, 8 of 9 cycles Dose modification: 800 mg/m2 (original dose 800 mg/m2, Cycle 1, Reason: Other (see comments), Comment: chemo induced neutropenia) Administration: 1,596 mg (01/20/2019), 1,600 mg (01/30/2019), 1,600 mg (02/20/2019), 1,600 mg (03/07/2019), 1,600 mg (03/21/2019), 1,600 mg (03/27/2019), 1,600 mg (04/10/2019), 1,600 mg (04/17/2019), 1,600 mg (05/06/2019), 1,600 mg (06/05/2019), 1,600 mg (06/12/2019), 1,600 mg (06/26/2019), 1,600 mg (07/03/2019), 1,600 mg (07/17/2019), 1,600 mg (07/24/2019)  for chemotherapy treatment.      ALLERGIES:  is allergic to penicillins.  MEDICATIONS:  Current Facility-Administered Medications  Medication Dose Route Frequency Provider Last Rate Last Admin  . 0.9 %  sodium chloride infusion  250 mL Intravenous PRN Mansy, Jan A, MD 20 mL/hr at 08/22/19 0655 250 mL at 08/22/19 0655  . acetaminophen (TYLENOL) tablet 650 mg  650 mg Oral Q4H PRN Mansy, Jan A, MD      . aspirin EC tablet 81 mg  81 mg Oral Daily Mansy, Jan A, MD   81 mg at 08/25/19 0841  . budesonide  (PULMICORT) nebulizer solution 0.5 mg  0.5 mg Nebulization BID Mansy, Jan A, MD   0.5 mg at 08/25/19 0740  . Chlorhexidine Gluconate Cloth 2 % PADS 6 each  6 each Topical Daily Elodia Florence., MD   6 each at 08/25/19 [phone]  . cholecalciferol (VITAMIN D3) tablet 1,000 Units  1,000 Units Oral QHS Mansy, Arvella Merles, MD   1,000 Units at 08/24/19 2139  . diltiazem (CARDIZEM CD) 24 hr capsule 180 mg  180 mg Oral QHS Florina Ou V, MD      . enoxaparin (LOVENOX) injection 40 mg  40 mg Subcutaneous Q24H Mansy, Jan A, MD   40 mg at 08/25/19 0843  . ferrous sulfate tablet 325 mg  325 mg Oral QHS Mansy, Jan A, MD   325 mg at 08/24/19 2140  . furosemide (LASIX) tablet 20 mg  20 mg Oral BID Para Skeans, MD   20 mg at 08/25/19 0841  . guaiFENesin-dextromethorphan (ROBITUSSIN DM) 100-10 MG/5ML syrup 15 mL  15 mL Oral Q4H PRN Mansy, Jan A, MD   15 mL at 08/25/19 0849  . ipratropium (ATROVENT) nebulizer solution 0.5 mg  0.5 mg Nebulization Q6H Elodia Florence., MD   0.5 mg at 08/25/19 0740  . ipratropium-albuterol (DUONEB) 0.5-2.5 (3) MG/3ML nebulizer solution  3 mL  3 mL Nebulization Once Blake Divine, MD      . levalbuterol [facility]) nebulizer solution 0.63 mg  0.63 mg Nebulization Q6H Elodia Florence., MD   0.63 mg at 08/25/19 0740  . LORazepam (ATIVAN) tablet 0.5 mg  0.5 mg Oral Q6H PRN Neizan Debruhl, Kirt Boys, NP   0.5 mg at 08/25/19 1039  . morphine 2 MG/ML injection 1-2 mg  1-2 mg Intravenous Q2H PRN Ayleen Mckinstry, Kirt Boys, NP   2 mg at 08/25/19 1040  . multivitamin with minerals tablet 1 tablet  1 tablet Oral QHS Mansy, Arvella Merles, MD   1 tablet at 08/24/19 2139  . omega-3 acid ethyl esters (LOVAZA) capsule 1 g  1 g Oral Daily Mansy, Jan A, MD   1 g at 08/25/19 0841  . ondansetron (ZOFRAN) injection 4 mg  4 mg Intravenous Q6H PRN Mansy, Jan A, MD      . pantoprazole (PROTONIX) EC tablet 20 mg  20 mg Oral Daily Mansy, Jan A, MD   20 mg at 08/25/19 2549  . polyethylene glycol (MIRALAX / GLYCOLAX) packet 17 g   17 g Oral Daily Lang Snow, NP   17 g at 08/25/19 0840  . potassium chloride SA (KLOR-CON) CR tablet 20 mEq  20 mEq Oral Daily Teodoro Spray, MD   20 mEq at 08/25/19 0841  . [START ON 08/26/2019] predniSONE (STERAPRED UNI-PAK 21 TAB) tablet 10 mg  10 mg Oral 4X daily taper Florina Ou V, MD      . sodium chloride flush (NS) 0.9 % injection 3 mL  3 mL Intravenous Q12H Mansy, Jan A, MD   3 mL at 08/25/19 0848  . sodium chloride flush (NS) 0.9 % injection 3 mL  3 mL Intravenous PRN Mansy, Jan A, MD        VITAL SIGNS: BP 110/72 (BP Location: Left Arm)   Pulse (!) 105   Temp 97.7 F (36.5 C) (Oral)   Resp 17   Ht _0  (1.6 m)   Wt 190 lb 0.6 oz (86.2 kg)   LMP 04/04/2012 (Approximate)   SpO2 93%   BMI 33.66 kg/m  Filed Weights   08/20/19 0500 08/21/19 0424 08/22/19 0644  Weight: 203 lb 14.8 oz (92.5 kg) 203 lb 7.8 oz (92.3 kg) 190 lb 0.6 oz (86.2 kg)    Estimated body mass index is 33.66 kg/m as calculated from the following:   Height as of this encounter: _1  (1.6 m).   Weight as of this encounter: 190 lb 0.6 oz (86.2 kg).  LABS: CBC:    Component Value Date/Time   WBC 10.3 08/24/2019 0443   HGB 11.9 (L) 08/24/2019 0443   HGB 12.9 03/28/2017 0805   HCT 36.8 08/24/2019 0443   HCT 38.2 03/28/2017 0805   PLT 241 08/24/2019 0443   PLT 245 03/28/2017 0805   MCV 89.8 08/24/2019 0443   MCV 81 03/28/2017 0805   NEUTROABS 9.5 (H) 08/24/2019 0443   NEUTROABS 2.7 03/28/2017 0805   LYMPHSABS 0.2 (L) 08/24/2019 0443   LYMPHSABS 1.1 03/28/2017 0805   MONOABS 0.4 08/24/2019 0443   EOSABS 0.0 08/24/2019 0443   EOSABS 0.1 03/28/2017 0805   BASOSABS 0.0 08/24/2019 0443   BASOSABS 0.1 03/28/2017 0805   Comprehensive Metabolic Panel:    Component Value Date/Time   NA 135 08/25/2019 0318   NA 140 03/28/2017 0805   K 3.8 08/25/2019 0318   CL 94 (L) 08/25/2019 8264  CO2 30 08/25/2019 0318   BUN 29 (H) 08/25/2019 0318   BUN 15 03/28/2017 0805   CREATININE 0.77  08/25/2019 0318   GLUCOSE 138 (H) 08/25/2019 0318   CALCIUM 10.1 08/25/2019 0318   AST 24 08/25/2019 0318   ALT 25 08/25/2019 0318   ALKPHOS 72 08/25/2019 0318   BILITOT 0.7 08/25/2019 0318   BILITOT 0.4 03/28/2017 0805   PROT 5.5 (L) 08/25/2019 0318   PROT 6.9 03/28/2017 0805   ALBUMIN 3.1 (L) 08/25/2019 0318   ALBUMIN 4.3 03/28/2017 0805    RADIOGRAPHIC STUDIES: DG Chest 1 View  Result Date: 08/18/2019 CLINICAL DATA:  59 year old female with shortness of breath. EXAM: CHEST  1 VIEW COMPARISON:  Chest radiograph dated 08/14/2019 and CT dated 08/06/2019 FINDINGS: Right hilar post treatment changes. There is diffuse interstitial prominence and lower lung field hazy densities likely representing edema. Pneumonia is not excluded. Clinical correlation is recommended. There is no pneumothorax. There is a small right pleural effusion. Stable cardiac silhouette. Left pectoral Port-A-Cath with tip close to the cavoatrial junction. Atherosclerotic calcification of the aorta. No acute osseous pathology. IMPRESSION: Findings likely representing edema. Pneumonia is not excluded. Electronically Signed   By: Anner Crete M.D.   On: 08/18/2019 00:50   CT Angio Chest PE W and/or Wo Contrast  Result Date: 08/06/2019 CLINICAL DATA:  Hemoptysis, history of breast cancer and COVID EXAM: CT ANGIOGRAPHY CHEST WITH CONTRAST TECHNIQUE: Multidetector CT imaging of the chest was performed using the standard protocol during bolus administration of intravenous contrast. Multiplanar CT image reconstructions and MIPs were obtained to evaluate the vascular anatomy. CONTRAST:  54m OMNIPAQUE IOHEXOL 350 MG/ML SOLN COMPARISON:  July 09, 2019 FINDINGS: Cardiovascular: There is a optimal opacification of the pulmonary arteries. There is no central,segmental, or subsegmental filling defects within the pulmonary arteries. A left-sided MediPort catheter is seen with the tip in the right atrium. The heart is normal in size. No  pericardial effusion or thickening. No evidence right heart strain. There is normal three-vessel brachiocephalic anatomy without proximal stenosis. Scattered mild aortic atherosclerosis. Mediastinum/Nodes: No hilar, mediastinal, or axillary adenopathy. Thyroid gland, trachea, and esophagus demonstrate no significant findings. Lungs/Pleura: There is new/worsening multifocal patchy predominantly peripherally based ground-glass opacities seen throughout both lungs. There is also slight interval worsening in the tree-in-bud nodular hazy opacities within the left lung apex. There is unchanged spiculated masslike consolidation within the right infrahilar region, likely from post treatment changes/fibrosis. There is a trace right pleural effusion present. Upper Abdomen: No acute abnormalities present in the visualized portions of the upper abdomen. Musculoskeletal: No chest wall abnormality. No acute or significant osseous findings. Again noted is diffuse skin thickening seen along the right breast. There is a surgical clips within the right breast. Review of the MIP images confirms the above findings. IMPRESSION: 1. No central, segmental, or subsegmental pulmonary embolism. 2. Interval worsening of the multifocal patchy ground-glass opacities throughout both lungs, consistent with multifocal pneumonia. 3. Interval slight worsening in the tree-in-bud nodular opacities within the left upper lobe, likely due to atypical infectious etiology, however cannot exclude underlying metastatic disease. 4. Post treatment radiation/fibrotic changes in the right infrahilar region. 5. Trace right pleural effusion Electronically Signed   By: BPrudencio PairM.D.   On: 08/06/2019 00:17   CT Chest High Resolution  Result Date: 08/21/2019 CLINICAL DATA:  Hypoxemia. COVID-19 in December. Breast cancer with lung metastasis status post chemotherapy and radiotherapy. EXAM: CT CHEST WITHOUT CONTRAST TECHNIQUE: Multidetector CT imaging of the  chest was performed following the standard protocol without intravenous contrast. High resolution imaging of the lungs, as well as inspiratory and expiratory imaging, was performed. COMPARISON:  08/18/2019 chest radiograph. 08/06/2019 chest CT angiogram. FINDINGS: Cardiovascular: Normal heart size. Trace pericardial effusion/thickening is stable. Left subclavian Port-A-Cath terminates at the cavoatrial junction. Mildly atherosclerotic nonaneurysmal thoracic aorta. Dilated main pulmonary artery (3.5 cm diameter). Mediastinum/Nodes: No discrete thyroid nodules. Unremarkable esophagus. Surgical clips again noted in the right axilla. No pathologically enlarged axillary lymph nodes. No discrete mediastinal adenopathy. Lungs/Pleura: No pneumothorax. Small dependent right and trace dependent left pleural effusions are unchanged. There is extensive patchy irregular thickening of the peribronchovascular interstitium and interlobular septa throughout both lungs, most prominent in the left lower lobe, which appears to be progressively worsening on multiple chest CT studies back to 04/04/2019. Central perihilar prominent interstitial thickening also appears progressively increased back to 04/04/2019 CT. Patchy ground-glass opacities throughout both lungs have worsened. Similarly, there is extensive patchy sub solid pulmonary nodularity throughout both lungs with progressive enlargement back to 07/09/2019 chest CT. Representative basilar right lower lobe 1.8 cm nodule (series 3/image 127), previously 0.6 cm on 07/09/2019 and 1.4 cm on 08/06/2019 CT. Representative right upper lobe 1.1 cm nodule (series 3/image 41), new. Medial left lower lobe 1.7 cm nodule (series 3/image 113), previously 0.9 cm on 07/09/2019 and 1.1 cm on 08/06/2019. No significant regions of bronchiectasis. No honeycombing. Moderate patchy air trapping in both lungs on the expiration sequence. Upper abdomen: No acute abnormality. Musculoskeletal: No aggressive  appearing focal osseous lesions. Mild thoracic spondylosis. IMPRESSION: 1. Extensive patchy irregular peribronchovascular and interlobular septal thickening, patchy ground-glass opacities, subsolid nodularity and irregular perihilar consolidation throughout both lungs, all progressively worsening on multiple chest CT studies back to 04/04/2019. 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 2. Stable small dependent right and trace dependent left pleural effusions. 3. Moderate patchy air trapping in both lungs, indicative of small airways disease. 4. Dilated main pulmonary artery, suggesting pulmonary arterial hypertension. 5. Aortic Atherosclerosis (ICD10-I70.0). Electronically Signed   By: Ilona Sorrel M.D.   On: 08/21/2019 12:47   DG Chest Port 1 View  Result Date: 08/14/2019 CLINICAL DATA:  Acute respiratory failure. EXAM: PORTABLE CHEST 1 VIEW COMPARISON:  Chest x-ray 08/13/2019 FINDINGS: The left subclavian power port is stable. Stable radiation changes involving the right hilum and right paramediastinal lung. Persistent patchy nodular infiltrates and small right effusion. No pneumothorax. IMPRESSION: Stable chest x-ray. Persistent patchy nodular infiltrates and small right effusion. Electronically Signed   By: Marijo Sanes M.D.   On: 08/14/2019 06:19   DG Chest Port 1 View  Result Date: 08/13/2019 CLINICAL DATA:  Shortness of breath, tachypnea with decreased oxygen saturations., history of breast cancer. EXAM: PORTABLE CHEST 1 VIEW COMPARISON:  08/06/2019, CT of the chest. FINDINGS: Masslike appearance of right hilum similar to previous exam. Cardiomediastinal contours are stable. Left Port-A-Cath terminates at the caval to atrial junction. Nodular opacities are again suggested and there is increase in interstitial prominence when compared to the chest x-ray from 08/05/2019. Blunting of right  costophrenic angles suggest scarring or small effusion. No signs of dense consolidation aside from perihilar changes discussed above. Visualized skeletal structures without acute bone finding. IMPRESSION: Masslike appearance of right hilum similar to previous exam. Nodular opacities are again suggested and there is increase in interstitial prominence when compared to the chest x-ray from 08/05/2019. Findings may represent worsening of pulmonary parenchymal  disease, associated with previously reported COVID-19 infection, particularly in the left lung base, potentially with a background of pulmonary edema. Electronically Signed   By: Zetta Bills M.D.   On: 08/13/2019 15:26   DG Chest Portable 1 View  Result Date: 08/05/2019 CLINICAL DATA:  59 year old female with hemoptysis. History of breast cancer. EXAM: PORTABLE CHEST 1 VIEW COMPARISON:  Chest radiograph dated 04/09/2017. CT dated 07/09/2019. FINDINGS: Port-A-Cath with tip in the region of the cavoatrial junction. Right hilar density corresponding to the post treatment/post radiation fibrosis seen on the prior CT. Diffuse interstitial and vascular prominence with scattered faint reticulonodular densities throughout the lungs. Probable small bilateral pleural effusions. No pneumothorax. The cardiac silhouette is within normal limits. Atherosclerotic calcification of the aorta. No acute osseous pathology. IMPRESSION: Right hilar post radiation fibrosis and scattered bilateral reticulonodular densities and small bilateral pleural effusions. Overall findings are similar to the CT of 07/09/2019. Electronically Signed   By: Anner Crete M.D.   On: 08/05/2019 22:59   ECHOCARDIOGRAM COMPLETE  Result Date: 08/14/2019    ECHOCARDIOGRAM REPORT   Patient Name:   Ashley Molina Date of Exam: 08/14/2019 Medical Rec #:  [id number]          Height:       63.0 in Accession #:    [id number]         Weight:       198.0 lb Date of Birth:  [birth year]         BSA:           1.925 m Patient Age:    6 years           BP:           105/71 mmHg Patient Gender: F                  HR:           95 bpm. Exam Location:  [facility] Procedure: 2D Echo, Cardiac Doppler and Color Doppler Indications:     Dyspnea 786.09  History:         Patient has prior history of Echocardiogram examinations, most                  recent 04/22/2019. Breast cancer, covid-19, history of                  chemotherapy.  Sonographer:     [name] RDCS (AE) Referring Phys:  [id number] [facility] AMIN Diagnosing Phys: Serafina Royals MD  Sonographer Comments: Suboptimal apical window. IMPRESSIONS  1. Left ventricular ejection fraction, by estimation, is 55 to 60%. The left ventricle has normal function. The left ventricle has no regional wall motion abnormalities. Left ventricular diastolic parameters were normal.  2. Right ventricular systolic function is normal. The right ventricular size is normal. There is normal pulmonary artery systolic pressure.  3. The mitral valve is normal in structure. Trivial mitral valve regurgitation.  4. The aortic valve is normal in structure. Aortic valve regurgitation is not visualized. FINDINGS  Left Ventricle: Left ventricular ejection fraction, by estimation, is 55 to 60%. The left ventricle has normal function. The left ventricle has no regional wall motion abnormalities. The left ventricular internal cavity size was normal in size. There is  no left ventricular hypertrophy. Left ventricular diastolic parameters were normal. Right Ventricle: The right ventricular size is normal. No increase in right ventricular wall thickness. Right ventricular systolic function is normal. There is normal pulmonary artery systolic  pressure. The tricuspid regurgitant velocity is 1.87 m/s, and  with an assumed right atrial pressure of 10 mmHg, the estimated right ventricular systolic pressure is 26.9 mmHg. Left Atrium: Left atrial size was normal in size. Right Atrium: Right atrial size was normal in size.  Pericardium: There is no evidence of pericardial effusion. Mitral Valve: The mitral valve is normal in structure. Trivial mitral valve regurgitation. Tricuspid Valve: The tricuspid valve is normal in structure. Tricuspid valve regurgitation is trivial. Aortic Valve: The aortic valve is normal in structure. Aortic valve regurgitation is not visualized. Aortic valve mean gradient measures 2.0 mmHg. Aortic valve peak gradient measures 3.3 mmHg. Aortic valve area, by VTI measures 3.78 cm. Pulmonic Valve: The pulmonic valve was normal in structure. Pulmonic valve regurgitation is not visualized. Aorta: The aortic root and ascending aorta are structurally normal, with no evidence of dilitation. IAS/Shunts: No atrial level shunt detected by color flow Doppler.  LEFT VENTRICLE PLAX 2D LVIDd:         3.86 cm  Diastology LVIDs:         2.43 cm  LV e' lateral:   9.79 cm/s LV PW:         0.82 cm  LV E/e' lateral: 6.0 LV IVS:        0.82 cm  LV e' medial:    6.96 cm/s LVOT diam:     2.00 cm  LV E/e' medial:  8.4 LV SV:         44 LV SV Index:   23 LVOT Area:     3.14 cm  RIGHT VENTRICLE RV Basal diam:  3.94 cm TAPSE (M-mode): 3.5 cm LEFT ATRIUM           Index       RIGHT ATRIUM           Index LA diam:      2.00 cm 1.04 cm/m  RA Area:     19.40 cm LA Vol (A2C): 24.8 ml 12.88 ml/m RA Volume:   58.80 ml  30.54 ml/m LA Vol (A4C): 24.8 ml 12.88 ml/m  AORTIC VALVE                   PULMONIC VALVE AV Area (Vmax):    3.11 cm    PV Vmax:        0.48 m/s AV Area (Vmean):   3.48 cm    PV Peak grad:   0.9 mmHg AV Area (VTI):     3.78 cm    RVOT Peak grad: 1 mmHg AV Vmax:           90.35 cm/s AV Vmean:          62.550 cm/s AV VTI:            0.115 m AV Peak Grad:      3.3 mmHg AV Mean Grad:      2.0 mmHg LVOT Vmax:         89.40 cm/s LVOT Vmean:        69.200 cm/s LVOT VTI:          0.139 m LVOT/AV VTI ratio: 1.20  AORTA Ao Root diam: 2.50 cm MITRAL VALVE               TRICUSPID VALVE MV Area (PHT): 8.62 cm    TR Peak grad:    14.0 mmHg MV Decel Time: 88 msec     TR Vmax:  187.00 cm/s MV E velocity: 58.70 cm/s MV A velocity: 86.80 cm/s  SHUNTS MV E/A ratio:  0.68        Systemic VTI:  0.14 m                            Systemic Diam: 2.00 cm Serafina Royals MD Electronically signed by Serafina Royals MD Signature Date/Time: 08/14/2019/1:54:24 PM    Final     PERFORMANCE STATUS (ECOG) : 3 - Symptomatic, >50% confined to bed  Review of Systems Unless otherwise noted, a complete review of systems is negative.  Physical Exam General: NAD Pulmonary: unlabored, on O2 Extremities: no edema, no joint deformities Skin: no rashes Neurological: Weakness but otherwise nonfocal  IMPRESSION: Weekend notes reviewed.  Patient seems slightly more short of breath this morning.  She says that the morphine and lorazepam are helping when given as needed.  There was some issue with the pharmacy filling morphine elixir/p.o. lorazepam.  I called her pharmacy and insurance would not approve the quantities as prescribed.  Husband says he plans to pay out-of-pocket.  Plan is for patient to be admitted to hospice services at home later today.  Patient notes that she is slightly tachycardic this morning.  She says that the Cardizem was DC'd over the weekend.  Discussed with Dr. Posey Pronto.  Controlling her tachycardia will likely help her dyspnea.  PLAN: -Home with hospice -Continue morphine/lorazepam for comfort   Time Total: 15 minutes  Visit consisted of counseling and education dealing with the complex and emotionally intense issues of symptom management and palliative care in the setting of serious and potentially life-threatening illness.Greater than 50%  of this time was spent counseling and coordinating care related to the above assessment and plan.  Signed by: Altha Harm, PhD, NP-C

## 2019-08-25 NOTE — Telephone Encounter (Signed)
Patient discharging form hospital and wants Hospice services at home.  Asking if Dr Janese Banks in agreement and if she will sign orders and serve as attending. Please advise

## 2019-08-25 NOTE — Progress Notes (Signed)
Follow up visit made to new referral for [facility] hospice services at home. Patient seen sitting up in th recliner, daughter Benjamine Mola at bedside. Patient appears more short of breath today and was not able to ambulate with PT. Discussed EMS transport with patient, her daughter in the room and her husband via telephone, all agreeable to EMS transport home. DME is in place, several phone calls regarding prescriptions with Mr. Orlikowski, pharmacy and Palliative NP Josh B.orders. Staff RN Deidre updated. Signed out of facility DNR in place in discharge packet. Thank you. Flo Shanks BSN, RN, [place] [phone]

## 2019-08-25 NOTE — Telephone Encounter (Signed)
yes

## 2019-08-27 ENCOUNTER — Ambulatory Visit: Payer: Managed Care, Other (non HMO) | Admitting: Pulmonary Disease

## 2019-08-28 ENCOUNTER — Inpatient Hospital Stay: Payer: Managed Care, Other (non HMO)

## 2019-08-28 ENCOUNTER — Inpatient Hospital Stay: Payer: Managed Care, Other (non HMO) | Admitting: Oncology

## 2019-09-04 DEATH — deceased

## 2020-10-21 IMAGING — CT CT CHEST HIGH RESOLUTION W/O CM
2 of 5 series · 14 of 36 positions shown, 17 images · non-contrast
Comparison: 08/18/2019 chest radiograph. 08/06/2019 chest CT
angiogram.

CLINICAL DATA: Hypoxemia. [id number] in [REDACTED]. Breast cancer with
lung metastasis status post chemotherapy and radiotherapy.

EXAM:
CT CHEST WITHOUT CONTRAST
TECHNIQUE: Multidetector CT imaging of the chest was performed following the
standard protocol without intravenous contrast. High resolution
imaging of the lungs, as well as inspiratory and expiratory imaging,
was performed.

[Series 2: thorax · axial · 0.64mm/px · z∈[-592,-332]mm · 11 of 150 slices shown, 14 images]
[im 13/150  mediastinal]
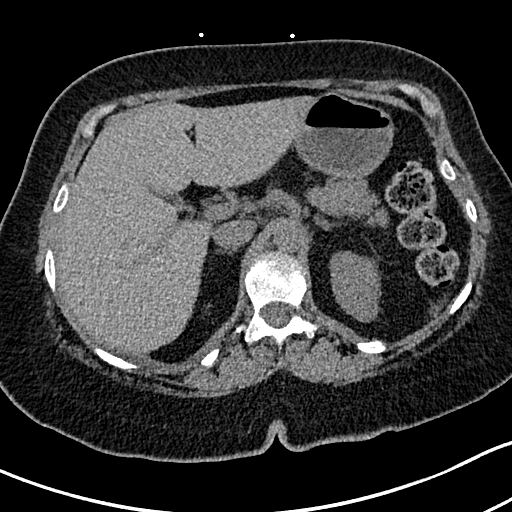
[im 13/150  lung]
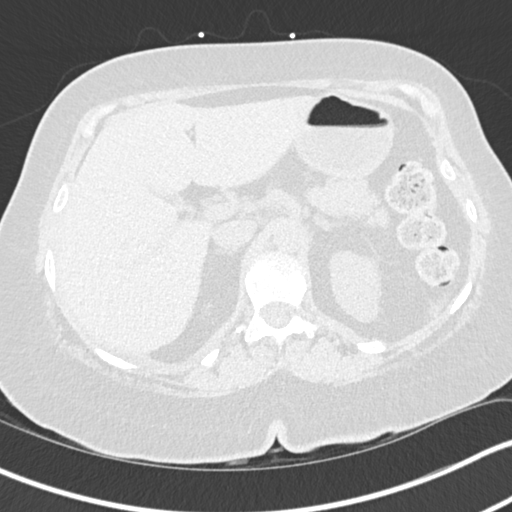
[im 26/150  lung]
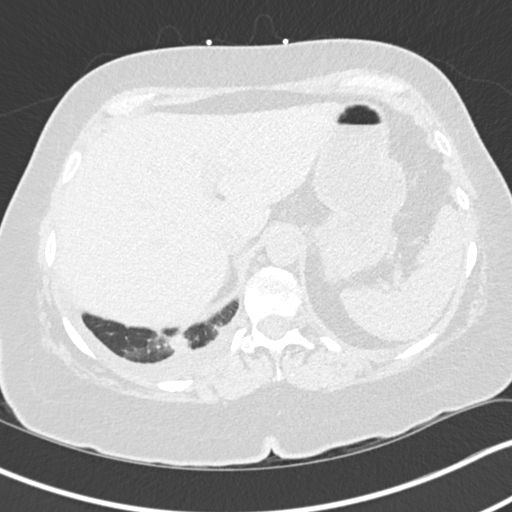
[im 39/150  lung]
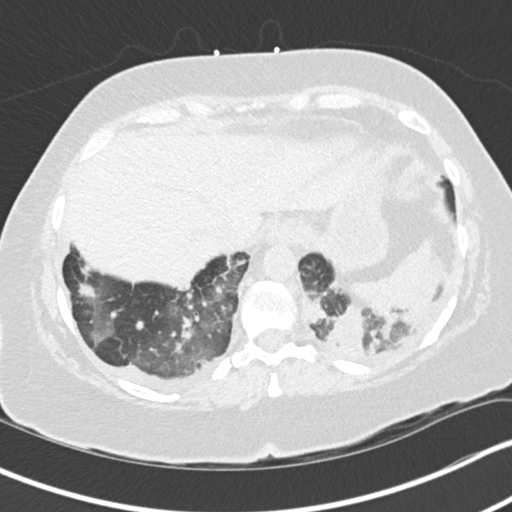
[im 52/150  lung]
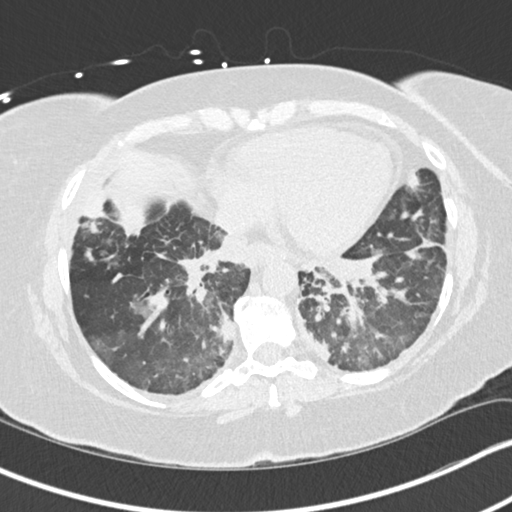
[im 65/150  mediastinal]
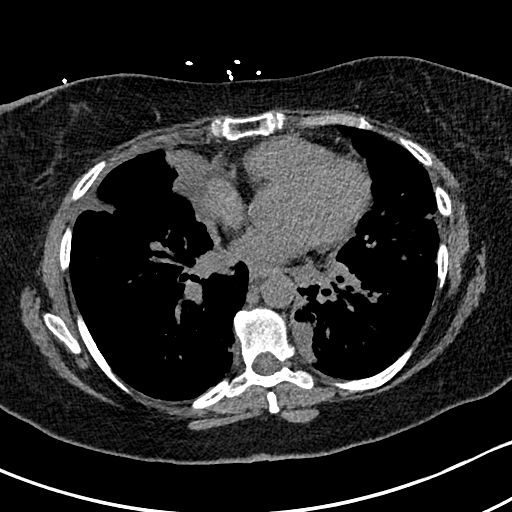
[im 65/150  lung]
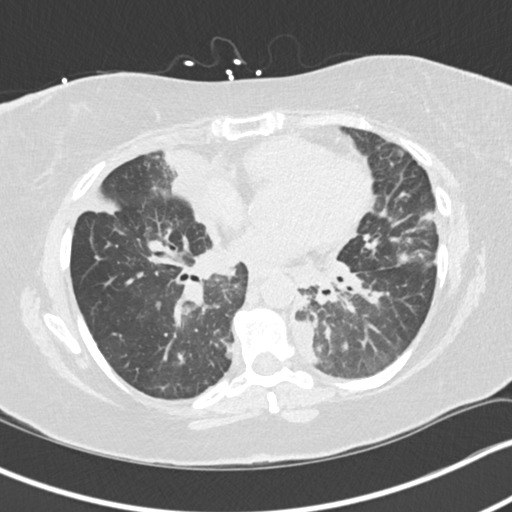
[im 78/150  lung]
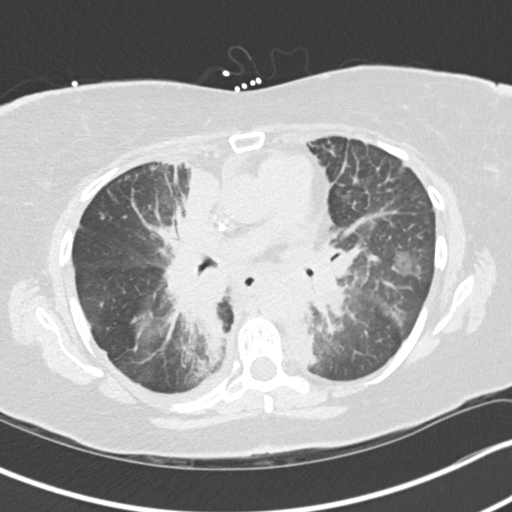
[im 91/150  lung]
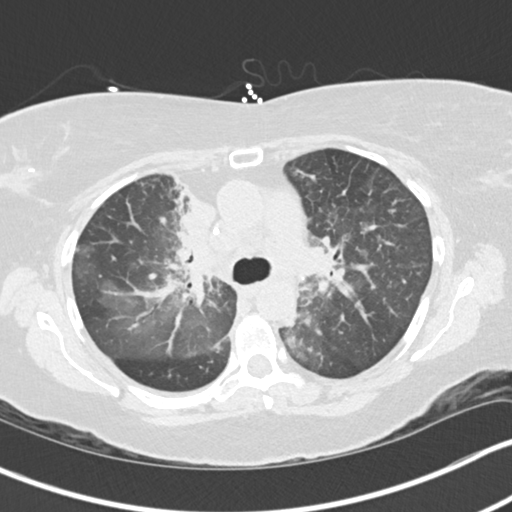
[im 104/150  lung]
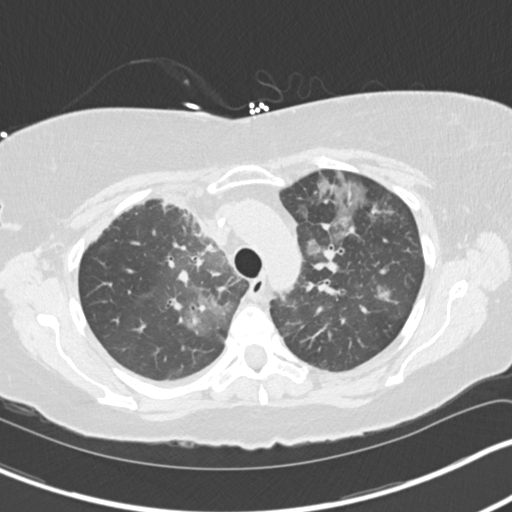
[im 117/150  mediastinal]
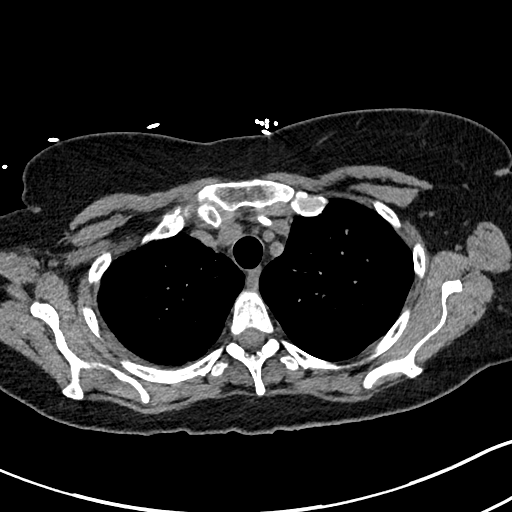
[im 117/150  lung]
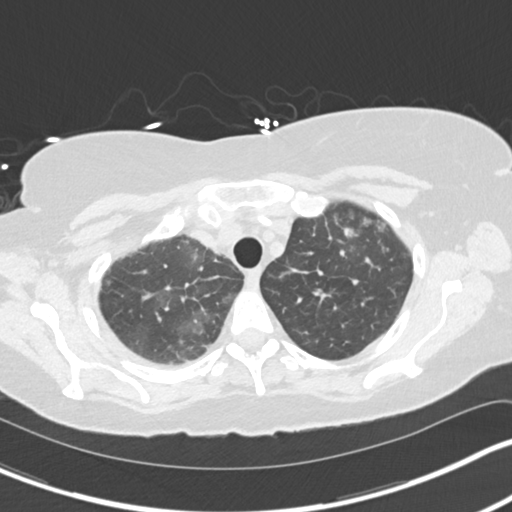
[im 130/150  lung]
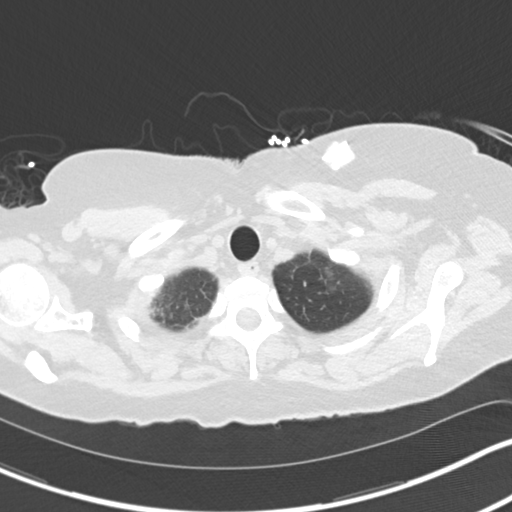
[im 143/150  lung]
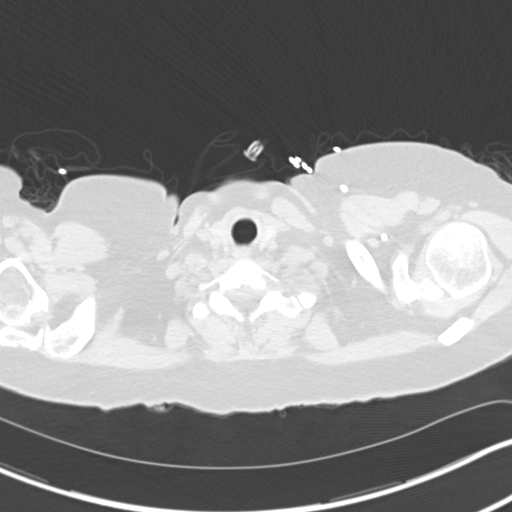

[Series 8: coronal · coronal · 0.64mm/px · 3 of 92 slices shown]
[im 19/92  lung]
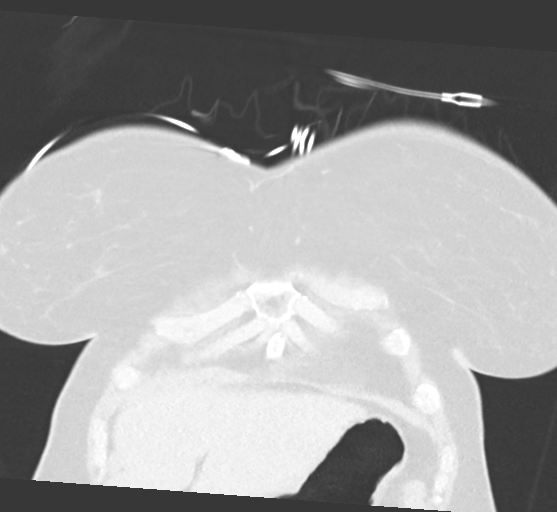
[im 37/92  lung]
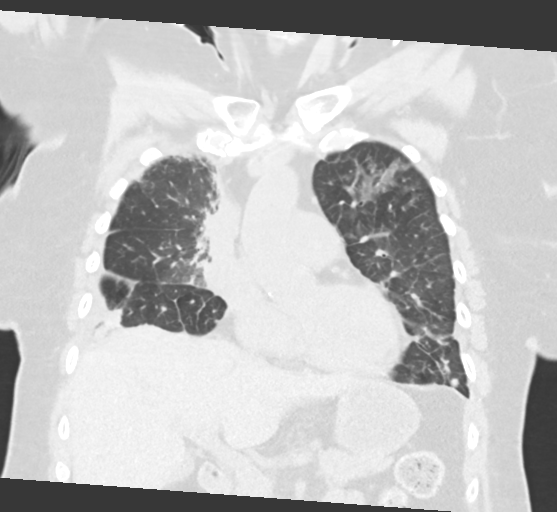
[im 55/92  lung]
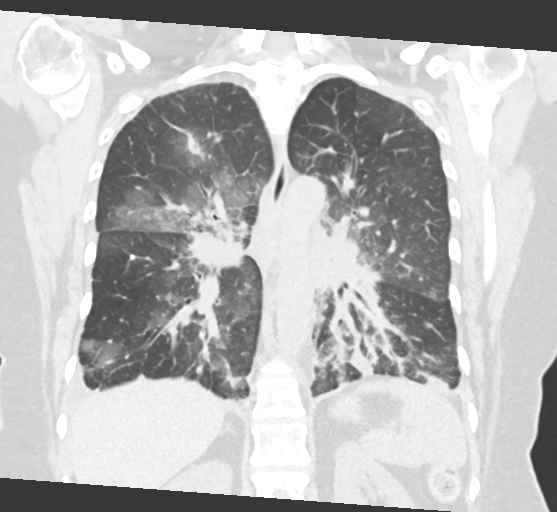

[14 of 36 positions shown; findings below may reference images not displayed]

FINDINGS: Cardiovascular: Normal heart size. Trace pericardial
effusion/thickening is stable. Left subclavian Port-A-Cath
terminates at the cavoatrial junction. Mildly atherosclerotic
nonaneurysmal thoracic aorta. Dilated main pulmonary artery (3.5 cm
diameter).

Mediastinum/Nodes: No discrete thyroid nodules. Unremarkable
esophagus. Surgical clips again noted in the right axilla. No
pathologically enlarged axillary lymph nodes. No discrete
mediastinal adenopathy.

Lungs/Pleura: No pneumothorax. Small dependent right and trace
dependent left pleural effusions are unchanged. There is extensive
patchy irregular thickening of the peribronchovascular interstitium
and interlobular septa throughout both lungs, most prominent in the
left lower lobe, which appears to be progressively worsening on
multiple chest CT studies back to 04/04/2019. Central perihilar
prominent interstitial thickening also appears progressively
increased back to 04/04/2019 CT. Patchy ground-glass opacities
throughout both lungs have worsened. Similarly, there is extensive
patchy sub solid pulmonary nodularity throughout both lungs with
progressive enlargement back to 07/09/2019 chest CT. Representative
basilar right lower lobe 1.8 cm nodule (series 3/image 127),
previously 0.6 cm on 07/09/2019 and 1.4 cm on 08/06/2019 CT.
Representative right upper lobe 1.1 cm nodule (series 3/image 41),
new. Medial left lower lobe 1.7 cm nodule (series 3/image 113),
previously 0.9 cm on 07/09/2019 and 1.1 cm on 08/06/2019. No
significant regions of bronchiectasis. No honeycombing. Moderate
patchy air trapping in both lungs on the expiration sequence.

Upper abdomen: No acute abnormality.

Musculoskeletal: No aggressive appearing focal osseous lesions. Mild
thoracic spondylosis.
IMPRESSION: 1. Extensive patchy irregular peribronchovascular and interlobular
septal thickening, patchy ground-glass opacities, subsolid
nodularity and irregular perihilar consolidation throughout both
lungs, all progressively worsening on multiple chest CT studies back
to 04/04/2019. Findings are most compatible with progressive
pulmonary metastatic disease due to breast cancer predominantly in a
lymphangitic carcinomatosis pattern.
2. Stable small dependent right and trace dependent left pleural
effusions.
3. Moderate patchy air trapping in both lungs, indicative of small
airways disease.
4. Dilated main pulmonary artery, suggesting pulmonary arterial
hypertension.
5. Aortic Atherosclerosis ([id number]).
